# Patient Record
Sex: Female | Born: 1935 | Race: White | Hispanic: No | Smoking: Current every day smoker
Health system: Southern US, Community
[De-identification: ages and names within clinical notes are randomized; demographics above are authoritative.]

## PROBLEM LIST (undated history)

## (undated) DIAGNOSIS — I714 Abdominal aortic aneurysm, without rupture: Secondary | ICD-10-CM

## (undated) DIAGNOSIS — I872 Venous insufficiency (chronic) (peripheral): Secondary | ICD-10-CM

## (undated) DIAGNOSIS — Z9889 Other specified postprocedural states: Secondary | ICD-10-CM

## (undated) DIAGNOSIS — C9110 Chronic lymphocytic leukemia of B-cell type not having achieved remission: Secondary | ICD-10-CM

## (undated) DIAGNOSIS — I739 Peripheral vascular disease, unspecified: Secondary | ICD-10-CM

## (undated) DIAGNOSIS — I10 Essential (primary) hypertension: Secondary | ICD-10-CM

## (undated) DIAGNOSIS — D649 Anemia, unspecified: Secondary | ICD-10-CM

## (undated) DIAGNOSIS — C439 Malignant melanoma of skin, unspecified: Secondary | ICD-10-CM

## (undated) DIAGNOSIS — E785 Hyperlipidemia, unspecified: Secondary | ICD-10-CM

## (undated) DIAGNOSIS — I509 Heart failure, unspecified: Secondary | ICD-10-CM

## (undated) DIAGNOSIS — F419 Anxiety disorder, unspecified: Secondary | ICD-10-CM

## (undated) DIAGNOSIS — Z972 Presence of dental prosthetic device (complete) (partial): Secondary | ICD-10-CM

## (undated) DIAGNOSIS — G43909 Migraine, unspecified, not intractable, without status migrainosus: Secondary | ICD-10-CM

## (undated) HISTORY — DX: Anemia, unspecified: D64.9

## (undated) HISTORY — DX: Malignant melanoma of skin, unspecified: C43.9

## (undated) HISTORY — DX: Other specified postprocedural states: Z98.890

## (undated) HISTORY — DX: Venous insufficiency (chronic) (peripheral): I87.2

## (undated) HISTORY — DX: Heart failure, unspecified: I50.9

## (undated) HISTORY — DX: Anxiety disorder, unspecified: F41.9

## (undated) HISTORY — DX: Peripheral vascular disease, unspecified: I73.9

## (undated) HISTORY — DX: Essential (primary) hypertension: I10

## (undated) HISTORY — DX: Abdominal aortic aneurysm, without rupture: I71.4

## (undated) HISTORY — DX: Chronic lymphocytic leukemia of B-cell type not having achieved remission: C91.10

## (undated) HISTORY — DX: Hyperlipidemia, unspecified: E78.5

---

## 1958-05-17 HISTORY — PX: TONSILLECTOMY AND ADENOIDECTOMY: SUR1326

## 1996-05-17 HISTORY — PX: KNEE ARTHROSCOPY: SUR90

## 2000-05-17 LAB — HM COLONOSCOPY

## 2006-05-17 HISTORY — PX: CARDIOVASCULAR STRESS TEST: SHX262

## 2007-10-31 ENCOUNTER — Ambulatory Visit: Payer: Self-pay | Admitting: Family Medicine

## 2009-01-10 ENCOUNTER — Ambulatory Visit: Payer: Self-pay | Admitting: Family Medicine

## 2009-06-03 ENCOUNTER — Encounter: Payer: Self-pay | Admitting: Internal Medicine

## 2009-06-23 ENCOUNTER — Ambulatory Visit: Payer: Self-pay | Admitting: Internal Medicine

## 2009-06-23 DIAGNOSIS — R079 Chest pain, unspecified: Secondary | ICD-10-CM | POA: Insufficient documentation

## 2009-06-23 DIAGNOSIS — F39 Unspecified mood [affective] disorder: Secondary | ICD-10-CM | POA: Insufficient documentation

## 2009-06-23 DIAGNOSIS — F4321 Adjustment disorder with depressed mood: Secondary | ICD-10-CM

## 2009-06-23 DIAGNOSIS — I10 Essential (primary) hypertension: Secondary | ICD-10-CM | POA: Insufficient documentation

## 2009-06-23 DIAGNOSIS — E785 Hyperlipidemia, unspecified: Secondary | ICD-10-CM | POA: Insufficient documentation

## 2009-06-24 ENCOUNTER — Encounter: Payer: Self-pay | Admitting: Internal Medicine

## 2009-06-25 LAB — CONVERTED CEMR LAB
ALT: 19 units/L (ref 0–35)
Alkaline Phosphatase: 73 units/L (ref 39–117)
BUN: 16 mg/dL (ref 6–23)
Basophils Absolute: 0.5 10*3/uL — ABNORMAL HIGH (ref 0.0–0.1)
Bilirubin, Direct: 0.1 mg/dL (ref 0.0–0.3)
Cholesterol: 165 mg/dL (ref 0–200)
Creatinine, Ser: 1.1 mg/dL (ref 0.4–1.2)
Direct LDL: 95.4 mg/dL
Eosinophils Relative: 1.1 % (ref 0.0–5.0)
GFR calc non Af Amer: 51.7 mL/min (ref >60)
Glucose, Bld: 93 mg/dL (ref 70–99)
Lymphs Abs: 7.7 10*3/uL — ABNORMAL HIGH (ref 0.7–4.0)
Monocytes Absolute: 0.8 10*3/uL (ref 0.1–1.0)
Monocytes Relative: 5.9 % (ref 3.0–12.0)
Neutrophils Relative %: 31.4 % — ABNORMAL LOW (ref 43.0–77.0)
Platelets: 120 10*3/uL — ABNORMAL LOW (ref 150.0–400.0)
RDW: 13.3 % (ref 11.5–14.6)
Sodium: 139 meq/L (ref 135–145)
TSH: 2.37 microintl units/mL (ref 0.35–5.50)
Total Protein: 6.4 g/dL (ref 6.0–8.3)
VLDL: 48.6 mg/dL — ABNORMAL HIGH (ref 0.0–40.0)
WBC: 13.3 10*3/uL — ABNORMAL HIGH (ref 4.5–10.5)

## 2009-07-14 ENCOUNTER — Telehealth: Payer: Self-pay | Admitting: Internal Medicine

## 2009-07-25 ENCOUNTER — Ambulatory Visit: Payer: Self-pay | Admitting: Internal Medicine

## 2010-02-14 ENCOUNTER — Ambulatory Visit: Payer: Self-pay | Admitting: Internal Medicine

## 2010-02-27 ENCOUNTER — Encounter: Payer: Self-pay | Admitting: Internal Medicine

## 2010-02-27 ENCOUNTER — Inpatient Hospital Stay: Payer: Self-pay | Admitting: Internal Medicine

## 2010-02-27 ENCOUNTER — Ambulatory Visit: Payer: Self-pay | Admitting: Cardiovascular Disease

## 2010-03-02 ENCOUNTER — Ambulatory Visit: Payer: Self-pay | Admitting: Internal Medicine

## 2010-03-02 ENCOUNTER — Encounter: Payer: Self-pay | Admitting: Internal Medicine

## 2010-03-09 ENCOUNTER — Encounter (INDEPENDENT_AMBULATORY_CARE_PROVIDER_SITE_OTHER): Payer: Self-pay | Admitting: *Deleted

## 2010-03-17 HISTORY — PX: DOBUTAMINE STRESS ECHO: SHX5426

## 2010-03-25 ENCOUNTER — Telehealth (INDEPENDENT_AMBULATORY_CARE_PROVIDER_SITE_OTHER): Payer: Self-pay | Admitting: *Deleted

## 2010-03-26 ENCOUNTER — Encounter: Payer: Self-pay | Admitting: Internal Medicine

## 2010-03-26 ENCOUNTER — Ambulatory Visit: Payer: Self-pay

## 2010-03-26 ENCOUNTER — Ambulatory Visit (HOSPITAL_COMMUNITY)
Admission: RE | Admit: 2010-03-26 | Discharge: 2010-03-26 | Payer: Self-pay | Source: Home / Self Care | Admitting: Internal Medicine

## 2010-03-26 ENCOUNTER — Ambulatory Visit: Payer: Self-pay | Admitting: Cardiology

## 2010-03-27 ENCOUNTER — Telehealth: Payer: Self-pay | Admitting: Internal Medicine

## 2010-04-02 ENCOUNTER — Ambulatory Visit: Payer: Self-pay | Admitting: Internal Medicine

## 2010-04-02 DIAGNOSIS — I872 Venous insufficiency (chronic) (peripheral): Secondary | ICD-10-CM | POA: Insufficient documentation

## 2010-04-02 DIAGNOSIS — D649 Anemia, unspecified: Secondary | ICD-10-CM

## 2010-04-03 ENCOUNTER — Encounter: Payer: Self-pay | Admitting: Internal Medicine

## 2010-04-06 LAB — CONVERTED CEMR LAB
Albumin: 4.4 g/dL (ref 3.5–5.2)
BUN: 23 mg/dL (ref 6–23)
Chloride: 102 meq/L (ref 96–112)
Creatinine, Ser: 1.2 mg/dL (ref 0.4–1.2)
GFR calc non Af Amer: 48.53 mL/min (ref >60)
Glucose, Bld: 104 mg/dL — ABNORMAL HIGH (ref 70–99)
MCHC: 30.9 g/dL (ref 30.0–36.0)
Phosphorus: 3.7 mg/dL (ref 2.3–4.6)
RDW: 15.4 % (ref 11.5–15.5)

## 2010-05-06 ENCOUNTER — Encounter: Payer: Self-pay | Admitting: Internal Medicine

## 2010-05-06 ENCOUNTER — Ambulatory Visit: Payer: Self-pay | Admitting: Internal Medicine

## 2010-05-17 DIAGNOSIS — I714 Abdominal aortic aneurysm, without rupture, unspecified: Secondary | ICD-10-CM

## 2010-05-17 DIAGNOSIS — I739 Peripheral vascular disease, unspecified: Secondary | ICD-10-CM

## 2010-05-17 HISTORY — DX: Abdominal aortic aneurysm, without rupture, unspecified: I71.40

## 2010-05-17 HISTORY — DX: Abdominal aortic aneurysm, without rupture: I71.4

## 2010-05-17 HISTORY — DX: Peripheral vascular disease, unspecified: I73.9

## 2010-06-16 NOTE — Letter (Signed)
Summary: Appointment - [place], [place]  [address] [place]   [place], [place] [address]   Phone: [phone]  Fax: [phone]     March 09, 2010 MRN: [id number]   Tiffany Martinez [address] [place], [place]  [address]   Dear Ms. Joost,   Due to a change in our office schedule, your appointment on 03-18-10 at 11:30 must be changed to 03-26-10 at 2:00.  It is very important that we reach you to reschedule this appointment. We look forward to participating in your health care needs. Please contact us at the number listed above at your earliest convenience to reschedule this appointment.     Sincerely,  Designer, fashion/clothing

## 2010-06-16 NOTE — Miscellaneous (Signed)
Summary: Do Not Resuscitate Order  Do Not Resuscitate Order   Imported By: Virgia Land 06/24/2009 09:57:32  _____________________________________________________________________  External Attachment:    Type:   Image     Comment:   External Document

## 2010-06-16 NOTE — Progress Notes (Signed)
Summary: Stress Echo Pre-Procedure  Phone Note Outgoing Call Call back at [facility] Phone [phone]   Call placed by: Hubbard Robinson RN,  March 25, 2010 1:58 PM Call placed to: Patient Reason for Call: Confirm/change Appt Summary of Call: Stress Echo instructions given to patient.

## 2010-06-16 NOTE — Progress Notes (Signed)
Summary: requests alprazolam  Phone Note Call from Patient Call back at Home Phone [phone]   Caller: Patient Call For: Claris Gower MD Summary of Call: Pt states her husband passed away this weekend and she is asking if she can get a refill on alprazolam, she only wants 4 or 5.  [facility] [address]. Initial call taken by: Marty Heck CMA,  July 14, 2009 9:41 AM  Follow-up for Phone Call        okay to send Rx for #30 x 0 Follow-up by: Claris Gower MD,  July 14, 2009 10:33 AM  Additional Follow-up for Phone Call Additional follow up Details #1::        Rx Called In, Spoke with patient and advised results.  Additional Follow-up by: Edwin Dada CMA (AAMA),  July 14, 2009 10:45 AM    New/Updated Medications: ALPRAZOLAM 0.25 MG TABS (ALPRAZOLAM) 1 three times a day as needed for nerves Prescriptions: ALPRAZOLAM 0.25 MG TABS (ALPRAZOLAM) 1 three times a day as needed for nerves  #30 x 0   Entered by:   Edwin Dada CMA ([place])   Authorized by:   Claris Gower MD   Signed by:   Edwin Dada CMA ([place]) on 07/14/2009   Method used:   Telephoned to ...       [facility] [address] [phone]* (retail)       [address] [place], [place]  [id number]       Ph: [phone]       Fax: [phone]   [place]:   [phone]

## 2010-06-16 NOTE — Letter (Signed)
Summary: [facility] - Chest Pain  [facility] - Chest Pain   Imported By: Laural Benes 03/06/2010 15:27:38  _____________________________________________________________________  External Attachment:    Type:   Image     Comment:   External Document

## 2010-06-16 NOTE — Progress Notes (Signed)
Summary: Lab letter  Phone Note Call from Patient Call back at Home Phone [phone]   Caller: Patient Call For: Claris Gower MD Summary of Call: Pt called back to ask if Dr.Letvak could do another lab test that was done at [facility], per the pt she received a letter from [facility] that because of her abnormal labs they made her an appt with the cancer Dr.? pt was very upset because she never knew about this appt or abnormal labs, pt would like for Dr.Letvak to look over the labs from the hospital and advise if they need to be repeated? pt states her RBC & WBC were abnormal per letter from hospital. Pt has appt on 04/02/2010 with Dr.Letvak can those labs be repeated then? I also advised pt to bring letter. Initial call taken by: Edwin Dada CMA Deborra Medina),  March 27, 2010 9:49 AM  Follow-up for Phone Call        Please let her know there were mild abnormalities in the blood count We will repeat these and then decide if further evaluation is indicated Follow-up by: Claris Gower MD,  March 27, 2010 10:24 AM  Additional Follow-up for Phone Call Additional follow up Details #1::        left message on machine with results Additional Follow-up by: Edwin Dada CMA Deborra Medina),  March 27, 2010 12:36 PM

## 2010-06-16 NOTE — Assessment & Plan Note (Signed)
Summary: F/U/CLE   Vital Signs:  Patient profile:   77 year old female Weight:      161 pounds Temp:     98.0 degrees F oral Pulse rate:   64 / minute Pulse rhythm:   regular BP sitting:   130 / 60  (left arm) Cuff size:   regular  Vitals Entered By: Edwin Dada CMA Deborra Medina) (March 02, 2010 4:09 PM) CC: hospital follow-up   History of Present Illness: Seen in ER at [facility] last Tuesday Had chest pain and they wanted to admit him Dr Rockey Situ saw her there and let her go home  Only seems to be happening at night in bed occ in evening when relaxed in chair  Had gone to sleep and woke with worsening pressure in chest took a couple of aspirin and tried to relax Had some milder spells before this she was frightened so she drove herself to ER but the pain was gone by the time she got home Had CXR and labs WBC elevated, potassium low BP was very high also but then settled down  Just moved 3 weeks ago packed up house and moved to [facility] with lots of activity but didn't have pain then  Still grieving for husband hopes to have more socialization now   Allergies: No Known Drug Allergies  Past History:  Past medical, surgical, family and social histories (including risk factors) reviewed for relevance to current acute and chronic problems.  Past Medical History: Reviewed history from 06/23/2009 and no changes required. Anxiety Hyperlipidemia Hypertension  Past Surgical History: Reviewed history from 06/23/2009 and no changes required. Tonsillectomy/adenoids  1960 Right knee arthroscopy --1998 Stress test negative --2008  Family History: Reviewed history from 06/23/2009 and no changes required. Mom died of Altzheimers @96  Dad died @58  of pancreatic cancer 2 brothers with hypercholesterolemia No CAD, DM No breast cancer or colon cancer  Social History: Reviewed history from 07/25/2009 and no changes required. Widowed 2/11 -2nd for him , 1st for her. 4 step  sons. 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 Requests DNR--papers done Requests no feeding tube  Review of Systems       no heartburn No regurgitation does get heavy sweat with the spells  Physical Exam  General:  alert and normal appearance.   Neck:  supple, no masses, no thyromegaly, no carotid bruits, and no cervical lymphadenopathy.   Lungs:  normal respiratory effort, no intercostal retractions, no accessory muscle use, and normal breath sounds.   Heart:  normal rate, regular rhythm, no murmur, and no gallop.   Abdomen:  soft, non-tender, and no masses.   Pulses:  1+ in feet Extremities:  no edema Psych:  normally interactive, good eye contact, not anxious appearing, and not depressed appearing.     Impression & Recommendations:  Problem # 1:  CHEST PAIN (ICD-786.50) Assessment Comment Only  recurrent spells briefly admitted---records reveiwed atypical but worrisome with diaphoresis and left side pressure happening at rest doesn't seem to be reflux either  P: will check stress echo    no new meds     will try maalox and then NTG if recurs  Orders: EKG w/ Interpretation (93000) Echo Referral (Echo)  Problem # 2:  HYPERTENSION (ICD-401.9) Assessment: Unchanged potassium 3.2 in hospital  will add triamterene  The following medications were  removed from the medication list:    Hydrochlorothiazide 25 Mg Tabs (Hydrochlorothiazide) .Marland Kitchen... Take 1 by mouth once daily Her updated medication list for this problem includes:    Triamterene-hctz 37.5-25 Mg Tabs (Triamterene-hctz) .Marland Kitchen... 1 tab daily for high blood pressure  BP today: 130/60 Prior BP: 120/68 (07/25/2009)  Labs Reviewed: K+: 3.5 (06/23/2009) Creat: : 1.1 (06/23/2009)   Chol: 165 (06/23/2009)   HDL: 49.60 (06/23/2009)    TG: 243.0 (06/23/2009)  Complete Medication List: 1)  Triamterene-hctz 37.5-25 Mg Tabs (Triamterene-hctz) .Marland Kitchen.. 1 tab daily for high blood pressure 2)  Lipitor 20 Mg Tabs (Atorvastatin calcium) .... Take 1 by mouth once daily 3)  Aspirin 81 Mg Tabs (Aspirin) .... Take 1 by mouth once daily 4)  Alprazolam 0.25 Mg Tabs (Alprazolam) .Marland Kitchen.. 1 three times a day as needed for nerves 5)  Nitrostat 0.4 Mg Subl (Nitroglycerin) .... Use as directed  Patient Instructions: 1)  Please change the HCTZ to the combination med that has the triamterene in it 2)  Please set up stress echocardiogram 3)  Please schedule a follow-up appointment in 1 month.  Prescriptions: TRIAMTERENE-HCTZ 37.5-25 MG TABS (TRIAMTERENE-HCTZ) 1 tab daily for high blood pressure  #30 x 12   Entered and Authorized by:   Claris Gower MD   Signed by:   Claris Gower MD on 03/02/2010   Method used:   Electronically to        [facility]. [address] [phone]* (retail)       [address] [place], [place]  [id number]       Ph: [phone]       Fax: [phone]   RxID:   [id number]    Orders Added: 1)  Est. Patient Level IV [id number] 2)  EKG w/ Interpretation [93000] 3)  Echo Referral [Echo]    Current Allergies (reviewed today): No known allergies

## 2010-06-16 NOTE — Assessment & Plan Note (Signed)
Summary: 2:00 NEW PT TO EST/[place]/CLE   Vital Signs:  Patient profile:   77 year old female Height:      64.5 inches Weight:      163 pounds BMI:     27.65 Temp:     97.8 degrees F oral Pulse rate:   60 / minute Pulse rhythm:   regular BP sitting:   130 / 60  (left arm) Cuff size:   regular  Vitals Entered By: Edwin Dada CMA Deborra Medina) (June 23, 2009 2:02 PM) CC: new patient to establish care   History of Present Illness: Eatablishing here--tranferring since I go to [place] her recently when assuming her husband's care at [facility]  Has had stress with her care for him  Now in facility but still stressed due to his trouble acclimating She is having a hard time with this Has lost all her responsibilites Really appreciative of the good care of staff but finds herself crying a lot No history of depression in past Appetite is off has some degree of anhedonia Got some xanax from Dr Lovie Macadamia but not really helping Feels realistic about his decline having trouble taking care of taxes and other financial issues Plans to move to smaller, sunnier unit at [place] with step sons They don't really realize what is going on with him or what is required for his care  HTN diagnosed in past few years very labile with stress Has been getting tightening in chest --always while lying in bed. Right side Did stress test from Dr Lovie Macadamia about 2 yearss ago---negative  Hypercholesterolemia for some years on lipitor   Preventive Screening-Counseling & Management  Alcohol-Tobacco     Smoking Status: current  Allergies (verified): No Known Drug Allergies  Past History:  Past Medical History: Anxiety Hyperlipidemia Hypertension  Past Surgical History: Tonsillectomy/adenoids  1960 Right knee arthroscopy --August 31, 1996 Stress test negative --2006/09/01  Family History: Mom died of Altzheimers @96  Dad died @58  of pancreatic cancer 2 brothers with  hypercholesterolemia No CAD, DM No breast cancer or colon cancer  Social History: Married--2nd for him , 1st for her. 4 sons. 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 Requests DNR--papers done Requests no feeding tubeSmoking Status:  current  Review of Systems General:  generally sleeping okay wears seat belt appetite isn't great--weight seems to be stable or has lost a little. Eyes:  Denies double vision and vision loss-1 eye. ENT:  Denies decreased hearing and ringing in ears; full dentures. CV:  See HPI; Complains of chest pain or discomfort; denies fainting, lightheadness, palpitations, shortness of breath with exertion, and swelling of feet. Resp:  Denies cough and shortness of breath. GI:  Denies abdominal pain, bloody stools, change in bowel habits, dark tarry stools, indigestion, nausea, and vomiting. GU:  Denies dysuria and incontinence. MS:  Complains of joint pain; denies joint swelling; occ knee aching--no meds. Derm:  Complains of lesion(s); denies rash; has lump on back of neck does see dermatologists. Neuro:  Denies headaches, numbness, tingling, and weakness. Psych:  See HPI; Complains of anxiety and depression. Heme:  Denies abnormal bruising and enlarge lymph nodes.  Physical Exam  General:  alert and normal appearance.   Head:  normocephalic and atraumatic.   Eyes:  pupils equal, pupils round, and pupils reactive to  light.   Mouth:  no erythema and no exudates.   Full dentures in  Neck:  supple, no masses, no thyromegaly, no carotid bruits, and no cervical lymphadenopathy.   Lungs:  normal respiratory effort, no intercostal retractions, no accessory muscle use, and normal breath sounds.   Heart:  normal rate, regular rhythm, no murmur, and no gallop.   Abdomen:  soft,  non-tender, and no masses.   Msk:  no joint tenderness and no joint swelling.   Pulses:  2+ in feet Extremities:  no edema Neurologic:  alert & oriented X3, strength normal in all extremities, and gait normal.   Skin:  no rashes, no suspicious lesions, and no ulcerations.  Small freely moveable cyst over cervical spine Axillary Nodes:  No palpable lymphadenopathy Psych:  normally interactive, good eye contact, not anxious appearing, and dysphoric affect.     Impression & Recommendations:  Problem # 1:  ADJUSTMENT DISORDER WITH DEPRESSED MOOD (ICD-309.0) Assessment New clearly with some depression seems  to be improving to some extent---has started cooking and painting some---but still not socially engaged  P: will give lorazepam for occ use when very stressed    see back in 1 month--consider SSRI if not improving then (or buproprion if not able to stay away from cigarettes)  Problem # 2:  CHEST PAIN (ICD-786.50) Assessment: Comment Only  occ squeezing--but not exertional may be stress related no change since before her stress test which was reassuring  Orders: EKG w/ Interpretation (93000) TLB-Renal Function Panel (80069-RENAL) TLB-CBC Platelet - w/Differential (85025-CBCD) TLB-TSH (Thyroid Stimulating Hormone) (84443-TSH) Venipuncture [id number])  Problem # 3:  HYPERTENSION (ICD-401.9) Assessment: Unchanged will check labs no change needed  Her updated medication list for this problem includes:    Hydrochlorothiazide 25 Mg Tabs (Hydrochlorothiazide) .Marland Kitchen... Take 1 by mouth once daily  BP today: 130/60  Problem # 4:  HYPERLIPIDEMIA (ICD-272.4) Assessment: Unchanged  discussed the uncertainty about primary prevention will check levels and review this at subsequent visits  Her updated medication list for this problem includes:    Lipitor 20 Mg Tabs (Atorvastatin calcium) .Marland Kitchen... Take 1 by mouth once daily  Orders: TLB-Lipid Panel (80061-LIPID) TLB-Hepatic/Liver Function  Pnl (80076-HEPATIC)  Complete Medication List: 1)  Hydrochlorothiazide 25 Mg Tabs (Hydrochlorothiazide) .... Take 1 by mouth once daily 2)  Lipitor 20 Mg Tabs (Atorvastatin calcium) .... Take 1 by mouth once daily 3)  Aspirin 81 Mg Tabs (Aspirin) .... Take 1 by mouth once daily 4)  Alprazolam 0.25 Mg Tabs (Alprazolam) .... As needed  Patient Instructions: 1)  Please get last 3 years of records from [facility] 2)  Please schedule a follow-up appointment in 1 month.   Current Allergies (reviewed today): No known allergies    Tetanus/Td Immunization History:    Tetanus/Td # 1:  Historical (05/18/2003)  Other Immunization History:    Zostavax # 1:  Zostavax (05/17/2008)   Colonoscopy  Procedure date:  05/17/2000  Findings:      had some benign polyps may have been adenomatous  EKG  Procedure date:  06/23/2009  Findings:      sinus @61  normal variant

## 2010-06-16 NOTE — Letter (Signed)
Summary: Records Dated 06-28-06 thru 09-24-08/[facility]  Records Dated 06-28-06 thru 09-24-08/[facility]   Imported By: Edmonia James 07/22/2009 09:53:03  _____________________________________________________________________  External Attachment:    Type:   Image     Comment:   External Document

## 2010-06-16 NOTE — Assessment & Plan Note (Signed)
Summary: 1 MONTH FOLLOW UP/[facility]   Vital Signs:  Patient profile:   77 year old female Weight:      163 pounds Temp:     97.7 degrees F oral Pulse rate:   64 / minute Pulse rhythm:   regular BP sitting:   120 / 68  (left arm) Cuff size:   regular  Vitals Entered By: Edwin Dada CMA Deborra Medina) (July 25, 2009 10:11 AM) CC: 1 month follow-up   History of Present Illness: Husband died in past few weeks Funeral went okay did use the tranquilizer for this  BP has been okay Enjoying some quiet and peace now Working through finances, etc with youngest step son they are working through the process--have stopped with the recriminations  Had relatives affected by earthquake in [place] 2 nieces were killed she is working through this as well   Allergies: No Known Drug Allergies  Past History:  Past medical, surgical, family and social histories (including risk factors) reviewed for relevance to current acute and chronic problems.  Past Medical History: Reviewed history from 06/23/2009 and no changes required. Anxiety Hyperlipidemia Hypertension  Past Surgical History: Reviewed history from 06/23/2009 and no changes required. Tonsillectomy/adenoids  1960 Right knee arthroscopy --1998 Stress test negative --2008  Family History: Reviewed history from 06/23/2009 and no changes required. Mom died of Altzheimers @96  Dad died @58  of pancreatic cancer 2 brothers with hypercholesterolemia No CAD, DM No breast cancer or colon cancer  Social History: Widowed 2/11 -2nd for him , 1st for her. 4 step sons. 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 Requests DNR--papers done Requests no feeding tube  Review of Systems       still trouble initiating sleep uses hot bath and occ exedrin  PM weight stable   Impression & Recommendations:  Problem # 1:  ADJUSTMENT DISORDER WITH DEPRESSED MOOD (ICD-309.0) Assessment Improved has dealt with his death okay counselled for all of 30 minute visit  Problem # 2:  HYPERTENSION (ICD-401.9) Assessment: Unchanged good control no changes needed  Her updated medication list for this problem includes:    Hydrochlorothiazide 25 Mg Tabs (Hydrochlorothiazide) .Marland Kitchen... Take 1 by mouth once daily  BP today: 120/68 Prior BP: 130/60 (06/23/2009)  Labs Reviewed: K+: 3.5 (06/23/2009) Creat: : 1.1 (06/23/2009)   Chol: 165 (06/23/2009)   HDL: 49.60 (06/23/2009)   TG: 243.0 (06/23/2009)  Problem # 3:  HYPERLIPIDEMIA (ICD-272.4) Assessment: Unchanged good control no changes  Her updated medication list for this problem includes:    Lipitor 20 Mg Tabs (Atorvastatin calcium) .Marland Kitchen... Take 1 by mouth once daily  Labs Reviewed: SGOT: 23 (06/23/2009)   SGPT: 19 (06/23/2009)   HDL:49.60 (06/23/2009)  Chol:165 (06/23/2009)  Trig:243.0 (06/23/2009)  Complete Medication List: 1)  Hydrochlorothiazide 25 Mg Tabs (Hydrochlorothiazide) .... Take 1 by mouth once daily 2)  Lipitor 20 Mg Tabs (Atorvastatin calcium) .... Take 1 by mouth once daily 3)  Aspirin 81 Mg Tabs (Aspirin) .... Take 1 by mouth once daily 4)  Alprazolam 0.25 Mg Tabs (Alprazolam) .Marland Kitchen.. 1 three times a day as needed for nerves  Patient Instructions: 1)  Please schedule a follow-up appointment in 3 months .   Current Allergies (reviewed today): No known allergies    Immunization History:  Tetanus/Td Immunization History:    Tetanus/Td:  Historical (05/18/2003)  Pneumovax Immunization  History:    Pneumovax:  Historical (05/18/2003)  Zostavax History:    Zostavax # 1:  Zostavax (08/30/2008)

## 2010-06-16 NOTE — Miscellaneous (Signed)
Summary: Declaration of a Desire for a Natural Death  Declaration of a Desire for a Natural Death   Imported By: Virgia Land 06/24/2009 09:56:28  _____________________________________________________________________  External Attachment:    Type:   Image     Comment:   External Document

## 2010-06-16 NOTE — Miscellaneous (Signed)
Summary: Health Care Power of [place] of Attorney   Imported By: Virgia Land 06/24/2009 09:58:29  _____________________________________________________________________  External Attachment:    Type:   Image     Comment:   External Document

## 2010-06-16 NOTE — Assessment & Plan Note (Signed)
Summary: ONE MTH FOLLOW UP / LFW   Vital Signs:  Patient profile:   77 year old female Weight:      162 pounds BMI:     27.48 Temp:     97.6 degrees F oral BP sitting:   128 / 62  (left arm) Cuff size:   regular  Vitals Entered By: Edwin Dada CMA Deborra [place]) (April 02, 2010 2:38 PM) CC: 1 month follow-up   History of Present Illness: Did great on the stress echocardiogram Had good exercise tolerance and negative exam  Has not had any pain since ER visit No sig heartburn problems May have been a muscle spasm process  Adjusting well to new [place]--even throwing a party for some birthdays still "takes a long time getting used to being on your own"  Decided to defer hematology eval mildly low platelets and hgb has history of clustering if she has heparin in sample though Chronic anemia  Allergies: No Known Drug Allergies  Past History:  Past medical, surgical, family and social histories (including risk factors) reviewed for relevance to current acute and chronic problems.  Past Medical History: Anxiety Hyperlipidemia Hypertension Anemia-NOS Chronic venous insufficiency  Past Surgical History: Tonsillectomy/adenoids  1960 Right knee arthroscopy --1998 Stress test negative --2008 Stress echo negative 11/11  Family History: Reviewed history from 06/23/2009 and no changes required. Mom died of Altzheimers @96  Dad died @58  of pancreatic cancer 2 brothers with hypercholesterolemia No CAD, DM No breast cancer or colon cancer  Social History: Reviewed history from 07/25/2009 and no changes required. Widowed 2/11 -2nd for him , 1st for her. 4 step sons. 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 Requests DNR--papers  done Requests no feeding tube  Review of Systems       eating well weight is stable right calf still turns purplish by the end of the day. No sig pain  Physical Exam  General:  alert and normal appearance.   Neck:  supple, no masses, no thyromegaly, and no cervical lymphadenopathy.   Lungs:  normal respiratory effort, no intercostal retractions, no accessory muscle use, and normal breath sounds.   Heart:  normal rate, regular rhythm, no murmur, and no gallop.   Extremities:  trace edema right ankle mild venous  dilatation Psych:  normally interactive, good eye contact, not anxious appearing, and not depressed appearing.   teary when speaking about husband   Impression & Recommendations:  Problem # 1:  CHEST PAIN (ICD-786.50) Assessment Improved  resolved stress test reassuring no recurrent symptoms  Orders: Venipuncture [id number])  Problem # 2:  ANEMIA-NOS (R2654735.9) Assessment: Comment Only  chronic with mildly low platelets  P: will recheck    referral only if worsens  Orders: TLB-CBC Platelet - w/Differential (85025-CBCD) Venipuncture [id number])  Problem # 3:  HYPERTENSION (ICD-401.9) Assessment: Unchanged  good control  Her updated medication list for this problem includes:    Triamterene-hctz 37.5-25 Mg Tabs (Triamterene-hctz) .Marland Kitchen... 1 tab daily for high blood pressure  BP today: 128/62 Prior BP: 130/60 (03/02/2010)  Labs Reviewed: K+: 3.5 (06/23/2009) Creat: : 1.1 (06/23/2009)   Chol: 165 (06/23/2009)   HDL: 49.60 (06/23/2009)   TG: 243.0 (06/23/2009)  Orders: TLB-Renal Function Panel (80069-RENAL) Venipuncture [id number])  Complete Medication List: 1)  Triamterene-hctz 37.5-25 Mg Tabs (Triamterene-hctz) .Marland KitchenMarland KitchenMarland Kitchen 1  tab daily for high blood pressure 2)  Lipitor 20 Mg Tabs (Atorvastatin calcium) .... Take 1 by mouth once daily 3)  Aspirin 81 Mg Tabs (Aspirin) .... Take 1 by mouth once daily 4)  Alprazolam 0.25 Mg Tabs (Alprazolam) .Marland Kitchen.. 1 three times a day as  needed for nerves 5)  Nitrostat 0.4 Mg Subl (Nitroglycerin) .... Use as directed  Patient Instructions: 1)  Please schedule a follow-up appointment in 6 months .    Orders Added: 1)  TLB-CBC Platelet - w/Differential [85025-CBCD] 2)  TLB-Renal Function Panel [80069-RENAL] 3)  Venipuncture [id number]    Current Allergies (reviewed today): No known allergies   Appended Document: ONE MTH FOLLOW UP / LFW    Clinical Lists Changes  Orders: Added new Service order of Flu Vaccine 37yrs + MEDICARE PATIENTS [id number]) - Signed Added new Service order of Administration Flu vaccine - MCR [id number]) - Signed Observations: Added new observation of FLU VAX VIS: 12/09/2009 version (04/02/2010 15:34) Added new observation of FLU VAXLOT: [id number] (04/02/2010 15:34) Added new observation of FLU VAXMFR: Glaxosmithkline (04/02/2010 15:34) Added new observation of FLU VAX EXP: 11/14/2010 (04/02/2010 15:34) Added new observation of FLU VAX DSE: 0.80ml (04/02/2010 15:34) Added new observation of FLU VAX: Fluvax 3+ (04/02/2010 15:34) Flu Vaccine Consent Questions     Do you have a history of severe allergic reactions to this vaccine? no    Any prior history of allergic reactions to egg and/or gelatin? no    Do you have a sensitivity to the preservative Thimersol? no    Do you have a past history of Guillan-Barre Syndrome? no    Do you currently have an acute febrile illness? no    Have you ever had a severe reaction to latex? no    Vaccine information given and explained to patient? yes    Are you currently pregnant? no    Lot Number:[id number]   Exp Date:11/14/2010   Site Given  Right Deltoid IM   .[email or web address]

## 2010-06-16 NOTE — Letter (Signed)
Summary: [facility]   Imported By: Sallee Provencal 03/04/2010 17:11:00  _____________________________________________________________________  External Attachment:    Type:   Image     Comment:   External Document

## 2010-06-16 NOTE — Miscellaneous (Signed)
Summary: Appointment Canceled  Appointment status changed to canceled by [employer] on 03/09/2010 2:08 PM.  Cancellation Comments --------------------- ecs/cp/insur:medicare/tricare-mb  Appointment Information ----------------------- Appt Type:  CARDIOLOGY ANCILLARY VISIT      Date:  Wednesday, March 18, 2010      Time:  11:30 AM for 60 min   Urgency:  Routine   Made By:  Wynelle Cleveland  To Visit:  [id number]    Reason:  ecs/cp/insur:medicare/tricare-mb  Appt Comments ------------- -- 03/09/10 14:08: ([facility]) CANCELED -- ecs/cp/insur:medicare/tricare-mb -- 03/03/10 16:40: ([facility]) BOOKED -- Routine CARDIOLOGY ANCILLARY VISIT at 03/18/2010 11:30 AM for 60 min ecs/cp/insur:medicare/tricare-mb

## 2010-06-18 NOTE — Assessment & Plan Note (Signed)
Summary: CONGESTION/ lb   Vital Signs:  Patient profile:   77 year old female Weight:      164 pounds O2 Sat:      99 % on Room air Temp:     97.7 degrees F oral Pulse rate:   68 / minute Pulse rhythm:   regular Resp:     14 per minute BP sitting:   128 / 70  (left arm) Cuff size:   regular  Vitals Entered By: Edwin Dada CMA Deborra [place]) (May 06, 2010 12:25 PM)  O2 Flow:  Room air CC: congestion   History of Present Illness: Has been sick had to spend most of the day in bed yesterday Had mild cold starting about 3 weeks ago Felt she had gotten better but now comes back intermittently seems to be down in her chest She is fatiguing easier  Intermittent fever SOme hot sensation at night, then resolves  Lots of cough---clear sputum some nasal congestion and post nasal drip Some DOE easier--like on her daily walks  No ear pain some soreness in neck glands and posterior neck Slight sore throat  tried mucinex--may have helped some  Allergies: No Known Drug Allergies  Past History:  Past medical, surgical, family and social histories (including risk factors) reviewed for relevance to current acute and chronic problems.  Past Medical History: Reviewed history from 04/02/2010 and no changes required. Anxiety Hyperlipidemia Hypertension Anemia-NOS Chronic venous insufficiency  Past Surgical History: Reviewed history from 04/02/2010 and no changes required. Tonsillectomy/adenoids  1960 Right knee arthroscopy --1998 Stress test negative --2008 Stress echo negative 11/11  Family History: Reviewed history from 06/23/2009 and no changes required. Mom died of Altzheimers @96  Dad died @58  of pancreatic cancer 2 brothers with hypercholesterolemia No CAD, DM No breast cancer or colon cancer  Social History: Reviewed history from 07/25/2009 and no changes required. Widowed 2/11 -2nd for him , 1st for her. 4 step sons. 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 Requests DNR--papers done Requests no feeding tube  Review of Systems       No vomiting or diarrhea appetite is okay  Physical Exam  General:  alert.  NAD Head:  no sinus tenderness Ears:  R ear normal and L ear normal.   Nose:  mild congestion and inflammation Mouth:  slight pharyngeal injection without exudates Neck:  supple and no masses.  ?? slight non-tender ant cervical nodes Lungs:  normal respiratory effort, no intercostal retractions, no accessory muscle use, normal breath sounds, no crackles, and no wheezes.     Impression & Recommendations:  Problem # 1:  BRONCHITIS- ACUTE (ICD-466.0) Assessment New  variable sick feeling better today could be viral or atypical infection advised to use analgesics z-pak if worsens  Her updated medication list for this problem includes:    Azithromycin 250 Mg Tabs (Azithromycin) .Marland Kitchen... 2 tabs today, then 1 tab daily for the next 4 days for bronchitis  Complete Medication List: 1)  Triamterene-hctz 37.5-25 Mg Tabs (Triamterene-hctz) .Marland Kitchen.. 1 tab daily for high blood pressure 2)  Lipitor 20 Mg Tabs (Atorvastatin calcium) .... Take 1 by mouth once daily 3)  Aspirin 81 Mg Tabs (Aspirin) .... Take 1 by mouth once daily 4)  Alprazolam 0.25 Mg Tabs (Alprazolam) .Marland Kitchen.. 1 three times a day as needed for nerves 5)  Nitrostat 0.4  Mg Subl (Nitroglycerin) .... Use as directed 6)  Azithromycin 250 Mg Tabs (Azithromycin) .... 2 tabs today, then 1 tab daily for the next 4 days for bronchitis  Patient Instructions: 1)  Please start the azithromycin if you get worse 2)  Please keep your regular appt Prescriptions: AZITHROMYCIN 250 MG TABS (AZITHROMYCIN) 2 tabs today, then 1 tab daily for the next 4 days for bronchitis  #6 x 0   Entered and Authorized by:    Claris Gower MD   Signed by:   Claris Gower MD on 05/06/2010   Method used:   Print then Give to Patient   RxID:   [id number]    Orders Added: 1)  Est. Patient Level III [id number]    Current Allergies (reviewed today): No known allergies

## 2010-09-25 ENCOUNTER — Encounter: Payer: Self-pay | Admitting: Internal Medicine

## 2010-09-28 ENCOUNTER — Encounter: Payer: Self-pay | Admitting: Internal Medicine

## 2010-09-28 ENCOUNTER — Ambulatory Visit (INDEPENDENT_AMBULATORY_CARE_PROVIDER_SITE_OTHER): Payer: Medicare Other | Admitting: Internal Medicine

## 2010-09-28 VITALS — BP 138/80 | HR 61 | Temp 97.7°F | Ht 64.0 in | Wt 168.0 lb

## 2010-09-28 DIAGNOSIS — F4321 Adjustment disorder with depressed mood: Secondary | ICD-10-CM

## 2010-09-28 DIAGNOSIS — Z85828 Personal history of other malignant neoplasm of skin: Secondary | ICD-10-CM

## 2010-09-28 DIAGNOSIS — D649 Anemia, unspecified: Secondary | ICD-10-CM

## 2010-09-28 DIAGNOSIS — I10 Essential (primary) hypertension: Secondary | ICD-10-CM

## 2010-09-28 DIAGNOSIS — F411 Generalized anxiety disorder: Secondary | ICD-10-CM

## 2010-09-28 DIAGNOSIS — E785 Hyperlipidemia, unspecified: Secondary | ICD-10-CM

## 2010-09-28 NOTE — Progress Notes (Signed)
Subjective:    Patient ID: Tiffany Martinez, female    DOB: 08-Nov-1933, 77 y.o.   MRN: [id number]  HPI Doing fine Has adjusted to losing husband but "I have no desire to repeat the last year" Seems to be coming back to herself Really was affected by stepson's nasty comments when he was sick  Has trip to [place] in August Really looking forward to it  Really happy with her place at [facility] Enjoys flowers on the deck, and [place] PIcking and choosing her activities Will continue volunteering for [employer] on Whe[employer]  Has had slight recurrences of chest pressure No ill feelings, diaphoresis or arm pain Usually when lying down at night No heartburn Fades quickly with just breathing slowly  No change in exercise tolerance No sig edema but does have some varicose veins  Current outpatient prescriptions:ALPRAZolam (XANAX) 0.25 MG tablet, Take 0.25 mg by mouth 3 (three) times daily as needed.  , Disp: , Rfl: ;  aspirin 81 MG tablet, Take 81 mg by mouth daily.  , Disp: , Rfl: ;  atorvastatin (LIPITOR) 20 MG tablet, Take 20 mg by mouth daily.  , Disp: , Rfl: ;  nitroGLYCERIN (NITROSTAT) 0.4 MG SL tablet, Place 0.4 mg under the tongue every 5 (five) minutes as needed.  , Disp: , Rfl:  triamterene-hydrochlorothiazide (MAXZIDE-25) 37.5-25 MG per tablet, Take 1 tablet by mouth daily.  , Disp: , Rfl:   Past Medical History  Diagnosis Date  . Anxiety   . Hyperlipidemia   . Hypertension   . Anemia   . Chronic venous insufficiency     Past Surgical History  Procedure Date  . Tonsillectomy and adenoidectomy 1960  . Knee arthroscopy 1998    right  . Cardiovascular stress test 2008    negative  . Dobutamine stress echo 11/11    negative    Family History  Problem Relation Age of Onset  . Alzheimer's disease Mother   . Pancreatic cancer Father   . Coronary artery disease Neg Hx   . Diabetes Neg Hx   . Cancer Neg Hx     breast or colon    History   Social  History  . Marital Status: Widowed    Spouse Name: N/A    Number of Children: 4  . Years of Education: N/A   Occupational History  . retired- Therapist, sports,     Social History Main Topics  . Smoking status: Current Some Day Smoker  . Smokeless tobacco: Not on file  . Alcohol Use: Yes     regular wine with dinner  . Drug Use: Not on file  . Sexually Active: Not on file   Other Topics Concern  . Not on file   Social History Narrative   Widowed 2/11 -2nd for him , 1st for her. 4 step sons. 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 No distress.  Neck: Normal range of motion. Neck supple.  Cardiovascular: Normal rate, regular rhythm, normal heart sounds and intact distal pulses.  Exam reveals no gallop.   No murmur heard. Pulmonary/Chest: Effort normal and breath sounds normal. No respiratory distress. She has no wheezes. She  has no rales.  Musculoskeletal: Normal range of motion. She exhibits no edema and no tenderness.  Lymphadenopathy:    She has no cervical adenopathy.  Psychiatric: She has a normal mood and affect. Her behavior is normal. Judgment and thought content normal.          Assessment & Plan:

## 2010-09-28 NOTE — Patient Instructions (Signed)
Please set up appointment with dermatologist

## 2010-10-01 ENCOUNTER — Ambulatory Visit: Payer: TRICARE For Life (TFL) | Admitting: Internal Medicine

## 2010-10-01 LAB — HM MAMMOGRAPHY: HM Mammogram: NORMAL

## 2010-10-06 ENCOUNTER — Encounter: Payer: Self-pay | Admitting: *Deleted

## 2010-10-09 ENCOUNTER — Encounter: Payer: Self-pay | Admitting: Internal Medicine

## 2010-10-27 ENCOUNTER — Telehealth: Payer: Self-pay | Admitting: *Deleted

## 2010-10-27 NOTE — Telephone Encounter (Signed)
Patient is asking if you could call her. 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

## 2010-10-27 NOTE — Telephone Encounter (Signed)
Message left Will try again tomorrow

## 2010-10-28 NOTE — Telephone Encounter (Signed)
Discussed her concerns Dr Tyler Deis plans to do the wide excision herself----apparently it is only .17mm Getting it done next week

## 2010-11-09 HISTORY — PX: MELANOMA EXCISION: SHX5266

## 2010-12-10 ENCOUNTER — Ambulatory Visit (INDEPENDENT_AMBULATORY_CARE_PROVIDER_SITE_OTHER): Payer: Medicare Other | Admitting: Family Medicine

## 2010-12-10 ENCOUNTER — Encounter: Payer: Self-pay | Admitting: Family Medicine

## 2010-12-10 VITALS — BP 122/70 | HR 76 | Temp 98.1°F | Wt 165.2 lb

## 2010-12-10 DIAGNOSIS — M79604 Pain in right leg: Secondary | ICD-10-CM

## 2010-12-10 DIAGNOSIS — M79609 Pain in unspecified limb: Secondary | ICD-10-CM

## 2010-12-10 NOTE — Patient Instructions (Signed)
Blood work today to check on things. Pass by marion's office to schedule ABIs preferably at [facility] if able. We will check for arterial insufficiency. If not improving, we may obtain ultrasound, although not looking like this today. May try anti inflammatory as well for this over the counter.

## 2010-12-10 NOTE — Assessment & Plan Note (Addendum)
Actual leg "heaviness" not pain necessarily. Calves measuring similar bilaterally, does smoke but no other risk factors for DVT. Good pulses bilaterally however story of worse at certain distance concerning for claudication in this 77yo with h/o HTN, HLD so set up with ABIs. Check CK and K to eval am leg cramps. Treat with NSAADs prior to walking. Will update with results.

## 2010-12-10 NOTE — Progress Notes (Signed)
  Subjective:    Patient ID: Tiffany Martinez, female    DOB: [birth year], 77 y.o.   MRN: [id number]  HPI CC: R leg heaviness  Planning trip to [place] in 3 wks, recently noticing having trouble walking.  Able to walk for 300 yrds, then right leg feels "heavy".  Worse with walking uphill or up stairs.  No pain or swelling, just "heavy".  This has been going on for 3-4 wks now.  Decided to come in when went to mall this weekend and had trouble going from one store to next.  Also noticing cramping in am when awakens.  Heaviness anterior of leg from thigh to ankle.  No pain in calves.  No recent immobility, travel, hormonal medicines, no personal or family history of blood clots.  On lipitor 20mg , wonders if this is causing sxs.   Had bilateral leg veins removed 40 yrs ago. Had orthoscopic surgery on R meniscus years ago as well. + smoker. No h/o back problems.  H/o HLD, CVI  Review of Systems Per HPI    Objective:   Physical Exam  Nursing note and vitals reviewed. Constitutional: She appears well-developed and well-nourished. No distress.  HENT:  Head: Normocephalic and atraumatic.  Mouth/Throat: Oropharynx is clear and moist. No oropharyngeal exudate.  Eyes: Conjunctivae and EOM are normal. Pupils are equal, round, and reactive to light. No scleral icterus.  Neck: Normal range of motion. Neck supple.  Cardiovascular: Normal rate, regular rhythm, normal heart sounds and intact distal pulses.   No murmur heard. Pulses:      Dorsalis pedis pulses are 1+ on the right side, and 1+ on the left side.       Posterior tibial pulses are 1+ on the right side, and 1+ on the left side.  Pulmonary/Chest: Effort normal and breath sounds normal. No respiratory distress. She has no wheezes. She has no rales.  Musculoskeletal: Normal range of motion. She exhibits no edema and no tenderness.       Right hip: Normal.       Left hip: Normal.       Right knee: Normal.       Left knee: Normal.   No pain with int/ext rotation at hip. No calf pain or swelling appreciated. No GTB pain. L calf measures 39.5cm R calf measures 40cm  Skin: Skin is warm and dry. No rash noted.  Psychiatric: She has a normal mood and affect.          Assessment & Plan:

## 2010-12-11 LAB — BASIC METABOLIC PANEL
Chloride: 104 mEq/L (ref 96–112)
Creatinine, Ser: 0.9 mg/dL (ref 0.4–1.2)
Sodium: 140 mEq/L (ref 135–145)

## 2010-12-17 ENCOUNTER — Telehealth: Payer: Self-pay | Admitting: *Deleted

## 2010-12-17 NOTE — Telephone Encounter (Signed)
Pt is asking if you will agree to be her health care POA.  She needs to know so that she can update her will within the next 2 weeks, she is going out of the country in 2 weeks and needs to know before then.  Please advise.  She says she has no one else to ask in this country.

## 2010-12-18 ENCOUNTER — Encounter (INDEPENDENT_AMBULATORY_CARE_PROVIDER_SITE_OTHER): Payer: Medicare Other | Admitting: *Deleted

## 2010-12-18 DIAGNOSIS — M79604 Pain in right leg: Secondary | ICD-10-CM

## 2010-12-18 DIAGNOSIS — I70219 Atherosclerosis of native arteries of extremities with intermittent claudication, unspecified extremity: Secondary | ICD-10-CM

## 2010-12-18 DIAGNOSIS — I739 Peripheral vascular disease, unspecified: Secondary | ICD-10-CM

## 2010-12-19 NOTE — Telephone Encounter (Signed)
This is not usually done as it could pose a conflict of interest I could probably do this for her but would need to speak to her about her wishes, etc She should try to find someone else who could support her medical desires that is not her physician though

## 2010-12-21 ENCOUNTER — Encounter: Payer: Self-pay | Admitting: Family Medicine

## 2010-12-21 ENCOUNTER — Telehealth: Payer: Self-pay | Admitting: Family Medicine

## 2010-12-21 ENCOUNTER — Telehealth: Payer: Self-pay | Admitting: *Deleted

## 2010-12-21 DIAGNOSIS — I739 Peripheral vascular disease, unspecified: Secondary | ICD-10-CM

## 2010-12-21 DIAGNOSIS — F17200 Nicotine dependence, unspecified, uncomplicated: Secondary | ICD-10-CM

## 2010-12-21 NOTE — Telephone Encounter (Signed)
Pt is calling about her LEA/ABI results she had done on 12/18/10. Please advise.

## 2010-12-21 NOTE — Telephone Encounter (Signed)
Note continued from below.  Pt states she is facing aneurysm surgery- says she was dx'd with an aneurysm after scans on Friday.  She is waiting to hear back from Dr. Burt Knack, and will then call to let you know what is going on.

## 2010-12-21 NOTE — Telephone Encounter (Signed)
I saw that in her records We will discuss this at our next visit

## 2010-12-21 NOTE — Telephone Encounter (Signed)
Advised pt, she says her lawyer told her it would not be a conflict of interest for you to be her POA.  She says she would like to come in for a visit to discuss, but, right now she is facing surgery for

## 2010-12-21 NOTE — Telephone Encounter (Signed)
I had asked to set up referral for this patient to cards last week after prelim scan returned but don't see where visit is scheduled. Please set up referral to Dr. Burt Knack cards.  I placed urgent referral but not truly urgent, however pt would like to expedite.

## 2010-12-22 ENCOUNTER — Encounter: Payer: Medicare Other | Admitting: *Deleted

## 2010-12-22 ENCOUNTER — Encounter: Payer: Self-pay | Admitting: Cardiovascular Disease

## 2010-12-22 ENCOUNTER — Ambulatory Visit (INDEPENDENT_AMBULATORY_CARE_PROVIDER_SITE_OTHER): Payer: Medicare Other | Admitting: Cardiovascular Disease

## 2010-12-22 ENCOUNTER — Ambulatory Visit: Payer: Medicare Other | Admitting: Cardiovascular Disease

## 2010-12-22 VITALS — BP 120/70 | HR 62 | Resp 12 | Ht 65.0 in | Wt 166.0 lb

## 2010-12-22 DIAGNOSIS — I70219 Atherosclerosis of native arteries of extremities with intermittent claudication, unspecified extremity: Secondary | ICD-10-CM

## 2010-12-22 DIAGNOSIS — I739 Peripheral vascular disease, unspecified: Secondary | ICD-10-CM

## 2010-12-22 NOTE — Patient Instructions (Signed)
Your physician recommends that you schedule a follow-up appointment in: 2-4 weeks with Dr. Burt Knack  Non-Cardiac CT scanning, (CAT scanning), is a noninvasive, special x-ray that produces cross-sectional images of the body using x-rays and a computer. CT scans help physicians diagnose and treat medical conditions. For some CT exams, a contrast material is used to enhance visibility in the area of the body being studied. CT scans provide greater clarity and reveal more details than regular x-ray exams. Do not eat or drink for 4 hours prior to test

## 2010-12-22 NOTE — Telephone Encounter (Signed)
She sees Dr. Burt Knack today. Test ordered by [employer]

## 2010-12-23 LAB — BASIC METABOLIC PANEL
BUN: 16 mg/dL (ref 6–23)
CO2: 27 mEq/L (ref 19–32)
Calcium: 9.6 mg/dL (ref 8.4–10.5)
Chloride: 105 mEq/L (ref 96–112)
Creatinine, Ser: 1.1 mg/dL (ref 0.4–1.2)
Glucose, Bld: 97 mg/dL (ref 70–99)

## 2010-12-23 NOTE — Telephone Encounter (Signed)
Noted  

## 2010-12-28 ENCOUNTER — Encounter: Payer: Self-pay | Admitting: Cardiovascular Disease

## 2010-12-31 ENCOUNTER — Ambulatory Visit (INDEPENDENT_AMBULATORY_CARE_PROVIDER_SITE_OTHER)
Admission: RE | Admit: 2010-12-31 | Discharge: 2010-12-31 | Disposition: A | Discharge status: Home / Self Care | Payer: Medicare Other | Source: Ambulatory Visit | Attending: Cardiovascular Disease | Admitting: Cardiovascular Disease

## 2010-12-31 DIAGNOSIS — I70219 Atherosclerosis of native arteries of extremities with intermittent claudication, unspecified extremity: Secondary | ICD-10-CM

## 2010-12-31 MED ORDER — IOHEXOL 350 MG/ML SOLN
120.0000 mL | Freq: Once | INTRAVENOUS | Status: AC | PRN
  Administered 2010-12-31: 120 mL via INTRAVENOUS

## 2011-01-05 ENCOUNTER — Encounter: Payer: Self-pay | Admitting: Cardiovascular Disease

## 2011-01-05 NOTE — Assessment & Plan Note (Signed)
The patient has new onset, lifestyle limiting claudication. She has severe right iliac stenosis which anatomically correlates with her symptoms. This is complicated by the presence of a moderate AAA. At age 77, it is difficult to predict if her aneurysm will grow enough to require treatment in her lifetime. I think it is best to carefully define her anatomy with a CT angiogram of the aortoiliac region to determine if she is a candidate for PTA/stenting. We certainly would not want to limit her treatment options in the future to open aneurysm repair, but she still may be a candidate for iliac stenting if her iliac lesion does not involve the ostium of the iliac artery. All of this was discussed in detail with the patient and she understands. I'll see her back in follow-up after the CTA is completed. Depending on the chosen treatment plan, she will need imaging surveillance of her AAA.

## 2011-01-05 NOTE — Progress Notes (Signed)
HPI:  77 year-old woman presenting for initial evaluation of lower extremity PAD. The patient was in her normal state of health until about 8 weeks ago when she developed pain in the right leg with ambulation. She describes and 'ache' in the right leg, both upper and lower leg and particularly in the calf muscles with exertion. Symptoms have progressed and now occur with walking less than 2 city blocks. She previously has been an avid walker and has had no symptoms until recently. She denies rest pain or ulceration. No symptoms involving the left leg. She has rare chest pain with no change in the pattern (occcurs less than once every 2-3 months).   Abdominal aortic duplex and ABI's showed severe right common iliac stenosis and an ABI of 0.73 on the right with no significant disease on the left and a normal ABI on that side.  There was also notation of a 3.9 cm infrarenal AAA.  Outpatient Encounter Prescriptions as of 12/22/2010  Medication Sig Dispense Refill  . aspirin 81 MG tablet Take 81 mg by mouth daily.        Marland Kitchen atorvastatin (LIPITOR) 20 MG tablet Take 20 mg by mouth daily.        . nitroGLYCERIN (NITROSTAT) 0.4 MG SL tablet Place 0.4 mg under the tongue every 5 (five) minutes as needed.        . triamterene-hydrochlorothiazide (MAXZIDE-25) 37.5-25 MG per tablet Take 1 tablet by mouth daily.        Marland Kitchen DISCONTD: ALPRAZolam (XANAX) 0.25 MG tablet Take 0.25 mg by mouth 3 (three) times daily as needed.          Review of patient's allergies indicates no known allergies.  Past Medical History  Diagnosis Date  . Anxiety   . Hyperlipidemia   . Hypertension   . Anemia   . Chronic venous insufficiency   . PAD (peripheral artery disease) 2012    severe R common iliac stenosis, ABI 0.73, mid L common iliac stenosis ABI 1.1  . AAA (abdominal aortic aneurysm) 2012    3.9cm, rec rpt 1 yr  . Hx of colonoscopy     Past Surgical History  Procedure Date  . Tonsillectomy and adenoidectomy 1960  .  Knee arthroscopy 1998    right  . Cardiovascular stress test 2008    negative  . Dobutamine stress echo 11/11    negative    History   Social History  . Marital Status: Widowed    Spouse Name: N/A    Number of Children: 4  . Years of Education: N/A   Occupational History  . retired- Therapist, sports,     Social History Main Topics  . Smoking status: Current Some Day Smoker  . Smokeless tobacco: Not on file  . Alcohol Use: Yes     regular wine with dinner  . Drug Use: Not on file  . Sexually Active: Not on file   Other Topics Concern  . Not on file   Social History Narrative   Widowed 2/11 -2nd for him , 1st for her. 4 step sons. 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 Alzheimer's disease Mother   . Pancreatic cancer Father   . Coronary artery disease Neg Hx   . Diabetes Neg Hx   . Cancer Neg Hx  breast or colon    ROS: General: no fevers/chills/night sweats Eyes: no blurry vision, diplopia, or amaurosis ENT: no sore throat or hearing loss Resp: no cough, wheezing, or hemoptysis CV: no edema or palpitations GI: no abdominal pain, nausea, vomiting, diarrhea, or constipation GU: no dysuria, frequency, or hematuria Skin: no rash Neuro: no headache, numbness, tingling, or weakness of extremities Musculoskeletal: no joint pain or swelling Heme: no bleeding, DVT, or easy bruising Endo: no polydipsia or polyuria  BP 120/70  Pulse 62  Resp 12  Ht 5\' 5"  (1.651 m)  Wt 166 lb (75.297 kg)  BMI 27.62 kg/m2  PHYSICAL EXAM: Pt is alert and oriented, WD, WN, in no distress. HEENT: normal Neck: JVP normal. Carotid upstrokes normal without bruits. No thyromegaly. Lungs: equal expansion, clear bilaterally CV: Apex is discrete and nondisplaced, RRR without murmur or gallop Abd: soft,  NT, +BS, no bruit, no hepatosplenomegaly Back: no CVA tenderness Ext: no C/C/E        Femoral pulses 1+ on right, 2+ on left        DP/PT pulses 1+ on right, 2+ on left Skin: warm and dry without rash Neuro: CNII-XII intact             Strength intact = bilaterally  EKG:   NSR 62 bpm, within normal limits  ASSESSMENT AND PLAN:

## 2011-01-12 ENCOUNTER — Encounter: Payer: Self-pay | Admitting: Cardiovascular Disease

## 2011-01-12 ENCOUNTER — Ambulatory Visit (INDEPENDENT_AMBULATORY_CARE_PROVIDER_SITE_OTHER): Payer: Medicare Other | Admitting: Cardiovascular Disease

## 2011-01-12 DIAGNOSIS — I739 Peripheral vascular disease, unspecified: Secondary | ICD-10-CM

## 2011-01-12 DIAGNOSIS — I70219 Atherosclerosis of native arteries of extremities with intermittent claudication, unspecified extremity: Secondary | ICD-10-CM

## 2011-01-12 DIAGNOSIS — I714 Abdominal aortic aneurysm, without rupture, unspecified: Secondary | ICD-10-CM

## 2011-01-12 NOTE — Patient Instructions (Signed)
You have been referred to Dr. Trula Slade at [facility]. Their office will be in touch with you with appointment time.

## 2011-01-19 ENCOUNTER — Telehealth: Payer: Self-pay | Admitting: Cardiovascular Disease

## 2011-01-19 ENCOUNTER — Encounter: Payer: Self-pay | Admitting: Cardiovascular Disease

## 2011-01-19 NOTE — Progress Notes (Signed)
HPI:  77 year-old woman presenting for follow-up evaluation. She was recently diagnosed with lower extremity PAD and an incidental AAA. She complains of right leg pain with ambulation that hasn't changed since her evaluation here a few weeks ago. She denies rest pain or left leg pain. No chest pain, dyspnea, or other complaints.  ABI on the right is 0.73 and there was a severely elevated velocity (>500 cm/s) over the right common iliac artery on duplex scan. Aortoiliac CTA was performed and this showed an infrarenal AAA with maximum diameter 3.7 x 4.2 cm. There is greater than 75% ostial right common iliac stenosis and a right common iliac aneurysm of 2 cm diameter.   Outpatient Encounter Prescriptions as of 01/12/2011  Medication Sig Dispense Refill  . aspirin 81 MG tablet Take 81 mg by mouth daily.        Marland Kitchen atorvastatin (LIPITOR) 20 MG tablet Take 20 mg by mouth daily.        . nitroGLYCERIN (NITROSTAT) 0.4 MG SL tablet Place 0.4 mg under the tongue every 5 (five) minutes as needed.        . triamterene-hydrochlorothiazide (MAXZIDE-25) 37.5-25 MG per tablet Take 1 tablet by mouth daily.        Marland Kitchen DISCONTD: ALPRAZolam (XANAX) 0.25 MG tablet Take 0.25 mg by mouth 3 (three) times daily as needed.          No Known Allergies  Past Medical History  Diagnosis Date  . Anxiety   . Hyperlipidemia   . Hypertension   . Anemia   . Chronic venous insufficiency   . PAD (peripheral artery disease) 2012    severe R common iliac stenosis, ABI 0.73, mid L common iliac stenosis ABI 1.1  . AAA (abdominal aortic aneurysm) 2012    3.9cm, rec rpt 1 yr  . Hx of colonoscopy     ROS: Negative except as per HPI  BP 122/70  Pulse 69  Ht 5' 4.5" (1.638 m)  Wt 166 lb 6.4 oz (75.479 kg)  BMI 28.12 kg/m2  PHYSICAL EXAM: Pt is alert and oriented, NAD The remainder of the patient's physical exam was deferred as she just underwent recent exam in all of our time was spent in discussion today.  ASSESSMENT AND  PLAN:

## 2011-01-19 NOTE — Telephone Encounter (Signed)
Spoke with pt and let her know she has been referred to Dr. Trula Slade at [facility] and that paperwork will be faxed to his office in next day or two . [facility] will then be in touch with her with appointment time.

## 2011-01-19 NOTE — Telephone Encounter (Signed)
Pt calling stating that she has not heard from individual in [facility]. Pt wants someone to look in her last record and see who she was referred to. Pt said she has yet to be contacted from the office to whom she was referred to. Please call office and ask that they call pt to schedule appt.

## 2011-01-20 NOTE — Assessment & Plan Note (Signed)
Lengthy discussion today with the patient regarding her PAD. I explained to her that her symptoms are likely related to right common iliac stenosis. Treatment options are complicated by the presence of her AAA and iliac aneurysm. PTA and stenting of her iliac stenosis could potentially preclude future endovascular repair of her aneurysm. At the present time, neither her AAA or iliac aneurysm require repair based on size criteria. I offered her angiography but she prefers a second opinion/consultation by vascular surgery. I am going to refer her to Dr Trula Slade for further evaluation. SInce she has aneurysmal disease of the abdominal aorta and right iliac artery, EVAR/stent grafting may be a reasonable treatment option.

## 2011-01-21 ENCOUNTER — Ambulatory Visit: Payer: Medicare Other | Admitting: Cardiovascular Disease

## 2011-01-21 NOTE — Telephone Encounter (Signed)
Paperwork will be faxed today.

## 2011-02-10 ENCOUNTER — Telehealth: Payer: Self-pay | Admitting: Cardiovascular Disease

## 2011-02-10 NOTE — Telephone Encounter (Addendum)
Pt Asked for copied of LOV,CT...copied for her she will pick up Next week and sign ROI.. 02/10/11/km  Records pick up by Pt/ROI signed 02/17/11/km

## 2011-03-01 ENCOUNTER — Encounter: Payer: Self-pay | Admitting: Surgery

## 2011-03-08 ENCOUNTER — Encounter: Payer: Self-pay | Admitting: Surgery

## 2011-03-08 ENCOUNTER — Ambulatory Visit (INDEPENDENT_AMBULATORY_CARE_PROVIDER_SITE_OTHER): Payer: Medicare Other | Admitting: Surgery

## 2011-03-08 ENCOUNTER — Encounter: Payer: Medicare Other | Admitting: Surgery

## 2011-03-08 VITALS — BP 118/67 | HR 68 | Resp 18 | Ht 64.0 in | Wt 165.0 lb

## 2011-03-08 DIAGNOSIS — I714 Abdominal aortic aneurysm, without rupture: Secondary | ICD-10-CM

## 2011-03-08 DIAGNOSIS — I739 Peripheral vascular disease, unspecified: Secondary | ICD-10-CM

## 2011-03-08 MED ORDER — CILOSTAZOL 100 MG PO TABS: 100.0000 mg | ORAL_TABLET | Freq: Two times a day (BID) | ORAL | Status: DC

## 2011-03-08 NOTE — Progress Notes (Signed)
[facility]   Patient name: Tiffany Martinez MRN: [id number] DOB: 30-Jul-1933 Sex: female   Referred by: Dr. Burt Knack  Reason for referral:  Chief Complaint  Patient presents with  . AAA    Eval per Dr. Silvio Pate and Dr. Burt Knack.   CTA on 12-31-10.  Marland Kitchen PAD    Heaviness in her legs when walking    HISTORY OF PRESENT ILLNESS: This is a very pleasant female that I am seeing at the request of Dr. Burt Knack for both peripheral arterial disease as well as aneurysmal disease. The patient states that beginning several months ago she noticed heaviness in her right leg about the time she was to take a trip to [place]. This was brought about by activity. She is only able to ambulate approximately 100 yards before she has trouble. Resting makes her symptoms go away. She denies having rest pain or ulceration. Her left leg appears to be unaffected. She has been evaluated with ultrasound which shows an ankle-brachial index of 0.73 on the right and 1.1 on the left. This was followed up with a CT angiogram this shows aneurysmal changes to the abdominal aorta approximately 4 cm. She also has a right iliac aneurysm of 2 cm. There is a 75% stenosis within the right common iliac artery. She is sent to me to discuss her treatment options.  The patient does suffer from hypertension and is on medication for this. She does have some concern regarding some right-sided neck and shoulder pain as well as some what sounds like palpitations. She does not have a history of a heart attack. She suffers from hypertension and hyperlipidemia which are medically managed. She continues to smoke one pack of cigarettes per week  Past Medical History  Diagnosis Date  . Anxiety   . Hyperlipidemia   . Hypertension   . Anemia   . Chronic venous insufficiency   . PAD (peripheral artery disease) 2012    severe R common iliac stenosis, ABI 0.73, mid L common iliac stenosis ABI 1.1  . AAA (abdominal aortic aneurysm)  2012    3.9cm, rec rpt 1 yr  . Hx of colonoscopy   . PAD (peripheral artery disease)   . Melanoma     Right Arm    Past Surgical History  Procedure Date  . Tonsillectomy and adenoidectomy 1960  . Knee arthroscopy 1998    right  . Cardiovascular stress test 2008    negative  . Dobutamine stress echo 11/11    negative  . Melanoma excision 11/09/10    wide excision right upper arm    History   Social History  . Marital Status: Widowed    Spouse Name: N/A    Number of Children: 4  . Years of Education: N/A   Occupational History  . retired- Therapist, sports,     Social History Main Topics  . Smoking status: Current Some Day Smoker  . Smokeless tobacco: Not on file   Comment: 1 pack per week at the most  . Alcohol Use: Yes     regular wine with dinner  . Drug Use: No  . Sexually Active: Not on file   Other Topics Concern  . Not on file   Social History Narrative   Widowed 2/11 -2nd for him , 1st for her. 4 step sons. 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 Alzheimer's disease Mother   . Pancreatic cancer Father   . Coronary artery disease Neg Hx   . Diabetes Neg Hx   . Cancer Neg Hx     breast or colon    Allergies as of 03/08/2011  . (No Known Allergies)    Current Outpatient Prescriptions on File Prior to Visit  Medication Sig Dispense Refill  . aspirin 81 MG tablet Take 81 mg by mouth daily.        Marland Kitchen atorvastatin (LIPITOR) 20 MG tablet Take 20 mg by mouth daily.        . nitroGLYCERIN (NITROSTAT) 0.4 MG SL tablet Place 0.4 mg under the tongue every 5 (five) minutes as needed.        . triamterene-hydrochlorothiazide (MAXZIDE-25) 37.5-25 MG per tablet Take 1 tablet by mouth daily.           REVIEW OF SYSTEMS: General: Positive for weight gain Vascular: Positive for  pain in her legs with walking Cardiac: Positive for chest and shoulder pain as well as palpitations GI negative All other review of systems are negative as documented in the encounter form PHYSICAL EXAMINATION: General: The patient appears their stated age.  Vital signs are BP 118/67  Pulse 68  Resp 18  Ht 5\' 4"  (1.626 m)  Wt 165 lb (74.844 kg)  BMI 28.32 kg/m2  SpO2 98% Pulmonary: There is a good air exchange bilaterally without wheezing or rales. Abdomen: Soft and non-tender with normal pitch bowel sounds. Abdominal aorta is not palpable Musculoskeletal: There are no major deformities.   Neurologic: No focal weakness or paresthesias are detected, Skin: There are no ulcer or rashes noted. Psychiatric: The patient has normal affect. Cardiovascular: There is a regular rate and rhythm without significant murmur appreciated. No carotid bruits palpable left pedal pulse nonpalpable right  Diagnostic Studies: Please see CT findings and ultrasound findings in the history of present illness  Outside Studies/Documentation Historical records were reviewed.  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 If she proceed with intervention this would be an aortoiliac endovascular device she will need coil embolization of the right hypogastric artery in order to exclude her right common iliac aneurysm. In the setting of her aneurysm repair and make sure that we dilated her right common iliac stenosis thereby taking care of all her problems in one setting. We did discuss all the risk of endovascular aneurysm repair including the risk of death the risk of bleeding risk of GI complications  the risk of embolization the risk of bleeding the risk of stroke. After a well-informed conversation we also discussed the role of medical management in this condition. Since she does not meet criteria for aneurysm repair but does have significant iliac stenosis and symptomatic claudication I feel that it would be reasonable to give her a trial of cilostazol to see if this can keep her symptoms under control. In the meantime we will continue to surveillance her aneurysms. Have given her a prescription for cilostazol 100 mg to be taken twice daily she was warned of the side effects. She is to followup with me in 6 weeks to see how she  is done. In total I spent approximately 90 minutes reviewing the patient's records studying her imaging studies and discussing the plan with the patient     V. Leia Alf, M.D. [facility] [place] Office: [phone]

## 2011-03-10 ENCOUNTER — Other Ambulatory Visit: Payer: Self-pay | Admitting: Internal Medicine

## 2011-03-12 ENCOUNTER — Telehealth: Payer: Self-pay | Admitting: *Deleted

## 2011-03-12 NOTE — Telephone Encounter (Signed)
Advises pt, she is agreeable with these instructions.

## 2011-03-12 NOTE — Telephone Encounter (Signed)
Doesn't sound cardiac I wouldn't advise NTG for that---sounds more like a nerve thing We will discuss on Monday

## 2011-03-12 NOTE — Telephone Encounter (Signed)
Pt states that she has had tingling in her fingers on both hands and a pain in her neck for 2 weeks.  This comes and goes and is not getting worse.   It woke her up last night, but she hasnt felt it today.  She has appt to see you on Monday.  She is asking if you think she should take ntg to see if that helps.  Please advise on what she should do.

## 2011-03-15 ENCOUNTER — Ambulatory Visit (INDEPENDENT_AMBULATORY_CARE_PROVIDER_SITE_OTHER): Payer: Medicare Other | Admitting: Internal Medicine

## 2011-03-15 ENCOUNTER — Encounter: Payer: Self-pay | Admitting: Internal Medicine

## 2011-03-15 VITALS — BP 131/65 | HR 67 | Temp 98.3°F | Ht 64.0 in | Wt 167.0 lb

## 2011-03-15 DIAGNOSIS — F411 Generalized anxiety disorder: Secondary | ICD-10-CM

## 2011-03-15 DIAGNOSIS — R209 Unspecified disturbances of skin sensation: Secondary | ICD-10-CM

## 2011-03-15 DIAGNOSIS — R200 Anesthesia of skin: Secondary | ICD-10-CM

## 2011-03-15 DIAGNOSIS — I739 Peripheral vascular disease, unspecified: Secondary | ICD-10-CM

## 2011-03-15 DIAGNOSIS — I10 Essential (primary) hypertension: Secondary | ICD-10-CM

## 2011-03-15 DIAGNOSIS — R202 Paresthesia of skin: Secondary | ICD-10-CM | POA: Insufficient documentation

## 2011-03-15 NOTE — Assessment & Plan Note (Signed)
Doesn't appear to be a neuropathy May be from the bursitis in shoulder, ?brachial plexus involvement? Will try ice on shoulder

## 2011-03-15 NOTE — Progress Notes (Signed)
Subjective:    Patient ID: Tiffany Martinez, female    DOB: 1933-06-28, 77 y.o.   MRN: [id number]  HPI Had sudden onset of claudication Seen here and then Dr Burt Knack after abnormal ABI Then vascular eval Had to cancel trip to [place]  Did have appointment with Dr Maylene Roes aneurysm Decided on medical Rx--so started on pletal----she didn't start this though, concerned about side effects  Started with tingling in right fingers None in feet Associated with pain in right shoulder----points over SCM and clavicle No known injury---?couldn't remember if she carried something heavy  Anxiety acting up Had melanoma, then this vascular issues This has really affected her mood  Discussed her cigarettes Really must give them up for good  No chest pain No SOB  Current Outpatient Prescriptions on File Prior to Visit  Medication Sig Dispense Refill  . aspirin 81 MG tablet Take 81 mg by mouth daily.        Marland Kitchen atorvastatin (LIPITOR) 20 MG tablet Take 20 mg by mouth daily.        . cilostazol (PLETAL) 100 MG tablet Take 1 tablet (100 mg total) by mouth 2 (two) times daily before a meal.  60 tablet  5  . nitroGLYCERIN (NITROSTAT) 0.4 MG SL tablet Place 0.4 mg under the tongue every 5 (five) minutes as needed.        . triamterene-hydrochlorothiazide (MAXZIDE-25) 37.5-25 MG per tablet take 1 tablet by mouth once daily for high blood pressure  30 tablet  12    No Known Allergies  Past Medical History  Diagnosis Date  . Anxiety   . Hyperlipidemia   . Hypertension   . Anemia   . Chronic venous insufficiency   . PAD (peripheral artery disease) 2012    severe R common iliac stenosis, ABI 0.73, mid L common iliac stenosis ABI 1.1  . AAA (abdominal aortic aneurysm) 2012    3.9cm, rec rpt 1 yr  . Hx of colonoscopy   . PAD (peripheral artery disease)   . Melanoma     Right Arm    Past Surgical History  Procedure Date  . Tonsillectomy and adenoidectomy 1960  . Knee arthroscopy 1998      right  . Cardiovascular stress test 2008    negative  . Dobutamine stress echo 11/11    negative  . Melanoma excision 11/09/10    wide excision right upper arm    Family History  Problem Relation Age of Onset  . Alzheimer's disease Mother   . Pancreatic cancer Father   . Coronary artery disease Neg Hx   . Diabetes Neg Hx   . Cancer Neg Hx     breast or colon    History   Social History  . Marital Status: Widowed    Spouse Name: N/A    Number of Children: 4  . Years of Education: N/A   Occupational History  . retired- Therapist, sports,     Social History Main Topics  . Smoking status: Current Some Day Smoker  . Smokeless tobacco: Never Used   Comment: down to one a day or so  . Alcohol Use: Yes     regular wine with dinner  . Drug Use: No  . Sexually Active: Not on file   Other Topics Concern  . Not on file   Social History Narrative   Widowed 2/11 -2nd for him , 1st for her. 4 step sons. 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 No distress.  Neck: Normal range of motion. No thyromegaly present.       Slight tightness along right SCM muscle  Cardiovascular: Normal rate, regular rhythm and normal heart sounds.  Exam reveals no gallop.   No murmur heard. Pulmonary/Chest: Effort normal and breath sounds normal. No respiratory distress. She has no wheezes. She has no rales.  Musculoskeletal:       Normal ROM at right shoulder Mild bursa tenderness  Lymphadenopathy:    She has no cervical adenopathy.  Neurological:       Strength normal in arms  Psychiatric: Her behavior is normal. Judgment and thought content normal.       Slightly anxious          Assessment &  Plan:

## 2011-03-15 NOTE — Assessment & Plan Note (Signed)
Discussed cigarette cessation and starting the pletal

## 2011-03-15 NOTE — Assessment & Plan Note (Signed)
Worse with all the medical issues Discussed No meds

## 2011-03-15 NOTE — Assessment & Plan Note (Signed)
BP Readings from Last 3 Encounters:  03/15/11 131/65  03/08/11 118/67  01/12/11 122/70   Good control No changes needed

## 2011-04-01 ENCOUNTER — Ambulatory Visit: Payer: Medicare Other | Admitting: Internal Medicine

## 2011-04-19 ENCOUNTER — Ambulatory Visit: Payer: Medicare Other | Admitting: Surgery

## 2011-04-30 ENCOUNTER — Encounter: Payer: Self-pay | Admitting: Surgery

## 2011-05-03 ENCOUNTER — Ambulatory Visit (INDEPENDENT_AMBULATORY_CARE_PROVIDER_SITE_OTHER): Payer: Medicare Other | Admitting: Surgery

## 2011-05-03 ENCOUNTER — Encounter: Payer: Self-pay | Admitting: Surgery

## 2011-05-03 VITALS — BP 136/70 | HR 75 | Resp 16 | Ht 64.0 in | Wt 169.0 lb

## 2011-05-03 DIAGNOSIS — I739 Peripheral vascular disease, unspecified: Secondary | ICD-10-CM

## 2011-05-03 DIAGNOSIS — I70219 Atherosclerosis of native arteries of extremities with intermittent claudication, unspecified extremity: Secondary | ICD-10-CM

## 2011-05-03 NOTE — Progress Notes (Signed)
[facility]   Patient name: Tiffany Martinez MRN: [id number] DOB: [birth year] Sex: female     Chief Complaint  Patient presents with  . PVD    6 week AAA f/up,right shoulder pain comes/goes 2-3 weeks    HISTORY OF PRESENT ILLNESS: The patient is back today for followup of her right leg claudication and abdominal aneurysm. She was initially referred to me by Dr. Burt Knack. When I first saw her she was ambulating approximately 100 yards before she had to stop due to heaviness in her legs. We started her on cilostazol. She is still having problems with walking however these do not appear to be lifestyle limiting at this time. She has a known 4 cm abdominal aortic aneurysm and right iliac aneurysm measuring 2 cm. There is a 75% right common iliac stenosis. The patient does not have ulcers or rest pain. She denies abdominal pain or back pain.  Past Medical History  Diagnosis Date  . Anxiety   . Hyperlipidemia   . Hypertension   . Anemia   . Chronic venous insufficiency   . PAD (peripheral artery disease) 2012    severe R common iliac stenosis, ABI 0.73, mid L common iliac stenosis ABI 1.1  . AAA (abdominal aortic aneurysm) 2012    3.9cm, rec rpt 1 yr  . Hx of colonoscopy   . PAD (peripheral artery disease)   . Melanoma     Right Arm    Past Surgical History  Procedure Date  . Tonsillectomy and adenoidectomy 1960  . Knee arthroscopy 1998    right  . Cardiovascular stress test 2008    negative  . Dobutamine stress echo 11/11    negative  . Melanoma excision 11/09/10    wide excision right upper arm    History   Social History  . Marital Status: Widowed    Spouse Name: N/A    Number of Children: 4  . Years of Education: N/A   Occupational History  . retired- Therapist, sports,     Social History Main Topics  . Smoking status: Current Some Day Smoker  . Smokeless tobacco: Never Used   Comment: down to one a day or so  . Alcohol Use: Yes     regular wine  with dinner  . Drug Use: No  . Sexually Active: Not on file   Other Topics Concern  . Not on file   Social History Narrative   Widowed 2/11 -2nd for him , 1st for her. 4 step sons. 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 Alzheimer's disease Mother   . Pancreatic cancer Father   . Coronary artery disease Neg Hx   . Diabetes Neg Hx   . Cancer Neg Hx     breast or colon    Allergies as of 05/03/2011  . (No Known Allergies)    Current Outpatient Prescriptions on File Prior to Visit  Medication Sig Dispense Refill  . aspirin 81 MG tablet Take 81 mg by mouth daily.        Marland Kitchen atorvastatin (LIPITOR) 20 MG tablet Take 20 mg by mouth daily.        . nitroGLYCERIN (NITROSTAT) 0.4 MG SL tablet Place 0.4 mg under the tongue every 5 (five) minutes as needed.        Marland Kitchen  triamterene-hydrochlorothiazide (MAXZIDE-25) 37.5-25 MG per tablet take 1 tablet by mouth once daily for high blood pressure  30 tablet  12  . cilostazol (PLETAL) 100 MG tablet Take 1 tablet (100 mg total) by mouth 2 (two) times daily before a meal.  60 tablet  5     REVIEW OF SYSTEMS: No changes  PHYSICAL EXAMINATION:   Vital signs are BP 136/70  Pulse 75  Resp 16  Ht 5\' 4"  (1.626 m)  Wt 169 lb (76.658 kg)  BMI 29.01 kg/m2  SpO2 96% General: The patient appears their stated age. HEENT:  No gross abnormalities Pulmonary:  Non labored breathing Abdomen: Soft and non-tender Musculoskeletal: There are no major deformities. Neurologic: No focal weakness or paresthesias are detected, Skin: There are no ulcer or rashes noted. Psychiatric: The patient has normal affect. Cardiovascular: There is a regular rate and rhythm without significant murmur appreciated. No carotid bruits2   Diagnostic Studies None  today  Assessment: Abdominal aortic aneurysm and right iliac stenosis Plan: The patient does not have lifestyle limiting symptoms at this time. We had a lengthy discussion today regarding treatment options and have concluded that we will continue to monitor her symptoms and her aneurysm. I have scheduled her to come back to see me in 6 months with a repeat ultrasound  V. Leia Alf, M.D. [facility] [place] Office: [phone] Pager:  [phone]

## 2011-06-04 ENCOUNTER — Ambulatory Visit: Payer: Medicare Other | Admitting: Internal Medicine

## 2011-06-14 ENCOUNTER — Ambulatory Visit (INDEPENDENT_AMBULATORY_CARE_PROVIDER_SITE_OTHER): Payer: Medicare Other | Admitting: Internal Medicine

## 2011-06-14 ENCOUNTER — Encounter: Payer: Self-pay | Admitting: Internal Medicine

## 2011-06-14 VITALS — BP 140/70 | HR 64 | Temp 97.5°F | Ht 64.0 in | Wt 167.0 lb

## 2011-06-14 DIAGNOSIS — E785 Hyperlipidemia, unspecified: Secondary | ICD-10-CM | POA: Diagnosis not present

## 2011-06-14 DIAGNOSIS — F4321 Adjustment disorder with depressed mood: Secondary | ICD-10-CM

## 2011-06-14 DIAGNOSIS — I10 Essential (primary) hypertension: Secondary | ICD-10-CM | POA: Diagnosis not present

## 2011-06-14 DIAGNOSIS — F17200 Nicotine dependence, unspecified, uncomplicated: Secondary | ICD-10-CM

## 2011-06-14 DIAGNOSIS — I739 Peripheral vascular disease, unspecified: Secondary | ICD-10-CM

## 2011-06-14 LAB — LIPID PANEL
HDL: 45.6 mg/dL (ref >39.00)
Total CHOL/HDL Ratio: 4

## 2011-06-14 LAB — CBC WITH DIFFERENTIAL/PLATELET
Basophils Relative: 0.5 % (ref 0.0–3.0)
HCT: 32.7 % — ABNORMAL LOW (ref 36.0–46.0)
Hemoglobin: 11.2 g/dL — ABNORMAL LOW (ref 12.0–15.0)
Lymphocytes Relative: 67.3 % — ABNORMAL HIGH (ref 12.0–46.0)
MCHC: 34.1 g/dL (ref 30.0–36.0)
Monocytes Relative: 3.9 % (ref 3.0–12.0)
Neutro Abs: 4.4 10*3/uL (ref 1.4–7.7)
RBC: 3.2 Mil/uL — ABNORMAL LOW (ref 3.87–5.11)

## 2011-06-14 LAB — HEPATIC FUNCTION PANEL
ALT: 17 U/L (ref 0–35)
Alkaline Phosphatase: 80 U/L (ref 39–117)
Bilirubin, Direct: 0 mg/dL (ref 0.0–0.3)
Total Protein: 7.1 g/dL (ref 6.0–8.3)

## 2011-06-14 LAB — BASIC METABOLIC PANEL
CO2: 27 mEq/L (ref 19–32)
Calcium: 9.6 mg/dL (ref 8.4–10.5)
GFR: 48.37 mL/min — ABNORMAL LOW (ref >60.00)
Potassium: 3.8 mEq/L (ref 3.5–5.1)
Sodium: 141 mEq/L (ref 135–145)

## 2011-06-14 NOTE — Assessment & Plan Note (Signed)
Following with Dr Trula Slade Wants to avoid surgery Discussed stopping cigarettes entirely and increasing exercise effort

## 2011-06-14 NOTE — Assessment & Plan Note (Signed)
Gave info on 1-800 QUIT NOW counselled 3 minutes

## 2011-06-14 NOTE — Assessment & Plan Note (Signed)
BP Readings from Last 3 Encounters:  06/14/11 140/70  05/03/11 136/70  03/15/11 131/65   Good control Will check labs

## 2011-06-14 NOTE — Progress Notes (Signed)
Subjective:    Patient ID: Tiffany Martinez, female    DOB: 10-01-1933, 78 y.o.   MRN: [id number]  HPI Still very happy at [place] view Establishing social interactions Still grieving husband's death--but also being alone and not having him to take care of Has been sitting too much Not exercising like she should Off the pletal due to diarrhea--recurred after rechallenge  No increase in leg claudication Not really pressing the issue---notes the heaviness about the same Still smoking some Gave info on 1-800 QUIT  NOW  No chest pain No SOB No dizziness No ankle swelling  Still on the statin No apparent myalgia  Current Outpatient Prescriptions on File Prior to Visit  Medication Sig Dispense Refill  . aspirin 81 MG tablet Take 81 mg by mouth daily.        Marland Kitchen atorvastatin (LIPITOR) 20 MG tablet Take 20 mg by mouth daily.        . nitroGLYCERIN (NITROSTAT) 0.4 MG SL tablet Place 0.4 mg under the tongue every 5 (five) minutes as needed.        . triamterene-hydrochlorothiazide (MAXZIDE-25) 37.5-25 MG per tablet take 1 tablet by mouth once daily for high blood pressure  30 tablet  12    No Known Allergies  Past Medical History  Diagnosis Date  . Anxiety   . Hyperlipidemia   . Hypertension   . Anemia   . Chronic venous insufficiency   . PAD (peripheral artery disease) 2012    severe R common iliac stenosis, ABI 0.73, mid L common iliac stenosis ABI 1.1  . AAA (abdominal aortic aneurysm) 2012    3.9cm, rec rpt 1 yr  . Hx of colonoscopy   . PAD (peripheral artery disease)   . Melanoma     Right Arm    Past Surgical History  Procedure Date  . Tonsillectomy and adenoidectomy 1960  . Knee arthroscopy 1998    right  . Cardiovascular stress test 2008    negative  . Dobutamine stress echo 11/11    negative  . Melanoma excision 11/09/10    wide excision right upper arm    Family History  Problem Relation Age of Onset  . Alzheimer's disease Mother   .  Pancreatic cancer Father   . Coronary artery disease Neg Hx   . Diabetes Neg Hx   . Cancer Neg Hx     breast or colon    History   Social History  . Marital Status: Widowed    Spouse Name: N/A    Number of Children: 4  . Years of Education: N/A   Occupational History  . retired- Therapist, sports,     Social History Main Topics  . Smoking status: Current Some Day Smoker  . Smokeless tobacco: Never Used   Comment: down to one a day or so  . Alcohol Use: Yes     regular wine with dinner  . Drug Use: No  . Sexually Active: Not on file   Other Topics Concern  . Not on file   Social History Narrative   Widowed 2/11 -2nd for him , 1st for her. 4 step sons. 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 No distress.  Neck: Normal range of motion. Neck supple. No thyromegaly present.  Cardiovascular: Normal rate, regular rhythm and normal heart sounds.  Exam reveals no gallop.   No murmur heard. Pulmonary/Chest: Effort normal and breath sounds normal. No respiratory distress. She has no wheezes. She has no rales.  Musculoskeletal: She exhibits no edema and no tenderness.  Lymphadenopathy:    She has no cervical adenopathy.  Psychiatric: She has a normal mood and affect. Her behavior is normal. Judgment and thought content normal.          Assessment & Plan:

## 2011-06-14 NOTE — Assessment & Plan Note (Signed)
Ongoing mood issues but not MDD Urged her to stay active since this helps her mood

## 2011-06-24 ENCOUNTER — Telehealth: Payer: Self-pay | Admitting: Internal Medicine

## 2011-06-24 NOTE — Telephone Encounter (Signed)
Spoke with patient, she wanted to get labs done sooner just in case she had a terrible disease? I advised pt that if Dr.Letvak thought she had a terrible disease he would react sooner and that he has to be sure before he orders a lot of tests or medications. Pt stated she will wait.

## 2011-06-30 ENCOUNTER — Other Ambulatory Visit: Payer: Self-pay | Admitting: Internal Medicine

## 2011-06-30 DIAGNOSIS — R739 Hyperglycemia, unspecified: Secondary | ICD-10-CM

## 2011-06-30 DIAGNOSIS — D649 Anemia, unspecified: Secondary | ICD-10-CM

## 2011-06-30 DIAGNOSIS — E785 Hyperlipidemia, unspecified: Secondary | ICD-10-CM

## 2011-07-08 ENCOUNTER — Other Ambulatory Visit (INDEPENDENT_AMBULATORY_CARE_PROVIDER_SITE_OTHER): Payer: Medicare Other

## 2011-07-08 ENCOUNTER — Ambulatory Visit: Payer: Medicare Other

## 2011-07-08 DIAGNOSIS — R739 Hyperglycemia, unspecified: Secondary | ICD-10-CM

## 2011-07-08 DIAGNOSIS — D649 Anemia, unspecified: Secondary | ICD-10-CM

## 2011-07-08 DIAGNOSIS — R7309 Other abnormal glucose: Secondary | ICD-10-CM

## 2011-07-08 DIAGNOSIS — Z0000 Encounter for general adult medical examination without abnormal findings: Secondary | ICD-10-CM

## 2011-07-09 ENCOUNTER — Other Ambulatory Visit: Payer: Medicare Other

## 2011-07-09 LAB — CBC WITH DIFFERENTIAL/PLATELET
Basophils Absolute: 0 10*3/uL (ref 0.0–0.1)
Eosinophils Absolute: 0.2 10*3/uL (ref 0.0–0.7)
Eosinophils Relative: 1 % (ref 0–5)
Lymphs Abs: 8.7 10*3/uL — ABNORMAL HIGH (ref 0.7–4.0)
MCH: 34.7 pg — ABNORMAL HIGH (ref 26.0–34.0)
MCHC: 33.1 g/dL (ref 30.0–36.0)
MCV: 104.7 fL — ABNORMAL HIGH (ref 78.0–100.0)
Platelets: 114 10*3/uL — ABNORMAL LOW (ref 150–400)
RDW: 14.8 % (ref 11.5–15.5)

## 2011-07-15 ENCOUNTER — Ambulatory Visit: Payer: Medicare Other | Admitting: Internal Medicine

## 2011-07-16 DIAGNOSIS — L570 Actinic keratosis: Secondary | ICD-10-CM | POA: Diagnosis not present

## 2011-07-16 DIAGNOSIS — L820 Inflamed seborrheic keratosis: Secondary | ICD-10-CM | POA: Diagnosis not present

## 2011-07-16 DIAGNOSIS — L821 Other seborrheic keratosis: Secondary | ICD-10-CM | POA: Diagnosis not present

## 2011-07-16 DIAGNOSIS — Z85820 Personal history of malignant melanoma of skin: Secondary | ICD-10-CM | POA: Diagnosis not present

## 2011-09-14 ENCOUNTER — Telehealth: Payer: Self-pay | Admitting: Surgery

## 2011-09-14 NOTE — Telephone Encounter (Signed)
Tiffany Martinez DOB: 1934-01-25 wants to schedule an appointment. Please call her back.  Thanks [employer] patient, there was an appt schedule for 6/17, left message with appt details and told patient to call back to confirm.

## 2011-10-05 ENCOUNTER — Ambulatory Visit: Payer: Self-pay | Admitting: Internal Medicine

## 2011-10-05 DIAGNOSIS — Z1231 Encounter for screening mammogram for malignant neoplasm of breast: Secondary | ICD-10-CM | POA: Diagnosis not present

## 2011-10-05 DIAGNOSIS — N6489 Other specified disorders of breast: Secondary | ICD-10-CM | POA: Diagnosis not present

## 2011-10-06 ENCOUNTER — Encounter: Payer: Self-pay | Admitting: Internal Medicine

## 2011-10-07 ENCOUNTER — Encounter: Payer: Self-pay | Admitting: *Deleted

## 2011-11-01 ENCOUNTER — Ambulatory Visit: Payer: Medicare Other | Admitting: Surgery

## 2011-11-01 ENCOUNTER — Other Ambulatory Visit: Payer: Medicare Other

## 2011-11-05 ENCOUNTER — Encounter: Payer: Self-pay | Admitting: Surgery

## 2011-11-08 ENCOUNTER — Encounter: Payer: Self-pay | Admitting: Surgery

## 2011-11-08 ENCOUNTER — Ambulatory Visit (INDEPENDENT_AMBULATORY_CARE_PROVIDER_SITE_OTHER): Payer: Medicare Other | Admitting: Surgery

## 2011-11-08 ENCOUNTER — Encounter (INDEPENDENT_AMBULATORY_CARE_PROVIDER_SITE_OTHER): Payer: Medicare Other | Admitting: *Deleted

## 2011-11-08 VITALS — BP 131/57 | HR 65 | Temp 98.0°F | Ht 65.0 in | Wt 169.0 lb

## 2011-11-08 DIAGNOSIS — I714 Abdominal aortic aneurysm, without rupture: Secondary | ICD-10-CM | POA: Diagnosis not present

## 2011-11-08 DIAGNOSIS — Z01810 Encounter for preprocedural cardiovascular examination: Secondary | ICD-10-CM | POA: Diagnosis not present

## 2011-11-08 DIAGNOSIS — I70219 Atherosclerosis of native arteries of extremities with intermittent claudication, unspecified extremity: Secondary | ICD-10-CM

## 2011-11-08 DIAGNOSIS — I739 Peripheral vascular disease, unspecified: Secondary | ICD-10-CM | POA: Diagnosis not present

## 2011-11-08 DIAGNOSIS — I6529 Occlusion and stenosis of unspecified carotid artery: Secondary | ICD-10-CM

## 2011-11-08 NOTE — Progress Notes (Signed)
[facility]ascular and Vein Specialist of [facility]   Patient name: Tiffany Martinez MRN: [id number] DOB: 1933-12-22 Sex: female     Chief Complaint  Patient presents with  . PVD  . AAA    HISTORY OF PRESENT ILLNESS: The patient is back today for followup of her right leg claudication and aortic aneurysmal disease. She has a known 4 cm abdominal aortic aneurysm and right common iliac aneurysm. There is also a high-grade right common iliac stenosis. The patient's symptoms are relatively stable however they appear to be bothering her more now than they were last year. It is limiting her ability to travel and the active. She does not have rest pain or ulceration.  Past Medical History  Diagnosis Date  . Anxiety   . Hyperlipidemia   . Hypertension   . Anemia   . Chronic venous insufficiency   . PAD (peripheral artery disease) 2012    severe R common iliac stenosis, ABI 0.73, mid L common iliac stenosis ABI 1.1  . AAA (abdominal aortic aneurysm) 2012    3.9cm, rec rpt 1 yr  . Hx of colonoscopy   . PAD (peripheral artery disease)   . Melanoma     Right Arm    Past Surgical History  Procedure Date  . Tonsillectomy and adenoidectomy 1960  . Knee arthroscopy 1998    right  . Cardiovascular stress test 2008    negative  . Dobutamine stress echo 11/11    negative  . Melanoma excision 11/09/10    wide excision right upper arm    History   Social History  . Marital Status: Widowed    Spouse Name: N/A    Number of Children: 4  . Years of Education: N/A   Occupational History  . retired- Therapist, sports,     Social History Main Topics  . Smoking status: Current Some Day Smoker  . Smokeless tobacco: Never Used   Comment: down to one a day or so  . Alcohol Use: Yes     regular wine with dinner  . Drug Use: No  . Sexually Active: Not on file   Other Topics Concern  . Not on file   Social History Narrative   Widowed 2/11 -2nd for him , 1st for her. 4 step sons. 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 Alzheimer's disease Mother   . Pancreatic cancer Father   . Coronary artery disease Neg Hx   . Diabetes Neg Hx   . Cancer Neg Hx     breast or colon    Allergies as of 11/08/2011  . (No Known Allergies)    Current Outpatient Prescriptions on File Prior to Visit  Medication Sig Dispense Refill  . aspirin 81 MG tablet Take 81 mg by mouth daily.        Marland Kitchen atorvastatin (LIPITOR) 20 MG tablet Take 20 mg by mouth daily.        . nitroGLYCERIN (NITROSTAT) 0.4 MG SL tablet Place 0.4 mg under the tongue every 5 (five) minutes as needed.        . triamterene-hydrochlorothiazide (MAXZIDE-25) 37.5-25 MG per tablet take 1 tablet by mouth once daily for high blood pressure  30 tablet  12     REVIEW OF SYSTEMS: Positive for varicose veins and weakness in her legs. All other  review of systems are negative as documented by the patient and the encounter form.   PHYSICAL EXAMINATION:   Vital signs are BP 131/57  Pulse 65  Temp 98 F (36.7 C) (Oral)  Ht 5\' 5"  (1.651 m)  Wt 169 lb (76.658 kg)  BMI 28.12 kg/m2 General: The patient appears their stated age. HEENT:  No gross abnormalities Pulmonary:  Non labored breathing Abdomen: Soft and non-tender Musculoskeletal: There are no major deformities. Neurologic: No focal weakness or paresthesias are detected, Skin: There are no ulcer or rashes noted. Psychiatric: The patient has normal affect. Cardiovascular: There is a regular rate and rhythm without significant murmur appreciated. No carotid bruits. Pedal pulses are not palpable   Diagnostic Studies I have ordered and reviewed her Doppler studies today. The ABI is 0.91 on the right with biphasic waveforms and 1.0 on the left with triphasic waveforms. Ultrasound of the abdominal aorta  shows a maximum diameter of 3.8. This is slightly less than what was obtained 6 months ago (4.2 cm). Due to overlying bowel gas the iliac vessels could not be evaluated today.  Assessment: Abdominal aortic aneurysm and right leg claudication with known right common iliac stenosis. Plan: On repeat review of the patient's imaging studies I do not think that she is a candidate for percutaneous revascularization of her right leg given the location of the stenosis at the aortic bifurcation. I think the best way to correct her claudication problem is to simultaneously fix her aortic aneurysm. This also has potential issues because of the aneurysm within the distal right iliac artery. This could potentially require embolization of the right hypogastric artery in order to fully exclude her aneurysm. This is been 6 months and to have a Mr. I'm going to repeat her CT scan. She was seen back once this has been done.  Eldridge Abrahams, M.D. [facility] [place] Office: [phone] Pager:  [phone]

## 2011-11-22 NOTE — Procedures (Unsigned)
DUPLEX ULTRASOUND OF ABDOMINAL AORTA  INDICATION:  Abdominal aortic aneurysm  HISTORY: Diabetes:  No Cardiac:  No Hypertension:  Yes Smoking:  Yes Connective Tissue Disorder: Family History:  No Previous Surgery:  No  DUPLEX EXAM:         AP (cm)                   TRANSVERSE (cm) Proximal             2.6 cm                    2.4 cm Mid                  2.5 cm                    2.4 cm Distal               3.6 cm                    3.8 cm Right Iliac          Not visualized            Not visualized Left Iliac           Not visualized            Not visualized  PREVIOUS:  Date:  12/31/2010  (CT)  AP:  3.7  TRANSVERSE:  4.2  IMPRESSION: 1. Aneurysmal dilatation of the distal abdominal aorta with no     significant change when compared to the previous exam. 2. Unable to adequately visualize the bilateral common iliac arteries     due to overlying bowel gas patterns.  A 2.1 cm distal right common     iliac artery aneurysm was noted on the previous CT along with a 75%     diameter narrowing of the proximal right common iliac artery.  ___________________________________________ V. Leia Alf, MD  CH/MEDQ  D:  11/12/2011  T:  11/12/2011  Job:  [id number]

## 2011-11-26 ENCOUNTER — Encounter: Payer: Self-pay | Admitting: Surgery

## 2011-11-29 ENCOUNTER — Ambulatory Visit: Payer: Medicare Other | Admitting: Internal Medicine

## 2011-11-29 ENCOUNTER — Encounter: Payer: Self-pay | Admitting: Surgery

## 2011-11-29 ENCOUNTER — Ambulatory Visit (INDEPENDENT_AMBULATORY_CARE_PROVIDER_SITE_OTHER): Payer: Medicare Other | Admitting: Surgery

## 2011-11-29 ENCOUNTER — Ambulatory Visit (INDEPENDENT_AMBULATORY_CARE_PROVIDER_SITE_OTHER): Payer: Medicare Other | Admitting: Vascular Surgery

## 2011-11-29 VITALS — BP 142/62 | HR 65 | Temp 97.9°F | Ht 64.5 in | Wt 169.0 lb

## 2011-11-29 DIAGNOSIS — I714 Abdominal aortic aneurysm, without rupture: Secondary | ICD-10-CM

## 2011-11-29 DIAGNOSIS — Z01810 Encounter for preprocedural cardiovascular examination: Secondary | ICD-10-CM | POA: Diagnosis not present

## 2011-11-29 DIAGNOSIS — I70219 Atherosclerosis of native arteries of extremities with intermittent claudication, unspecified extremity: Secondary | ICD-10-CM | POA: Diagnosis not present

## 2011-11-29 DIAGNOSIS — I6529 Occlusion and stenosis of unspecified carotid artery: Secondary | ICD-10-CM

## 2011-11-29 DIAGNOSIS — I712 Thoracic aortic aneurysm, without rupture: Secondary | ICD-10-CM | POA: Diagnosis not present

## 2011-11-29 NOTE — Progress Notes (Signed)
Carotid duplex performed @ [facility] 11/29/2011

## 2011-11-29 NOTE — Progress Notes (Signed)
[facility]ascular and Vein Specialist of [facility]   Patient name: Tiffany Martinez MRN: [id number] DOB: 1934/03/19 Sex: female     Chief Complaint  Patient presents with  . PVD    2 week f/u     HISTORY OF PRESENT ILLNESS: The patient comes in today to discuss options for treating her right leg claudication. She has a stenosis at the origin of the right common iliac artery and aneurysmal changes to her abdominal aorta and distal right iliac artery. She is supposed to have a CT angiogram performed today however this did not happen. She continues to be with right leg claudication that is somewhat lifestyle limiting to her. She denies having abdominal pain or back pain.  Past Medical History  Diagnosis Date  . Anxiety   . Hyperlipidemia   . Hypertension   . Anemia   . Chronic venous insufficiency   . PAD (peripheral artery disease) 2012    severe R common iliac stenosis, ABI 0.73, mid L common iliac stenosis ABI 1.1  . AAA (abdominal aortic aneurysm) 2012    3.9cm, rec rpt 1 yr  . Hx of colonoscopy   . PAD (peripheral artery disease)   . Melanoma     Right Arm    Past Surgical History  Procedure Date  . Tonsillectomy and adenoidectomy 1960  . Knee arthroscopy 1998    right  . Cardiovascular stress test 2008    negative  . Dobutamine stress echo 11/11    negative  . Melanoma excision 11/09/10    wide excision right upper arm    History   Social History  . Marital Status: Widowed    Spouse Name: N/A    Number of Children: 4  . Years of Education: N/A   Occupational History  . retired- Therapist, sports,     Social History Main Topics  . Smoking status: Current Some Day Smoker    Types: Cigarettes  . Smokeless tobacco: Never Used   Comment: pt states that she has 2-3 cigarettes per day   . Alcohol Use: Yes     regular wine with dinner  . Drug Use: No  . Sexually Active: Not on file   Other Topics Concern  . Not on file   Social History Narrative   Widowed 2/11 -2nd for him ,  1st for her. 4 step sons. 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 Alzheimer's disease Mother   . Pancreatic cancer Father   . Coronary artery disease Neg Hx   . Diabetes Neg Hx   . Cancer Neg Hx     breast or colon    Allergies as of 11/29/2011  . (No Known Allergies)    Current Outpatient Prescriptions on File Prior to Visit  Medication Sig Dispense Refill  . aspirin 81 MG tablet Take 81 mg by mouth daily.        Marland Kitchen atorvastatin (LIPITOR) 20 MG tablet Take 20 mg by mouth daily.        . nitroGLYCERIN (NITROSTAT) 0.4 MG SL tablet Place 0.4 mg under the tongue every 5 (five) minutes as needed.        . triamterene-hydrochlorothiazide (MAXZIDE-25) 37.5-25 MG per tablet take 1 tablet by mouth once daily for high blood pressure  30 tablet  12     REVIEW OF  SYSTEMS: No change from prior visit  PHYSICAL EXAMINATION:   Vital signs are BP 142/62  Pulse 65  Temp 97.9 F (36.6 C) (Oral)  Ht 5' 4.5" (1.638 m)  Wt 169 lb (76.658 kg)  BMI 28.56 kg/m2  SpO2 98% General: The patient appears their stated age, no acute distress Physical examination not performed today    Diagnostic Studies Carotid ultrasound: Bilateral 1-39% carotid stenosis   Assessment: Right leg claudication and small aneurysm Plan: At this point I need a CT angiogram to evaluate our options. Originally, I had considered fixing her aneurysm simultaneously with a dressing her right leg claudication do to the location of her stenosis in the proximal right common iliac artery. She does have ectasia of the distal right common iliac artery which may necessitate embolization of the right hypogastric artery. This certainly makes her repair a lot more complicated. I am now considering percutaneous intervention on the iliac artery  to alleviate her symptoms. I I just do not want to take away endovascular options to fix her aneurysm. I told the patient will call her once we have her CT scan results.  Eldridge Abrahams, M.D. [facility] of [place] Office: [phone] Pager:  [phone]

## 2011-11-30 NOTE — Addendum Note (Signed)
Addended by: Mena Goes on: 11/30/2011 09:37 AM   Modules accepted: Orders

## 2011-12-01 ENCOUNTER — Other Ambulatory Visit: Payer: Self-pay | Admitting: Surgery

## 2011-12-01 DIAGNOSIS — I714 Abdominal aortic aneurysm, without rupture: Secondary | ICD-10-CM | POA: Diagnosis not present

## 2011-12-02 ENCOUNTER — Other Ambulatory Visit: Payer: TRICARE For Life (TFL)

## 2011-12-06 ENCOUNTER — Ambulatory Visit
Admission: RE | Admit: 2011-12-06 | Discharge: 2011-12-06 | Disposition: A | Discharge status: Home / Self Care | Payer: Medicare Other | Source: Ambulatory Visit | Attending: Surgery | Admitting: Surgery

## 2011-12-06 DIAGNOSIS — Z01810 Encounter for preprocedural cardiovascular examination: Secondary | ICD-10-CM

## 2011-12-06 DIAGNOSIS — I714 Abdominal aortic aneurysm, without rupture: Secondary | ICD-10-CM

## 2011-12-06 DIAGNOSIS — Z01818 Encounter for other preprocedural examination: Secondary | ICD-10-CM | POA: Diagnosis not present

## 2011-12-06 DIAGNOSIS — I712 Thoracic aortic aneurysm, without rupture: Secondary | ICD-10-CM

## 2011-12-06 DIAGNOSIS — I70209 Unspecified atherosclerosis of native arteries of extremities, unspecified extremity: Secondary | ICD-10-CM | POA: Diagnosis not present

## 2011-12-06 DIAGNOSIS — R911 Solitary pulmonary nodule: Secondary | ICD-10-CM | POA: Diagnosis not present

## 2011-12-06 DIAGNOSIS — I70219 Atherosclerosis of native arteries of extremities with intermittent claudication, unspecified extremity: Secondary | ICD-10-CM

## 2011-12-06 DIAGNOSIS — I745 Embolism and thrombosis of iliac artery: Secondary | ICD-10-CM | POA: Diagnosis not present

## 2011-12-06 MED ORDER — IOHEXOL 350 MG/ML SOLN
80.0000 mL | Freq: Once | INTRAVENOUS | Status: AC | PRN
  Administered 2011-12-06: 80 mL via INTRAVENOUS

## 2011-12-13 NOTE — Procedures (Unsigned)
CAROTID DUPLEX EXAM  INDICATION:  Carotid stenosis, preoperative cardiovascular exam  HISTORY: Diabetes:  No Cardiac:  No Hypertension:  Yes Smoking:  Currently Previous Surgery:  No carotid intervention CV History:  Currently asymptomatic Amaurosis Fugax No, Paresthesias No, Hemiparesis No                                      RIGHT             LEFT Brachial systolic pressure:         126               124 Brachial Doppler waveforms:         WNL               WNL Vertebral direction of flow:        Antegrade         Antegrade DUPLEX VELOCITIES (cm/sec) CCA peak systolic                   115               [id number] ECA peak systolic                   92                88 ICA peak systolic                   87                86 ICA end diastolic                   18                21 PLAQUE MORPHOLOGY:                  Heterogeneous     Heterogeneous PLAQUE AMOUNT:                      Mild              Mild PLAQUE LOCATION:                    CCA/ICA           CCA/ICA  IMPRESSION: 1. Bilateral internal carotid artery stenosis present in the 1%-39%     range. 2. Bilateral external carotid arteries appear patent. 3. Bilateral vertebral arteries are patent and antegrade.  ___________________________________________ V. Leia Alf, MD  SH/MEDQ  D:  11/29/2011  T:  11/29/2011  Job:  [id number]

## 2011-12-20 ENCOUNTER — Ambulatory Visit: Payer: Medicare Other | Admitting: Internal Medicine

## 2011-12-24 ENCOUNTER — Encounter: Payer: Self-pay | Admitting: Internal Medicine

## 2011-12-24 ENCOUNTER — Ambulatory Visit (INDEPENDENT_AMBULATORY_CARE_PROVIDER_SITE_OTHER): Payer: Medicare Other | Admitting: Internal Medicine

## 2011-12-24 VITALS — BP 118/62 | HR 95 | Temp 97.5°F | Wt 168.0 lb

## 2011-12-24 DIAGNOSIS — I70219 Atherosclerosis of native arteries of extremities with intermittent claudication, unspecified extremity: Secondary | ICD-10-CM | POA: Diagnosis not present

## 2011-12-24 DIAGNOSIS — I10 Essential (primary) hypertension: Secondary | ICD-10-CM

## 2011-12-24 DIAGNOSIS — F4321 Adjustment disorder with depressed mood: Secondary | ICD-10-CM

## 2011-12-24 DIAGNOSIS — Z85820 Personal history of malignant melanoma of skin: Secondary | ICD-10-CM | POA: Diagnosis not present

## 2011-12-24 NOTE — Progress Notes (Signed)
Subjective:    Patient ID: Tiffany Martinez, female    DOB: 1934-01-21, 78 y.o.   MRN: [id number]  HPI Has been seeing Dr Trula Slade Hasn't made a decision about repair of aneurysm (which is small) and address claudication Leg pain is not a big problem---still gets a heavy leg with walking only a short distance (especially if she pushes it) Thinking of getting a second opinion  Still smoking a little--but not every day Can't smoke on [place] Has new car---won't smoke in it Tried the [address] [place] and didn't like it  Still gets some depression from grieving This is when she usually grabs a cigarette Does feel better if she keeps busy Has joined some groups ---doing her art work again Would like to travel--but can't handle stairs  No chest pain No SOB No headaches  Current Outpatient Prescriptions on File Prior to Visit  Medication Sig Dispense Refill  . aspirin 81 MG tablet Take 81 mg by mouth daily.        Marland Kitchen atorvastatin (LIPITOR) 20 MG tablet Take 20 mg by mouth daily.        . nitroGLYCERIN (NITROSTAT) 0.4 MG SL tablet Place 0.4 mg under the tongue every 5 (five) minutes as needed.        . triamterene-hydrochlorothiazide (MAXZIDE-25) 37.5-25 MG per tablet take 1 tablet by mouth once daily for high blood pressure  30 tablet  12    No Known Allergies  Past Medical History  Diagnosis Date  . Anxiety   . Hyperlipidemia   . Hypertension   . Anemia   . Chronic venous insufficiency   . PAD (peripheral artery disease) 2012    severe R common iliac stenosis, ABI 0.73, mid L common iliac stenosis ABI 1.1  . AAA (abdominal aortic aneurysm) 2012    3.9cm, rec rpt 1 yr  . Hx of colonoscopy   . PAD (peripheral artery disease)   . Melanoma     Right Arm    Past Surgical History  Procedure Date  . Tonsillectomy and adenoidectomy 1960  . Knee arthroscopy 1998    right  . Cardiovascular stress test 2008    negative  . Dobutamine stress echo 11/11    negative  .  Melanoma excision 11/09/10    wide excision right upper arm    Family History  Problem Relation Age of Onset  . Alzheimer's disease Mother   . Pancreatic cancer Father   . Coronary artery disease Neg Hx   . Diabetes Neg Hx   . Cancer Neg Hx     breast or colon    History   Social History  . Marital Status: Widowed    Spouse Name: N/A    Number of Children: 4  . Years of Education: N/A   Occupational History  . retired- Therapist, sports,     Social History Main Topics  . Smoking status: Current Some Day Smoker    Types: Cigarettes  . Smokeless tobacco: Never Used   Comment: pt states that she has 2-3 cigarettes per day   . Alcohol Use: Yes     regular wine with dinner  . Drug Use: No  . Sexually Active: Not on file   Other Topics Concern  . Not on file   Social History Narrative   Widowed 2/11 -2nd for him , 1st for her. 4 step sons. 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 No distress.  Neck: Normal range of motion. Neck supple. No thyromegaly present.  Cardiovascular: Normal rate, regular rhythm and normal heart sounds.  Exam reveals no gallop.   No murmur heard.      Pulse absent in right foot 1+ on left  Pulmonary/Chest: Effort normal and breath sounds normal. No respiratory distress. She has no wheezes. She has no rales.  Musculoskeletal: She exhibits no edema and no tenderness.  Lymphadenopathy:    She has no cervical adenopathy.  Psychiatric: She has a normal mood and affect. Her behavior is normal. Thought content normal.          Assessment & Plan:

## 2011-12-24 NOTE — Assessment & Plan Note (Signed)
occ grieving but nothing serious No Rx needed

## 2011-12-24 NOTE — Assessment & Plan Note (Signed)
Fairly highly symptomatic and limited Needs to have procedure Discussed asking Dr Trula Slade for who to see for second opinion to be sure of the best approach

## 2011-12-24 NOTE — Assessment & Plan Note (Signed)
BP Readings from Last 3 Encounters:  12/24/11 118/62  11/29/11 142/62  11/08/11 131/57   Good control No change needed

## 2011-12-24 NOTE — Assessment & Plan Note (Signed)
Lung nodule on workup  Will need repeat in the coming months

## 2011-12-27 ENCOUNTER — Telehealth: Payer: Self-pay | Admitting: *Deleted

## 2011-12-27 NOTE — Telephone Encounter (Signed)
Patient called in to request a 2nd opinion for her purposed surgery. I discussed this with Dr. Trula Slade and he agreed that 2nd opinion was welcomed in this case. He wishes to refer her to Dr. Sammuel Hines at [facility] for 2nd surgical opinion.    Our office will make this appt.

## 2011-12-31 DIAGNOSIS — I7090 Unspecified atherosclerosis: Secondary | ICD-10-CM | POA: Diagnosis not present

## 2011-12-31 DIAGNOSIS — I714 Abdominal aortic aneurysm, without rupture: Secondary | ICD-10-CM | POA: Diagnosis not present

## 2011-12-31 DIAGNOSIS — I771 Stricture of artery: Secondary | ICD-10-CM | POA: Diagnosis not present

## 2012-01-13 DIAGNOSIS — C4360 Malignant melanoma of unspecified upper limb, including shoulder: Secondary | ICD-10-CM | POA: Diagnosis not present

## 2012-01-13 DIAGNOSIS — I872 Venous insufficiency (chronic) (peripheral): Secondary | ICD-10-CM | POA: Diagnosis not present

## 2012-01-13 DIAGNOSIS — Z79899 Other long term (current) drug therapy: Secondary | ICD-10-CM | POA: Diagnosis not present

## 2012-01-13 DIAGNOSIS — I10 Essential (primary) hypertension: Secondary | ICD-10-CM | POA: Diagnosis not present

## 2012-01-13 DIAGNOSIS — I714 Abdominal aortic aneurysm, without rupture: Secondary | ICD-10-CM | POA: Diagnosis not present

## 2012-01-13 DIAGNOSIS — F411 Generalized anxiety disorder: Secondary | ICD-10-CM | POA: Diagnosis not present

## 2012-01-13 DIAGNOSIS — D649 Anemia, unspecified: Secondary | ICD-10-CM | POA: Diagnosis not present

## 2012-01-13 DIAGNOSIS — I719 Aortic aneurysm of unspecified site, without rupture: Secondary | ICD-10-CM | POA: Diagnosis not present

## 2012-01-13 DIAGNOSIS — I723 Aneurysm of iliac artery: Secondary | ICD-10-CM | POA: Diagnosis not present

## 2012-01-13 DIAGNOSIS — E785 Hyperlipidemia, unspecified: Secondary | ICD-10-CM | POA: Diagnosis not present

## 2012-01-13 DIAGNOSIS — I70219 Atherosclerosis of native arteries of extremities with intermittent claudication, unspecified extremity: Secondary | ICD-10-CM | POA: Diagnosis not present

## 2012-01-13 DIAGNOSIS — I739 Peripheral vascular disease, unspecified: Secondary | ICD-10-CM | POA: Diagnosis not present

## 2012-01-14 ENCOUNTER — Encounter: Payer: Self-pay | Admitting: Internal Medicine

## 2012-01-14 ENCOUNTER — Ambulatory Visit (INDEPENDENT_AMBULATORY_CARE_PROVIDER_SITE_OTHER): Payer: Medicare Other | Admitting: Internal Medicine

## 2012-01-14 VITALS — BP 122/60 | HR 64 | Temp 98.5°F | Resp 14 | Wt 167.0 lb

## 2012-01-14 DIAGNOSIS — J209 Acute bronchitis, unspecified: Secondary | ICD-10-CM | POA: Diagnosis not present

## 2012-01-14 MED ORDER — AMOXICILLIN-POT CLAVULANATE 875-125 MG PO TABS: 1.0000 | ORAL_TABLET | Freq: Two times a day (BID) | ORAL | Status: AC

## 2012-01-14 NOTE — Progress Notes (Signed)
Subjective:    Patient ID: Tiffany Martinez, female    DOB: Oct 05, 1933, 78 y.o.   MRN: [id number]  HPI Has been sick for 10-14 days or so Started with cold and seemed to be better--then has recurred Felt bad and saw Licensed conveyancer at [facility] 2 days ago Productive cough now---clear with occ purulence Some low grade fever  No sore throat now Glands had been enlarged at first--but improved Used tylenol, mucinex with some success at first Some SOB Has frontal headache when bending over  Current Outpatient Prescriptions on File Prior to Visit  Medication Sig Dispense Refill  . aspirin 81 MG tablet Take 81 mg by mouth daily.        Marland Kitchen atorvastatin (LIPITOR) 20 MG tablet Take 20 mg by mouth daily.        . nitroGLYCERIN (NITROSTAT) 0.4 MG SL tablet Place 0.4 mg under the tongue every 5 (five) minutes as needed.        . triamterene-hydrochlorothiazide (MAXZIDE-25) 37.5-25 MG per tablet take 1 tablet by mouth once daily for high blood pressure  30 tablet  12    No Known Allergies  Past Medical History  Diagnosis Date  . Anxiety   . Hyperlipidemia   . Hypertension   . Anemia   . Chronic venous insufficiency   . PAD (peripheral artery disease) 2012    severe R common iliac stenosis, ABI 0.73, mid L common iliac stenosis ABI 1.1  . AAA (abdominal aortic aneurysm) 2012    3.9cm, rec rpt 1 yr  . Hx of colonoscopy   . PAD (peripheral artery disease)   . Melanoma     Right Arm    Past Surgical History  Procedure Date  . Tonsillectomy and adenoidectomy 1960  . Knee arthroscopy 1998    right  . Cardiovascular stress test 2008    negative  . Dobutamine stress echo 11/11    negative  . Melanoma excision 11/09/10    wide excision right upper arm    Family History  Problem Relation Age of Onset  . Alzheimer's disease Mother   . Pancreatic cancer Father   . Coronary artery disease Neg Hx   . Diabetes Neg Hx   . Cancer Neg Hx     breast or colon    History   Social History    . Marital Status: Widowed    Spouse Name: N/A    Number of Children: 4  . Years of Education: N/A   Occupational History  . retired- Therapist, sports,     Social History Main Topics  . Smoking status: Current Some Day Smoker    Types: Cigarettes  . Smokeless tobacco: Never Used   Comment: pt states that she has 2-3 cigarettes per day   . Alcohol Use: Yes     regular wine with dinner  . Drug Use: No  . Sexually Active: Not on file   Other Topics Concern  . Not on file   Social History Narrative   Widowed 2/11 -2nd for him , 1st for her. 4 step sons. 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 Now will be having surgery later next month Has had some PND Stopped smoking about 10 days ago--hopes to stay off    Objective:   Physical Exam  Constitutional:  She appears well-developed and well-nourished. No distress.  HENT:  Mouth/Throat: Oropharynx is clear and moist. No oropharyngeal exudate.       No sinus tenderness TMs normal Moderate congestion  Neck: Normal range of motion. Neck supple.  Pulmonary/Chest: Effort normal. No respiratory distress. She has no wheezes. She has no rales.       Slight basilar rhonchi  Lymphadenopathy:    She has no cervical adenopathy.          Assessment & Plan:

## 2012-01-14 NOTE — Assessment & Plan Note (Signed)
May have element of sinusitis Nothing to suggest pneumonia but will use augmentin instead of amoxil to be more aggressive given smoking status and upcoming vascular surgery  Would change to levofloxacin if not better by next week

## 2012-01-26 ENCOUNTER — Encounter: Payer: Self-pay | Admitting: Internal Medicine

## 2012-01-26 ENCOUNTER — Ambulatory Visit (INDEPENDENT_AMBULATORY_CARE_PROVIDER_SITE_OTHER): Payer: Medicare Other | Admitting: Internal Medicine

## 2012-01-26 ENCOUNTER — Telehealth: Payer: Self-pay

## 2012-01-26 VITALS — BP 118/70 | HR 77 | Temp 97.8°F | Wt 167.0 lb

## 2012-01-26 DIAGNOSIS — R300 Dysuria: Secondary | ICD-10-CM

## 2012-01-26 DIAGNOSIS — K130 Diseases of lips: Secondary | ICD-10-CM | POA: Diagnosis not present

## 2012-01-26 DIAGNOSIS — R05 Cough: Secondary | ICD-10-CM

## 2012-01-26 LAB — POCT URINALYSIS DIPSTICK
Ketones, UA: NEGATIVE
pH, UA: 6.5

## 2012-01-26 MED ORDER — KETOCONAZOLE 2 % EX CREA: TOPICAL_CREAM | Freq: Two times a day (BID) | CUTANEOUS | Status: DC

## 2012-01-26 NOTE — Assessment & Plan Note (Signed)
Seems to have yeast infection Will Rx ketoconazole If not better, would try fluconazole

## 2012-01-26 NOTE — Telephone Encounter (Signed)
Pt still has productive cough with clear phlegm; finished Augmentin. Pt  has pain upon urination with perineal irritation. Pt scheduled aneurysm and blocked artery surgery on 02/14/12. Pt scheduled to see Dr Silvio Pate today at 12:15 pm.

## 2012-01-26 NOTE — Assessment & Plan Note (Signed)
Ongoing cough but doesn't feel sick I suspect this is from stopping smoking Nothing to suggest ongoing infection now Will defer CXR as will be getting one preop next week Congratulations on stopping smoking Would consider antibiotic again if mucus gets purulent

## 2012-01-26 NOTE — Progress Notes (Signed)
Subjective:    Patient ID: Elodia Florence, female    DOB: Feb 18, 1934, 78 y.o.   MRN: [id number]  HPI Finished her antibiotic Never clearly got better Still lots of cough---with clear mucus No fever Some SOB---even just rushing taking a shower Now taking things very slowly Stopped smoking around 2 weeks ago Doesn't feel sick  Now with urinary symptoms No increased urinary frequency--already goes a lot Does have "hot, burning" vaginal sensation No discharge Feels that she has a rash Tried some neosporin without help Some itching  Current Outpatient Prescriptions on File Prior to Visit  Medication Sig Dispense Refill  . aspirin 81 MG tablet Take 81 mg by mouth daily.        Marland Kitchen atorvastatin (LIPITOR) 20 MG tablet Take 20 mg by mouth daily.        . nitroGLYCERIN (NITROSTAT) 0.4 MG SL tablet Place 0.4 mg under the tongue every 5 (five) minutes as needed.        . triamterene-hydrochlorothiazide (MAXZIDE-25) 37.5-25 MG per tablet take 1 tablet by mouth once daily for high blood pressure  30 tablet  12    No Known Allergies  Past Medical History  Diagnosis Date  . Anxiety   . Hyperlipidemia   . Hypertension   . Anemia   . Chronic venous insufficiency   . PAD (peripheral artery disease) 2012    severe R common iliac stenosis, ABI 0.73, mid L common iliac stenosis ABI 1.1  . AAA (abdominal aortic aneurysm) 2012    3.9cm, rec rpt 1 yr  . Hx of colonoscopy   . PAD (peripheral artery disease)   . Melanoma     Right Arm    Past Surgical History  Procedure Date  . Tonsillectomy and adenoidectomy 1960  . Knee arthroscopy 1998    right  . Cardiovascular stress test 2008    negative  . Dobutamine stress echo 11/11    negative  . Melanoma excision 11/09/10    wide excision right upper arm    Family History  Problem Relation Age of Onset  . Alzheimer's disease Mother   . Pancreatic cancer Father   . Coronary artery disease Neg Hx   . Diabetes Neg Hx   . Cancer Neg Hx      breast or colon    History   Social History  . Marital Status: Widowed    Spouse Name: N/A    Number of Children: 4  . Years of Education: N/A   Occupational History  . retired- Therapist, sports,     Social History Main Topics  . Smoking status: Former Smoker    Types: Cigarettes    Quit date: 01/12/2012  . Smokeless tobacco: Never Used   Comment: pt states that she has 2-3 cigarettes per day   . Alcohol Use: Yes     regular wine with dinner  . Drug Use: No  . Sexually Active: Not on file   Other Topics Concern  . Not on file   Social History Narrative   Widowed 2/11 -2nd for him , 1st for her. 4 step sons. 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 Hopes to have this under spinal Still with very limiting claudication Appetite is fine    Objective:   Physical Exam  Constitutional: She appears  well-developed and well-nourished. No distress.  Neck: Normal range of motion. Neck supple.  Cardiovascular: Normal rate, regular rhythm and normal heart sounds.  Exam reveals no gallop.   No murmur heard. Pulmonary/Chest: Effort normal and breath sounds normal. No respiratory distress. She has no wheezes. She has no rales.       Very slight basilar rhonchi on left--then cleared after cough  Abdominal: Soft. There is no tenderness.  Musculoskeletal: She exhibits no edema.  Lymphadenopathy:    She has no cervical adenopathy.  Skin:       Mild intertriginous rash in perineum          Assessment & Plan:

## 2012-01-31 DIAGNOSIS — I059 Rheumatic mitral valve disease, unspecified: Secondary | ICD-10-CM | POA: Diagnosis not present

## 2012-01-31 DIAGNOSIS — Z01810 Encounter for preprocedural cardiovascular examination: Secondary | ICD-10-CM | POA: Diagnosis not present

## 2012-01-31 DIAGNOSIS — I739 Peripheral vascular disease, unspecified: Secondary | ICD-10-CM | POA: Diagnosis not present

## 2012-01-31 DIAGNOSIS — I70219 Atherosclerosis of native arteries of extremities with intermittent claudication, unspecified extremity: Secondary | ICD-10-CM | POA: Diagnosis not present

## 2012-01-31 DIAGNOSIS — Z01818 Encounter for other preprocedural examination: Secondary | ICD-10-CM | POA: Diagnosis not present

## 2012-01-31 DIAGNOSIS — Z0183 Encounter for blood typing: Secondary | ICD-10-CM | POA: Diagnosis not present

## 2012-01-31 DIAGNOSIS — I714 Abdominal aortic aneurysm, without rupture: Secondary | ICD-10-CM | POA: Diagnosis not present

## 2012-01-31 DIAGNOSIS — F17200 Nicotine dependence, unspecified, uncomplicated: Secondary | ICD-10-CM | POA: Diagnosis not present

## 2012-01-31 DIAGNOSIS — I10 Essential (primary) hypertension: Secondary | ICD-10-CM | POA: Diagnosis not present

## 2012-01-31 DIAGNOSIS — R9431 Abnormal electrocardiogram [ECG] [EKG]: Secondary | ICD-10-CM | POA: Diagnosis not present

## 2012-01-31 DIAGNOSIS — Z79899 Other long term (current) drug therapy: Secondary | ICD-10-CM | POA: Diagnosis not present

## 2012-01-31 DIAGNOSIS — I359 Nonrheumatic aortic valve disorder, unspecified: Secondary | ICD-10-CM | POA: Diagnosis not present

## 2012-01-31 DIAGNOSIS — I2789 Other specified pulmonary heart diseases: Secondary | ICD-10-CM | POA: Diagnosis not present

## 2012-02-02 DIAGNOSIS — Z0183 Encounter for blood typing: Secondary | ICD-10-CM | POA: Diagnosis not present

## 2012-02-02 DIAGNOSIS — Z01818 Encounter for other preprocedural examination: Secondary | ICD-10-CM | POA: Diagnosis not present

## 2012-02-02 DIAGNOSIS — Z79899 Other long term (current) drug therapy: Secondary | ICD-10-CM | POA: Diagnosis not present

## 2012-02-02 DIAGNOSIS — I70219 Atherosclerosis of native arteries of extremities with intermittent claudication, unspecified extremity: Secondary | ICD-10-CM | POA: Diagnosis not present

## 2012-02-02 DIAGNOSIS — R9431 Abnormal electrocardiogram [ECG] [EKG]: Secondary | ICD-10-CM | POA: Diagnosis not present

## 2012-02-04 ENCOUNTER — Telehealth: Payer: Self-pay

## 2012-02-04 NOTE — Telephone Encounter (Signed)
Pt wanted to make sure atorvastatin was on pts med list; she found out today that pharmacy has be getting atorvastatin refilled from her previous dr. Delight Stare with pt taking atorvastatin 20 mg one daily. Pt confimred that as correct and pt will request pharmacy to contact Dr Silvio Pate the next refill date.

## 2012-02-11 ENCOUNTER — Other Ambulatory Visit: Payer: Self-pay | Admitting: Internal Medicine

## 2012-02-11 MED ORDER — TRIAMTERENE-HCTZ 37.5-25 MG PO TABS: 1.0000 | ORAL_TABLET | Freq: Every day | ORAL | Status: DC

## 2012-02-11 NOTE — Telephone Encounter (Signed)
The pt called the triage line and is hoping to get a refill of Triamterene-hydrocholorothiazide and atrovastatin before next week.  She states she is having surgery on Wednesday and going to a nursing facility after.  She is hoping to have a good amount of her prescriptions on hand during this time.    Call back is - [phone]  Thanks!

## 2012-02-11 NOTE — Telephone Encounter (Signed)
rx sent to pharmacy by e-script  

## 2012-02-16 DIAGNOSIS — I10 Essential (primary) hypertension: Secondary | ICD-10-CM | POA: Diagnosis present

## 2012-02-16 DIAGNOSIS — I714 Abdominal aortic aneurysm, without rupture, unspecified: Secondary | ICD-10-CM | POA: Diagnosis not present

## 2012-02-16 DIAGNOSIS — E785 Hyperlipidemia, unspecified: Secondary | ICD-10-CM | POA: Diagnosis present

## 2012-02-16 DIAGNOSIS — F411 Generalized anxiety disorder: Secondary | ICD-10-CM | POA: Diagnosis present

## 2012-02-16 DIAGNOSIS — I70219 Atherosclerosis of native arteries of extremities with intermittent claudication, unspecified extremity: Secondary | ICD-10-CM | POA: Diagnosis present

## 2012-02-16 DIAGNOSIS — I872 Venous insufficiency (chronic) (peripheral): Secondary | ICD-10-CM | POA: Diagnosis present

## 2012-02-16 DIAGNOSIS — I498 Other specified cardiac arrhythmias: Secondary | ICD-10-CM | POA: Diagnosis not present

## 2012-02-16 DIAGNOSIS — Z85820 Personal history of malignant melanoma of skin: Secondary | ICD-10-CM | POA: Diagnosis not present

## 2012-02-16 DIAGNOSIS — I723 Aneurysm of iliac artery: Secondary | ICD-10-CM | POA: Diagnosis present

## 2012-02-16 DIAGNOSIS — Z87891 Personal history of nicotine dependence: Secondary | ICD-10-CM | POA: Diagnosis not present

## 2012-02-16 DIAGNOSIS — D649 Anemia, unspecified: Secondary | ICD-10-CM | POA: Diagnosis present

## 2012-02-16 DIAGNOSIS — I743 Embolism and thrombosis of arteries of the lower extremities: Secondary | ICD-10-CM | POA: Diagnosis not present

## 2012-02-16 HISTORY — PX: ABDOMINAL AORTIC ANEURYSM REPAIR: SHX42

## 2012-02-25 ENCOUNTER — Encounter: Payer: Self-pay | Admitting: Internal Medicine

## 2012-02-28 ENCOUNTER — Telehealth: Payer: Self-pay

## 2012-02-28 NOTE — Telephone Encounter (Signed)
Pt left v/m requesting verification surgeon sent post op report from [facility].Please advise.

## 2012-02-28 NOTE — Telephone Encounter (Signed)
Spoke with patient and advised results, that we did receive the discharge summary

## 2012-03-02 DIAGNOSIS — Z9889 Other specified postprocedural states: Secondary | ICD-10-CM | POA: Diagnosis not present

## 2012-03-02 DIAGNOSIS — I719 Aortic aneurysm of unspecified site, without rupture: Secondary | ICD-10-CM | POA: Diagnosis not present

## 2012-03-16 DIAGNOSIS — I10 Essential (primary) hypertension: Secondary | ICD-10-CM | POA: Diagnosis not present

## 2012-03-16 DIAGNOSIS — I723 Aneurysm of iliac artery: Secondary | ICD-10-CM | POA: Diagnosis not present

## 2012-03-16 DIAGNOSIS — I714 Abdominal aortic aneurysm, without rupture: Secondary | ICD-10-CM | POA: Diagnosis not present

## 2012-03-16 DIAGNOSIS — R5381 Other malaise: Secondary | ICD-10-CM | POA: Diagnosis not present

## 2012-03-16 DIAGNOSIS — Z79899 Other long term (current) drug therapy: Secondary | ICD-10-CM | POA: Diagnosis not present

## 2012-03-17 ENCOUNTER — Telehealth: Payer: Self-pay

## 2012-03-17 NOTE — Telephone Encounter (Signed)
Had endovascular repair No reason for low hgb  Needs further eval She wants me to initiate  Told her to come in on Monday at [facility], Please add that appt to my schedule

## 2012-03-17 NOTE — Telephone Encounter (Signed)
Pt saw Dr Sammuel Hines at [facility] 03/16/12 was told hgb was approximately 7 and advised to have blood transfusion. Pt wants to know what Dr Silvio Pate thinks pt should do?

## 2012-03-20 ENCOUNTER — Other Ambulatory Visit: Payer: Medicare Other

## 2012-03-20 ENCOUNTER — Ambulatory Visit: Payer: Medicare Other

## 2012-03-20 ENCOUNTER — Ambulatory Visit (INDEPENDENT_AMBULATORY_CARE_PROVIDER_SITE_OTHER): Payer: Medicare Other | Admitting: Internal Medicine

## 2012-03-20 ENCOUNTER — Encounter: Payer: Self-pay | Admitting: Internal Medicine

## 2012-03-20 VITALS — BP 128/70 | HR 64 | Temp 98.4°F | Wt 164.0 lb

## 2012-03-20 DIAGNOSIS — D649 Anemia, unspecified: Secondary | ICD-10-CM

## 2012-03-20 DIAGNOSIS — D531 Other megaloblastic anemias, not elsewhere classified: Secondary | ICD-10-CM

## 2012-03-20 DIAGNOSIS — D539 Nutritional anemia, unspecified: Secondary | ICD-10-CM

## 2012-03-20 DIAGNOSIS — Z23 Encounter for immunization: Secondary | ICD-10-CM | POA: Diagnosis not present

## 2012-03-20 DIAGNOSIS — D638 Anemia in other chronic diseases classified elsewhere: Secondary | ICD-10-CM | POA: Diagnosis not present

## 2012-03-20 LAB — FERRITIN: Ferritin: 72.2 ng/mL (ref 10.0–291.0)

## 2012-03-20 LAB — IBC PANEL
Saturation Ratios: 19 % — ABNORMAL LOW (ref 20.0–50.0)
Transferrin: 263.7 mg/dL (ref 212.0–360.0)

## 2012-03-20 LAB — CBC WITH DIFFERENTIAL/PLATELET
Basophils Absolute: 0.1 10*3/uL (ref 0.0–0.1)
Eosinophils Relative: 1.3 % (ref 0.0–5.0)
HCT: 32.7 % — ABNORMAL LOW (ref 36.0–46.0)
Hemoglobin: 11 g/dL — ABNORMAL LOW (ref 12.0–15.0)
Lymphs Abs: 8.9 10*3/uL — ABNORMAL HIGH (ref 0.7–4.0)
Monocytes Relative: 3.1 % (ref 3.0–12.0)
Neutro Abs: 4 10*3/uL (ref 1.4–7.7)
RDW: 15.3 % — ABNORMAL HIGH (ref 11.5–14.6)

## 2012-03-20 NOTE — Addendum Note (Signed)
Addended by: Despina Hidden on: 03/20/2012 02:35 PM   Modules accepted: Orders

## 2012-03-20 NOTE — Progress Notes (Signed)
Subjective:    Patient ID: Tiffany Martinez, female    DOB: February 18, 1934, 78 y.o.   MRN: [id number]  HPI Had endovascular repair at [facility] about 4 weeks ago Did well ---no sig bleeding Got beta blocker but had bradycardic reaction to this  Has been feeling fatigued lately Follow up and hemoglobin was 7.4 or something Recommended transfusion We discussed this and she decided to come in here first Repeat CT scan showed no bleeding but aneurysm is still present  No other bleeding---mucosal or apparently in stool or urine  Took it easy this weekend Feels some better  No chest pain No problems with breathing No edema  Current Outpatient Prescriptions on File Prior to Visit  Medication Sig Dispense Refill  . aspirin 81 MG tablet Take 81 mg by mouth daily.        Marland Kitchen atorvastatin (LIPITOR) 20 MG tablet take 1 tablet by mouth once daily  90 tablet  3  . nitroGLYCERIN (NITROSTAT) 0.4 MG SL tablet Place 0.4 mg under the tongue every 5 (five) minutes as needed.        . triamterene-hydrochlorothiazide (MAXZIDE-25) 37.5-25 MG per tablet Take 1 each (1 tablet total) by mouth daily.  90 tablet  3    No Known Allergies  Past Medical History  Diagnosis Date  . Anxiety   . Hyperlipidemia   . Hypertension   . Anemia   . Chronic venous insufficiency   . PAD (peripheral artery disease) 2012    severe R common iliac stenosis, ABI 0.73, mid L common iliac stenosis ABI 1.1  . AAA (abdominal aortic aneurysm) 2012    3.9cm, rec rpt 1 yr  . Hx of colonoscopy   . PAD (peripheral artery disease)   . Melanoma     Right Arm    Past Surgical History  Procedure Date  . Tonsillectomy and adenoidectomy 1960  . Knee arthroscopy 1998    right  . Cardiovascular stress test 2008    negative  . Dobutamine stress echo 11/11    negative  . Melanoma excision 11/09/10    wide excision right upper arm  . Abdominal aortic aneurysm repair 02/16/12    [facility]--Dr Sammuel Hines    Family History  Problem Relation  Age of Onset  . Alzheimer's disease Mother   . Pancreatic cancer Father   . Coronary artery disease Neg Hx   . Diabetes Neg Hx   . Cancer Neg Hx     breast or colon    History   Social History  . Marital Status: Widowed    Spouse Name: N/A    Number of Children: 4  . Years of Education: N/A   Occupational History  . retired- Therapist, sports,     Social History Main Topics  . Smoking status: Former Smoker    Types: Cigarettes    Quit date: 01/12/2012  . Smokeless tobacco: Never Used     Comment: pt states that she has 2-3 cigarettes per day   . Alcohol Use: Yes     Comment: regular wine with dinner  . Drug Use: No  . Sexually Active: Not on file   Other Topics Concern  . Not on file   Social History Narrative   Widowed 2/11 -2nd for him , 1st for her. 4 step sons. 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 No distress.  Neck: Normal range of motion. Neck supple. No thyromegaly present.  Cardiovascular: Normal rate, regular rhythm and normal heart sounds.  Exam reveals no gallop.   No murmur heard. Pulmonary/Chest: Effort normal and breath sounds normal. No respiratory distress. She has no wheezes. She has no rales.  Musculoskeletal: She exhibits no edema and no tenderness.  Lymphadenopathy:    She has no cervical adenopathy.  Psychiatric: She has a normal mood and affect. Her behavior is normal.          Assessment & Plan:

## 2012-03-20 NOTE — Assessment & Plan Note (Signed)
By report, severe now in the 7 range She has heart rate of 60 and other than mild fatigue, no real symptoms Has had long standing mild anemia (hemoglobin 11 or so) for years, high white count and low platelets. No signs of blood loss Persistent aneurysm would not account for that much blood---recent CT showed no leak also  Will check labs again Will set up with hematology if confirm the severe anemia

## 2012-03-21 LAB — CBC WITH DIFFERENTIAL/PLATELET
Eosinophils Relative: 4 % (ref 0–5)
HCT: 28 % — ABNORMAL LOW (ref 36.0–46.0)
MCH: 32.9 pg (ref 26.0–34.0)
MCV: 102.9 fL — ABNORMAL HIGH (ref 78.0–100.0)
RDW: 15 % (ref 11.5–15.5)

## 2012-03-22 ENCOUNTER — Telehealth: Payer: Self-pay

## 2012-03-22 DIAGNOSIS — D649 Anemia, unspecified: Secondary | ICD-10-CM

## 2012-03-22 NOTE — Telephone Encounter (Signed)
Could not get into result notes; pt left v/m has had phone issues but should be able to receive call now.

## 2012-03-30 ENCOUNTER — Ambulatory Visit: Payer: Self-pay | Admitting: Hematology and Oncology

## 2012-03-30 DIAGNOSIS — I739 Peripheral vascular disease, unspecified: Secondary | ICD-10-CM | POA: Diagnosis not present

## 2012-03-30 DIAGNOSIS — R5383 Other fatigue: Secondary | ICD-10-CM | POA: Diagnosis not present

## 2012-03-30 DIAGNOSIS — Z87891 Personal history of nicotine dependence: Secondary | ICD-10-CM | POA: Diagnosis not present

## 2012-03-30 DIAGNOSIS — Z7982 Long term (current) use of aspirin: Secondary | ICD-10-CM | POA: Diagnosis not present

## 2012-03-30 DIAGNOSIS — Z79899 Other long term (current) drug therapy: Secondary | ICD-10-CM | POA: Diagnosis not present

## 2012-03-30 DIAGNOSIS — R5381 Other malaise: Secondary | ICD-10-CM | POA: Diagnosis not present

## 2012-03-30 DIAGNOSIS — I714 Abdominal aortic aneurysm, without rupture: Secondary | ICD-10-CM | POA: Diagnosis not present

## 2012-03-30 DIAGNOSIS — D539 Nutritional anemia, unspecified: Secondary | ICD-10-CM | POA: Diagnosis not present

## 2012-03-30 LAB — CBC CANCER CENTER
Basophil %: 0.5 %
Eosinophil #: 0.1 x10 3/mm (ref 0.0–0.7)
HCT: 31.7 % — ABNORMAL LOW (ref 35.0–47.0)
HGB: 11.1 g/dL — ABNORMAL LOW (ref 12.0–16.0)
MCH: 38.7 pg — ABNORMAL HIGH (ref 26.0–34.0)
MCHC: 35.1 g/dL (ref 32.0–36.0)
MCV: 110 fL — ABNORMAL HIGH (ref 80–100)
Monocyte #: 0.4 x10 3/mm (ref 0.2–0.9)
Neutrophil #: 3 x10 3/mm (ref 1.4–6.5)
Neutrophil %: 25.8 %
RBC: 2.88 10*6/uL — ABNORMAL LOW (ref 3.80–5.20)
RDW: 15.1 % — ABNORMAL HIGH (ref 11.5–14.5)

## 2012-03-30 LAB — COMPREHENSIVE METABOLIC PANEL
Alkaline Phosphatase: 99 U/L (ref 50–136)
Anion Gap: 12 (ref 7–16)
BUN: 21 mg/dL — ABNORMAL HIGH (ref 7–18)
Calcium, Total: 9.8 mg/dL (ref 8.5–10.1)
Co2: 25 mmol/L (ref 21–32)
EGFR (African American): 57 — ABNORMAL LOW
Osmolality: 282 (ref 275–301)
SGPT (ALT): 30 U/L (ref 12–78)

## 2012-03-30 LAB — GAMMA GT: GGT: 26 U/L (ref 5–85)

## 2012-04-04 DIAGNOSIS — Z79899 Other long term (current) drug therapy: Secondary | ICD-10-CM | POA: Diagnosis not present

## 2012-04-04 DIAGNOSIS — I714 Abdominal aortic aneurysm, without rupture: Secondary | ICD-10-CM | POA: Diagnosis not present

## 2012-04-04 DIAGNOSIS — I739 Peripheral vascular disease, unspecified: Secondary | ICD-10-CM | POA: Diagnosis not present

## 2012-04-04 DIAGNOSIS — D539 Nutritional anemia, unspecified: Secondary | ICD-10-CM | POA: Diagnosis not present

## 2012-04-04 DIAGNOSIS — R5383 Other fatigue: Secondary | ICD-10-CM | POA: Diagnosis not present

## 2012-04-04 DIAGNOSIS — Z87891 Personal history of nicotine dependence: Secondary | ICD-10-CM | POA: Diagnosis not present

## 2012-04-11 DIAGNOSIS — Z79899 Other long term (current) drug therapy: Secondary | ICD-10-CM | POA: Diagnosis not present

## 2012-04-11 DIAGNOSIS — D539 Nutritional anemia, unspecified: Secondary | ICD-10-CM | POA: Diagnosis not present

## 2012-04-11 DIAGNOSIS — R5381 Other malaise: Secondary | ICD-10-CM | POA: Diagnosis not present

## 2012-04-11 DIAGNOSIS — Z87891 Personal history of nicotine dependence: Secondary | ICD-10-CM | POA: Diagnosis not present

## 2012-04-11 DIAGNOSIS — I739 Peripheral vascular disease, unspecified: Secondary | ICD-10-CM | POA: Diagnosis not present

## 2012-04-11 DIAGNOSIS — I714 Abdominal aortic aneurysm, without rupture: Secondary | ICD-10-CM | POA: Diagnosis not present

## 2012-04-16 ENCOUNTER — Ambulatory Visit: Payer: Self-pay | Admitting: Hematology and Oncology

## 2012-04-16 DIAGNOSIS — Z79899 Other long term (current) drug therapy: Secondary | ICD-10-CM | POA: Diagnosis not present

## 2012-04-16 DIAGNOSIS — Z7982 Long term (current) use of aspirin: Secondary | ICD-10-CM | POA: Diagnosis not present

## 2012-04-16 DIAGNOSIS — Z87891 Personal history of nicotine dependence: Secondary | ICD-10-CM | POA: Diagnosis not present

## 2012-04-16 DIAGNOSIS — I739 Peripheral vascular disease, unspecified: Secondary | ICD-10-CM | POA: Diagnosis not present

## 2012-04-16 DIAGNOSIS — I714 Abdominal aortic aneurysm, without rupture: Secondary | ICD-10-CM | POA: Diagnosis not present

## 2012-04-16 DIAGNOSIS — D539 Nutritional anemia, unspecified: Secondary | ICD-10-CM | POA: Diagnosis not present

## 2012-04-16 DIAGNOSIS — E538 Deficiency of other specified B group vitamins: Secondary | ICD-10-CM | POA: Diagnosis not present

## 2012-04-25 DIAGNOSIS — E538 Deficiency of other specified B group vitamins: Secondary | ICD-10-CM | POA: Diagnosis not present

## 2012-04-25 DIAGNOSIS — D539 Nutritional anemia, unspecified: Secondary | ICD-10-CM | POA: Diagnosis not present

## 2012-04-25 LAB — CBC CANCER CENTER
Basophil #: 0.1 x10 3/mm (ref 0.0–0.1)
Eosinophil #: 0.2 x10 3/mm (ref 0.0–0.7)
Eosinophil %: 1.1 %
Lymphocyte %: 66.9 %
MCH: 37.4 pg — ABNORMAL HIGH (ref 26.0–34.0)
MCHC: 35.3 g/dL (ref 32.0–36.0)
MCV: 106 fL — ABNORMAL HIGH (ref 80–100)
Monocyte #: 0.5 x10 3/mm (ref 0.2–0.9)
Neutrophil %: 28 %
Platelet: 194 x10 3/mm (ref 150–440)
RBC: 3.07 10*6/uL — ABNORMAL LOW (ref 3.80–5.20)
RDW: 14.3 % (ref 11.5–14.5)

## 2012-05-17 ENCOUNTER — Ambulatory Visit: Payer: Self-pay | Admitting: Hematology and Oncology

## 2012-05-17 DIAGNOSIS — C9110 Chronic lymphocytic leukemia of B-cell type not having achieved remission: Secondary | ICD-10-CM

## 2012-05-17 DIAGNOSIS — D539 Nutritional anemia, unspecified: Secondary | ICD-10-CM | POA: Diagnosis not present

## 2012-05-17 HISTORY — DX: Chronic lymphocytic leukemia of B-cell type not having achieved remission: C91.10

## 2012-06-17 ENCOUNTER — Ambulatory Visit: Payer: Self-pay | Admitting: Hematology and Oncology

## 2012-06-17 DIAGNOSIS — D539 Nutritional anemia, unspecified: Secondary | ICD-10-CM | POA: Diagnosis not present

## 2012-06-17 DIAGNOSIS — Z95818 Presence of other cardiac implants and grafts: Secondary | ICD-10-CM | POA: Diagnosis not present

## 2012-06-17 DIAGNOSIS — D72829 Elevated white blood cell count, unspecified: Secondary | ICD-10-CM | POA: Diagnosis not present

## 2012-06-17 DIAGNOSIS — Z79899 Other long term (current) drug therapy: Secondary | ICD-10-CM | POA: Diagnosis not present

## 2012-06-17 DIAGNOSIS — N289 Disorder of kidney and ureter, unspecified: Secondary | ICD-10-CM | POA: Diagnosis not present

## 2012-06-17 DIAGNOSIS — Z7982 Long term (current) use of aspirin: Secondary | ICD-10-CM | POA: Diagnosis not present

## 2012-06-17 DIAGNOSIS — I739 Peripheral vascular disease, unspecified: Secondary | ICD-10-CM | POA: Diagnosis not present

## 2012-06-20 DIAGNOSIS — D72829 Elevated white blood cell count, unspecified: Secondary | ICD-10-CM | POA: Diagnosis not present

## 2012-06-20 DIAGNOSIS — D539 Nutritional anemia, unspecified: Secondary | ICD-10-CM | POA: Diagnosis not present

## 2012-06-20 LAB — CBC CANCER CENTER
Basophil #: 0.1 x10 3/mm (ref 0.0–0.1)
Basophil %: 0.8 %
Eosinophil #: 0.2 x10 3/mm (ref 0.0–0.7)
Eosinophil %: 1.2 %
HGB: 11.5 g/dL — ABNORMAL LOW (ref 12.0–16.0)
MCH: 37.4 pg — ABNORMAL HIGH (ref 26.0–34.0)
Monocyte #: 0.5 x10 3/mm (ref 0.2–0.9)
Monocyte %: 3.2 %
Neutrophil %: 24.2 %
Platelet: 172 x10 3/mm (ref 150–440)
RBC: 3.09 10*6/uL — ABNORMAL LOW (ref 3.80–5.20)
RDW: 15 % — ABNORMAL HIGH (ref 11.5–14.5)

## 2012-06-20 LAB — COMPREHENSIVE METABOLIC PANEL
Alkaline Phosphatase: 103 U/L (ref 50–136)
Anion Gap: 11 (ref 7–16)
BUN: 23 mg/dL — ABNORMAL HIGH (ref 7–18)
Calcium, Total: 8.6 mg/dL (ref 8.5–10.1)
Chloride: 103 mmol/L (ref 98–107)
EGFR (Non-African Amer.): 49 — ABNORMAL LOW
Osmolality: 288 (ref 275–301)
Potassium: 3.8 mmol/L (ref 3.5–5.1)
SGOT(AST): 24 U/L (ref 15–37)
Sodium: 142 mmol/L (ref 136–145)
Total Protein: 7.1 g/dL (ref 6.4–8.2)

## 2012-06-22 DIAGNOSIS — Z79899 Other long term (current) drug therapy: Secondary | ICD-10-CM | POA: Diagnosis not present

## 2012-06-22 DIAGNOSIS — Z09 Encounter for follow-up examination after completed treatment for conditions other than malignant neoplasm: Secondary | ICD-10-CM | POA: Diagnosis not present

## 2012-06-22 DIAGNOSIS — I723 Aneurysm of iliac artery: Secondary | ICD-10-CM | POA: Diagnosis not present

## 2012-06-22 DIAGNOSIS — I714 Abdominal aortic aneurysm, without rupture, unspecified: Secondary | ICD-10-CM | POA: Diagnosis not present

## 2012-07-07 ENCOUNTER — Ambulatory Visit: Payer: Medicare Other

## 2012-07-07 ENCOUNTER — Encounter: Payer: Self-pay | Admitting: Internal Medicine

## 2012-07-07 ENCOUNTER — Ambulatory Visit (INDEPENDENT_AMBULATORY_CARE_PROVIDER_SITE_OTHER): Payer: Medicare Other | Admitting: Internal Medicine

## 2012-07-07 VITALS — BP 138/62 | HR 87 | Temp 97.7°F | Ht 65.0 in | Wt 171.0 lb

## 2012-07-07 DIAGNOSIS — Z0000 Encounter for general adult medical examination without abnormal findings: Secondary | ICD-10-CM | POA: Diagnosis not present

## 2012-07-07 DIAGNOSIS — I10 Essential (primary) hypertension: Secondary | ICD-10-CM

## 2012-07-07 DIAGNOSIS — I739 Peripheral vascular disease, unspecified: Secondary | ICD-10-CM | POA: Diagnosis not present

## 2012-07-07 DIAGNOSIS — D649 Anemia, unspecified: Secondary | ICD-10-CM

## 2012-07-07 DIAGNOSIS — E785 Hyperlipidemia, unspecified: Secondary | ICD-10-CM

## 2012-07-07 LAB — LIPID PANEL
Cholesterol: 174 mg/dL (ref 0–200)
Total CHOL/HDL Ratio: 5
Triglycerides: 224 mg/dL — ABNORMAL HIGH (ref 0.0–149.0)
VLDL: 44.8 mg/dL — ABNORMAL HIGH (ref 0.0–40.0)

## 2012-07-07 LAB — BASIC METABOLIC PANEL
BUN: 22 mg/dL (ref 6–23)
CO2: 27 mEq/L (ref 19–32)
Calcium: 9.6 mg/dL (ref 8.4–10.5)
Chloride: 104 mEq/L (ref 96–112)
Creatinine, Ser: 1.1 mg/dL (ref 0.4–1.2)

## 2012-07-07 LAB — HEPATIC FUNCTION PANEL
ALT: 20 U/L (ref 0–35)
AST: 22 U/L (ref 0–37)
Albumin: 4.1 g/dL (ref 3.5–5.2)
Total Bilirubin: 0.4 mg/dL (ref 0.3–1.2)

## 2012-07-07 LAB — LDL CHOLESTEROL, DIRECT: Direct LDL: 87.1 mg/dL

## 2012-07-07 NOTE — Assessment & Plan Note (Signed)
BP Readings from Last 3 Encounters:  07/07/12 138/62  03/20/12 128/70  01/26/12 118/70   Good control No changes needed

## 2012-07-07 NOTE — Assessment & Plan Note (Signed)
Given vascular disease---will continue statin

## 2012-07-07 NOTE — Assessment & Plan Note (Signed)
Still with claudication but some better Needs to work on fitness program

## 2012-07-07 NOTE — Assessment & Plan Note (Signed)
I have personally reviewed the Medicare Annual Wellness questionnaire and have noted 1. The patient's medical and social history 2. Their use of alcohol, tobacco or illicit drugs 3. Their current medications and supplements 4. The patient's functional ability including ADL's, fall risks, home safety risks and hearing or visual             impairment. 5. Diet and physical activities 6. Evidence for depression or mood disorders  The patients weight, height, BMI and visual acuity have been recorded in the chart I have made referrals, counseling and provided education to the patient based review of the above and I have provided the pt with a written personalized care plan for preventive services.  I have provided you with a copy of your personalized plan for preventive services. Please take the time to review along with your updated medication list.  No cancer screening by her choice UTD with imms

## 2012-07-07 NOTE — Progress Notes (Signed)
Subjective:    Patient ID: Tiffany Martinez, female    DOB: August 21, 1933, 79 y.o.   MRN: [id number]  HPI Here for Wellness visit and follow up Did stop smoking Enjoys occasional alcohol Reviewed her other physicians Hearing is okay--did note left ear bleeding after anaesthesia for stent. Still some black wax discharge No sig vision problems No falls Mild mood issues--relates to being a widow Reviewed her advanced directives  Very frustrated about her aneurysm Had the stent is still not better Now can walk about 200 yards till she has to stop (which is better but still disappointing) Will need ongoing monitoring for endoleak Considering percutaneous cautery of vertebral artery that is causing the leak  Getting B12 shots from hematologist This doesn't appear to have changed her anemia  On BP med No headaches No chest pain No SOB  No problems with statin No myalgias or stomach trouble--though she is somewhat uncomfortable taking it  Current Outpatient Prescriptions on File Prior to Visit  Medication Sig Dispense Refill  . aspirin 81 MG tablet Take 81 mg by mouth daily.        Marland Kitchen atorvastatin (LIPITOR) 20 MG tablet take 1 tablet by mouth once daily  90 tablet  3  . nitroGLYCERIN (NITROSTAT) 0.4 MG SL tablet Place 0.4 mg under the tongue every 5 (five) minutes as needed.        . triamterene-hydrochlorothiazide (MAXZIDE-25) 37.5-25 MG per tablet Take 1 each (1 tablet total) by mouth daily.  90 tablet  3   No current facility-administered medications on file prior to visit.    No Known Allergies  Past Medical History  Diagnosis Date  . Anxiety   . Hyperlipidemia   . Hypertension   . Anemia   . Chronic venous insufficiency   . PAD (peripheral artery disease) 2012    severe R common iliac stenosis, ABI 0.73, mid L common iliac stenosis ABI 1.1  . AAA (abdominal aortic aneurysm) 2012    3.9cm, rec rpt 1 yr  . Hx of colonoscopy   . PAD (peripheral artery disease)   .  Melanoma     Right Arm    Past Surgical History  Procedure Laterality Date  . Tonsillectomy and adenoidectomy  1960  . Knee arthroscopy  1998    right  . Cardiovascular stress test  2008    negative  . Dobutamine stress echo  11/11    negative  . Melanoma excision  11/09/10    wide excision right upper arm  . Abdominal aortic aneurysm repair  02/16/12    [facility]--Dr Sammuel Hines    Family History  Problem Relation Age of Onset  . Alzheimer's disease Mother   . Pancreatic cancer Father   . Coronary artery disease Neg Hx   . Diabetes Neg Hx   . Cancer Neg Hx     breast or colon    History   Social History  . Marital Status: Widowed    Spouse Name: N/A    Number of Children: 4  . Years of Education: N/A   Occupational History  . retired- Therapist, sports,     Social History Main Topics  . Smoking status: Former Smoker    Types: Cigarettes    Quit date: 01/12/2012  . Smokeless tobacco: Never Used     Comment: pt states that she has 2-3 cigarettes per day   . Alcohol Use: Yes     Comment: regular wine with dinner  . Drug Use: No  . Sexually  Active: Not on file   Other Topics Concern  . Not on file   Social History Narrative   Widowed 2/11 -2nd for him , 1st for her. 4 step sons. 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   Requests DNR--papers done   Requests no feeding tube         Review of Systems Appetite is okay Weight is stable Sleeps well Bowels okay    Objective:   Physical Exam  Constitutional: She is oriented to person, place, and time. She appears well-developed and well-nourished. No distress.  HENT:  Tms fine Slight blackish material in left canal  Neck: Normal range of motion. Neck supple. No thyromegaly present.  Cardiovascular: Normal rate, regular rhythm and normal heart sounds.  Exam reveals no gallop.   No murmur heard. No pulse on left Normal  in right foot  Pulmonary/Chest: Effort normal and breath sounds normal. No respiratory distress. She has no wheezes.  Abdominal: Soft. There is no tenderness.  Musculoskeletal: She exhibits no edema and no tenderness.  Lymphadenopathy:    She has no cervical adenopathy.  Neurological: She is alert and oriented to person, place, and time.  President-- "Obama, Bush, ?" [phone] D-l-o-w Recall 3/3  Psychiatric: She has a normal mood and affect. Her behavior is normal.          Assessment & Plan:

## 2012-07-07 NOTE — Patient Instructions (Signed)
Please start an exercise program with Legrand Como

## 2012-07-07 NOTE — Assessment & Plan Note (Signed)
Usual hgb 11

## 2012-07-08 LAB — CBC WITH DIFFERENTIAL/PLATELET
Basophils Absolute: 0.1 10*3/uL (ref 0.0–0.1)
Basophils Relative: 0 % (ref 0–1)
HCT: 32.5 % — ABNORMAL LOW (ref 36.0–46.0)
Lymphocytes Relative: 70 % — ABNORMAL HIGH (ref 12–46)
MCHC: 33.9 g/dL (ref 30.0–36.0)
Neutro Abs: 4 10*3/uL (ref 1.7–7.7)
Neutrophils Relative %: 26 % — ABNORMAL LOW (ref 43–77)
Platelets: 114 10*3/uL — ABNORMAL LOW (ref 150–400)
RDW: 15.3 % (ref 11.5–15.5)
WBC: 15.7 10*3/uL — ABNORMAL HIGH (ref 4.0–10.5)

## 2012-07-10 ENCOUNTER — Encounter: Payer: Self-pay | Admitting: *Deleted

## 2012-07-15 ENCOUNTER — Ambulatory Visit: Payer: Self-pay | Admitting: Hematology and Oncology

## 2012-07-15 DIAGNOSIS — D7589 Other specified diseases of blood and blood-forming organs: Secondary | ICD-10-CM | POA: Diagnosis not present

## 2012-07-15 DIAGNOSIS — Z85820 Personal history of malignant melanoma of skin: Secondary | ICD-10-CM | POA: Diagnosis not present

## 2012-07-15 DIAGNOSIS — D539 Nutritional anemia, unspecified: Secondary | ICD-10-CM | POA: Diagnosis not present

## 2012-07-15 DIAGNOSIS — N289 Disorder of kidney and ureter, unspecified: Secondary | ICD-10-CM | POA: Diagnosis not present

## 2012-07-15 DIAGNOSIS — Z95818 Presence of other cardiac implants and grafts: Secondary | ICD-10-CM | POA: Diagnosis not present

## 2012-07-15 DIAGNOSIS — C9110 Chronic lymphocytic leukemia of B-cell type not having achieved remission: Secondary | ICD-10-CM | POA: Diagnosis not present

## 2012-07-15 DIAGNOSIS — Z7982 Long term (current) use of aspirin: Secondary | ICD-10-CM | POA: Diagnosis not present

## 2012-07-15 DIAGNOSIS — E538 Deficiency of other specified B group vitamins: Secondary | ICD-10-CM | POA: Diagnosis not present

## 2012-07-15 DIAGNOSIS — I739 Peripheral vascular disease, unspecified: Secondary | ICD-10-CM | POA: Diagnosis not present

## 2012-07-15 DIAGNOSIS — Z79899 Other long term (current) drug therapy: Secondary | ICD-10-CM | POA: Diagnosis not present

## 2012-07-27 DIAGNOSIS — E538 Deficiency of other specified B group vitamins: Secondary | ICD-10-CM | POA: Diagnosis not present

## 2012-07-27 DIAGNOSIS — C9110 Chronic lymphocytic leukemia of B-cell type not having achieved remission: Secondary | ICD-10-CM | POA: Diagnosis not present

## 2012-08-15 ENCOUNTER — Ambulatory Visit: Payer: Self-pay | Admitting: Hematology and Oncology

## 2012-08-17 DIAGNOSIS — I70219 Atherosclerosis of native arteries of extremities with intermittent claudication, unspecified extremity: Secondary | ICD-10-CM | POA: Diagnosis not present

## 2012-08-17 DIAGNOSIS — Z09 Encounter for follow-up examination after completed treatment for conditions other than malignant neoplasm: Secondary | ICD-10-CM | POA: Diagnosis not present

## 2012-08-17 DIAGNOSIS — Z8679 Personal history of other diseases of the circulatory system: Secondary | ICD-10-CM | POA: Diagnosis not present

## 2012-08-17 DIAGNOSIS — I714 Abdominal aortic aneurysm, without rupture: Secondary | ICD-10-CM | POA: Diagnosis not present

## 2012-09-18 ENCOUNTER — Ambulatory Visit: Payer: Self-pay | Admitting: Hematology and Oncology

## 2012-10-04 ENCOUNTER — Other Ambulatory Visit: Payer: Self-pay | Admitting: *Deleted

## 2012-10-04 MED ORDER — TRIAMTERENE-HCTZ 37.5-25 MG PO TABS: 1.0000 | ORAL_TABLET | Freq: Every day | ORAL | Status: DC

## 2012-10-04 MED ORDER — ATORVASTATIN CALCIUM 20 MG PO TABS: 20.0000 mg | ORAL_TABLET | Freq: Every day | ORAL | Status: DC

## 2012-11-30 DIAGNOSIS — W57XXXA Bitten or stung by nonvenomous insect and other nonvenomous arthropods, initial encounter: Secondary | ICD-10-CM | POA: Diagnosis not present

## 2012-11-30 DIAGNOSIS — L738 Other specified follicular disorders: Secondary | ICD-10-CM | POA: Diagnosis not present

## 2012-11-30 DIAGNOSIS — Z85828 Personal history of other malignant neoplasm of skin: Secondary | ICD-10-CM | POA: Diagnosis not present

## 2012-11-30 DIAGNOSIS — Z85820 Personal history of malignant melanoma of skin: Secondary | ICD-10-CM | POA: Diagnosis not present

## 2012-11-30 DIAGNOSIS — L570 Actinic keratosis: Secondary | ICD-10-CM | POA: Diagnosis not present

## 2012-11-30 DIAGNOSIS — T148 Other injury of unspecified body region: Secondary | ICD-10-CM | POA: Diagnosis not present

## 2013-01-18 DIAGNOSIS — B079 Viral wart, unspecified: Secondary | ICD-10-CM | POA: Diagnosis not present

## 2013-01-18 DIAGNOSIS — L570 Actinic keratosis: Secondary | ICD-10-CM | POA: Diagnosis not present

## 2013-01-18 DIAGNOSIS — L819 Disorder of pigmentation, unspecified: Secondary | ICD-10-CM | POA: Diagnosis not present

## 2013-01-25 DIAGNOSIS — J9819 Other pulmonary collapse: Secondary | ICD-10-CM | POA: Diagnosis not present

## 2013-01-25 DIAGNOSIS — I739 Peripheral vascular disease, unspecified: Secondary | ICD-10-CM | POA: Diagnosis not present

## 2013-01-25 DIAGNOSIS — I745 Embolism and thrombosis of iliac artery: Secondary | ICD-10-CM | POA: Diagnosis not present

## 2013-01-25 DIAGNOSIS — I2510 Atherosclerotic heart disease of native coronary artery without angina pectoris: Secondary | ICD-10-CM | POA: Diagnosis not present

## 2013-01-25 DIAGNOSIS — I714 Abdominal aortic aneurysm, without rupture: Secondary | ICD-10-CM | POA: Diagnosis not present

## 2013-01-25 DIAGNOSIS — D259 Leiomyoma of uterus, unspecified: Secondary | ICD-10-CM | POA: Diagnosis not present

## 2013-01-25 DIAGNOSIS — K5730 Diverticulosis of large intestine without perforation or abscess without bleeding: Secondary | ICD-10-CM | POA: Diagnosis not present

## 2013-01-25 DIAGNOSIS — I7090 Unspecified atherosclerosis: Secondary | ICD-10-CM | POA: Diagnosis not present

## 2013-01-25 DIAGNOSIS — Z79899 Other long term (current) drug therapy: Secondary | ICD-10-CM | POA: Diagnosis not present

## 2013-01-25 DIAGNOSIS — M479 Spondylosis, unspecified: Secondary | ICD-10-CM | POA: Diagnosis not present

## 2013-02-05 ENCOUNTER — Ambulatory Visit (INDEPENDENT_AMBULATORY_CARE_PROVIDER_SITE_OTHER): Payer: Medicare Other | Admitting: Family Medicine

## 2013-02-05 ENCOUNTER — Ambulatory Visit (INDEPENDENT_AMBULATORY_CARE_PROVIDER_SITE_OTHER)
Admission: RE | Admit: 2013-02-05 | Discharge: 2013-02-05 | Disposition: A | Discharge status: Home / Self Care | Payer: Medicare Other | Source: Ambulatory Visit | Attending: Family Medicine | Admitting: Family Medicine

## 2013-02-05 ENCOUNTER — Encounter: Payer: Self-pay | Admitting: Family Medicine

## 2013-02-05 VITALS — BP 120/54 | HR 74 | Temp 97.3°F | Ht 65.0 in | Wt 170.5 lb

## 2013-02-05 DIAGNOSIS — Z23 Encounter for immunization: Secondary | ICD-10-CM | POA: Diagnosis not present

## 2013-02-05 DIAGNOSIS — M25569 Pain in unspecified knee: Secondary | ICD-10-CM

## 2013-02-05 DIAGNOSIS — M25562 Pain in left knee: Secondary | ICD-10-CM

## 2013-02-05 NOTE — Progress Notes (Signed)
Therapist, music at [facility] [place] [address] Phone: [phone] Fax: [phone]  Date:  02/05/2013   Name:  Tenleigh Orquiz   DOB:  1933/10/30   MRN:  [id number] Gender: female Age: 79 y.o.  Primary Physician:  Viviana Simpler, MD  Evaluating MD: Owens Loffler, MD   Chief Complaint: Knee Pain   History of Present Illness:  Margaurite Stuller is a 79 y.o. pleasant patient who presents with the following:  Did not do anything, and now starting to have pain and swelling on the left knee. Does not ache when sleeping. Only with movement. Some swelling behind the knee. Ignored for about 3-4 weeks. Rotational movements hurt her L knee a lot. There is some swelling. No locking up or giving way. She does have some intermittent crepitus.   Patient Active Problem List   Diagnosis Date Noted  . Routine general medical examination at a health care facility 07/07/2012  . History of melanoma 12/24/2011  . Atherosclerosis of native arteries of the extremities with intermittent claudication 05/03/2011  . Peripheral arterial disease 12/21/2010  . History of squamous cell carcinoma of skin 09/28/2010  . Chronic anemia 04/02/2010  . VENOUS INSUFFICIENCY, CHRONIC 04/02/2010  . HYPERLIPIDEMIA 06/23/2009  . ANXIETY 06/23/2009  . HYPERTENSION 06/23/2009    Past Medical History  Diagnosis Date  . Anxiety   . Hyperlipidemia   . Hypertension   . Anemia   . Chronic venous insufficiency   . PAD (peripheral artery disease) 2012    severe R common iliac stenosis, ABI 0.73, mid L common iliac stenosis ABI 1.1  . AAA (abdominal aortic aneurysm) 2012    3.9cm, rec rpt 1 yr  . Hx of colonoscopy   . PAD (peripheral artery disease)   . Melanoma     Right Arm    Past Surgical History  Procedure Laterality Date  . Tonsillectomy and adenoidectomy  1960  . Knee arthroscopy  1998    right  . Cardiovascular stress test  2008    negative  . Dobutamine stress echo   11/11    negative  . Melanoma excision  11/09/10    wide excision right upper arm  . Abdominal aortic aneurysm repair  02/16/12    [facility]--Dr Sammuel Hines    History   Social History  . Marital Status: Widowed    Spouse Name: N/A    Number of Children: 4  . Years of Education: N/A   Occupational History  . retired- Therapist, sports,     Social History Main Topics  . Smoking status: Former Smoker    Types: Cigarettes    Quit date: 01/12/2012  . Smokeless tobacco: Never Used     Comment: pt states that she has 2-3 cigarettes per day   . Alcohol Use: Yes     Comment: regular wine with dinner  . Drug Use: No  . Sexual Activity: Not on file   Other Topics Concern  . Not on file   Social History Narrative   Widowed 2/11 -2nd for him , 1st for her. 4 step sons. 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   Requests DNR--papers done   Requests no feeding tube          Family History  Problem Relation Age of Onset  . Alzheimer's disease Mother   .  Pancreatic cancer Father   . Coronary artery disease Neg Hx   . Diabetes Neg Hx   . Cancer Neg Hx     breast or colon    No Known Allergies  Medication list has been reviewed and updated.  Outpatient Prescriptions Prior to Visit  Medication Sig Dispense Refill  . aspirin 81 MG tablet Take 81 mg by mouth daily.        Marland Kitchen atorvastatin (LIPITOR) 20 MG tablet Take 1 tablet (20 mg total) by mouth daily.  90 tablet  3  . nitroGLYCERIN (NITROSTAT) 0.4 MG SL tablet Place 0.4 mg under the tongue every 5 (five) minutes as needed.        . triamterene-hydrochlorothiazide (MAXZIDE-25) 37.5-25 MG per tablet Take 1 tablet by mouth daily.  90 tablet  3  . cyanocobalamin (,VITAMIN B-12,) 1000 MCG/ML injection Inject 1,000 mcg into the muscle every 30 (thirty) days.       No facility-administered medications prior to visit.    Review of  Systems:   GEN: No fevers, chills. Nontoxic. Primarily MSK c/o today. MSK: Detailed in the HPI GI: tolerating PO intake without difficulty Neuro: No numbness, parasthesias, or tingling associated. Otherwise the pertinent positives of the ROS are noted above.    Physical Examination: BP 120/54  Pulse 74  Temp(Src) 97.3 F (36.3 C) (Oral)  Ht 5\' 5"  (1.651 m)  Wt 170 lb 8 oz (77.338 kg)  BMI 28.37 kg/m2  Ideal Body Weight: Weight in (lb) to have BMI = 25: 149.9   GEN: WDWN, NAD, Non-toxic, Alert & Oriented x 3 HEENT: Atraumatic, Normocephalic.  Ears and Nose: No external deformity. EXTR: No clubbing/cyanosis/edema  PSYCH: Normally interactive. Conversant. Not depressed or anxious appearing.  Calm demeanor.   Knee:  L Gait: Normal heel toe pattern, antalgic ROM: 0-110 Effusion: mild Echymosis or edema: none Patellar tendon NT Painful PLICA: neg Patellar grind: negative Medial and lateral patellar facet loading: negative medial and lateral joint lines: medial > lateral pain Mcmurray's POS FOR PAIN AND MECHANICAL Flexion-pinch POS Varus and valgus stress: stable Lachman: neg Ant and Post drawer: neg Hip abduction, IR, ER: WNL Hip flexion str: 5/5 Hip abd: 5/5 Quad: 5/5 VMO atrophy: mild Hamstring concentric and eccentric: 5/5   Dg Knee Ap/lat W/sunrise Left  02/05/2013   CLINICAL DATA:  Left knee pain  EXAM: DG KNEE - 3 VIEWS  COMPARISON:  None.  FINDINGS: There is no evidence of fracture, dislocation, or joint effusion. Joint space is preserved. No radiopaque foreign body.  IMPRESSION: Negative.   Electronically Signed   By: Lahoma Crocker   On: 02/05/2013 13:18    Assessment and Plan:  Left knee pain - Plan: DG Knee AP/LAT W/Sunrise Left  Need for prophylactic vaccination and inoculation against influenza - Plan: Flu Vaccine QUAD 36+ mos PF IM (Fluarix)  Most c/w degenerative meniscal tear. We discussed various treatment options, for now she wants to be conservative.  We will start icing her knee 1-2 times and day and take 2-3 otc ibuprofen tid. She can f/u if still symptomatic in a month or so. Rec. She could also try a compression sleeve.   Orders Today:  Orders Placed This Encounter  Procedures  . DG Knee AP/LAT W/Sunrise Left    Standing Status: Future     Number of Occurrences: 1     Standing Expiration Date: 04/07/2014    Order Specific Question:  Reason for exam:    Answer:  knee pain  Order Specific Question:  Preferred imaging location?    Answer:  [facility]  . Flu Vaccine QUAD 36+ mos PF IM (Fluarix)    Updated Medication List: (Includes new medications, updates to list, dose adjustments) No orders of the defined types were placed in this encounter.    Medications Discontinued: There are no discontinued medications.    Signed, Maud Deed. Isaiyah Feldhaus, MD 02/05/2013 12:21 PM

## 2013-02-06 ENCOUNTER — Ambulatory Visit: Payer: Medicare Other | Admitting: Internal Medicine

## 2013-03-14 DIAGNOSIS — L570 Actinic keratosis: Secondary | ICD-10-CM | POA: Diagnosis not present

## 2013-03-14 DIAGNOSIS — L819 Disorder of pigmentation, unspecified: Secondary | ICD-10-CM | POA: Diagnosis not present

## 2013-03-14 DIAGNOSIS — B079 Viral wart, unspecified: Secondary | ICD-10-CM | POA: Diagnosis not present

## 2013-04-20 ENCOUNTER — Encounter: Payer: Self-pay | Admitting: Internal Medicine

## 2013-08-11 ENCOUNTER — Other Ambulatory Visit: Payer: Self-pay | Admitting: Internal Medicine

## 2013-09-05 ENCOUNTER — Other Ambulatory Visit: Payer: Self-pay | Admitting: Internal Medicine

## 2013-09-12 DIAGNOSIS — Z85820 Personal history of malignant melanoma of skin: Secondary | ICD-10-CM | POA: Diagnosis not present

## 2013-09-12 DIAGNOSIS — L570 Actinic keratosis: Secondary | ICD-10-CM | POA: Diagnosis not present

## 2013-09-12 DIAGNOSIS — L819 Disorder of pigmentation, unspecified: Secondary | ICD-10-CM | POA: Diagnosis not present

## 2013-09-12 DIAGNOSIS — Z85828 Personal history of other malignant neoplasm of skin: Secondary | ICD-10-CM | POA: Diagnosis not present

## 2013-10-24 ENCOUNTER — Other Ambulatory Visit: Payer: Self-pay | Admitting: Internal Medicine

## 2013-10-26 ENCOUNTER — Other Ambulatory Visit: Payer: Self-pay | Admitting: *Deleted

## 2013-10-26 MED ORDER — TRIAMTERENE-HCTZ 37.5-25 MG PO CAPS: 1.0000 | ORAL_CAPSULE | Freq: Every day | ORAL | Status: DC

## 2013-11-01 DIAGNOSIS — Z85820 Personal history of malignant melanoma of skin: Secondary | ICD-10-CM | POA: Diagnosis not present

## 2013-11-01 DIAGNOSIS — L723 Sebaceous cyst: Secondary | ICD-10-CM | POA: Diagnosis not present

## 2013-11-01 DIAGNOSIS — L738 Other specified follicular disorders: Secondary | ICD-10-CM | POA: Diagnosis not present

## 2013-11-01 DIAGNOSIS — I789 Disease of capillaries, unspecified: Secondary | ICD-10-CM | POA: Diagnosis not present

## 2013-11-01 DIAGNOSIS — L678 Other hair color and hair shaft abnormalities: Secondary | ICD-10-CM | POA: Diagnosis not present

## 2014-01-15 ENCOUNTER — Ambulatory Visit (INDEPENDENT_AMBULATORY_CARE_PROVIDER_SITE_OTHER): Payer: Medicare Other | Admitting: Internal Medicine

## 2014-01-15 ENCOUNTER — Encounter: Payer: Self-pay | Admitting: Internal Medicine

## 2014-01-15 ENCOUNTER — Ambulatory Visit: Payer: Medicare Other

## 2014-01-15 VITALS — BP 110/60 | HR 70 | Temp 98.5°F | Ht 65.0 in | Wt 171.0 lb

## 2014-01-15 DIAGNOSIS — Z7189 Other specified counseling: Secondary | ICD-10-CM

## 2014-01-15 DIAGNOSIS — Z23 Encounter for immunization: Secondary | ICD-10-CM

## 2014-01-15 DIAGNOSIS — I70219 Atherosclerosis of native arteries of extremities with intermittent claudication, unspecified extremity: Secondary | ICD-10-CM | POA: Diagnosis not present

## 2014-01-15 DIAGNOSIS — I714 Abdominal aortic aneurysm, without rupture, unspecified: Secondary | ICD-10-CM

## 2014-01-15 DIAGNOSIS — Z0000 Encounter for general adult medical examination without abnormal findings: Secondary | ICD-10-CM

## 2014-01-15 DIAGNOSIS — E785 Hyperlipidemia, unspecified: Secondary | ICD-10-CM

## 2014-01-15 DIAGNOSIS — D649 Anemia, unspecified: Secondary | ICD-10-CM

## 2014-01-15 DIAGNOSIS — I10 Essential (primary) hypertension: Secondary | ICD-10-CM

## 2014-01-15 DIAGNOSIS — F39 Unspecified mood [affective] disorder: Secondary | ICD-10-CM

## 2014-01-15 LAB — COMPREHENSIVE METABOLIC PANEL
ALK PHOS: 79 U/L (ref 39–117)
ALT: 22 U/L (ref 0–35)
AST: 26 U/L (ref 0–37)
Albumin: 3.9 g/dL (ref 3.5–5.2)
BILIRUBIN TOTAL: 0.6 mg/dL (ref 0.2–1.2)
BUN: 27 mg/dL — AB (ref 6–23)
CALCIUM: 9.4 mg/dL (ref 8.4–10.5)
CO2: 29 mEq/L (ref 19–32)
Chloride: 103 mEq/L (ref 96–112)
Creatinine, Ser: 1.3 mg/dL — ABNORMAL HIGH (ref 0.4–1.2)
GFR: 40.67 mL/min — ABNORMAL LOW (ref >60.00)
Glucose, Bld: 92 mg/dL (ref 70–99)
Potassium: 4 mEq/L (ref 3.5–5.1)
Sodium: 139 mEq/L (ref 135–145)
Total Protein: 6.8 g/dL (ref 6.0–8.3)

## 2014-01-15 LAB — LDL CHOLESTEROL, DIRECT: Direct LDL: 89.7 mg/dL

## 2014-01-15 LAB — LIPID PANEL
CHOLESTEROL: 151 mg/dL (ref 0–200)
HDL: 38.1 mg/dL — ABNORMAL LOW (ref >39.00)
NonHDL: 112.9
Total CHOL/HDL Ratio: 4
Triglycerides: 265 mg/dL — ABNORMAL HIGH (ref 0.0–149.0)
VLDL: 53 mg/dL — ABNORMAL HIGH (ref 0.0–40.0)

## 2014-01-15 LAB — T4, FREE: Free T4: 0.74 ng/dL (ref 0.60–1.60)

## 2014-01-15 NOTE — Assessment & Plan Note (Signed)
Disappointed with ongoing, though stable, claudication

## 2014-01-15 NOTE — Progress Notes (Signed)
Subjective:    Patient ID: Tiffany Martinez, female    DOB: 12/08/1933, 80 y.o.   MRN: [id number]  HPI Here for Medicare wellness and follow up Reviewed her form and advanced directives Reviewed her other physicians Moderate alcohol--1-2 glasses of wine daily 1 cigarette most days No falls No persistent depression or anhedonia Vision and hearing are okay No cognitive changes Independent with instrumental ADLs  Still has claudication Walking distance is still limited No happy with her surgeon Feels she has back aching due to this also Has 2 non treated aneurysms also that are being followed  No chest pain--or very rare No SOB No dizziness or syncope No edema  On statin No myalgias No GI problems  Has had some hives--hot, itchy and moving rash Wonders if the BP med is related---gets different manufacturers at times Uses ointment and that helps itching (actually ketoconazole)  Doesn't see hematologist Does have some afternoon fatigue--- will sit down and fall asleep Can nap for 30 minutes and feels better  Mood has been better Not anxious of late No depression  Current Outpatient Prescriptions on File Prior to Visit  Medication Sig Dispense Refill  . aspirin 81 MG tablet Take 81 mg by mouth daily.        Marland Kitchen atorvastatin (LIPITOR) 20 MG tablet TAKE 1 TABLET DAILY  90 tablet  2  . nitroGLYCERIN (NITROSTAT) 0.4 MG SL tablet Place 0.4 mg under the tongue every 5 (five) minutes as needed.        . triamterene-hydrochlorothiazide (DYAZIDE) 37.5-25 MG per capsule Take 1 each (1 capsule total) by mouth daily.  90 capsule  3   No current facility-administered medications on file prior to visit.    No Known Allergies  Past Medical History  Diagnosis Date  . Anxiety   . Hyperlipidemia   . Hypertension   . Anemia   . Chronic venous insufficiency   . PAD (peripheral artery disease) 2012    severe R common iliac stenosis, ABI 0.73, mid L common iliac stenosis ABI 1.1    . AAA (abdominal aortic aneurysm) 2012    3.9cm, rec rpt 1 yr  . Hx of colonoscopy   . PAD (peripheral artery disease)   . Melanoma     Right Arm  . CLL (chronic lymphocytic leukemia) 2014    Stage 0--by flow cytometry    Past Surgical History  Procedure Laterality Date  . Tonsillectomy and adenoidectomy  1960  . Knee arthroscopy  1998    right  . Cardiovascular stress test  2008    negative  . Dobutamine stress echo  11/11    negative  . Melanoma excision  11/09/10    wide excision right upper arm  . Abdominal aortic aneurysm repair  02/16/12    [facility]--Dr Sammuel Hines    Family History  Problem Relation Age of Onset  . Alzheimer's disease Mother   . Pancreatic cancer Father   . Coronary artery disease Neg Hx   . Diabetes Neg Hx   . Cancer Neg Hx     breast or colon    History   Social History  . Marital Status: Widowed    Spouse Name: N/A    Number of Children: 4  . Years of Education: N/A   Occupational History  . retired- Therapist, sports,     Social History Main Topics  . Smoking status: Former Smoker    Types: Cigarettes    Quit date: 01/12/2012  . Smokeless tobacco: Never  Used     Comment: pt states that she has 2-3 cigarettes per day   . Alcohol Use: Yes     Comment: regular wine with dinner  . Drug Use: No  . Sexual Activity: Not on file   Other Topics Concern  . Not on file   Social History Narrative   Widowed 2/11 -2nd for him , 1st for her. 4 step sons. 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   Requests DNR--papers done   Requests no feeding tube            Review of Systems Appetite is good--hard to cook for one person Weight is stable Sleeps okay No arthritis problems Voids well Constipated at times    Objective:   Physical Exam  Constitutional: She is oriented to person, place, and time. She appears well-developed and well-nourished. No  distress.  HENT:  Mouth/Throat: Oropharynx is clear and moist. No oropharyngeal exudate.  Neck: Normal range of motion. Neck supple. No thyromegaly present.  Cardiovascular: Normal rate, regular rhythm, normal heart sounds and intact distal pulses.  Exam reveals no gallop.   No murmur heard. Pulmonary/Chest: Effort normal and breath sounds normal. No respiratory distress. She has no wheezes. She has no rales.  Abdominal: Soft. There is no tenderness.  Musculoskeletal: She exhibits no edema and no tenderness.  Lymphadenopathy:    She has no cervical adenopathy.  Neurological: She is alert and oriented to person, place, and time.  President-- "Obama, Bush, Clinton" 100-93-8.Marland Kitchen... D-l-r-o-w Recall 2/3  Skin: No rash noted. No erythema.  Psychiatric: She has a normal mood and affect. Her behavior is normal.          Assessment & Plan:

## 2014-01-15 NOTE — Assessment & Plan Note (Signed)
BP Readings from Last 3 Encounters:  01/15/14 110/60  02/05/13 120/54  07/07/12 138/62   Good control No change needed

## 2014-01-15 NOTE — Progress Notes (Signed)
Pre visit review using our clinic review tool, if applicable. No additional management support is needed unless otherwise documented below in the visit note. 

## 2014-01-15 NOTE — Assessment & Plan Note (Signed)
Needs to continue secondary prevention

## 2014-01-15 NOTE — Assessment & Plan Note (Signed)
Had stent and gets regular follow up

## 2014-01-15 NOTE — Assessment & Plan Note (Signed)
Anxiety and adjustment issues in the past Doing well now

## 2014-01-15 NOTE — Addendum Note (Signed)
Addended by: Despina Hidden on: 01/15/2014 11:43 AM   Modules accepted: Orders

## 2014-01-15 NOTE — Assessment & Plan Note (Signed)
See social history 

## 2014-01-15 NOTE — Assessment & Plan Note (Signed)
I have personally reviewed the Medicare Annual Wellness questionnaire and have noted 1. The patient's medical and social history 2. Their use of alcohol, tobacco or illicit drugs 3. Their current medications and supplements 4. The patient's functional ability including ADL's, fall risks, home safety risks and hearing or visual             impairment. 5. Diet and physical activities 6. Evidence for depression or mood disorders  The patients weight, height, BMI and visual acuity have been recorded in the chart I have made referrals, counseling and provided education to the patient based review of the above and I have provided the pt with a written personalized care plan for preventive services.  I have provided you with a copy of your personalized plan for preventive services. Please take the time to review along with your updated medication list.  Td, flu, prevnar today Prefers no cancer screening anymore Discussed the cigarette daily--not willing to stop (and probably not a big deal)

## 2014-01-15 NOTE — Assessment & Plan Note (Signed)
Due for recheck

## 2014-01-16 ENCOUNTER — Encounter: Payer: Self-pay | Admitting: *Deleted

## 2014-01-16 DIAGNOSIS — Z85820 Personal history of malignant melanoma of skin: Secondary | ICD-10-CM | POA: Diagnosis not present

## 2014-01-16 DIAGNOSIS — D1801 Hemangioma of skin and subcutaneous tissue: Secondary | ICD-10-CM | POA: Diagnosis not present

## 2014-01-16 DIAGNOSIS — D485 Neoplasm of uncertain behavior of skin: Secondary | ICD-10-CM | POA: Diagnosis not present

## 2014-01-16 DIAGNOSIS — D0430 Carcinoma in situ of skin of unspecified part of face: Secondary | ICD-10-CM | POA: Diagnosis not present

## 2014-01-16 DIAGNOSIS — D0439 Carcinoma in situ of skin of other parts of face: Secondary | ICD-10-CM | POA: Diagnosis not present

## 2014-01-16 DIAGNOSIS — L723 Sebaceous cyst: Secondary | ICD-10-CM | POA: Diagnosis not present

## 2014-01-16 DIAGNOSIS — L821 Other seborrheic keratosis: Secondary | ICD-10-CM | POA: Diagnosis not present

## 2014-01-16 LAB — CBC WITH DIFFERENTIAL/PLATELET
BASOS ABS: 0 10*3/uL (ref 0.0–0.1)
BASOS PCT: 0 % (ref 0–1)
EOS ABS: 0.1 10*3/uL (ref 0.0–0.7)
Eosinophils Relative: 1 % (ref 0–5)
HCT: 33.2 % — ABNORMAL LOW (ref 36.0–46.0)
HEMOGLOBIN: 11.2 g/dL — AB (ref 12.0–15.0)
Lymphocytes Relative: 72 % — ABNORMAL HIGH (ref 12–46)
Lymphs Abs: 10 10*3/uL — ABNORMAL HIGH (ref 0.7–4.0)
MCH: 33.5 pg (ref 26.0–34.0)
MCHC: 33.7 g/dL (ref 30.0–36.0)
MCV: 99.4 fL (ref 78.0–100.0)
MONO ABS: 0.6 10*3/uL (ref 0.1–1.0)
MONOS PCT: 4 % (ref 3–12)
NEUTROS PCT: 23 % — AB (ref 43–77)
Neutro Abs: 3.2 10*3/uL (ref 1.7–7.7)
Platelets: 167 10*3/uL (ref 150–400)
RBC: 3.34 MIL/uL — ABNORMAL LOW (ref 3.87–5.11)
RDW: 15.8 % — ABNORMAL HIGH (ref 11.5–15.5)
WBC: 13.9 10*3/uL — ABNORMAL HIGH (ref 4.0–10.5)

## 2014-01-16 LAB — PATHOLOGIST SMEAR REVIEW

## 2014-02-14 DIAGNOSIS — Z7982 Long term (current) use of aspirin: Secondary | ICD-10-CM | POA: Diagnosis not present

## 2014-02-14 DIAGNOSIS — J9811 Atelectasis: Secondary | ICD-10-CM | POA: Diagnosis not present

## 2014-02-14 DIAGNOSIS — I723 Aneurysm of iliac artery: Secondary | ICD-10-CM | POA: Diagnosis not present

## 2014-02-14 DIAGNOSIS — I10 Essential (primary) hypertension: Secondary | ICD-10-CM | POA: Diagnosis not present

## 2014-02-14 DIAGNOSIS — I70213 Atherosclerosis of native arteries of extremities with intermittent claudication, bilateral legs: Secondary | ICD-10-CM | POA: Diagnosis not present

## 2014-02-14 DIAGNOSIS — Z87891 Personal history of nicotine dependence: Secondary | ICD-10-CM | POA: Diagnosis not present

## 2014-02-14 DIAGNOSIS — I701 Atherosclerosis of renal artery: Secondary | ICD-10-CM | POA: Diagnosis not present

## 2014-02-14 DIAGNOSIS — Z79899 Other long term (current) drug therapy: Secondary | ICD-10-CM | POA: Diagnosis not present

## 2014-02-14 DIAGNOSIS — I714 Abdominal aortic aneurysm, without rupture: Secondary | ICD-10-CM | POA: Diagnosis not present

## 2014-02-14 DIAGNOSIS — I716 Thoracoabdominal aortic aneurysm, without rupture: Secondary | ICD-10-CM | POA: Diagnosis not present

## 2014-02-14 DIAGNOSIS — D259 Leiomyoma of uterus, unspecified: Secondary | ICD-10-CM | POA: Diagnosis not present

## 2014-02-14 DIAGNOSIS — I739 Peripheral vascular disease, unspecified: Secondary | ICD-10-CM | POA: Diagnosis not present

## 2014-02-14 DIAGNOSIS — I998 Other disorder of circulatory system: Secondary | ICD-10-CM | POA: Diagnosis not present

## 2014-03-06 DIAGNOSIS — L509 Urticaria, unspecified: Secondary | ICD-10-CM | POA: Diagnosis not present

## 2014-03-06 DIAGNOSIS — D099 Carcinoma in situ, unspecified: Secondary | ICD-10-CM | POA: Diagnosis not present

## 2014-03-06 DIAGNOSIS — L578 Other skin changes due to chronic exposure to nonionizing radiation: Secondary | ICD-10-CM | POA: Diagnosis not present

## 2014-03-06 DIAGNOSIS — L908 Other atrophic disorders of skin: Secondary | ICD-10-CM | POA: Diagnosis not present

## 2014-03-18 ENCOUNTER — Telehealth: Payer: Self-pay

## 2014-03-18 NOTE — Telephone Encounter (Signed)
Pt left vm requesting cb about surgeon advising pt to take a blood thinner; surgeon put stent in for aneurysm and advised pt needs to take Cilostazol; pt read possible side effects and is concerned about starting this medication. Pt request advice from Dr Silvio Pate about taking this medication.

## 2014-03-18 NOTE — Telephone Encounter (Signed)
Let her know that I would recommend that she take it--it is usually well tolerated

## 2014-03-18 NOTE — Telephone Encounter (Signed)
Spoke with patient and advised results   

## 2014-07-11 DIAGNOSIS — D2262 Melanocytic nevi of left upper limb, including shoulder: Secondary | ICD-10-CM | POA: Diagnosis not present

## 2014-07-11 DIAGNOSIS — Z85820 Personal history of malignant melanoma of skin: Secondary | ICD-10-CM | POA: Diagnosis not present

## 2014-07-11 DIAGNOSIS — D225 Melanocytic nevi of trunk: Secondary | ICD-10-CM | POA: Diagnosis not present

## 2014-07-11 DIAGNOSIS — Z85828 Personal history of other malignant neoplasm of skin: Secondary | ICD-10-CM | POA: Diagnosis not present

## 2014-08-21 ENCOUNTER — Emergency Department
Admit: 2014-08-21 | Disposition: A | Discharge status: Home / Self Care | Payer: Self-pay | Admitting: Emergency Medicine

## 2014-08-21 ENCOUNTER — Telehealth: Payer: Self-pay | Admitting: Internal Medicine

## 2014-08-21 DIAGNOSIS — I10 Essential (primary) hypertension: Secondary | ICD-10-CM | POA: Diagnosis not present

## 2014-08-21 DIAGNOSIS — Z9889 Other specified postprocedural states: Secondary | ICD-10-CM | POA: Diagnosis not present

## 2014-08-21 DIAGNOSIS — R109 Unspecified abdominal pain: Secondary | ICD-10-CM | POA: Diagnosis not present

## 2014-08-21 DIAGNOSIS — Z720 Tobacco use: Secondary | ICD-10-CM | POA: Diagnosis not present

## 2014-08-21 DIAGNOSIS — D259 Leiomyoma of uterus, unspecified: Secondary | ICD-10-CM | POA: Diagnosis not present

## 2014-08-21 LAB — COMPREHENSIVE METABOLIC PANEL
ANION GAP: 5 — AB (ref 7–16)
Albumin: 4.1 g/dL
Alkaline Phosphatase: 82 U/L
BILIRUBIN TOTAL: 0.6 mg/dL
BUN: 23 mg/dL — ABNORMAL HIGH
CALCIUM: 9.5 mg/dL
CHLORIDE: 104 mmol/L
CO2: 26 mmol/L
Creatinine: 1.15 mg/dL — ABNORMAL HIGH
EGFR (African American): 53 — ABNORMAL LOW
EGFR (Non-African Amer.): 46 — ABNORMAL LOW
GLUCOSE: 121 mg/dL — AB
Potassium: 3.7 mmol/L
SGOT(AST): 23 U/L
SGPT (ALT): 16 U/L
Sodium: 135 mmol/L
TOTAL PROTEIN: 6.5 g/dL

## 2014-08-21 LAB — CBC WITH DIFFERENTIAL/PLATELET
Comment - H1-Com3: NORMAL
HCT: 34.1 % — AB (ref 35.0–47.0)
HGB: 11.6 g/dL — AB (ref 12.0–16.0)
Lymphocytes: 66 %
MCH: 36.5 pg — AB (ref 26.0–34.0)
MCHC: 34 g/dL (ref 32.0–36.0)
MCV: 107 fL — ABNORMAL HIGH (ref 80–100)
Monocytes: 4 %
Platelet: 183 10*3/uL (ref 150–440)
RBC: 3.17 10*6/uL — ABNORMAL LOW (ref 3.80–5.20)
RDW: 15.3 % — AB (ref 11.5–14.5)
Segmented Neutrophils: 23 %
Variant Lymphocyte - H1-Rlymph: 7 %
WBC: 16.9 10*3/uL — ABNORMAL HIGH (ref 3.6–11.0)

## 2014-08-21 LAB — URINALYSIS, COMPLETE
Bacteria: NONE SEEN
Bilirubin,UR: NEGATIVE
Blood: NEGATIVE
Glucose,UR: NEGATIVE mg/dL (ref 0–75)
Hyaline Cast: 2
KETONE: NEGATIVE
Leukocyte Esterase: NEGATIVE
Nitrite: NEGATIVE
PROTEIN: NEGATIVE
Ph: 6 (ref 4.5–8.0)
Specific Gravity: 1.017 (ref 1.003–1.030)
Squamous Epithelial: NONE SEEN

## 2014-08-21 NOTE — Telephone Encounter (Signed)
PLEASE NOTE: All timestamps contained within this report are represented as [place] Standard Time. CONFIDENTIALTY NOTICE: This fax transmission is intended only for the addressee. It contains information that is legally privileged, confidential or otherwise protected from use or disclosure. If you are not the intended recipient, you are strictly prohibited from reviewing, disclosing, copying using or disseminating any of this information or taking any action in reliance on or regarding this information. If you have received this fax in error, please notify us immediately by telephone so that we can arrange for its return to [place]. Phone: [phone], Toll-Free: [phone], Fax: [phone] Page: 1 of 2 Call Id: [id number] Taylor Patient Name: Tiffany Martinez Gender: Female DOB: Jun 14, 1933 Age: 81 Y 22 M 19 D Return Phone Number: [phone] (Primary) Address: City/State/Zip: [place] Client [facility] - Client Client Site [facility] - Day Physician Viviana Simpler Contact Type Call Call Type Triage / Clinical Relationship To Patient Self Appointment Disposition EMR Appointment Not Necessary Info pasted into Epic Yes Return Phone Number [phone] (Primary) Chief Complaint Abdominal Pain Initial Comment caller states she has abdominal pain PreDisposition Call Doctor Nurse Assessment Nurse: Donalynn Furlong, RN, Myna Hidalgo Date/Time Eilene Ghazi Time): 08/21/2014 9:42:59 AM Confirm and document reason for call. If symptomatic, describe symptoms. ---caller states she has abdominal pain, has been for the last 3 weeks. "feels like its in the lower colon"." Pain comes and goes, left to right". BM's are dark. Denies constipation. Pain is worse right after a "bowel movement". Denies vomiting, nausea. Afebrile. "Feels weak" Has the patient traveled out of the  country within the last 30 days? ---No Does the patient require triage? ---Yes Related visit to physician within the last 2 weeks? ---No Does the PT have any chronic conditions? (i.e. diabetes, asthma, etc.) ---Yes List chronic conditions. ---high blood pressure, high cholesterol Guidelines Guideline Title Affirmed Question Affirmed Notes Nurse Date/Time Eilene Ghazi Time) Abdominal Pain - Female Black or tarry bowel movements (Exception: chronic-unchanged blackgrey bowel movements AND is taking iron pills or Pepto-bismol) Donalynn Furlong, RN, Myna Hidalgo 08/21/2014 9:49:21 AM Disp. Time Eilene Ghazi Time) Disposition Final User 08/21/2014 9:40:01 AM Send To Clinical Follow Up Jeral Fruit 08/21/2014 9:55:39 AM Go to ED Now Yes Donalynn Furlong, RN, Myna Hidalgo PLEASE NOTE: All timestamps contained within this report are represented as [place] Standard Time. CONFIDENTIALTY NOTICE: This fax transmission is intended only for the addressee. It contains information that is legally privileged, confidential or otherwise protected from use or disclosure. If you are not the intended recipient, you are strictly prohibited from reviewing, disclosing, copying using or disseminating any of this information or taking any action in reliance on or regarding this information. If you have received this fax in error, please notify us immediately by telephone so that we can arrange for its return to [place]. Phone: [phone], Toll-Free: [phone], Fax: [phone] Page: 2 of 2 Call Id: [id number] Caller Understands: Yes Disagree/Comply: Comply Care Advice Given Per Guideline GO TO ED NOW: You need to be seen in the Emergency Department. Go to the ER at ___________ [place] now. Drive carefully. DRIVING: Another adult should drive. Do not delay going to the Emergency Department. If immediate transportation is not available via car or taxi, then the patient should be instructed to call [id number]. BRING MEDICINES: * Please  bring a list of your current medicines when you go to the Emergency Department (ER). * It  is also a good idea to bring the pill bottles too. This will help the doctor to make certain you are taking the right medicines and the right dose. CARE ADVICE given per Abdominal Pain, Female (Adult) guideline. After Care Instructions Given Call Event Type User Date / Time Description Referrals [facility] - ED

## 2014-08-21 NOTE — Telephone Encounter (Signed)
[facility] Patient Name: Tiffany Martinez DOB: 03-Aug-1933 Initial Comment caller states she has abdominal pain Nurse Assessment Nurse: Donalynn Furlong, RN, Myna Hidalgo Date/Time Eilene Ghazi Time): 08/21/2014 9:42:59 AM Confirm and document reason for call. If symptomatic, describe symptoms. ---caller states she has abdominal pain, has been for the last 3 weeks. "feels like its in the lower colon"." Pain comes and goes, left to right". BM's are dark. Denies constipation. Pain is worse right after a "bowel movement". Denies vomiting, nausea. Afebrile. "Feels weak" Has the patient traveled out of the country within the last 30 days? ---No Does the patient require triage? ---Yes Related visit to physician within the last 2 weeks? ---No Does the PT have any chronic conditions? (i.e. diabetes, asthma, etc.) ---Yes List chronic conditions. ---high blood pressure, high cholesterol Guidelines Guideline Title Affirmed Question Affirmed Notes Abdominal Pain - Female Black or tarry bowel movements (Exception: chronic-unchanged black-grey bowel movements AND is taking iron pills or Pepto-bismol) Final Disposition User Go to ED Now Donalynn Furlong, RN, Myna Hidalgo

## 2014-08-21 NOTE — Telephone Encounter (Signed)
Dee,  Please check on her tomorrow   Vincente Liberty, This is a good example of triage that may be overaggressive sending to ER during working hours

## 2014-08-22 NOTE — Telephone Encounter (Signed)
Have her set up an appt with me if she has any ongoing pain I am not sure about the colonoscopy at her age

## 2014-08-22 NOTE — Telephone Encounter (Signed)
Pt went to ED for abdominal pain and they didn't see anything with the CT scan. Pt states she's feeling much better. Pt states the ED suggested she have a colonoscopy. Please advise

## 2014-08-23 NOTE — Telephone Encounter (Signed)
Spoke with patient and advised results, pt appt made

## 2014-08-27 ENCOUNTER — Ambulatory Visit (INDEPENDENT_AMBULATORY_CARE_PROVIDER_SITE_OTHER): Payer: Medicare Other | Admitting: Internal Medicine

## 2014-08-27 ENCOUNTER — Encounter: Payer: Self-pay | Admitting: Internal Medicine

## 2014-08-27 VITALS — BP 118/60 | HR 71 | Temp 97.4°F | Wt 169.0 lb

## 2014-08-27 DIAGNOSIS — R1032 Left lower quadrant pain: Secondary | ICD-10-CM

## 2014-08-27 NOTE — Progress Notes (Signed)
Pre visit review using our clinic review tool, if applicable. No additional management support is needed unless otherwise documented below in the visit note. 

## 2014-08-27 NOTE — Progress Notes (Signed)
Subjective:    Patient ID: Tiffany Martinez, female    DOB: 1933-10-13, 81 y.o.   MRN: [id number]  HPI Here due to recurrent abdominal pain Across "the transverse colon" No constipation--but seems to occur after moving bowels Worsened last week and was persistent Had tender spot in LLQ then So bad she nearly passed out Some dark stools--not true melena though  Advised to go to ER Seen at [facility] 3 days ago CT of abdomen done-- advised this was normal Pain has resolved since then  Pain when it was there---was sharp Fairly steady pain after BM Usually would resolve on its own--went to ER due to its persistent  Current Outpatient Prescriptions on File Prior to Visit  Medication Sig Dispense Refill  . aspirin 81 MG tablet Take 81 mg by mouth daily.      Marland Kitchen atorvastatin (LIPITOR) 20 MG tablet TAKE 1 TABLET DAILY 90 tablet 2  . nitroGLYCERIN (NITROSTAT) 0.4 MG SL tablet Place 0.4 mg under the tongue every 5 (five) minutes as needed.      . triamterene-hydrochlorothiazide (DYAZIDE) 37.5-25 MG per capsule Take 1 each (1 capsule total) by mouth daily. 90 capsule 3   No current facility-administered medications on file prior to visit.    No Known Allergies  Past Medical History  Diagnosis Date  . Anxiety   . Hyperlipidemia   . Hypertension   . Anemia   . Chronic venous insufficiency   . PAD (peripheral artery disease) 2012    severe R common iliac stenosis, ABI 0.73, mid L common iliac stenosis ABI 1.1  . AAA (abdominal aortic aneurysm) 2012    3.9cm, rec rpt 1 yr  . Hx of colonoscopy   . PAD (peripheral artery disease)   . Melanoma     Right Arm  . CLL (chronic lymphocytic leukemia) 2014    Stage 0--by flow cytometry    Past Surgical History  Procedure Laterality Date  . Tonsillectomy and adenoidectomy  1960  . Knee arthroscopy  1998    right  . Cardiovascular stress test  2008    negative  . Dobutamine stress echo  11/11    negative  . Melanoma excision  11/09/10      wide excision right upper arm  . Abdominal aortic aneurysm repair  02/16/12    [facility]--Dr Sammuel Hines    Family History  Problem Relation Age of Onset  . Alzheimer's disease Mother   . Pancreatic cancer Father   . Coronary artery disease Neg Hx   . Diabetes Neg Hx   . Cancer Neg Hx     breast or colon    History   Social History  . Marital Status: Widowed    Spouse Name: N/A  . Number of Children: 4  . Years of Education: N/A   Occupational History  . retired- Therapist, sports,     Social History Main Topics  . Smoking status: Former Smoker    Types: Cigarettes    Quit date: 01/12/2012  . Smokeless tobacco: Never Used     Comment: pt states that she has 2-3 cigarettes per day   . Alcohol Use: Yes     Comment: regular wine with dinner  . Drug Use: No  . Sexual Activity: Not on file   Other Topics Concern  . Not on file   Social History Narrative   Widowed 2/11 -2nd for him , 1st for her. 4 step sons. 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   Requests DNR--papers done   Requests no feeding tube            Review of Systems No dysuria or hematuria No fever Appetite was off when pain was worse---trying to watch what she eats    Objective:   Physical Exam  Constitutional: She appears well-developed and well-nourished. No distress.  Pulmonary/Chest: Effort normal and breath sounds normal. No respiratory distress. She has no wheezes. She has no rales.  Abdominal: Soft. Bowel sounds are normal. She exhibits no distension and no mass. There is no tenderness. There is no rebound and no guarding.          Assessment & Plan:

## 2014-08-27 NOTE — Assessment & Plan Note (Signed)
Had transverse pain that localized to LLQ Seen in ER and CT "negative" Clinical picture could have been low grade diverticulitis but not sure if seen on CT Will review ER records Totally asymptomatic now--will hold off on any further testing

## 2014-09-11 ENCOUNTER — Encounter: Payer: Self-pay | Admitting: Internal Medicine

## 2014-10-16 ENCOUNTER — Other Ambulatory Visit: Payer: Self-pay | Admitting: Internal Medicine

## 2015-01-14 ENCOUNTER — Other Ambulatory Visit: Payer: Self-pay | Admitting: Internal Medicine

## 2015-01-31 DIAGNOSIS — Z08 Encounter for follow-up examination after completed treatment for malignant neoplasm: Secondary | ICD-10-CM | POA: Diagnosis not present

## 2015-01-31 DIAGNOSIS — D0439 Carcinoma in situ of skin of other parts of face: Secondary | ICD-10-CM | POA: Diagnosis not present

## 2015-01-31 DIAGNOSIS — L538 Other specified erythematous conditions: Secondary | ICD-10-CM | POA: Diagnosis not present

## 2015-01-31 DIAGNOSIS — Z85820 Personal history of malignant melanoma of skin: Secondary | ICD-10-CM | POA: Diagnosis not present

## 2015-01-31 DIAGNOSIS — L820 Inflamed seborrheic keratosis: Secondary | ICD-10-CM | POA: Diagnosis not present

## 2015-01-31 DIAGNOSIS — Z85828 Personal history of other malignant neoplasm of skin: Secondary | ICD-10-CM | POA: Diagnosis not present

## 2015-01-31 DIAGNOSIS — L298 Other pruritus: Secondary | ICD-10-CM | POA: Diagnosis not present

## 2015-02-20 DIAGNOSIS — I739 Peripheral vascular disease, unspecified: Secondary | ICD-10-CM | POA: Diagnosis not present

## 2015-02-20 DIAGNOSIS — E785 Hyperlipidemia, unspecified: Secondary | ICD-10-CM | POA: Diagnosis not present

## 2015-02-20 DIAGNOSIS — Z79899 Other long term (current) drug therapy: Secondary | ICD-10-CM | POA: Diagnosis not present

## 2015-02-20 DIAGNOSIS — Z87891 Personal history of nicotine dependence: Secondary | ICD-10-CM | POA: Diagnosis not present

## 2015-02-20 DIAGNOSIS — I714 Abdominal aortic aneurysm, without rupture: Secondary | ICD-10-CM | POA: Diagnosis not present

## 2015-02-20 DIAGNOSIS — Z48812 Encounter for surgical aftercare following surgery on the circulatory system: Secondary | ICD-10-CM | POA: Diagnosis not present

## 2015-02-20 DIAGNOSIS — I10 Essential (primary) hypertension: Secondary | ICD-10-CM | POA: Diagnosis not present

## 2015-03-06 DIAGNOSIS — Z9889 Other specified postprocedural states: Secondary | ICD-10-CM | POA: Diagnosis not present

## 2015-03-06 DIAGNOSIS — I774 Celiac artery compression syndrome: Secondary | ICD-10-CM | POA: Diagnosis not present

## 2015-03-06 DIAGNOSIS — I714 Abdominal aortic aneurysm, without rupture: Secondary | ICD-10-CM | POA: Diagnosis not present

## 2015-03-06 DIAGNOSIS — G129 Spinal muscular atrophy, unspecified: Secondary | ICD-10-CM | POA: Diagnosis not present

## 2015-05-15 ENCOUNTER — Encounter: Payer: Self-pay | Admitting: *Deleted

## 2015-06-17 DIAGNOSIS — H2513 Age-related nuclear cataract, bilateral: Secondary | ICD-10-CM | POA: Diagnosis not present

## 2015-07-30 ENCOUNTER — Telehealth: Payer: Self-pay | Admitting: Internal Medicine

## 2015-07-30 ENCOUNTER — Encounter: Payer: Self-pay | Admitting: Emergency Medicine

## 2015-07-30 ENCOUNTER — Emergency Department: Payer: Medicare Other

## 2015-07-30 ENCOUNTER — Emergency Department
Admission: EM | Admit: 2015-07-30 | Discharge: 2015-07-30 | Disposition: A | Discharge status: Home / Self Care | Payer: Medicare Other | Attending: Emergency Medicine | Admitting: Emergency Medicine

## 2015-07-30 DIAGNOSIS — I714 Abdominal aortic aneurysm, without rupture: Secondary | ICD-10-CM | POA: Diagnosis not present

## 2015-07-30 DIAGNOSIS — C4492 Squamous cell carcinoma of skin, unspecified: Secondary | ICD-10-CM | POA: Insufficient documentation

## 2015-07-30 DIAGNOSIS — Z7982 Long term (current) use of aspirin: Secondary | ICD-10-CM | POA: Diagnosis not present

## 2015-07-30 DIAGNOSIS — R1032 Left lower quadrant pain: Secondary | ICD-10-CM | POA: Insufficient documentation

## 2015-07-30 DIAGNOSIS — Z79899 Other long term (current) drug therapy: Secondary | ICD-10-CM | POA: Insufficient documentation

## 2015-07-30 DIAGNOSIS — Z87891 Personal history of nicotine dependence: Secondary | ICD-10-CM | POA: Diagnosis not present

## 2015-07-30 DIAGNOSIS — I10 Essential (primary) hypertension: Secondary | ICD-10-CM | POA: Insufficient documentation

## 2015-07-30 DIAGNOSIS — I739 Peripheral vascular disease, unspecified: Secondary | ICD-10-CM | POA: Diagnosis not present

## 2015-07-30 DIAGNOSIS — F419 Anxiety disorder, unspecified: Secondary | ICD-10-CM | POA: Insufficient documentation

## 2015-07-30 DIAGNOSIS — Z856 Personal history of leukemia: Secondary | ICD-10-CM | POA: Insufficient documentation

## 2015-07-30 DIAGNOSIS — E785 Hyperlipidemia, unspecified: Secondary | ICD-10-CM | POA: Diagnosis not present

## 2015-07-30 LAB — CBC WITH DIFFERENTIAL/PLATELET
BAND NEUTROPHILS: 2 %
Basophils Absolute: 0 10*3/uL (ref 0–0.1)
Basophils Relative: 0 %
Blasts: 0 %
EOS ABS: 0.1 10*3/uL (ref 0–0.7)
Eosinophils Relative: 1 %
HCT: 32.2 % — ABNORMAL LOW (ref 35.0–47.0)
HEMOGLOBIN: 11.4 g/dL — AB (ref 12.0–16.0)
Lymphocytes Relative: 61 %
Lymphs Abs: 9.1 10*3/uL — ABNORMAL HIGH (ref 1.0–3.6)
MCH: 38.3 pg — ABNORMAL HIGH (ref 26.0–34.0)
MCHC: 35.4 g/dL (ref 32.0–36.0)
MCV: 108.1 fL — ABNORMAL HIGH (ref 80.0–100.0)
MONO ABS: 0.4 10*3/uL (ref 0.2–0.9)
MYELOCYTES: 0 %
Metamyelocytes Relative: 0 %
Monocytes Relative: 3 %
Neutro Abs: 5.2 10*3/uL (ref 1.4–6.5)
Neutrophils Relative %: 33 %
Other: 0 %
PROMYELOCYTES ABS: 0 %
Platelets: 157 10*3/uL (ref 150–440)
RBC: 2.98 MIL/uL — ABNORMAL LOW (ref 3.80–5.20)
RDW: 15.9 % — ABNORMAL HIGH (ref 11.5–14.5)
WBC: 14.8 10*3/uL — ABNORMAL HIGH (ref 3.6–11.0)
nRBC: 0 /100 WBC

## 2015-07-30 LAB — URINALYSIS COMPLETE WITH MICROSCOPIC (ARMC ONLY)
Bacteria, UA: NONE SEEN
Bilirubin Urine: NEGATIVE
Glucose, UA: NEGATIVE mg/dL
HGB URINE DIPSTICK: NEGATIVE
Ketones, ur: NEGATIVE mg/dL
Leukocytes, UA: NEGATIVE
NITRITE: NEGATIVE
Protein, ur: NEGATIVE mg/dL
Specific Gravity, Urine: 1.039 — ABNORMAL HIGH (ref 1.005–1.030)
pH: 5 (ref 5.0–8.0)

## 2015-07-30 LAB — COMPREHENSIVE METABOLIC PANEL
ALBUMIN: 4.1 g/dL (ref 3.5–5.0)
ALK PHOS: 78 U/L (ref 38–126)
ALT: 17 U/L (ref 14–54)
ANION GAP: 6 (ref 5–15)
AST: 21 U/L (ref 15–41)
BUN: 23 mg/dL — ABNORMAL HIGH (ref 6–20)
CALCIUM: 9.4 mg/dL (ref 8.9–10.3)
CO2: 25 mmol/L (ref 22–32)
CREATININE: 1.14 mg/dL — AB (ref 0.44–1.00)
Chloride: 107 mmol/L (ref 101–111)
GFR calc Af Amer: 52 mL/min — ABNORMAL LOW (ref >60)
GFR calc non Af Amer: 45 mL/min — ABNORMAL LOW (ref >60)
GLUCOSE: 112 mg/dL — AB (ref 65–99)
Potassium: 4.1 mmol/L (ref 3.5–5.1)
SODIUM: 138 mmol/L (ref 135–145)
Total Bilirubin: 0.5 mg/dL (ref 0.3–1.2)
Total Protein: 6.9 g/dL (ref 6.5–8.1)

## 2015-07-30 LAB — LIPASE, BLOOD: Lipase: 25 U/L (ref 11–51)

## 2015-07-30 MED ORDER — IOHEXOL 300 MG/ML  SOLN
75.0000 mL | Freq: Once | INTRAMUSCULAR | Status: AC | PRN
  Administered 2015-07-30: 75 mL via INTRAVENOUS
  Filled 2015-07-30: qty 75

## 2015-07-30 MED ORDER — IOHEXOL 240 MG/ML SOLN
25.0000 mL | Freq: Once | INTRAMUSCULAR | Status: AC | PRN
  Administered 2015-07-30: 25 mL via ORAL
  Filled 2015-07-30: qty 25

## 2015-07-30 NOTE — Telephone Encounter (Signed)
[facility]  Patient Name: Tiffany Martinez  DOB: Sep 28, 1933    Initial Comment Caller states having severe lower abd pain; wants appt; does not want to go to ED;    Nurse Assessment  Nurse: Wayne Sever, RN, Tillie Rung Date/Time (Eastern Time): 07/30/2015 10:48:43 AM  Confirm and document reason for call. If symptomatic, describe symptoms. You must click the next button to save text entered. ---Caller states she is having lower abdominal pain. She states she had it 2 months ago and did not go to ER and then it went away. It has come back on and off since then. She states it will not go away, and has spent the last 2 days in bed. She has not taken temperature, but feels like she has one, she states it comes and goes.  Has the patient traveled out of the country within the last 30 days? ---No  Does the patient have any new or worsening symptoms? ---Yes  Will a triage be completed? ---Yes  Related visit to physician within the last 2 weeks? ---No  Does the PT have any chronic conditions? (i.e. diabetes, asthma, etc.) ---Yes  List chronic conditions. ---Aneurysm, Stent  Is this a behavioral health or substance abuse call? ---No     Guidelines    Guideline Title Affirmed Question Affirmed Notes  Abdominal Pain - Female [1] SEVERE pain (e.g., excruciating) AND [2] present > 1 hour    Final Disposition User   Go to ED Now Wayne Sever, RN, [facility] - ED   Disagree/Comply: Comply

## 2015-07-30 NOTE — ED Notes (Signed)
Ac per pt preference

## 2015-07-30 NOTE — ED Notes (Signed)
C/o lower abd pain off and on for several months, this episode started yesterday. Denies n/v/d.

## 2015-07-30 NOTE — ED Notes (Addendum)
Pt reports that she has abdominal pain in her "descending colon" x several days. She states that it went away but has returned. She states that she called her pcp and was told to come here for tests.

## 2015-07-30 NOTE — Discharge Instructions (Signed)
As we discussed your workup today has show normal results including a CT scan. Your blood work did show "smudge cells" in a sample has been sent to pathology for further review. It disappears abnormal you should receive a phone call, however please have your primary care doctor follow-up on this result in the next 2-3 days. Please return to the emergency department for any increased pain, fever, vomiting, bloody or black stool. Please take Tylenol every 6 hours as written on the box for discomfort.   Abdominal Pain, Adult Many things can cause belly (abdominal) pain. Most times, the belly pain is not dangerous. Many cases of belly pain can be watched and treated at home. HOME CARE   Do not take medicines that help you go poop (laxatives) unless told to by your doctor.  Only take medicine as told by your doctor.  Eat or drink as told by your doctor. Your doctor will tell you if you should be on a special diet. GET HELP IF:  You do not know what is causing your belly pain.  You have belly pain while you are sick to your stomach (nauseous) or have runny poop (diarrhea).  You have pain while you pee or poop.  Your belly pain wakes you up at night.  You have belly pain that gets worse or better when you eat.  You have belly pain that gets worse when you eat fatty foods.  You have a fever. GET HELP RIGHT AWAY IF:   The pain does not go away within 2 hours.  You keep throwing up (vomiting).  The pain changes and is only in the right or left part of the belly.  You have bloody or tarry looking poop. MAKE SURE YOU:   Understand these instructions.  Will watch your condition.  Will get help right away if you are not doing well or get worse.   This information is not intended to replace advice given to you by your health care provider. Make sure you discuss any questions you have with your health care provider.   Document Released: 10/20/2007 Document Revised: 05/24/2014 Document  Reviewed: 01/10/2013 Elsevier Interactive Patient Education [employer].

## 2015-07-30 NOTE — Telephone Encounter (Signed)
Unable to reach pt at only contact #; spoke with [facility] ED registration and no pt there by that name.

## 2015-07-30 NOTE — ED Notes (Signed)
Pt discharged home after verbalizing understanding of discharge instructions; nad noted. 

## 2015-07-30 NOTE — ED Provider Notes (Signed)
[facility] Emergency Department Provider Note  Time seen: 2:30 PM  I have reviewed the triage vital signs and the nursing notes.   HISTORY  Chief Complaint Abdominal Pain    HPI Tiffany Martinez is a 82 y.o. female with a past medical history of anxiety, hyperlipidemia, hypertension, AAA presents the emergency department left lower quadrant abdominal pain. According to the patient several months ago she developed left lower quadrant abdominal pain which lasted several days and then resolved on its own. She has been pain-free since then until approximately 5 days ago and once again she developed left lower quadrant pain however this time it was constant and appeared to be progressively worsening surgical her doctor who referred her to the emergency department for evaluation. Patient does note loose stools, denies any black or bloody stools. Denies any nausea or vomiting. Denies any dysuria or hematuria. Denies a history of kidney stones. The patient does have a history of a AAA which she states is being monitored and is currently 3.6 cm. Describes her abdominal pain as moderate, dull aching in the left lower quadrant, 5/10. Patient took the Tylenol this morning which helped with her pain, she does not wish for anything else for discomfort.     Past Medical History  Diagnosis Date  . Anxiety   . Hyperlipidemia   . Hypertension   . Anemia   . Chronic venous insufficiency   . PAD (peripheral artery disease) ([place]) 2012    severe R common iliac stenosis, ABI 0.73, mid L common iliac stenosis ABI 1.1  . AAA (abdominal aortic aneurysm) ([place]) 2012    3.9cm, rec rpt 1 yr  . Hx of colonoscopy   . PAD (peripheral artery disease) ([place])   . Melanoma ([place])     Right Arm  . CLL (chronic lymphocytic leukemia) ([place]) 2014    Stage 0--by flow cytometry    Patient Active Problem List   Diagnosis Date Noted  . LLQ abdominal pain 08/27/2014  . Advanced directives,  counseling/discussion 01/15/2014  . AAA (abdominal aortic aneurysm) ([place])   . Routine general medical examination at a health care facility 07/07/2012  . History of melanoma 12/24/2011  . Atherosclerosis of native arteries of the extremities with intermittent claudication 05/03/2011  . Peripheral arterial disease ([place]) 12/21/2010  . History of squamous cell carcinoma of skin 09/28/2010  . Chronic anemia 04/02/2010  . VENOUS INSUFFICIENCY, CHRONIC 04/02/2010  . HYPERLIPIDEMIA 06/23/2009  . Episodic mood disorder ([place]) 06/23/2009  . HYPERTENSION 06/23/2009    Past Surgical History  Procedure Laterality Date  . Tonsillectomy and adenoidectomy  1960  . Knee arthroscopy  1998    right  . Cardiovascular stress test  2008    negative  . Dobutamine stress echo  11/11    negative  . Melanoma excision  11/09/10    wide excision right upper arm  . Abdominal aortic aneurysm repair  02/16/12    [facility]--Dr Sammuel Hines    Current Outpatient Rx  Name  Route  Sig  Dispense  Refill  . aspirin 81 MG tablet   Oral   Take 81 mg by mouth daily.           Marland Kitchen atorvastatin (LIPITOR) 20 MG tablet      TAKE 1 TABLET DAILY   90 tablet   3   . nitroGLYCERIN (NITROSTAT) 0.4 MG SL tablet   Sublingual   Place 0.4 mg under the tongue every 5 (five) minutes as needed.           Marland Kitchen  triamterene-hydrochlorothiazide (DYAZIDE) 37.5-25 MG per capsule      TAKE 1 CAPSULE DAILY   90 capsule   3     Allergies Review of patient's allergies indicates no known allergies.  Family History  Problem Relation Age of Onset  . Alzheimer's disease Mother   . Pancreatic cancer Father   . Coronary artery disease Neg Hx   . Diabetes Neg Hx   . Cancer Neg Hx     breast or colon    Social History Social History  Substance Use Topics  . Smoking status: Former Smoker    Types: Cigarettes    Quit date: 01/12/2012  . Smokeless tobacco: Never Used     Comment: pt states that she has 2-3 cigarettes per day   .  Alcohol Use: Yes     Comment: regular wine with dinner    Review of Systems Constitutional: Negative for fever. Cardiovascular: Negative for chest pain. Respiratory: Negative for shortness of breath. Gastrointestinal: Positive left lower quadrant pain. Negative for nausea or vomiting. Positive for loose stool. Negative for black or bloody stool. Genitourinary: Negative for dysuria. Musculoskeletal: Negative for back pain. Neurological: Negative for headache 10-point ROS otherwise negative.  ____________________________________________   PHYSICAL EXAM:  VITAL SIGNS: ED Triage Vitals  Enc Vitals Group     BP 07/30/15 1329 116/63 mmHg     Pulse Rate 07/30/15 1329 70     Resp 07/30/15 1329 18     Temp 07/30/15 1329 97.5 F (36.4 C)     Temp Source 07/30/15 1329 Oral     SpO2 07/30/15 1329 98 %     Weight 07/30/15 1330 166 lb (75.297 kg)     Height 07/30/15 1329 5\' 4"  (1.626 m)     Head Cir --      Peak Flow --      Pain Score 07/30/15 1330 4     Pain Loc --      Pain Edu? --      Excl. in [place]? --     Constitutional: Alert and oriented. Well appearing and in no distress. Eyes: Normal exam ENT   Head: Normocephalic and atraumatic.   Mouth/Throat: Mucous membranes are moist. Cardiovascular: Normal rate, regular rhythm. No murmur Respiratory: Normal respiratory effort without tachypnea nor retractions. Breath sounds are clear  Gastrointestinal: Soft and nontender. No distention. Musculoskeletal: Nontender with normal range of motion in all extremities.  Neurologic:  Normal speech and language. No gross focal neurologic deficits  Skin:  Skin is warm, dry and intact.  Psychiatric: Mood and affect are normal. Speech and behavior are normal.  ____________________________________________    EKG  EKG reviewed and interpreted by myself shows normal sinus rhythm at 64 bpm, narrow QRS, normal axis, normal intervals, no ST changes. Normal  EKG.  ____________________________________________    RADIOLOGY  CT shows no acute abnormality  ____________________________________________    INITIAL IMPRESSION / ASSESSMENT AND PLAN / ED COURSE  Pertinent labs & imaging results that were available during my care of the patient were reviewed by me and considered in my medical decision making (see chart for details).  Patient presents with 5 days of left lower quadrant pain, loose stool. Patient has a nontender abdomen. No left lower quadrant tenderness to palpation but states a 5/10 dull aching discomfort in that area. We will check labs, likely proceed with a CT abdomen/pelvis to further evaluate. Suspect likely colitis first diverticulitis.  CT is negative. Labs are largely at the patient's baseline. They  did cease much cells on the patient's CBC, and a sample has been sent to pathology for a smear. I made the patient aware of this and her primary care doctors to follow-up. At this time I do not have a clear cause for the patient's abdominal pain, she states it is very mild at this time, she believes he can be adequately controlled with Tylenol. I discussed with the patient primary care follow-up in 2-3 days, continue Tylenol use every 6 hours for discomfort, and I discussed my normal abdominal pain return precautions if the pain were to worsen, she develops fever, bloody/black stool, or vomiting. Patient is agreeable to this plan.  ____________________________________________   FINAL CLINICAL IMPRESSION(S) / ED DIAGNOSES  Left lower quadrant abdominal pain   Harvest Dark, MD 07/30/15 [phone]

## 2015-07-31 ENCOUNTER — Telehealth: Payer: Self-pay

## 2015-07-31 LAB — PATHOLOGIST SMEAR REVIEW

## 2015-07-31 NOTE — Telephone Encounter (Signed)
It looks like the pathology smear results have been finalized. Could you look at the results of the 07/30/15 pathology blood smear and let me know what I should tell the patient. Thanks. SHe is aware it may be next week.

## 2015-07-31 NOTE — Telephone Encounter (Signed)
Spoke to the patient to see how she was doing after her ER Visit.

## 2015-07-31 NOTE — Telephone Encounter (Signed)
I will keep an eye out for her pathology results she had done. It says they are still in process.

## 2015-08-02 NOTE — Telephone Encounter (Signed)
Please let her know that there were some abnormal cells on the smear--but nothing that looked striking or that requires immediate attention. She needs an appt to discuss all this--especially since I haven't seen her in a while

## 2015-08-04 NOTE — Telephone Encounter (Signed)
Spoke to patient. She said she has an appointment August 20, 2015.

## 2015-08-12 ENCOUNTER — Telehealth: Payer: Self-pay | Admitting: Internal Medicine

## 2015-08-12 NOTE — Telephone Encounter (Signed)
LM for pt to sch AWV on 4/5 at 11:15 with Lesia, mn

## 2015-08-20 ENCOUNTER — Ambulatory Visit (INDEPENDENT_AMBULATORY_CARE_PROVIDER_SITE_OTHER): Payer: Medicare Other | Admitting: Internal Medicine

## 2015-08-20 ENCOUNTER — Ambulatory Visit (INDEPENDENT_AMBULATORY_CARE_PROVIDER_SITE_OTHER): Payer: Medicare Other

## 2015-08-20 ENCOUNTER — Encounter: Payer: Self-pay | Admitting: Internal Medicine

## 2015-08-20 VITALS — BP 120/70 | HR 67 | Temp 98.1°F | Ht 64.0 in | Wt 171.2 lb

## 2015-08-20 DIAGNOSIS — I714 Abdominal aortic aneurysm, without rupture, unspecified: Secondary | ICD-10-CM

## 2015-08-20 DIAGNOSIS — F39 Unspecified mood [affective] disorder: Secondary | ICD-10-CM

## 2015-08-20 DIAGNOSIS — R1032 Left lower quadrant pain: Secondary | ICD-10-CM

## 2015-08-20 DIAGNOSIS — Z0000 Encounter for general adult medical examination without abnormal findings: Secondary | ICD-10-CM | POA: Diagnosis not present

## 2015-08-20 DIAGNOSIS — I10 Essential (primary) hypertension: Secondary | ICD-10-CM | POA: Diagnosis not present

## 2015-08-20 DIAGNOSIS — E2839 Other primary ovarian failure: Secondary | ICD-10-CM | POA: Diagnosis not present

## 2015-08-20 DIAGNOSIS — I739 Peripheral vascular disease, unspecified: Secondary | ICD-10-CM

## 2015-08-20 NOTE — Progress Notes (Signed)
Subjective:    Patient ID: Tiffany Martinez, female    DOB: Dec 13, 1933, 82 y.o.   MRN: [id number]  HPI Here for follow up of multiple medical issues  Reviewed ER records from 2+ weeks ago Will get episodic RLQ type pain---didn't clear in a few days and then got worse Exam benign and CT okay Bowels are regular--has cereal for fiber No blood in stools  Hives persisted and got persistently worse Tried going to every other day with statin and BP med Hives went away  Not excited about the statin Known PAD Recent imaging of AAA on CT scan--- stent still in place  Recent CBC in ER Nothing striking Smear review consistent with her CLL No fever No easy bruising or bleeding  Mood is better Place with [place]--likes watching swans and other wildlife Will still get spells of depressed mood--less than a day Not anhedonic Stress with brother--paralyzed from severe Parkinson's  Current Outpatient Prescriptions on File Prior to Visit  Medication Sig Dispense Refill  . aspirin 81 MG tablet Take 81 mg by mouth daily.      Marland Kitchen atorvastatin (LIPITOR) 20 MG tablet TAKE 1 TABLET DAILY (Patient taking differently: TAKE 1 TABLET every other day) 90 tablet 3  . nitroGLYCERIN (NITROSTAT) 0.4 MG SL tablet Place 0.4 mg under the tongue every 5 (five) minutes as needed.      . triamterene-hydrochlorothiazide (DYAZIDE) 37.5-25 MG per capsule TAKE 1 CAPSULE DAILY (Patient taking differently: TAKE 1 CAPSULE every other day) 90 capsule 3   No current facility-administered medications on file prior to visit.    No Known Allergies  Past Medical History  Diagnosis Date  . Anxiety   . Hyperlipidemia   . Hypertension   . Anemia   . Chronic venous insufficiency   . PAD (peripheral artery disease) ([place]) 2012    severe R common iliac stenosis, ABI 0.73, mid L common iliac stenosis ABI 1.1  . AAA (abdominal aortic aneurysm) ([place]) 2012    3.9cm, rec rpt 1 yr  . Hx of colonoscopy   . PAD (peripheral  artery disease) ([place])   . Melanoma ([place])     Right Arm  . CLL (chronic lymphocytic leukemia) ([place]) 2014    Stage 0--by flow cytometry    Past Surgical History  Procedure Laterality Date  . Tonsillectomy and adenoidectomy  1960  . Knee arthroscopy  1998    right  . Cardiovascular stress test  2008    negative  . Dobutamine stress echo  11/11    negative  . Melanoma excision  11/09/10    wide excision right upper arm  . Abdominal aortic aneurysm repair  02/16/12    [facility]--Dr Sammuel Hines    Family History  Problem Relation Age of Onset  . Alzheimer's disease Mother   . Pancreatic cancer Father   . Coronary artery disease Neg Hx   . Diabetes Neg Hx   . Cancer Neg Hx     breast or colon    Social History   Social History  . Marital Status: Widowed    Spouse Name: N/A  . Number of Children: 4  . Years of Education: N/A   Occupational History  . retired- Therapist, sports,     Social History Main Topics  . Smoking status: Current Every Day Smoker    Types: Cigarettes  . Smokeless tobacco: Never Used     Comment: pt states that she has 1-2 cigarettes per day with dinner  . Alcohol Use:  0.0 oz/week    0 Standard drinks or equivalent per week     Comment: regular wine with dinner  . Drug Use: No  . Sexual Activity: No   Other Topics Concern  . Not on file   Social History Narrative   Widowed 2/11 -2nd for him , 1st for her. 4 step sons. 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   Requests DNR--papers done   Requests no feeding tube            Review of Systems Appetite is good Weight is fairly stable--or up a few pounds Sleeps well--but naps easy in the afternoon (if she just sits)--but then feels refreshed     Objective:   Physical Exam  Constitutional: She appears well-developed and well-nourished. No distress.  HENT:  Mouth/Throat: Oropharynx is clear and moist.  No oropharyngeal exudate.  dentures  Neck: Normal range of motion. Neck supple. No thyromegaly present.  Cardiovascular: Normal rate, regular rhythm and normal heart sounds.  Exam reveals no gallop.   No murmur heard. Very faint pulses in feet  Pulmonary/Chest: Effort normal and breath sounds normal. No respiratory distress. She has no wheezes. She has no rales.  Abdominal: Soft. She exhibits no mass. There is no tenderness.  Musculoskeletal: She exhibits no tenderness.  Trace non pitting edema  Lymphadenopathy:    She has no cervical adenopathy.  Skin: No rash noted.  Psychiatric: She has a normal mood and affect. Her behavior is normal.          Assessment & Plan:

## 2015-08-20 NOTE — Assessment & Plan Note (Signed)
Intermittent Probably related to diverticulosis Discussed fiber, etc

## 2015-08-20 NOTE — Assessment & Plan Note (Signed)
BP Readings from Last 3 Encounters:  08/20/15 120/70  08/20/15 120/70  07/30/15 162/51   Doing well even with every other day Rx

## 2015-08-20 NOTE — Progress Notes (Signed)
Pre visit review using our clinic review tool, if applicable. No additional management support is needed unless otherwise documented below in the visit note. 

## 2015-08-20 NOTE — Assessment & Plan Note (Signed)
No claudication recently BP is fine No statin every other day

## 2015-08-20 NOTE — Assessment & Plan Note (Signed)
CT shows stent looks fine No action needed

## 2015-08-20 NOTE — Progress Notes (Signed)
   Subjective:    Patient ID: Tiffany Martinez, female    DOB: 07-29-1933, 82 y.o.   MRN: [id number]  HPI I reviewed health advisor's note, was available for consultation, and agree with documentation and plan.    Review of Systems     Objective:   Physical Exam        Assessment & Plan:

## 2015-08-20 NOTE — Patient Instructions (Signed)
Ms. Delatour , Thank you for taking time to come for your Medicare Wellness Visit. I appreciate your ongoing commitment to your health goals. Please review the following plan we discussed and let me know if I can assist you in the future.   These are the goals we discussed: Goals    . Increase physical activity     Starting 08/20/2015, I will continue to garden and walk for 60 min 3-4 days per week as weather permits.        This is a list of the screening recommended for you and due dates:  Health Maintenance  Topic Date Due  . DEXA scan (bone density measurement)  02/15/2016*  . Mammogram  05/17/2048*  . Flu Shot  12/16/2015  . Tetanus Vaccine  01/16/2024  . Shingles Vaccine  Completed  . Pneumonia vaccines  Completed  *Topic was postponed. The date shown is not the original due date.   Preventive Care for Adults  A healthy lifestyle and preventive care can promote health and wellness. Preventive health guidelines for adults include the following key practices.  . A routine yearly physical is a good way to check with your health care provider about your health and preventive screening. It is a chance to share any concerns and updates on your health and to receive a thorough exam.  . Visit your dentist for a routine exam and preventive care every 6 months. Brush your teeth twice a day and floss once a day. Good oral hygiene prevents tooth decay and gum disease.  . The frequency of eye exams is based on your age, health, family medical history, use  of contact lenses, and other factors. Follow your health care provider's ecommendations for frequency of eye exams.  . Eat a healthy diet. Foods like vegetables, fruits, whole grains, low-fat dairy products, and lean protein foods contain the nutrients you need without too many calories. Decrease your intake of foods high in solid fats, added sugars, and salt. Eat the right amount of calories for you. Get information about a proper diet  from your health care provider, if necessary.  . Regular physical exercise is one of the most important things you can do for your health. Most adults should get at least 150 minutes of moderate-intensity exercise (any activity that increases your heart rate and causes you to sweat) each week. In addition, most adults need muscle-strengthening exercises on 2 or more days a week.  Silver Sneakers may be a benefit available to you. To determine eligibility, you may visit the website: www.silversneakers.com or contact program at 670-248-8799 Mon-Fri between 8AM-8PM.   . Maintain a healthy weight. The body mass index (BMI) is a screening tool to identify possible weight problems. It provides an estimate of body fat based on height and weight. Your health care provider can find your BMI and can help you achieve or maintain a healthy weight.   For adults 20 years and older: ? A BMI below 18.5 is considered underweight. ? A BMI of 18.5 to 24.9 is normal. ? A BMI of 25 to 29.9 is considered overweight. ? A BMI of 30 and above is considered obese.   . Maintain normal blood lipids and cholesterol levels by exercising and minimizing your intake of saturated fat. Eat a balanced diet with plenty of fruit and vegetables. Blood tests for lipids and cholesterol should begin at age 50 and be repeated every 5 years. If your lipid or cholesterol levels are high, you are over  50, or you are at high risk for heart disease, you may need your cholesterol levels checked more frequently. Ongoing high lipid and cholesterol levels should be treated with medicines if diet and exercise are not working.  . If you smoke, find out from your health care provider how to quit. If you do not use tobacco, please do not start.  . If you choose to drink alcohol, please do not consume more than 2 drinks per day. One drink is considered to be 12 ounces (355 mL) of beer, 5 ounces (148 mL) of wine, or 1.5 ounces (44 mL) of liquor.  .  If you are 49-53 years old, ask your health care provider if you should take aspirin to prevent strokes.  . Use sunscreen. Apply sunscreen liberally and repeatedly throughout the day. You should seek shade when your shadow is shorter than you. Protect yourself by wearing long sleeves, pants, a wide-brimmed hat, and sunglasses year round, whenever you are outdoors.  . Once a month, do a whole body skin exam, using a mirror to look at the skin on your back. Tell your health care provider of new moles, moles that have irregular borders, moles that are larger than a pencil eraser, or moles that have changed in shape or color.

## 2015-08-20 NOTE — Progress Notes (Signed)
Subjective:   Tiffany Martinez is a 82 y.o. female who presents for Medicare Annual (Subsequent) preventive examination.  Cardiac Risk Factors include: advanced age (>54men, >25 women);dyslipidemia;hypertension     Objective:     Vitals: BP 120/70 mmHg  Pulse 67  Temp(Src) 98.1 F (36.7 C) (Oral)  Ht 5\' 4"  (1.626 m)  Wt 171 lb 4 oz (77.678 kg)  BMI 29.38 kg/m2  SpO2 96%  Body mass index is 29.38 kg/(m^2).   Tobacco History  Smoking status  . Current Every Day Smoker  . Types: Cigarettes  Smokeless tobacco  . Never Used    Comment: pt states that she has 1-2 cigarettes per day with dinner     Ready to quit: Not Answered Counseling given: No   Past Medical History  Diagnosis Date  . Anxiety   . Hyperlipidemia   . Hypertension   . Anemia   . Chronic venous insufficiency   . PAD (peripheral artery disease) ([place]) 2012    severe R common iliac stenosis, ABI 0.73, mid L common iliac stenosis ABI 1.1  . AAA (abdominal aortic aneurysm) ([place]) 2012    3.9cm, rec rpt 1 yr  . Hx of colonoscopy   . PAD (peripheral artery disease) ([place])   . Melanoma ([place])     Right Arm  . CLL (chronic lymphocytic leukemia) ([place]) 2014    Stage 0--by flow cytometry   Past Surgical History  Procedure Laterality Date  . Tonsillectomy and adenoidectomy  1960  . Knee arthroscopy  1998    right  . Cardiovascular stress test  2008    negative  . Dobutamine stress echo  11/11    negative  . Melanoma excision  11/09/10    wide excision right upper arm  . Abdominal aortic aneurysm repair  02/16/12    [facility]--Dr Sammuel Hines   Family History  Problem Relation Age of Onset  . Alzheimer's disease Mother   . Pancreatic cancer Father   . Coronary artery disease Neg Hx   . Diabetes Neg Hx   . Cancer Neg Hx     breast or colon   History  Sexual Activity  . Sexual Activity: No    Outpatient Encounter Prescriptions as of 08/20/2015  Medication Sig  . aspirin 81 MG tablet Take 81 mg by mouth daily.     Marland Kitchen atorvastatin (LIPITOR) 20 MG tablet TAKE 1 TABLET DAILY (Patient taking differently: TAKE 1 TABLET every other day)  . nitroGLYCERIN (NITROSTAT) 0.4 MG SL tablet Place 0.4 mg under the tongue every 5 (five) minutes as needed.    . triamterene-hydrochlorothiazide (DYAZIDE) 37.5-25 MG per capsule TAKE 1 CAPSULE DAILY (Patient taking differently: TAKE 1 CAPSULE every other day)   No facility-administered encounter medications on file as of 08/20/2015.    Activities of Daily Living In your present state of health, do you have any difficulty performing the following activities: 08/20/2015  Hearing? N  Vision? N  Difficulty concentrating or making decisions? Y  Walking or climbing stairs? N  Dressing or bathing? N  Preparing Food and eating ? N  Using the Toilet? N  In the past six months, have you accidently leaked urine? N  Do you have problems with loss of bowel control? N  Managing your Medications? N  Managing your Finances? N  Housekeeping or managing your Housekeeping? N    Patient Care Team: Venia Carbon, MD as PCP - General Sherren Mocha, MD (Cardiology)    Assessment:  Vision Screening Comments: Last eye exam at [facility] in Feb 2017  Exercise Activities and Dietary recommendations Current Exercise Habits: Home exercise routine, Type of exercise: walking (gardening), Time (Minutes): 60, Frequency (Times/Week): 4, Weekly Exercise (Minutes/Week): 240, Intensity: Mild, Exercise limited by: None identified  Goals    . Increase physical activity     Starting 08/20/2015, I will continue to garden and walk for 60 min 3-4 days per week as weather permits.       Fall Risk Fall Risk  08/20/2015 01/15/2014 07/07/2012  Falls in the past year? No No No   Depression Screen PHQ 2/9 Scores 08/20/2015 01/15/2014 07/07/2012 06/14/2011  PHQ - 2 Score 0 1 2 2   PHQ- 9 Score - - 2 -     Cognitive Testing MMSE - Mini Mental State Exam 08/20/2015  Orientation to time 5    Orientation to Place 5  Registration 3  Attention/ Calculation 0  Recall 3  Language- name 2 objects 0  Language- repeat 1  Language- follow 3 step command 3  Language- read & follow direction 0  Write a sentence 0  Copy design 0  Total score 20   PLEASE NOTE: A Mini-Cog screen was completed. Maximum score is 20. A value of 0 denotes this part of [place] MMSE was not completed.  Orientation to Time - Max 5 Orientation to Place - Max 5 Registration - Max 3 Recall - Max 3 Language Repeat - Max 1 Language Follow 3 Step Command - Max 3  Immunization History  Administered Date(s) Administered  . Influenza Split 04/01/2011, 03/20/2012  . Influenza Whole 04/02/2010  . Influenza,inj,Quad PF,36+ Mos 02/05/2013, 01/15/2014  . Pneumococcal Conjugate-13 01/15/2014  . Pneumococcal Polysaccharide-23 05/18/2003  . Td 05/18/2003, 01/15/2014  . Zoster 05/17/2008, 08/30/2008   Screening Tests Health Maintenance  Topic Date Due  . DEXA SCAN - order generated; office will schedule 02/15/2016 (Originally 03/03/2001)  . MAMMOGRAM  05/17/2048 (Originally 10/01/2011)  . INFLUENZA VACCINE  12/16/2015  . TETANUS/TDAP  01/16/2024  . ZOSTAVAX  Completed  . PNA vac Low Risk Adult  Completed      Plan:     I have personally reviewed and addressed the Medicare Annual Wellness questionnaire and have noted the following in the patient's chart:  A. Medical and social history B. Use of alcohol, tobacco or illicit drugs  C. Current medications and supplements D. Functional ability and status E.  Nutritional status F.  Physical activity G. Advance directives H. List of other physicians I.  Hospitalizations, surgeries, and ER visits in previous 12 months J.  [place] to include hearing, vision, cognitive, depression L. Referrals and appointments - bone density  In addition, I have reviewed and discussed with patient certain preventive protocols, quality metrics, and best practice  recommendations. A written personalized care plan for preventive services as well as general preventive health recommendations were provided to patient.  See attached scanned questionnaire for additional information.   Signed,   Lindell Noe, MHA, BS, LPN Health Advisor [id number]

## 2015-08-20 NOTE — Assessment & Plan Note (Signed)
Episodic depressed mood No MDD Treatment not needed Not particularly social--but does enjoy [place]

## 2015-09-05 DIAGNOSIS — Z08 Encounter for follow-up examination after completed treatment for malignant neoplasm: Secondary | ICD-10-CM | POA: Diagnosis not present

## 2015-09-05 DIAGNOSIS — X32XXXA Exposure to sunlight, initial encounter: Secondary | ICD-10-CM | POA: Diagnosis not present

## 2015-09-05 DIAGNOSIS — L858 Other specified epidermal thickening: Secondary | ICD-10-CM | POA: Diagnosis not present

## 2015-09-05 DIAGNOSIS — Z85820 Personal history of malignant melanoma of skin: Secondary | ICD-10-CM | POA: Diagnosis not present

## 2015-09-05 DIAGNOSIS — L570 Actinic keratosis: Secondary | ICD-10-CM | POA: Diagnosis not present

## 2015-09-05 DIAGNOSIS — D0439 Carcinoma in situ of skin of other parts of face: Secondary | ICD-10-CM | POA: Diagnosis not present

## 2015-09-05 DIAGNOSIS — D485 Neoplasm of uncertain behavior of skin: Secondary | ICD-10-CM | POA: Diagnosis not present

## 2015-09-08 ENCOUNTER — Ambulatory Visit
Admission: RE | Admit: 2015-09-08 | Discharge: 2015-09-08 | Disposition: A | Discharge status: Home / Self Care | Payer: Medicare Other | Source: Ambulatory Visit | Attending: Internal Medicine | Admitting: Internal Medicine

## 2015-09-08 DIAGNOSIS — E2839 Other primary ovarian failure: Secondary | ICD-10-CM | POA: Insufficient documentation

## 2015-09-08 DIAGNOSIS — Z780 Asymptomatic menopausal state: Secondary | ICD-10-CM | POA: Diagnosis not present

## 2015-09-08 DIAGNOSIS — M8580 Other specified disorders of bone density and structure, unspecified site: Secondary | ICD-10-CM | POA: Insufficient documentation

## 2015-09-08 DIAGNOSIS — Z13820 Encounter for screening for osteoporosis: Secondary | ICD-10-CM | POA: Insufficient documentation

## 2015-09-08 DIAGNOSIS — M85852 Other specified disorders of bone density and structure, left thigh: Secondary | ICD-10-CM | POA: Diagnosis not present

## 2015-09-26 DIAGNOSIS — C44722 Squamous cell carcinoma of skin of right lower limb, including hip: Secondary | ICD-10-CM | POA: Diagnosis not present

## 2015-12-30 ENCOUNTER — Emergency Department: Payer: Medicare Other

## 2015-12-30 ENCOUNTER — Observation Stay
Admission: EM | Admit: 2015-12-30 | Discharge: 2015-12-31 | Disposition: A | Discharge status: Home / Self Care | Payer: Medicare Other | Attending: Specialist | Admitting: Specialist

## 2015-12-30 DIAGNOSIS — Z7982 Long term (current) use of aspirin: Secondary | ICD-10-CM | POA: Diagnosis not present

## 2015-12-30 DIAGNOSIS — F17210 Nicotine dependence, cigarettes, uncomplicated: Secondary | ICD-10-CM | POA: Diagnosis not present

## 2015-12-30 DIAGNOSIS — F39 Unspecified mood [affective] disorder: Secondary | ICD-10-CM | POA: Diagnosis not present

## 2015-12-30 DIAGNOSIS — D649 Anemia, unspecified: Secondary | ICD-10-CM | POA: Diagnosis not present

## 2015-12-30 DIAGNOSIS — D259 Leiomyoma of uterus, unspecified: Principal | ICD-10-CM | POA: Insufficient documentation

## 2015-12-30 DIAGNOSIS — Z79899 Other long term (current) drug therapy: Secondary | ICD-10-CM | POA: Insufficient documentation

## 2015-12-30 DIAGNOSIS — I700 Atherosclerosis of aorta: Secondary | ICD-10-CM | POA: Insufficient documentation

## 2015-12-30 DIAGNOSIS — Z820 Family history of epilepsy and other diseases of the nervous system: Secondary | ICD-10-CM | POA: Insufficient documentation

## 2015-12-30 DIAGNOSIS — Z85820 Personal history of malignant melanoma of skin: Secondary | ICD-10-CM | POA: Insufficient documentation

## 2015-12-30 DIAGNOSIS — C9110 Chronic lymphocytic leukemia of B-cell type not having achieved remission: Secondary | ICD-10-CM | POA: Diagnosis not present

## 2015-12-30 DIAGNOSIS — I739 Peripheral vascular disease, unspecified: Secondary | ICD-10-CM | POA: Insufficient documentation

## 2015-12-30 DIAGNOSIS — N83202 Unspecified ovarian cyst, left side: Secondary | ICD-10-CM | POA: Insufficient documentation

## 2015-12-30 DIAGNOSIS — E785 Hyperlipidemia, unspecified: Secondary | ICD-10-CM | POA: Insufficient documentation

## 2015-12-30 DIAGNOSIS — N83201 Unspecified ovarian cyst, right side: Secondary | ICD-10-CM | POA: Insufficient documentation

## 2015-12-30 DIAGNOSIS — R109 Unspecified abdominal pain: Secondary | ICD-10-CM | POA: Diagnosis present

## 2015-12-30 DIAGNOSIS — Z8679 Personal history of other diseases of the circulatory system: Secondary | ICD-10-CM | POA: Diagnosis not present

## 2015-12-30 DIAGNOSIS — R1032 Left lower quadrant pain: Secondary | ICD-10-CM | POA: Diagnosis not present

## 2015-12-30 DIAGNOSIS — R1030 Lower abdominal pain, unspecified: Secondary | ICD-10-CM

## 2015-12-30 DIAGNOSIS — I70219 Atherosclerosis of native arteries of extremities with intermittent claudication, unspecified extremity: Secondary | ICD-10-CM | POA: Insufficient documentation

## 2015-12-30 DIAGNOSIS — K5730 Diverticulosis of large intestine without perforation or abscess without bleeding: Secondary | ICD-10-CM | POA: Diagnosis not present

## 2015-12-30 DIAGNOSIS — F419 Anxiety disorder, unspecified: Secondary | ICD-10-CM | POA: Insufficient documentation

## 2015-12-30 DIAGNOSIS — K551 Chronic vascular disorders of intestine: Secondary | ICD-10-CM | POA: Insufficient documentation

## 2015-12-30 DIAGNOSIS — I10 Essential (primary) hypertension: Secondary | ICD-10-CM | POA: Diagnosis not present

## 2015-12-30 DIAGNOSIS — Z800 Family history of malignant neoplasm of digestive organs: Secondary | ICD-10-CM | POA: Diagnosis not present

## 2015-12-30 DIAGNOSIS — I872 Venous insufficiency (chronic) (peripheral): Secondary | ICD-10-CM | POA: Diagnosis not present

## 2015-12-30 DIAGNOSIS — D72829 Elevated white blood cell count, unspecified: Secondary | ICD-10-CM | POA: Insufficient documentation

## 2015-12-30 DIAGNOSIS — K449 Diaphragmatic hernia without obstruction or gangrene: Secondary | ICD-10-CM | POA: Diagnosis not present

## 2015-12-30 LAB — CBC WITH DIFFERENTIAL/PLATELET
BASOS PCT: 0 %
Basophils Absolute: 0 10*3/uL (ref 0–0.1)
EOS PCT: 0 %
Eosinophils Absolute: 0 10*3/uL (ref 0–0.7)
HCT: 32.3 % — ABNORMAL LOW (ref 35.0–47.0)
Hemoglobin: 11.2 g/dL — ABNORMAL LOW (ref 12.0–16.0)
LYMPHS ABS: 13.7 10*3/uL — AB (ref 1.0–3.6)
Lymphocytes Relative: 66 %
MCH: 35.9 pg — AB (ref 26.0–34.0)
MCHC: 34.8 g/dL (ref 32.0–36.0)
MCV: 103 fL — AB (ref 80.0–100.0)
MONO ABS: 0.4 10*3/uL (ref 0.2–0.9)
Monocytes Relative: 2 %
NEUTROS PCT: 32 %
Neutro Abs: 6.6 10*3/uL — ABNORMAL HIGH (ref 1.4–6.5)
PLATELETS: 114 10*3/uL — AB (ref 150–440)
RBC: 3.13 MIL/uL — ABNORMAL LOW (ref 3.80–5.20)
RDW: 15.9 % — ABNORMAL HIGH (ref 11.5–14.5)
WBC: 20.7 10*3/uL — ABNORMAL HIGH (ref 3.6–11.0)

## 2015-12-30 LAB — COMPREHENSIVE METABOLIC PANEL
ALK PHOS: 70 U/L (ref 38–126)
ALT: 18 U/L (ref 14–54)
AST: 21 U/L (ref 15–41)
Albumin: 4 g/dL (ref 3.5–5.0)
Anion gap: 6 (ref 5–15)
BILIRUBIN TOTAL: 0.1 mg/dL — AB (ref 0.3–1.2)
BUN: 23 mg/dL — ABNORMAL HIGH (ref 6–20)
CALCIUM: 9.3 mg/dL (ref 8.9–10.3)
CO2: 24 mmol/L (ref 22–32)
CREATININE: 1.04 mg/dL — AB (ref 0.44–1.00)
Chloride: 108 mmol/L (ref 101–111)
GFR, EST AFRICAN AMERICAN: 58 mL/min — AB (ref >60)
GFR, EST NON AFRICAN AMERICAN: 50 mL/min — AB (ref >60)
Glucose, Bld: 142 mg/dL — ABNORMAL HIGH (ref 65–99)
Potassium: 3.6 mmol/L (ref 3.5–5.1)
Sodium: 138 mmol/L (ref 135–145)
Total Protein: 6.8 g/dL (ref 6.5–8.1)

## 2015-12-30 LAB — URINALYSIS COMPLETE WITH MICROSCOPIC (ARMC ONLY)
Bacteria, UA: NONE SEEN
Bilirubin Urine: NEGATIVE
Glucose, UA: NEGATIVE mg/dL
Hgb urine dipstick: NEGATIVE
KETONES UR: NEGATIVE mg/dL
Leukocytes, UA: NEGATIVE
Nitrite: NEGATIVE
PROTEIN: NEGATIVE mg/dL
Specific Gravity, Urine: 1.032 — ABNORMAL HIGH (ref 1.005–1.030)
pH: 5 (ref 5.0–8.0)

## 2015-12-30 LAB — LIPASE, BLOOD: LIPASE: 28 U/L (ref 11–51)

## 2015-12-30 LAB — LACTIC ACID, PLASMA
Lactic Acid, Venous: 0.9 mmol/L (ref 0.5–1.9)
Lactic Acid, Venous: 0.9 mmol/L (ref 0.5–1.9)

## 2015-12-30 LAB — TROPONIN I

## 2015-12-30 MED ORDER — ACETAMINOPHEN 325 MG PO TABS: 650.0000 mg | ORAL_TABLET | Freq: Four times a day (QID) | ORAL | Status: DC | PRN

## 2015-12-30 MED ORDER — ONDANSETRON HCL 4 MG PO TABS: 4.0000 mg | ORAL_TABLET | Freq: Four times a day (QID) | ORAL | Status: DC | PRN

## 2015-12-30 MED ORDER — TRAZODONE HCL 50 MG PO TABS: 25.0000 mg | ORAL_TABLET | Freq: Every evening | ORAL | Status: DC | PRN

## 2015-12-30 MED ORDER — ACETAMINOPHEN 650 MG RE SUPP: 650.0000 mg | Freq: Four times a day (QID) | RECTAL | Status: DC | PRN

## 2015-12-30 MED ORDER — ONDANSETRON HCL 4 MG/2ML IJ SOLN: 4.0000 mg | Freq: Four times a day (QID) | INTRAMUSCULAR | Status: DC | PRN

## 2015-12-30 MED ORDER — FAMOTIDINE IN NACL 20-0.9 MG/50ML-% IV SOLN
20.0000 mg | INTRAVENOUS | Status: DC
  Administered 2015-12-30: 20 mg via INTRAVENOUS
  Filled 2015-12-30 (×2): qty 50

## 2015-12-30 MED ORDER — HYDROMORPHONE HCL 1 MG/ML IJ SOLN
0.5000 mg | Freq: Once | INTRAMUSCULAR | Status: AC
Start: 2015-12-30 — End: 2015-12-30
  Administered 2015-12-30: 0.5 mg via INTRAVENOUS
  Filled 2015-12-30: qty 1

## 2015-12-30 MED ORDER — HYDROMORPHONE HCL 1 MG/ML PO LIQD: 1.0000 mg | Freq: Once | ORAL | Status: DC

## 2015-12-30 MED ORDER — DOCUSATE SODIUM 100 MG PO CAPS
100.0000 mg | ORAL_CAPSULE | Freq: Two times a day (BID) | ORAL | Status: DC
  Administered 2015-12-30 – 2015-12-31 (×2): 100 mg via ORAL
  Filled 2015-12-30 (×2): qty 1

## 2015-12-30 MED ORDER — BISACODYL 5 MG PO TBEC: 5.0000 mg | DELAYED_RELEASE_TABLET | Freq: Every day | ORAL | Status: DC | PRN

## 2015-12-30 MED ORDER — ONDANSETRON HCL 4 MG/2ML IJ SOLN
4.0000 mg | Freq: Once | INTRAMUSCULAR | Status: AC
  Administered 2015-12-30: 4 mg via INTRAVENOUS
  Filled 2015-12-30: qty 2

## 2015-12-30 MED ORDER — TRIAMTERENE-HCTZ 37.5-25 MG PO TABS
1.0000 | ORAL_TABLET | Freq: Every day | ORAL | Status: DC
  Administered 2015-12-30: 19:00:00 1 via ORAL
  Filled 2015-12-30 (×2): qty 1

## 2015-12-30 MED ORDER — SODIUM CHLORIDE 0.9 % IV BOLUS (SEPSIS)
1000.0000 mL | Freq: Once | INTRAVENOUS | Status: AC
  Administered 2015-12-30: 1000 mL via INTRAVENOUS

## 2015-12-30 MED ORDER — HYDROMORPHONE HCL 1 MG/ML IJ SOLN
0.5000 mg | Freq: Once | INTRAMUSCULAR | Status: AC
  Administered 2015-12-30: 0.5 mg via INTRAVENOUS

## 2015-12-30 MED ORDER — HEPARIN SODIUM (PORCINE) 5000 UNIT/ML IJ SOLN
5000.0000 [IU] | Freq: Three times a day (TID) | INTRAMUSCULAR | Status: DC
  Administered 2015-12-30 – 2015-12-31 (×3): 5000 [IU] via SUBCUTANEOUS
  Filled 2015-12-30 (×3): qty 1

## 2015-12-30 MED ORDER — ASPIRIN EC 81 MG PO TBEC
81.0000 mg | DELAYED_RELEASE_TABLET | Freq: Every day | ORAL | Status: DC
  Administered 2015-12-30: 81 mg via ORAL
  Filled 2015-12-30: qty 1

## 2015-12-30 MED ORDER — IOPAMIDOL (ISOVUE-370) INJECTION 76%
100.0000 mL | Freq: Once | INTRAVENOUS | Status: AC | PRN
  Administered 2015-12-30: 100 mL via INTRAVENOUS

## 2015-12-30 MED ORDER — ATORVASTATIN CALCIUM 20 MG PO TABS
20.0000 mg | ORAL_TABLET | Freq: Every day | ORAL | Status: DC
  Administered 2015-12-30: 20:00:00 20 mg via ORAL
  Filled 2015-12-30: qty 1

## 2015-12-30 MED ORDER — HYDROCODONE-ACETAMINOPHEN 5-325 MG PO TABS
1.0000 | ORAL_TABLET | ORAL | Status: DC | PRN
  Administered 2015-12-30 – 2015-12-31 (×2): 1 via ORAL
  Filled 2015-12-30 (×2): qty 1

## 2015-12-30 MED ORDER — SODIUM CHLORIDE 0.9 % IV SOLN
INTRAVENOUS | Status: DC
  Administered 2015-12-30: 19:00:00 via INTRAVENOUS

## 2015-12-30 NOTE — Progress Notes (Signed)
Dr. Vianne Bulls notified patient has yellow DNR form from [facility] and patient verified this. MD to correct order.

## 2015-12-30 NOTE — ED Triage Notes (Addendum)
BIB EMS from [facility] independent living c/o LLQ that started last night about 6pm. Pt reports nausea, vomiting today and diarrhea last night. Given Zofran 4mg  by EMS en route. Hx of Abd Aneurysm 2012 with sent placement

## 2015-12-30 NOTE — H&P (Signed)
[facility] NAME: Tiffany Martinez    MR#:  [id number]  DATE OF BIRTH:  12-07-1933  DATE OF ADMISSION:  12/30/2015  PRIMARY CARE PHYSICIAN: Viviana Simpler, MD   REQUESTING/REFERRING PHYSICIAN: Dr. Lisa Roca  CHIEF COMPLAINT: Abdominal pain    Chief Complaint  Patient presents with  . Abdominal Pain    HISTORY OF PRESENT ILLNESS:  Tiffany Martinez  is a 82 y.o. female with a known history of aaarepair, hypertension, hyperlipidemia, comes in because of left lower quadrant abdominal pain of sudden onset started last night. When she came she was in severe pain 10 out of 10 severity. Pain now comes and goes and kind of stabbing pain. No aggravating factors or relieving factors. Patient has no diarrhea . Felt nauseous this morning. No burning of stomach. Patient has a left lower quadrant pain now radiating down to the right side. No fever. No constipation. Abdominal CAT scan showed chronic mesenteric ischemia, ultrasound of abdomen showed left ovarian cyst.  Has elevated wbc upto 20. PAST MEDICAL HISTORY:   Past Medical History:  Diagnosis Date  . AAA (abdominal aortic aneurysm) ([place]) 2012   3.9cm, rec rpt 1 yr  . Anemia   . Anxiety   . Chronic venous insufficiency   . CLL (chronic lymphocytic leukemia) ([place]) 2014   Stage 0--by flow cytometry  . Hx of colonoscopy   . Hyperlipidemia   . Hypertension   . Melanoma ([place])    Right Arm  . PAD (peripheral artery disease) ([place]) 2012   severe R common iliac stenosis, ABI 0.73, mid L common iliac stenosis ABI 1.1  . PAD (peripheral artery disease) ([place])     PAST SURGICAL HISTOIRY:   Past Surgical History:  Procedure Laterality Date  . ABDOMINAL AORTIC ANEURYSM REPAIR  02/16/12   [facility]--Dr Sammuel Hines  . CARDIOVASCULAR STRESS TEST  2008   negative  . DOBUTAMINE STRESS ECHO  11/11   negative  . KNEE ARTHROSCOPY  1998   right  . MELANOMA EXCISION  11/09/10   wide excision right  upper arm  . TONSILLECTOMY AND ADENOIDECTOMY  1960    SOCIAL HISTORY:   Social History  Substance Use Topics  . Smoking status: Current Every Day Smoker    Types: Cigarettes  . Smokeless tobacco: Never Used     Comment: pt states that she has 1-2 cigarettes per day with dinner  . Alcohol use 0.0 oz/week     Comment: regular wine with dinner    FAMILY HISTORY:   Family History  Problem Relation Age of Onset  . Alzheimer's disease Mother   . Pancreatic cancer Father   . Coronary artery disease Neg Hx   . Diabetes Neg Hx   . Cancer Neg Hx     breast or colon    DRUG ALLERGIES:  No Known Allergies  REVIEW OF SYSTEMS:  CONSTITUTIONAL: No fever, fatigue or weakness.  EYES: No blurred or double vision.  EARS, NOSE, AND THROAT: No tinnitus or ear pain.  RESPIRATORY: No cough, shortness of breath, wheezing or hemoptysis.  CARDIOVASCULAR: No chest pain, orthopnea, edema.  GASTROINTESTINAL: No nausea, vomiting, diarrhea or abdominal pain.  GENITOURINARY: No dysuria, hematuria.  ENDOCRINE: No polyuria, nocturia,  HEMATOLOGY: No anemia, easy bruising or bleeding SKIN: No rash or lesion. MUSCULOSKELETAL: No joint pain or arthritis.   NEUROLOGIC: No tingling, numbness, weakness.  PSYCHIATRY: No anxiety or depression.   MEDICATIONS AT HOME:   Prior to  Admission medications   Medication Sig Start Date End Date Taking? Authorizing Provider  aspirin 81 MG tablet Take 81 mg by mouth daily.     Yes Historical Provider, MD  atorvastatin (LIPITOR) 20 MG tablet Take 20 mg by mouth daily.   Yes Historical Provider, MD  nitroGLYCERIN (NITROSTAT) 0.4 MG SL tablet Place 0.4 mg under the tongue every 5 (five) minutes as needed.     Yes Historical Provider, MD  triamterene-hydrochlorothiazide (MAXZIDE-25) 37.5-25 MG tablet Take 1 tablet by mouth daily.   Yes Historical Provider, MD      VITAL SIGNS:  Blood pressure (!) 150/68, pulse (!) 49, temperature 97.2 F (36.2 C), temperature  source Oral, resp. rate (!) 9, height 5\' 4"  (1.626 m), weight 72.6 kg (160 lb), SpO2 99 %.  PHYSICAL EXAMINATION:  GENERAL:  82 y.o.-year-old patient lying in the bed with no acute distress.  EYES: Pupils equal, round, reactive to light and accommodation. No scleral icterus. Extraocular muscles intact.  HEENT: Head atraumatic, normocephalic. Oropharynx and nasopharynx clear.  NECK:  Supple, no jugular venous distention. No thyroid enlargement, no tenderness.  LUNGS: Normal breath sounds bilaterally, no wheezing, rales,rhonchi or crepitation. No use of accessory muscles of respiration.  CARDIOVASCULAR: S1, S2 normal. No murmurs, rubs, or gallops.  ABDOMEN: Soft, nontender, nondistended. Bowel sounds present. No organomegaly or mass.  EXTREMITIES: No pedal edema, cyanosis, or clubbing.  NEUROLOGIC: Cranial nerves II through XII are intact. Muscle strength 5/5 in all extremities. Sensation intact. Gait not checked.  PSYCHIATRIC: The patient is alert and oriented x 3.  SKIN: No obvious rash, lesion, or ulcer.   LABORATORY PANEL:   CBC  Recent Labs Lab 12/30/15 0840  WBC 20.7*  HGB 11.2*  HCT 32.3*  PLT 114*   ------------------------------------------------------------------------------------------------------------------  Chemistries   Recent Labs Lab 12/30/15 0840  NA 138  K 3.6  CL 108  CO2 24  GLUCOSE 142*  BUN 23*  CREATININE 1.04*  CALCIUM 9.3  AST 21  ALT 18  ALKPHOS 70  BILITOT 0.1*   ------------------------------------------------------------------------------------------------------------------  Cardiac Enzymes  Recent Labs Lab 12/30/15 0840  TROPONINI <0.03   ------------------------------------------------------------------------------------------------------------------  RADIOLOGY:  US Transvaginal Non-ob  Result Date: 12/30/2015 CLINICAL DATA:  Lower abdominal pain for 2 days, left worse than right. Intermittent pain since 06/2015. EXAM:  TRANSABDOMINAL AND TRANSVAGINAL ULTRASOUND OF PELVIS DOPPLER ULTRASOUND OF OVARIES TECHNIQUE: Both transabdominal and transvaginal ultrasound examinations of the pelvis were performed. Transabdominal technique was performed for global imaging of the pelvis including uterus, ovaries, adnexal regions, and pelvic cul-de-sac. It was necessary to proceed with endovaginal exam following the transabdominal exam to visualize the uterus, endometrium, and ovaries. Color and duplex Doppler ultrasound was utilized to evaluate blood flow to the ovaries. COMPARISON:  CTA abdomen and pelvis earlier today FINDINGS: Uterus Measurements: 6.3 x 2.2 x 3.6 cm. Hypoechoic lesion with internal blood flow in the fundal region on the left measuring 2.3 x 1.6 x 2.5 cm. Endometrium Poorly visualized. Heterogeneous appearance at the level of the cervix which may reflect nabothian cysts. Right ovary Not visualized due to bowel gas. Left ovary Not clearly identified separate from the below mass. There is a 6.0 x 3.4 x 6.8 cm complex solid and cystic mass with echogenic foci/calcification and internal arterial and venous blood flow in the left adnexa/cul-de-sac. Other findings Small amount of free fluid in the adnexa bilaterally. IMPRESSION: 1. 6.8 cm complex left adnexal mass. This may reflect a pedunculated subserosal fibroid given its appearance on multiple  prior CT examinations, however an ovarian neoplasm is not excluded. Further evaluation with nonurgent pelvic MRI is recommended. 2. Possible 2.5 cm fibroid in the uterine fundus. 3. Small volume pelvic free fluid. Electronically Signed   By: Logan Bores M.D.   On: 12/30/2015 16:26   US Pelvis Complete  Result Date: 12/30/2015 CLINICAL DATA:  Lower abdominal pain for 2 days, left worse than right. Intermittent pain since 06/2015. EXAM: TRANSABDOMINAL AND TRANSVAGINAL ULTRASOUND OF PELVIS DOPPLER ULTRASOUND OF OVARIES TECHNIQUE: Both transabdominal and transvaginal ultrasound examinations  of the pelvis were performed. Transabdominal technique was performed for global imaging of the pelvis including uterus, ovaries, adnexal regions, and pelvic cul-de-sac. It was necessary to proceed with endovaginal exam following the transabdominal exam to visualize the uterus, endometrium, and ovaries. Color and duplex Doppler ultrasound was utilized to evaluate blood flow to the ovaries. COMPARISON:  CTA abdomen and pelvis earlier today FINDINGS: Uterus Measurements: 6.3 x 2.2 x 3.6 cm. Hypoechoic lesion with internal blood flow in the fundal region on the left measuring 2.3 x 1.6 x 2.5 cm. Endometrium Poorly visualized. Heterogeneous appearance at the level of the cervix which may reflect nabothian cysts. Right ovary Not visualized due to bowel gas. Left ovary Not clearly identified separate from the below mass. There is a 6.0 x 3.4 x 6.8 cm complex solid and cystic mass with echogenic foci/calcification and internal arterial and venous blood flow in the left adnexa/cul-de-sac. Other findings Small amount of free fluid in the adnexa bilaterally. IMPRESSION: 1. 6.8 cm complex left adnexal mass. This may reflect a pedunculated subserosal fibroid given its appearance on multiple prior CT examinations, however an ovarian neoplasm is not excluded. Further evaluation with nonurgent pelvic MRI is recommended. 2. Possible 2.5 cm fibroid in the uterine fundus. 3. Small volume pelvic free fluid. Electronically Signed   By: Logan Bores M.D.   On: 12/30/2015 16:26   [place] Art/ven Flow Abd Pelv Doppler  Result Date: 12/30/2015 CLINICAL DATA:  Lower abdominal pain for 2 days, left worse than right. Intermittent pain since 06/2015. EXAM: TRANSABDOMINAL AND TRANSVAGINAL ULTRASOUND OF PELVIS DOPPLER ULTRASOUND OF OVARIES TECHNIQUE: Both transabdominal and transvaginal ultrasound examinations of the pelvis were performed. Transabdominal technique was performed for global imaging of the pelvis including uterus, ovaries, adnexal  regions, and pelvic cul-de-sac. It was necessary to proceed with endovaginal exam following the transabdominal exam to visualize the uterus, endometrium, and ovaries. Color and duplex Doppler ultrasound was utilized to evaluate blood flow to the ovaries. COMPARISON:  CTA abdomen and pelvis earlier today FINDINGS: Uterus Measurements: 6.3 x 2.2 x 3.6 cm. Hypoechoic lesion with internal blood flow in the fundal region on the left measuring 2.3 x 1.6 x 2.5 cm. Endometrium Poorly visualized. Heterogeneous appearance at the level of the cervix which may reflect nabothian cysts. Right ovary Not visualized due to bowel gas. Left ovary Not clearly identified separate from the below mass. There is a 6.0 x 3.4 x 6.8 cm complex solid and cystic mass with echogenic foci/calcification and internal arterial and venous blood flow in the left adnexa/cul-de-sac. Other findings Small amount of free fluid in the adnexa bilaterally. IMPRESSION: 1. 6.8 cm complex left adnexal mass. This may reflect a pedunculated subserosal fibroid given its appearance on multiple prior CT examinations, however an ovarian neoplasm is not excluded. Further evaluation with nonurgent pelvic MRI is recommended. 2. Possible 2.5 cm fibroid in the uterine fundus. 3. Small volume pelvic free fluid. Electronically Signed   By: Logan Bores  M.D.   On: 12/30/2015 16:26   Dg Abd Acute W/chest  Result Date: 12/30/2015 CLINICAL DATA:  Abdominal pain for 1 day EXAM: DG ABDOMEN ACUTE W/ 1V CHEST COMPARISON:  None. FINDINGS: Cardiac shadow is within normal limits. Mild aortic calcifications are seen. The lungs are clear bilaterally. Scattered large and small bowel gas is noted. An aortic stent graft is seen in satisfactory position. No free air is noted. No abnormal mass or abnormal calcifications are noted. Calcified uterine fibroids are seen. IMPRESSION: Chronic changes without acute abnormality. Electronically Signed   By: Inez Catalina M.D.   On: 12/30/2015 09:24    Ct Angio Abd/pel W And/or Wo Contrast  Result Date: 12/30/2015 CLINICAL DATA:  82 year old female with a history of left lower quadrant pain. History of abdominal aneurysm with prior repair. EXAM: CTA ABDOMEN AND PELVIS WITH CONTRAST TECHNIQUE: Multidetector CT imaging of the abdomen and pelvis was performed using the standard protocol during bolus administration of intravenous contrast. Multiplanar reconstructed images and MIPs were obtained and reviewed to evaluate the vascular anatomy. CONTRAST:  100 cc Isovue 370 COMPARISON:  07/30/2015 FINDINGS: Lower chest: Unremarkable appearance of the soft tissues of the chest wall. Heart size within normal limits.  No pericardial fluid/thickening. Dense calcifications in the distribution of the right coronary artery. No lower mediastinal adenopathy. Unremarkable appearance of the distal esophagus. No hiatal hernia. No confluent airspace disease, pleural fluid, or pneumothorax within visualized lung. Vascular: Mixed calcified and soft plaque of the distal thoracic aorta. No periaortic fluid. Abdomen/pelvis: VASCULAR Aorta: Atherosclerotic calcifications of the lower thoracic and the abdominal aorta. Patient is status post endovascular repair of abdominal aortic aneurysm. The proximal margin of the graft terminates just below the renal arteries, without evidence of migration. No evidence of type 1 a endoleak. The left and the right iliac limb are patent. The right limb terminates in the right external iliac artery, with occlusion of the hypogastric artery origin. The left limb terminates within the distal common iliac artery, with patency of the left hypogastric artery maintained. Greatest diameter of the excluded aneurysm sac approximately 3.1 cm, which is unchanged from the comparison CT 07/30/2015. No evidence of interval growth. No periaortic fluid. No evidence of type 2 endoleak. Celiac: Calcifications present at the origin of the celiac artery. Angulated origin,  with at least 50% stenosis at the celiac artery origin. Associated post stenotic dilation. SMA: Mixed calcified plaque and soft plaque at the origin of the superior mesenteric artery, with perhaps 50% stenosis at the origin. Distal vessels are patent. Right renal: Calcified plaque at the origin of the right renal artery, which opacifies with in normal limits. Left renal: Calcified plaque at the origin of the left renal artery. Left renal artery opacifies symmetric to the right. IMA: Inferior mesenteric artery occluded at the origin. The left colic artery and the superior hemorrhoidal artery are patent via collateral flow. Right: Right external iliac artery demonstrates atherosclerotic changes though is patent after the right iliac limb terminates. Surgical changes in the right common femoral artery region, likely from prior cut down. Plaque at the common femoral artery without occlusion or significant stenosis. Proximal right SFA and profunda femoris remain patent. Left: Left external iliac artery patent beyond the limb with calcified and soft plaque. No significant stenosis. Left common femoral artery with mixed atherosclerotic plaque without occlusion. Proximal left SFA and profunda femoris patent. Surgical changes of prior left common femoral artery cutdown. Review of the MIP images confirms the above findings. NON-VASCULAR Hepatobiliary:  Unremarkable appearance of liver. No intrahepatic biliary ductal dilatation. Unremarkable gallbladder. Pancreas: Unremarkable appearance of the pancreas. Spleen: Unremarkable appearance of the spleen Adrenals/Urinary Tract: Unremarkable appearance of the bilateral adrenal glands. No hydronephrosis or nephrolithiasis. Bilateral hypodense lesions are too small to characterize. Unremarkable course of the bilateral ureters. Unremarkable appearance of the urinary bladder, partially distended. Stomach/Bowel: Hiatal hernia. No abnormal distention of small bowel or colon. Normal  appendix. Colonic diverticula. No associated inflammatory changes. Lymphatic: No lymphadenopathy. Reproductive: Irregular contour of the posterior uterus, similar to prior and compatible with fibroid changes. Questionable cystic changes of the bilateral at adnexa. On the right, cyst measures 2.8 cm. Musculoskeletal: No displaced fracture. Degenerative changes of the spine. No significant bony canal narrowing. Degenerative changes the bilateral hips. IMPRESSION: Vascular No acute vascular abnormality. Patient is status post endovascular repair of abdominal aortic aneurysm, without evidence of endoleak or stent migration. Aortic atherosclerosis, with atherosclerotic changes at the origin of the mesenteric vessels. There is at least 50% stenosis at the origin of the celiac artery and superior mesenteric artery. Given that the inferior mesenteric artery has been excluded by the aneurysm repair, chronic mesenteric ischemia is a possibility as an etiology of the patient's gastrointestinal complaints. Correlation with patient presentation and history as well as mesenteric duplex may be useful. Atherosclerotic changes at the origin the bilateral renal arteries, which remain patent. Non Vascular Hiatal hernia. Diverticular disease without evidence of acute diverticulitis. Fibroid uterus. Right adnexal/ovarian cyst. Recommend referral for Ob GYN evaluation and correlation with pelvic ultrasound, potentially to initiate surveillance given the patient's age. Signed, Dulcy Fanny. Earleen Newport, DO [facility] Electronically Signed   By: Corrie Mckusick D.O.   On: 12/30/2015 11:38    EKG:   Orders placed or performed during the hospital encounter of 12/30/15  . ED EKG  . ED EKG   Sinus rhythm 61 bpm no ST-T changes.  IMPRESSION AND PLAN:  #1 intractable abdominal pain and left lower quadrant. Negative CT studies likely secondary to chronic mesenteric ischemia. Keep  in  observation status, IV fluids, IV pain medication, bowel rest and see how she does. #2 left adnexal mass, likely due to pedunculated  fibroid but unable to exclude ovarian neoplasm. Will consult OB/GYN. #3 leukocytosis likely due to chronic mesenteric ischemia and stress-induced leukocytosis. Hold off on antibiotics at this time. #4GI and DVT prophylaxis   All the records are reviewed and case discussed with ED provider. Management plans discussed with the patient, family and they are in agreement.  CODE STATUS: full code  TOTAL TIME TAKING CARE OF THIS PATIENT: 55 minutes.    Epifanio Lesches M.D on 12/30/2015 at 4:53 PM  Between 7am to 6pm - Pager - [phone]  After 6pm go to [email or web address] - password [email or web address] [facility]  Office  [phone]  CC: Primary care physician; Viviana Simpler, MD  Note: This dictation was prepared with Dragon dictation along with smaller phrase technology. Any transcriptional errors that result from this process are unintentional.

## 2015-12-30 NOTE — ED Provider Notes (Addendum)
[facility] Emergency Department Provider Note ____________________________________________   I have reviewed the triage vital signs and the triage nursing note.  HISTORY  Chief Complaint Abdominal Pain   Historian Patient  HPI Tiffany Martinez is a 82 y.o. female here with severe lower and left lower abdominal pain which started gradually but has become worse and worse since last evening around 5 or 6 PM. She states that she has had nausea without vomiting. No diarrhea. No black or bloody stools. She states that she's had an episode like this in the past when she was evaluated and "nothing was found. "She states this time is more severe. She has waves of sharp stabbing pain which are worse than the underlying moderate to severe pains at baseline over the past several hours.  She has a history of AAA with stent,hypertension, CLL, melanoma, and squamous cell skin cancer.  She denies fevers or chills. She denies dysuria or hematuria or flank pain.  Nothing seems to make this pain worse or better.    Past Medical History:  Diagnosis Date  . AAA (abdominal aortic aneurysm) ([place]) 2012   3.9cm, rec rpt 1 yr  . Anemia   . Anxiety   . Chronic venous insufficiency   . CLL (chronic lymphocytic leukemia) ([place]) 2014   Stage 0--by flow cytometry  . Hx of colonoscopy   . Hyperlipidemia   . Hypertension   . Melanoma ([place])    Right Arm  . PAD (peripheral artery disease) ([place]) 2012   severe R common iliac stenosis, ABI 0.73, mid L common iliac stenosis ABI 1.1  . PAD (peripheral artery disease) [facility])     Patient Active Problem List   Diagnosis Date Noted  . LLQ abdominal pain 08/27/2014  . Advanced directives, counseling/discussion 01/15/2014  . AAA (abdominal aortic aneurysm) ([place])   . Routine general medical examination at a health care facility 07/07/2012  . History of melanoma 12/24/2011  . Atherosclerosis of native arteries of the extremities with  intermittent claudication 05/03/2011  . Peripheral arterial disease ([place]) 12/21/2010  . History of squamous cell carcinoma of skin 09/28/2010  . Chronic anemia 04/02/2010  . VENOUS INSUFFICIENCY, CHRONIC 04/02/2010  . HYPERLIPIDEMIA 06/23/2009  . Episodic mood disorder ([place]) 06/23/2009  . Essential hypertension, benign 06/23/2009    Past Surgical History:  Procedure Laterality Date  . ABDOMINAL AORTIC ANEURYSM REPAIR  02/16/12   [facility]--Dr Sammuel Hines  . CARDIOVASCULAR STRESS TEST  2008   negative  . DOBUTAMINE STRESS ECHO  11/11   negative  . KNEE ARTHROSCOPY  1998   right  . MELANOMA EXCISION  11/09/10   wide excision right upper arm  . TONSILLECTOMY AND ADENOIDECTOMY  1960    Prior to Admission medications   Medication Sig Start Date End Date Taking? Authorizing Provider  aspirin 81 MG tablet Take 81 mg by mouth daily.      Historical Provider, MD  atorvastatin (LIPITOR) 20 MG tablet TAKE 1 TABLET DAILY Patient taking differently: TAKE 1 TABLET every other day 10/16/14   Venia Carbon, MD  nitroGLYCERIN (NITROSTAT) 0.4 MG SL tablet Place 0.4 mg under the tongue every 5 (five) minutes as needed.      Historical Provider, MD  triamterene-hydrochlorothiazide (DYAZIDE) 37.5-25 MG per capsule TAKE 1 CAPSULE DAILY Patient taking differently: TAKE 1 CAPSULE every other day 01/14/15   Venia Carbon, MD    No Known Allergies  Family History  Problem Relation Age of Onset  . Alzheimer's disease Mother   .  Pancreatic cancer Father   . Coronary artery disease Neg Hx   . Diabetes Neg Hx   . Cancer Neg Hx     breast or colon    Social History Social History  Substance Use Topics  . Smoking status: Current Every Day Smoker    Types: Cigarettes  . Smokeless tobacco: Never Used     Comment: pt states that she has 1-2 cigarettes per day with dinner  . Alcohol use 0.0 oz/week     Comment: regular wine with dinner    Review of Systems  Constitutional: Negative for fever. Eyes:  Negative for visual changes. ENT: Negative for sore throat. Cardiovascular: Negative for chest pain. Respiratory: Negative for shortness of breath. Gastrointestinal: Positive for left lower quadrant abdominal pain as per history of present illness. Genitourinary: Negative for dysuria. Musculoskeletal: Negative for back pain. Skin: Negative for rash. Neurological: Negative for headache. 10 point Review of Systems otherwise negative ____________________________________________   PHYSICAL EXAM:  VITAL SIGNS: ED Triage Vitals [12/30/15 0839]  Enc Vitals Group     BP (!) 153/50     Pulse Rate (!) 56     Resp 18     Temp 97.2 F (36.2 C)     Temp Source Oral     SpO2 96 %     Weight 160 lb (72.6 kg)     Height 5\' 4"  (1.626 m)     Head Circumference      Peak Flow      Pain Score 10     Pain Loc      Pain Edu?      Excl. in [place]?      Constitutional: Alert and oriented. Called up in the stretcher, closing her eyes due to severe pain in her abdomen. HEENT   Head: Normocephalic and atraumatic.      Eyes: Conjunctivae are normal. PERRL. Normal extraocular movements.      Ears:         Nose: No congestion/rhinnorhea.   Mouth/Throat: Mucous membranes are moist.   Neck: No stridor. Cardiovascular/Chest: Normal rate, regular rhythm.  No murmurs, rubs, or gallops. Respiratory: Normal respiratory effort without tachypnea nor retractions. Breath sounds are clear and equal bilaterally. No wheezes/rales/rhonchi. Gastrointestinal: Soft. But severe tenderness lower greater than upper with some rebound tenderness in the lower abdomen.  Genitourinary/rectal:Deferred Musculoskeletal: Nontender with normal range of motion in all extremities. No joint effusions.  No lower extremity tenderness.  No edema. Neurologic:  Normal speech and language. No gross or focal neurologic deficits are appreciated. Skin:  Skin is warm, dry and intact. No rash noted. Psychiatric: Mood and affect are  normal. Speech and behavior are normal. Patient exhibits appropriate insight and judgment.  ____________________________________________   EKG I, Lisa Roca, MD, the attending physician have personally viewed and interpreted all ECGs.  61 bpm. Narrow QRS. Normal axis. Normal sinus rhythm. Nonspecific ST and T-wave ____________________________________________  LABS (pertinent positives/negatives)  Labs Reviewed  COMPREHENSIVE METABOLIC PANEL - Abnormal; Notable for the following:       Result Value   Glucose, Bld 142 (*)    BUN 23 (*)    Creatinine, Ser 1.04 (*)    Total Bilirubin 0.1 (*)    GFR calc non Af Amer 50 (*)    GFR calc Af Amer 58 (*)    All other components within normal limits  CBC WITH DIFFERENTIAL/PLATELET - Abnormal; Notable for the following:    WBC 20.7 (*)    RBC  3.13 (*)    Hemoglobin 11.2 (*)    HCT 32.3 (*)    MCV 103.0 (*)    MCH 35.9 (*)    RDW 15.9 (*)    Platelets 114 (*)    Neutro Abs 6.6 (*)    Lymphs Abs 13.7 (*)    All other components within normal limits  URINALYSIS COMPLETEWITH MICROSCOPIC ([facility] ONLY) - Abnormal; Notable for the following:    Color, Urine YELLOW (*)    APPearance CLEAR (*)    Specific Gravity, Urine 1.032 (*)    Squamous Epithelial / LPF 0-5 (*)    All other components within normal limits  LIPASE, BLOOD  TROPONIN I  LACTIC ACID, PLASMA  LACTIC ACID, PLASMA    ____________________________________________  RADIOLOGY All Xrays were viewed by me. Imaging interpreted by Radiologist.   Abdomen and cxr:    IMPRESSION: Chronic changes without acute abnormality.   CT abd and pelvis angio:  IMPRESSION: Vascular  No acute vascular abnormality.  Patient is status post endovascular repair of abdominal aortic aneurysm, without evidence of endoleak or stent migration.  Aortic atherosclerosis, with atherosclerotic changes at the origin of the mesenteric vessels. There is at least 50% stenosis at the origin of  the celiac artery and superior mesenteric artery. Given that the inferior mesenteric artery has been excluded by the aneurysm repair, chronic mesenteric ischemia is a possibility as an etiology of the patient's gastrointestinal complaints. Correlation with patient presentation and history as well as mesenteric duplex may be useful.  Atherosclerotic changes at the origin the bilateral renal arteries, which remain patent.  Non Vascular  Hiatal hernia.  Diverticular disease without evidence of acute diverticulitis.  Fibroid uterus.  Right adnexal/ovarian cyst. Recommend referral for Ob GYN evaluation and correlation with pelvic ultrasound, potentially to initiate surveillance given the patient's age.    __________________________________________  PROCEDURES  Procedure(s) performed: None  Critical Care performed: CRITICAL CARE Performed by: Lisa Roca   Total critical care time: 30 minutes  Critical care time was exclusive of separately billable procedures and treating other patients.  Critical care was necessary to treat or prevent imminent or life-threatening deterioration.  Critical care was time spent personally by me on the following activities: development of treatment plan with patient and/or surrogate as well as nursing, discussions with consultants, evaluation of patient's response to treatment, examination of patient, obtaining history from patient or surrogate, ordering and performing treatments and interventions, ordering and review of laboratory studies, ordering and review of radiographic studies, pulse oximetry and re-evaluation of patient's condition.   ____________________________________________   ED COURSE / ASSESSMENT AND PLAN  Pertinent labs & imaging results that were available during my care of the patient were reviewed by me and considered in my medical decision making (see chart for details).  This patient arrived in the significant  abdominal pain, including some rebound.  No reported vomiting or diarrhea. Of note, she has had episode like this before, with no certain cause identified, although this episode does seem to be more severe.  Given the fact that she reported an episode like this before, I chose to treat patient symptomatically, and workup the patient, did not feel like she needed emergency surgical consultation before workup.  After multiple doses of IV Dilaudid, patient had a significant improvement in pain although not completely resolved.  White blood count significantly elevated at 20,000. She has had elevated white blood count past, but this is higher.  No evidence of UTI.  CT scan showed  no vascular emergency, no evidence of colitis, diverticulosis, or ischemic colitis, or other emergency cause to explain her severe abdominal pain.  Recommended nonemergency ultrasound to evaluate right ovarian cyst.  On reexamination of the patient around 1 PM, she still having some intermittent sharp stabbing pains in her lower abdomen. I'm somewhat suspicious that her symptoms may be due to something intestinal including Crohn's disease or ulcerative colitis, and that she may actually at some point in the future need GI evaluation and colonoscopy.  Her CT scan did show multiple intestinal vessels with 50% atherosclerosis, and physical exam could indicate pain out of proportion to exam, however lactate is normal, and CT result was normal.  I left a message with nurse for Dr. Franchot Gallo (Vascular) to evaluate patient - may be in hospital.  I spoke with Dr. Vianne Bulls for admission.  CONSULTATIONS:   Dr. Vianne Bulls, hospitalist for admission.  Dr. Ronalee Belts, vascular surgery, left message with nurse for consult.   Patient / Family / Caregiver informed of clinical course, medical decision-making process, and agree with plan.     ___________________________________________   FINAL CLINICAL IMPRESSION(S) / ED  DIAGNOSES   Final diagnoses:  Lower abdominal pain              Note: This dictation was prepared with Dragon dictation. Any transcriptional errors that result from this process are unintentional    Lisa Roca, MD 12/30/15 [place], MD 12/30/15 [phone]

## 2015-12-30 NOTE — ED Notes (Signed)
Report given to Amy, RN.

## 2015-12-30 NOTE — ED Notes (Signed)
Pt informed that

## 2015-12-30 NOTE — ED Notes (Addendum)
Pt updated on results. Stats she does not have to urinate at this time

## 2015-12-31 DIAGNOSIS — N949 Unspecified condition associated with female genital organs and menstrual cycle: Secondary | ICD-10-CM | POA: Diagnosis not present

## 2015-12-31 DIAGNOSIS — I10 Essential (primary) hypertension: Secondary | ICD-10-CM | POA: Diagnosis not present

## 2015-12-31 DIAGNOSIS — R109 Unspecified abdominal pain: Secondary | ICD-10-CM | POA: Diagnosis not present

## 2015-12-31 DIAGNOSIS — D259 Leiomyoma of uterus, unspecified: Secondary | ICD-10-CM | POA: Diagnosis not present

## 2015-12-31 DIAGNOSIS — K551 Chronic vascular disorders of intestine: Secondary | ICD-10-CM | POA: Diagnosis not present

## 2015-12-31 LAB — CBC
HEMATOCRIT: 29.8 % — AB (ref 35.0–47.0)
HEMOGLOBIN: 10 g/dL — AB (ref 12.0–16.0)
MCH: 34.5 pg — ABNORMAL HIGH (ref 26.0–34.0)
MCHC: 33.7 g/dL (ref 32.0–36.0)
MCV: 102.4 fL — ABNORMAL HIGH (ref 80.0–100.0)
Platelets: 109 10*3/uL — ABNORMAL LOW (ref 150–440)
RBC: 2.91 MIL/uL — ABNORMAL LOW (ref 3.80–5.20)
RDW: 16.1 % — AB (ref 11.5–14.5)
WBC: 19.9 10*3/uL — AB (ref 3.6–11.0)

## 2015-12-31 LAB — BASIC METABOLIC PANEL
ANION GAP: 9 (ref 5–15)
BUN: 16 mg/dL (ref 6–20)
CALCIUM: 9.2 mg/dL (ref 8.9–10.3)
CO2: 22 mmol/L (ref 22–32)
Chloride: 107 mmol/L (ref 101–111)
Creatinine, Ser: 1.1 mg/dL — ABNORMAL HIGH (ref 0.44–1.00)
GFR calc Af Amer: 54 mL/min — ABNORMAL LOW (ref >60)
GFR, EST NON AFRICAN AMERICAN: 46 mL/min — AB (ref >60)
GLUCOSE: 105 mg/dL — AB (ref 65–99)
POTASSIUM: 3.5 mmol/L (ref 3.5–5.1)
SODIUM: 138 mmol/L (ref 135–145)

## 2015-12-31 LAB — GLUCOSE, CAPILLARY: GLUCOSE-CAPILLARY: 124 mg/dL — AB (ref 65–99)

## 2015-12-31 MED ORDER — HYDROCODONE-ACETAMINOPHEN 5-325 MG PO TABS: 1.0000 | ORAL_TABLET | ORAL | 0 refills | Status: DC | PRN

## 2015-12-31 MED ORDER — ONDANSETRON HCL 4 MG PO TABS: 4.0000 mg | ORAL_TABLET | Freq: Four times a day (QID) | ORAL | 0 refills | Status: DC | PRN

## 2015-12-31 NOTE — Discharge Summary (Signed)
[facility] NAME: Tiffany Martinez    MR#:  [id number]  DATE OF BIRTH:  19-Nov-1933  DATE OF ADMISSION:  12/30/2015 ADMITTING PHYSICIAN: Epifanio Lesches, MD  DATE OF DISCHARGE: ,12/31/2015  PRIMARY CARE PHYSICIAN: Viviana Simpler, MD    ADMISSION DIAGNOSIS:  Lower abdominal pain [R10.30]  DISCHARGE DIAGNOSIS:  Active Problems:   Abdominal pain   SECONDARY DIAGNOSIS:   Past Medical History:  Diagnosis Date  . AAA (abdominal aortic aneurysm) ([place]) 2012   3.9cm, rec rpt 1 yr  . Anemia   . Anxiety   . Chronic venous insufficiency   . CLL (chronic lymphocytic leukemia) ([place]) 2014   Stage 0--by flow cytometry  . Hx of colonoscopy   . Hyperlipidemia   . Hypertension   . Melanoma ([place])    Right Arm  . PAD (peripheral artery disease) ([place]) 2012   severe R common iliac stenosis, ABI 0.73, mid L common iliac stenosis ABI 1.1  . PAD (peripheral artery disease) [facility])     HOSPITAL COURSE:   82 year old female with past medical history of hypertension, hyperlipidemia, history of abdominal aortic aneurysm, anxiety, peripheral vascular disease who presented to the hospital due to abdominal pain, nausea and vomiting.  1. Abdominal pain-patient presented with left lower quadrant abdominal pain which was sharp in nature. She underwent a CT scan of the abdomen pelvis and also pelvic ultrasound which showed a left adnexal complex mass. -A GYN consult was obtained and patient was seen by Dr. Kenton Kingfisher. He recommended checking a CA-125 which has been done but the results are pending. -This could either be a pedunculated fibroid or possible ovarian malignancy but this can be worked up as an outpatient. -Patient was discharged on some oral pain meds and follow-up with GYN as an outpatient.  2. Essential hypertension-patient will resume her triamterene HCTZ.  3. Hyperlipidemia-patient resume her atorvastatin.  4. Leukocytosis - etiology  unclear. ?? Stress mediated.  No acute infectious source noted. Afebrile.  Can be further followed as outpatient.   DISCHARGE CONDITIONS:   Stable  CONSULTS OBTAINED:  Treatment Team:  Cammie Sickle, MD Gae Dry, MD  DRUG ALLERGIES:  No Known Allergies  DISCHARGE MEDICATIONS:     Medication List    TAKE these medications   aspirin 81 MG tablet Take 81 mg by mouth daily.   atorvastatin 20 MG tablet Commonly known as:  LIPITOR Take 20 mg by mouth daily.   HYDROcodone-acetaminophen 5-325 MG tablet Commonly known as:  NORCO/VICODIN Take 1-2 tablets by mouth every 4 (four) hours as needed for moderate pain.   nitroGLYCERIN 0.4 MG SL tablet Commonly known as:  NITROSTAT Place 0.4 mg under the tongue every 5 (five) minutes as needed.   ondansetron 4 MG tablet Commonly known as:  ZOFRAN Take 1 tablet (4 mg total) by mouth every 6 (six) hours as needed for nausea.   triamterene-hydrochlorothiazide 37.5-25 MG tablet Commonly known as:  MAXZIDE-25 Take 1 tablet by mouth daily.         DISCHARGE INSTRUCTIONS:   DIET:  Cardiac diet  DISCHARGE CONDITION:  Stable  ACTIVITY:  Activity as tolerated  OXYGEN:  Home Oxygen: No.   Oxygen Delivery: room air  DISCHARGE LOCATION:  home   If you experience worsening of your admission symptoms, develop shortness of breath, life threatening emergency, suicidal or homicidal thoughts you must seek medical attention immediately by calling 911 or calling your MD immediately  if symptoms  less severe.  You Must read complete instructions/literature along with all the possible adverse reactions/side effects for all the Medicines you take and that have been prescribed to you. Take any new Medicines after you have completely understood and accpet all the possible adverse reactions/side effects.   Please note  You were cared for by a hospitalist during your hospital stay. If you have any questions about your  discharge medications or the care you received while you were in the hospital after you are discharged, you can call the unit and asked to speak with the hospitalist on call if the hospitalist that took care of you is not available. Once you are discharged, your primary care physician will handle any further medical issues. Please note that NO REFILLS for any discharge medications will be authorized once you are discharged, as it is imperative that you return to your primary care physician (or establish a relationship with a primary care physician if you do not have one) for your aftercare needs so that they can reassess your need for medications and monitor your lab values.     Today   Mild left lower quadrant abdominal pain but no nausea or vomiting. Afebrile. No other complaints presently.  VITAL SIGNS:  Blood pressure (!) 157/49, pulse (!) 58, temperature 98.1 F (36.7 C), temperature source Oral, resp. rate 20, height 5\' 4"  (1.626 m), weight 76.2 kg (168 lb), SpO2 97 %.  I/O:   Intake/Output Summary (Last 24 hours) at 12/31/15 1500 Last data filed at 12/31/15 1300  Gross per 24 hour  Intake              240 ml  Output                0 ml  Net              240 ml    PHYSICAL EXAMINATION:  GENERAL:  82 y.o.-year-old patient lying in the bed with no acute distress.  EYES: Pupils equal, round, reactive to light and accommodation. No scleral icterus. Extraocular muscles intact.  HEENT: Head atraumatic, normocephalic. Oropharynx and nasopharynx clear.  NECK:  Supple, no jugular venous distention. No thyroid enlargement, no tenderness.  LUNGS: Normal breath sounds bilaterally, no wheezing, rales,rhonchi. No use of accessory muscles of respiration.  CARDIOVASCULAR: S1, S2 normal. No murmurs, rubs, or gallops.  ABDOMEN: Soft, Tender in LLQ, but no rebound, rigidity, non-distended. Bowel sounds present. No organomegaly or mass.  EXTREMITIES: No pedal edema, cyanosis, or clubbing.   NEUROLOGIC: Cranial nerves II through XII are intact. No focal motor or sensory defecits b/l.  PSYCHIATRIC: The patient is alert and oriented x 3. Good affect.  SKIN: No obvious rash, lesion, or ulcer.   DATA REVIEW:   CBC  Recent Labs Lab 12/31/15 0357  WBC 19.9*  HGB 10.0*  HCT 29.8*  PLT 109*    Chemistries   Recent Labs Lab 12/30/15 0840 12/31/15 0357  NA 138 138  K 3.6 3.5  CL 108 107  CO2 24 22  GLUCOSE 142* 105*  BUN 23* 16  CREATININE 1.04* 1.10*  CALCIUM 9.3 9.2  AST 21  --   ALT 18  --   ALKPHOS 70  --   BILITOT 0.1*  --     Cardiac Enzymes  Recent Labs Lab 12/30/15 0840  TROPONINI <0.03    Microbiology Results  No results found for this or any previous visit.  RADIOLOGY:  US Transvaginal Non-ob  Result Date: 12/30/2015  CLINICAL DATA:  Lower abdominal pain for 2 days, left worse than right. Intermittent pain since 06/2015. EXAM: TRANSABDOMINAL AND TRANSVAGINAL ULTRASOUND OF PELVIS DOPPLER ULTRASOUND OF OVARIES TECHNIQUE: Both transabdominal and transvaginal ultrasound examinations of the pelvis were performed. Transabdominal technique was performed for global imaging of the pelvis including uterus, ovaries, adnexal regions, and pelvic cul-de-sac. It was necessary to proceed with endovaginal exam following the transabdominal exam to visualize the uterus, endometrium, and ovaries. Color and duplex Doppler ultrasound was utilized to evaluate blood flow to the ovaries. COMPARISON:  CTA abdomen and pelvis earlier today FINDINGS: Uterus Measurements: 6.3 x 2.2 x 3.6 cm. Hypoechoic lesion with internal blood flow in the fundal region on the left measuring 2.3 x 1.6 x 2.5 cm. Endometrium Poorly visualized. Heterogeneous appearance at the level of the cervix which may reflect nabothian cysts. Right ovary Not visualized due to bowel gas. Left ovary Not clearly identified separate from the below mass. There is a 6.0 x 3.4 x 6.8 cm complex solid and cystic mass with  echogenic foci/calcification and internal arterial and venous blood flow in the left adnexa/cul-de-sac. Other findings Small amount of free fluid in the adnexa bilaterally. IMPRESSION: 1. 6.8 cm complex left adnexal mass. This may reflect a pedunculated subserosal fibroid given its appearance on multiple prior CT examinations, however an ovarian neoplasm is not excluded. Further evaluation with nonurgent pelvic MRI is recommended. 2. Possible 2.5 cm fibroid in the uterine fundus. 3. Small volume pelvic free fluid. Electronically Signed   By: Logan Bores M.D.   On: 12/30/2015 16:26   US Pelvis Complete  Result Date: 12/30/2015 CLINICAL DATA:  Lower abdominal pain for 2 days, left worse than right. Intermittent pain since 06/2015. EXAM: TRANSABDOMINAL AND TRANSVAGINAL ULTRASOUND OF PELVIS DOPPLER ULTRASOUND OF OVARIES TECHNIQUE: Both transabdominal and transvaginal ultrasound examinations of the pelvis were performed. Transabdominal technique was performed for global imaging of the pelvis including uterus, ovaries, adnexal regions, and pelvic cul-de-sac. It was necessary to proceed with endovaginal exam following the transabdominal exam to visualize the uterus, endometrium, and ovaries. Color and duplex Doppler ultrasound was utilized to evaluate blood flow to the ovaries. COMPARISON:  CTA abdomen and pelvis earlier today FINDINGS: Uterus Measurements: 6.3 x 2.2 x 3.6 cm. Hypoechoic lesion with internal blood flow in the fundal region on the left measuring 2.3 x 1.6 x 2.5 cm. Endometrium Poorly visualized. Heterogeneous appearance at the level of the cervix which may reflect nabothian cysts. Right ovary Not visualized due to bowel gas. Left ovary Not clearly identified separate from the below mass. There is a 6.0 x 3.4 x 6.8 cm complex solid and cystic mass with echogenic foci/calcification and internal arterial and venous blood flow in the left adnexa/cul-de-sac. Other findings Small amount of free fluid in the  adnexa bilaterally. IMPRESSION: 1. 6.8 cm complex left adnexal mass. This may reflect a pedunculated subserosal fibroid given its appearance on multiple prior CT examinations, however an ovarian neoplasm is not excluded. Further evaluation with nonurgent pelvic MRI is recommended. 2. Possible 2.5 cm fibroid in the uterine fundus. 3. Small volume pelvic free fluid. Electronically Signed   By: Logan Bores M.D.   On: 12/30/2015 16:26   [place] Art/ven Flow Abd Pelv Doppler  Result Date: 12/30/2015 CLINICAL DATA:  Lower abdominal pain for 2 days, left worse than right. Intermittent pain since 06/2015. EXAM: TRANSABDOMINAL AND TRANSVAGINAL ULTRASOUND OF PELVIS DOPPLER ULTRASOUND OF OVARIES TECHNIQUE: Both transabdominal and transvaginal ultrasound examinations of the pelvis were performed. Transabdominal  technique was performed for global imaging of the pelvis including uterus, ovaries, adnexal regions, and pelvic cul-de-sac. It was necessary to proceed with endovaginal exam following the transabdominal exam to visualize the uterus, endometrium, and ovaries. Color and duplex Doppler ultrasound was utilized to evaluate blood flow to the ovaries. COMPARISON:  CTA abdomen and pelvis earlier today FINDINGS: Uterus Measurements: 6.3 x 2.2 x 3.6 cm. Hypoechoic lesion with internal blood flow in the fundal region on the left measuring 2.3 x 1.6 x 2.5 cm. Endometrium Poorly visualized. Heterogeneous appearance at the level of the cervix which may reflect nabothian cysts. Right ovary Not visualized due to bowel gas. Left ovary Not clearly identified separate from the below mass. There is a 6.0 x 3.4 x 6.8 cm complex solid and cystic mass with echogenic foci/calcification and internal arterial and venous blood flow in the left adnexa/cul-de-sac. Other findings Small amount of free fluid in the adnexa bilaterally. IMPRESSION: 1. 6.8 cm complex left adnexal mass. This may reflect a pedunculated subserosal fibroid given its appearance  on multiple prior CT examinations, however an ovarian neoplasm is not excluded. Further evaluation with nonurgent pelvic MRI is recommended. 2. Possible 2.5 cm fibroid in the uterine fundus. 3. Small volume pelvic free fluid. Electronically Signed   By: Logan Bores M.D.   On: 12/30/2015 16:26   Dg Abd Acute W/chest  Result Date: 12/30/2015 CLINICAL DATA:  Abdominal pain for 1 day EXAM: DG ABDOMEN ACUTE W/ 1V CHEST COMPARISON:  None. FINDINGS: Cardiac shadow is within normal limits. Mild aortic calcifications are seen. The lungs are clear bilaterally. Scattered large and small bowel gas is noted. An aortic stent graft is seen in satisfactory position. No free air is noted. No abnormal mass or abnormal calcifications are noted. Calcified uterine fibroids are seen. IMPRESSION: Chronic changes without acute abnormality. Electronically Signed   By: Inez Catalina M.D.   On: 12/30/2015 09:24   Ct Angio Abd/pel W And/or Wo Contrast  Result Date: 12/30/2015 CLINICAL DATA:  82 year old female with a history of left lower quadrant pain. History of abdominal aneurysm with prior repair. EXAM: CTA ABDOMEN AND PELVIS WITH CONTRAST TECHNIQUE: Multidetector CT imaging of the abdomen and pelvis was performed using the standard protocol during bolus administration of intravenous contrast. Multiplanar reconstructed images and MIPs were obtained and reviewed to evaluate the vascular anatomy. CONTRAST:  100 cc Isovue 370 COMPARISON:  07/30/2015 FINDINGS: Lower chest: Unremarkable appearance of the soft tissues of the chest wall. Heart size within normal limits.  No pericardial fluid/thickening. Dense calcifications in the distribution of the right coronary artery. No lower mediastinal adenopathy. Unremarkable appearance of the distal esophagus. No hiatal hernia. No confluent airspace disease, pleural fluid, or pneumothorax within visualized lung. Vascular: Mixed calcified and soft plaque of the distal thoracic aorta. No  periaortic fluid. Abdomen/pelvis: VASCULAR Aorta: Atherosclerotic calcifications of the lower thoracic and the abdominal aorta. Patient is status post endovascular repair of abdominal aortic aneurysm. The proximal margin of the graft terminates just below the renal arteries, without evidence of migration. No evidence of type 1 a endoleak. The left and the right iliac limb are patent. The right limb terminates in the right external iliac artery, with occlusion of the hypogastric artery origin. The left limb terminates within the distal common iliac artery, with patency of the left hypogastric artery maintained. Greatest diameter of the excluded aneurysm sac approximately 3.1 cm, which is unchanged from the comparison CT 07/30/2015. No evidence of interval growth. No periaortic fluid. No  evidence of type 2 endoleak. Celiac: Calcifications present at the origin of the celiac artery. Angulated origin, with at least 50% stenosis at the celiac artery origin. Associated post stenotic dilation. SMA: Mixed calcified plaque and soft plaque at the origin of the superior mesenteric artery, with perhaps 50% stenosis at the origin. Distal vessels are patent. Right renal: Calcified plaque at the origin of the right renal artery, which opacifies with in normal limits. Left renal: Calcified plaque at the origin of the left renal artery. Left renal artery opacifies symmetric to the right. IMA: Inferior mesenteric artery occluded at the origin. The left colic artery and the superior hemorrhoidal artery are patent via collateral flow. Right: Right external iliac artery demonstrates atherosclerotic changes though is patent after the right iliac limb terminates. Surgical changes in the right common femoral artery region, likely from prior cut down. Plaque at the common femoral artery without occlusion or significant stenosis. Proximal right SFA and profunda femoris remain patent. Left: Left external iliac artery patent beyond the limb  with calcified and soft plaque. No significant stenosis. Left common femoral artery with mixed atherosclerotic plaque without occlusion. Proximal left SFA and profunda femoris patent. Surgical changes of prior left common femoral artery cutdown. Review of the MIP images confirms the above findings. NON-VASCULAR Hepatobiliary: Unremarkable appearance of liver. No intrahepatic biliary ductal dilatation. Unremarkable gallbladder. Pancreas: Unremarkable appearance of the pancreas. Spleen: Unremarkable appearance of the spleen Adrenals/Urinary Tract: Unremarkable appearance of the bilateral adrenal glands. No hydronephrosis or nephrolithiasis. Bilateral hypodense lesions are too small to characterize. Unremarkable course of the bilateral ureters. Unremarkable appearance of the urinary bladder, partially distended. Stomach/Bowel: Hiatal hernia. No abnormal distention of small bowel or colon. Normal appendix. Colonic diverticula. No associated inflammatory changes. Lymphatic: No lymphadenopathy. Reproductive: Irregular contour of the posterior uterus, similar to prior and compatible with fibroid changes. Questionable cystic changes of the bilateral at adnexa. On the right, cyst measures 2.8 cm. Musculoskeletal: No displaced fracture. Degenerative changes of the spine. No significant bony canal narrowing. Degenerative changes the bilateral hips. IMPRESSION: Vascular No acute vascular abnormality. Patient is status post endovascular repair of abdominal aortic aneurysm, without evidence of endoleak or stent migration. Aortic atherosclerosis, with atherosclerotic changes at the origin of the mesenteric vessels. There is at least 50% stenosis at the origin of the celiac artery and superior mesenteric artery. Given that the inferior mesenteric artery has been excluded by the aneurysm repair, chronic mesenteric ischemia is a possibility as an etiology of the patient's gastrointestinal complaints. Correlation with patient  presentation and history as well as mesenteric duplex may be useful. Atherosclerotic changes at the origin the bilateral renal arteries, which remain patent. Non Vascular Hiatal hernia. Diverticular disease without evidence of acute diverticulitis. Fibroid uterus. Right adnexal/ovarian cyst. Recommend referral for Ob GYN evaluation and correlation with pelvic ultrasound, potentially to initiate surveillance given the patient's age. Signed, Dulcy Fanny. Earleen Newport, DO [facility] Electronically Signed   By: Corrie Mckusick D.O.   On: 12/30/2015 11:38      Management plans discussed with the patient, family and they are in agreement.  CODE STATUS:     Code Status Orders        Start     Ordered   12/30/15 1810  Do not attempt resuscitation (DNR)  Continuous    Question Answer Comment  In the event of cardiac or respiratory ARREST Do not call a "code blue"   In the event of cardiac or respiratory  ARREST Do not perform Intubation, CPR, defibrillation or ACLS   In the event of cardiac or respiratory ARREST Use medication by any route, position, wound care, and other measures to relive pain and suffering. May use oxygen, suction and manual treatment of airway obstruction as needed for comfort.      12/30/15 1810    Code Status History    Date Active Date Inactive Code Status Order ID Comments User Context   12/30/2015  4:08 PM 12/30/2015  6:10 PM Full Code [id number]  Epifanio Lesches, MD ED    Advance Directive Documentation   Flowsheet Row Most Recent Value  Type of Advance Directive  Healthcare Power of Attorney, Living will, Out of facility DNR (pink MOST or yellow form)  Pre-existing out of facility DNR order (yellow form or pink MOST form)  Yellow form placed in chart (order not valid for inpatient use)  "MOST" Form in Place?  No data      TOTAL TIME TAKING CARE OF THIS PATIENT: 40 minutes.    Henreitta Leber M.D on 12/31/2015 at  3:00 PM  Between 7am to 6pm - Pager - [phone]  After 6pm go to [email or web address] - password [email or web address] [facility]  Office  [phone]  CC: Primary care physician; Viviana Simpler, MD

## 2015-12-31 NOTE — Care Management (Signed)
Admitted to [facility] with the diagnosis of abdominal pain under observation status. Lives alone at [place] x 8-9 years. Contact person is Docia Furl [phone] or [phone]). Last seen Dr. Silvio Pate in May-June. No home health. No skilled facility. No home oxygen. Uses no aids for ambulation. Takes care of all basic and instrumental activities of daily living herself, drives. No falls. Good appetite. Neighbor will transport.  Shelbie Ammons RN MSN CCM Care Management [phone]

## 2015-12-31 NOTE — Consult Note (Signed)
Obstetrics & Gynecology Consultation Note  Date of Consultation: 12/31/2015   Requesting Provider:  Dr Verdell Carmine Primary OBGYN: None Primary Care Provider: Viviana Simpler  Reason for Consultation: Pelvic Mass  History of Present Illness: Tiffany Martinez is a 82 y.o. Go with the above CC. Pt has h/o sharp shooting LLQ pain one day ago; prior episode months ago was mild, related to GI cause.  Yesterday though the pain worsened to severe and she came to ER.  No n/v/f/c/diarrhea/VB.  No prior GYN surgeries.  No known fibroids.  ROS: A 12-point review of systems was performed and negative, except as stated in the above HPI.  OBGYN History: As per HPI. OB History    None     Menarche age 11 no history of abnormal pap smears (last pap smear: years ago, which was normal.) no history of STIs.   She is currently using nothing for contraception.  no HRT use.    Past Medical History: Past Medical History:  Diagnosis Date  . AAA (abdominal aortic aneurysm) ([place]) 2012   3.9cm, rec rpt 1 yr  . Anemia   . Anxiety   . Chronic venous insufficiency   . CLL (chronic lymphocytic leukemia) ([place]) 2014   Stage 0--by flow cytometry  . Hx of colonoscopy   . Hyperlipidemia   . Hypertension   . Melanoma ([place])    Right Arm  . PAD (peripheral artery disease) ([place]) 2012   severe R common iliac stenosis, ABI 0.73, mid L common iliac stenosis ABI 1.1  . PAD (peripheral artery disease) ([place])     Past Surgical History: Past Surgical History:  Procedure Laterality Date  . ABDOMINAL AORTIC ANEURYSM REPAIR  02/16/12   [facility]--Dr Sammuel Hines  . CARDIOVASCULAR STRESS TEST  2008   negative  . DOBUTAMINE STRESS ECHO  11/11   negative  . KNEE ARTHROSCOPY  1998   right  . MELANOMA EXCISION  11/09/10   wide excision right upper arm  . TONSILLECTOMY AND ADENOIDECTOMY  1960    Family History:  Family History  Problem Relation Age of Onset  . Alzheimer's disease Mother   . Pancreatic cancer Father   . Coronary  artery disease Neg Hx   . Diabetes Neg Hx   . Cancer Neg Hx     breast or colon   She denies any female cancers, bleeding or blood clotting disorders.   Social History:  Social History   Social History  . Marital status: Widowed    Spouse name: N/A  . Number of children: 4  . Years of education: N/A   Occupational History  . retired- Therapist, sports,     Social History Main Topics  . Smoking status: Current Every Day Smoker    Types: Cigarettes  . Smokeless tobacco: Never Used     Comment: pt states that she has 1-2 cigarettes per day with dinner  . Alcohol use 0.0 oz/week     Comment: regular wine with dinner  . Drug use: No  . Sexual activity: No   Other Topics Concern  . Not on file   Social History Narrative   Widowed 2/11 -2nd for him , 1st for her. 4 step sons. 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   Requests DNR--papers done   Requests no feeding tube  Health Maintenance:  Allergy: No Known Allergies  Current Outpatient Medications: Prescriptions Prior to Admission  Medication Sig Dispense Refill Last Dose  . aspirin 81 MG tablet Take 81 mg by mouth daily.     unknown at unknown  . atorvastatin (LIPITOR) 20 MG tablet Take 20 mg by mouth daily.   unknown at unknown  . nitroGLYCERIN (NITROSTAT) 0.4 MG SL tablet Place 0.4 mg under the tongue every 5 (five) minutes as needed.     prn at prn  . triamterene-hydrochlorothiazide (MAXZIDE-25) 37.5-25 MG tablet Take 1 tablet by mouth daily.   unknown at unknown     Hospital Medications: Current Facility-Administered Medications  Medication Dose Route Frequency Provider Last Rate Last Dose  . 0.9 %  sodium chloride infusion   Intravenous Continuous Epifanio Lesches, MD 50 mL/hr at 12/30/15 1843    . acetaminophen (TYLENOL) tablet 650 mg  650 mg Oral Q6H PRN Epifanio Lesches, MD       Or  .  acetaminophen (TYLENOL) suppository 650 mg  650 mg Rectal Q6H PRN Epifanio Lesches, MD      . aspirin EC tablet 81 mg  81 mg Oral Daily Epifanio Lesches, MD   81 mg at 12/30/15 1921  . atorvastatin (LIPITOR) tablet 20 mg  20 mg Oral Daily Epifanio Lesches, MD   20 mg at 12/30/15 1937  . bisacodyl (DULCOLAX) EC tablet 5 mg  5 mg Oral Daily PRN Epifanio Lesches, MD      . docusate sodium (COLACE) capsule 100 mg  100 mg Oral BID Epifanio Lesches, MD   100 mg at 12/31/15 0925  . famotidine (PEPCID) IVPB 20 mg premix  20 mg Intravenous Q24H Epifanio Lesches, MD   20 mg at 12/30/15 1843  . heparin injection 5,000 Units  5,000 Units Subcutaneous Q8H Epifanio Lesches, MD   5,000 Units at 12/31/15 0459  . HYDROcodone-acetaminophen (NORCO/VICODIN) 5-325 MG per tablet 1-2 tablet  1-2 tablet Oral Q4H PRN Epifanio Lesches, MD   1 tablet at 12/31/15 0458  . ondansetron (ZOFRAN) tablet 4 mg  4 mg Oral Q6H PRN Epifanio Lesches, MD       Or  . ondansetron (ZOFRAN) injection 4 mg  4 mg Intravenous Q6H PRN Epifanio Lesches, MD      . traZODone (DESYREL) tablet 25 mg  25 mg Oral QHS PRN Epifanio Lesches, MD      . triamterene-hydrochlorothiazide (MAXZIDE-25) 37.5-25 MG per tablet 1 tablet  1 tablet Oral Daily Epifanio Lesches, MD   1 tablet at 12/30/15 1921     Physical Exam: Vitals:   12/30/15 1727 12/30/15 2103 12/31/15 0416 12/31/15 0500  BP: (!) 153/47 (!) 169/48 (!) 157/49   Pulse: (!) 58 (!) 56 (!) 58   Resp: 16 20    Temp: 97.6 F (36.4 C) 98 F (36.7 C) 98.1 F (36.7 C)   TempSrc: Oral Oral Oral   SpO2: 100% 95% 97%   Weight: 169 lb 3.2 oz (76.7 kg)   168 lb (76.2 kg)  Height: 5\' 4"  (1.626 m)       Temp:  [97.6 F (36.4 C)-98.1 F (36.7 C)] 98.1 F (36.7 C) (08/16 0416) Pulse Rate:  [40-58] 58 (08/16 0416) Resp:  [9-20] 20 (08/15 2103) BP: (136-169)/(47-72) 157/49 (08/16 0416) SpO2:  [89 %-100 %] 97 % (08/16 0416) Weight:  [168 lb (76.2 kg)-169 lb 3.2  oz (76.7 kg)] 168 lb (76.2 kg) (08/16 0500) No intake/output data recorded. No intake/output data recorded. No intake or  output data in the 24 hours ending 12/31/15 1203   Current Vital Signs 24h Vital Sign Ranges  T 98.1 F (36.7 C) Temp  Avg: 97.9 F (36.6 C)  Min: 97.6 F (36.4 C)  Max: 98.1 F (36.7 C)  BP (!) 157/49 BP  Min: 136/72  Max: 169/48  HR (!) 58 Pulse  Avg: 52.3  Min: 40  Max: 58  RR 20 Resp  Avg: 15.7  Min: 9  Max: 20  SaO2 97 % Not Delivered SpO2  Avg: 96.4 %  Min: 89 %  Max: 100 %       24 Hour I/O Current Shift I/O  Time Ins Outs No intake/output data recorded. No intake/output data recorded.   Patient Vitals for the past 8 hrs:  BP Temp Temp src Pulse SpO2 Weight  12/31/15 0500 - - - - - 168 lb (76.2 kg)  12/31/15 0416 (!) 157/49 98.1 F (36.7 C) Oral (!) 58 97 % -    Body mass index is 28.84 kg/m. General appearance: Well nourished, well developed female in no acute distress.  Neck:  Supple, normal appearance, and no thyromegaly  Cardiovascular:Regular rate and rhythm.  No murmurs, rubs or gallops. Respiratory:  Clear to auscultation bilateral. Normal respiratory effort Abdomen: positive bowel sounds and no masses, hernias; diffusely non tender to palpation, non distended Neuro/Psych:  Normal mood and affect.  Skin:  Warm and dry.  Lymphatic:  No inguinal lymphadenopathy.   Pelvic exam: deferred  Recent Labs Lab 12/30/15 0840 12/31/15 0357  WBC 20.7* 19.9*  HGB 11.2* 10.0*  HCT 32.3* 29.8*  PLT 114* 109*    Recent Labs Lab 12/30/15 0840 12/31/15 0357  NA 138 138  K 3.6 3.5  CL 108 107  CO2 24 22  BUN 23* 16  CREATININE 1.04* 1.10*  CALCIUM 9.3 9.2  PROT 6.8  --   BILITOT 0.1*  --   ALKPHOS 70  --   ALT 18  --   AST 21  --   GLUCOSE 142* 105*   No results for input(s): APTT, INR, PTT in the last 168 hours.  Invalid input(s): DRHAPTT No results for input(s): ABORH in the last 168 hours.  Imaging:  CLINICAL DATA:  Lower  abdominal pain for 2 days, left worse than right. Intermittent pain since 06/2015.  EXAM: TRANSABDOMINAL AND TRANSVAGINAL ULTRASOUND OF PELVIS  DOPPLER ULTRASOUND OF OVARIES  TECHNIQUE: Both transabdominal and transvaginal ultrasound examinations of the pelvis were performed. Transabdominal technique was performed for global imaging of the pelvis including uterus, ovaries, adnexal regions, and pelvic cul-de-sac.  It was necessary to proceed with endovaginal exam following the transabdominal exam to visualize the uterus, endometrium, and ovaries. Color and duplex Doppler ultrasound was utilized to evaluate blood flow to the ovaries.  COMPARISON:  CTA abdomen and pelvis earlier today  FINDINGS: Uterus  Measurements: 6.3 x 2.2 x 3.6 cm. Hypoechoic lesion with internal blood flow in the fundal region on the left measuring 2.3 x 1.6 x 2.5 cm.  Endometrium  Poorly visualized. Heterogeneous appearance at the level of the cervix which may reflect nabothian cysts.  Right ovary  Not visualized due to bowel gas.  Left ovary  Not clearly identified separate from the below mass.  There is a 6.0 x 3.4 x 6.8 cm complex solid and cystic mass with echogenic foci/calcification and internal arterial and venous blood flow in the left adnexa/cul-de-sac.  Other findings  Small amount of free fluid in the adnexa bilaterally.  IMPRESSION: 1. 6.8 cm complex left adnexal mass. This may reflect a pedunculated subserosal fibroid given its appearance on multiple prior CT examinations, however an ovarian neoplasm is not excluded. Further evaluation with nonurgent pelvic MRI is recommended. 2. Possible 2.5 cm fibroid in the uterine fundus. 3. Small volume pelvic free fluid.   Electronically Signed   By: Logan Bores M.D.   On: 12/30/2015 16:26  Assessment: Tiffany Martinez is a 82 y.o. Go who presented to the ED with complaints of LLQ Pain; findings are consistent with  Left adnexal or fibroid mass.  Torsion a possibility, although pain is minimal and resolved now.  Plan: CA125 to aid in suspicion for ovarian cancer. Treat based on pain or if CA125 is elevated. Plan to see pt in 2 days for follow up. Norco or IBF for pain. Surgery options and risks discussed.  Barnett Applebaum, MD [facility] OBGYN Pager [phone]

## 2015-12-31 NOTE — Progress Notes (Signed)
Pt d/c'ed home to [facility] independent living.  Picked up by [facility] transportation.  Pt has prescriptions for new meds, aware to bring to pharmacy.  Pt aware of f/u appt w/ Dr. Kenton Kingfisher Friday AM and how to reschedule if needed.  Pt's questions answered, no issues upon d/c.

## 2015-12-31 NOTE — Care Management Obs Status (Signed)
[place] NOTIFICATION   Patient Details  Name: Tiffany Martinez MRN: [id number] Date of Birth: May 04, 1934   Medicare Observation Status Notification Given:  Yes    Shelbie Ammons, RN 12/31/2015, 9:44 AM

## 2016-01-01 ENCOUNTER — Telehealth: Payer: Self-pay | Admitting: Internal Medicine

## 2016-01-01 LAB — CA 125: CA 125: 13.4 U/mL (ref 0.0–38.1)

## 2016-01-01 NOTE — Telephone Encounter (Signed)
Pt returned your call.  

## 2016-01-14 DIAGNOSIS — D259 Leiomyoma of uterus, unspecified: Secondary | ICD-10-CM | POA: Diagnosis not present

## 2016-01-15 ENCOUNTER — Ambulatory Visit (INDEPENDENT_AMBULATORY_CARE_PROVIDER_SITE_OTHER): Payer: Medicare Other | Admitting: Internal Medicine

## 2016-01-15 ENCOUNTER — Encounter: Payer: Self-pay | Admitting: Internal Medicine

## 2016-01-15 DIAGNOSIS — R1032 Left lower quadrant pain: Secondary | ICD-10-CM

## 2016-01-15 NOTE — Patient Instructions (Signed)
Please call Dr Kenton Kingfisher and get the referral to see the specialist gynecologist at [facility].

## 2016-01-15 NOTE — Assessment & Plan Note (Signed)
Unclear diagnosis Not sure why she had sudden throbbing pain --and was doubled up and vomiting She had fibroids seen in March--mass was bigger (?hemorrhage into the fibroid) Doesn't explain the pain in March--when it was much smaller  Could be vascular insufficiency (ischemic colitis)--though not classic for that Did discuss the advantage of stopping all cigarettes--just in case  It may be worth seeing gyn in [facility] to discuss alternatives including the surgery ??get MRI.  I would defer any imaging decisions to specialist

## 2016-01-15 NOTE — Progress Notes (Signed)
Subjective:    Patient ID: Tiffany Martinez, female    DOB: [birth year], 82 y.o.   MRN: [id number]  HPI Here for hospital follow up Did have previous spell of LLQ abdominal pain--same spot This was severe and "stabbing" Reviewed the hospital records and radiology imaging CA-125 was normal  Saw gyn Dr Kenton Kingfisher yesterday He thinks the pelvic mass is the cause of pain Not excited about doing anything about it though  No blood in stools No pain when moving bowels Takes raisin bran regularly  Will smoke a cigarette once a week or so Doesn't think it was related to the pain  Current Outpatient Prescriptions on File Prior to Visit  Medication Sig Dispense Refill  . aspirin 81 MG tablet Take 81 mg by mouth daily.      Marland Kitchen atorvastatin (LIPITOR) 20 MG tablet Take 20 mg by mouth daily.    . nitroGLYCERIN (NITROSTAT) 0.4 MG SL tablet Place 0.4 mg under the tongue every 5 (five) minutes as needed.      . triamterene-hydrochlorothiazide (MAXZIDE-25) 37.5-25 MG tablet Take 1 tablet by mouth daily.    Marland Kitchen HYDROcodone-acetaminophen (NORCO/VICODIN) 5-325 MG tablet Take 1-2 tablets by mouth every 4 (four) hours as needed for moderate pain. (Patient not taking: Reported on 01/15/2016) 30 tablet 0  . ondansetron (ZOFRAN) 4 MG tablet Take 1 tablet (4 mg total) by mouth every 6 (six) hours as needed for nausea. (Patient not taking: Reported on 01/15/2016) 20 tablet 0   No current facility-administered medications on file prior to visit.     No Known Allergies  Past Medical History:  Diagnosis Date  . AAA (abdominal aortic aneurysm) ([place]) 2012   3.9cm, rec rpt 1 yr  . Anemia   . Anxiety   . Chronic venous insufficiency   . CLL (chronic lymphocytic leukemia) ([place]) 2014   Stage 0--by flow cytometry  . Hx of colonoscopy   . Hyperlipidemia   . Hypertension   . Melanoma ([place])    Right Arm  . PAD (peripheral artery disease) ([place]) 2012   severe R common iliac stenosis, ABI 0.73, mid L common iliac  stenosis ABI 1.1  . PAD (peripheral artery disease) ([place])     Past Surgical History:  Procedure Laterality Date  . ABDOMINAL AORTIC ANEURYSM REPAIR  02/16/12   [facility]--Dr Sammuel Hines  . CARDIOVASCULAR STRESS TEST  2008   negative  . DOBUTAMINE STRESS ECHO  11/11   negative  . KNEE ARTHROSCOPY  1998   right  . MELANOMA EXCISION  11/09/10   wide excision right upper arm  . TONSILLECTOMY AND ADENOIDECTOMY  1960    Family History  Problem Relation Age of Onset  . Alzheimer's disease Mother   . Pancreatic cancer Father   . Coronary artery disease Neg Hx   . Diabetes Neg Hx   . Cancer Neg Hx     breast or colon    Social History   Social History  . Marital status: Widowed    Spouse name: N/A  . Number of children: 4  . Years of education: N/A   Occupational History  . retired- Therapist, sports,     Social History Main Topics  . Smoking status: Current Some Day Smoker    Types: Cigarettes  . Smokeless tobacco: Never Used     Comment: pt states that she has 1-2 cigarettes per day with dinner  . Alcohol use 0.0 oz/week     Comment: regular wine with dinner  . Drug use:  No  . Sexual activity: No   Other Topics Concern  . Not on file   Social History Narrative   Widowed 2/11 -2nd for him , 1st for her. 4 step sons. 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   Requests DNR--papers done   Requests no feeding tube            Review of Systems Had dysuria and pelvic soreness for about a week after the vaginal ultrasound Feels nauseated over the past few weeks--now appetite is off Weight down 5#     Objective:   Physical Exam  Constitutional: She appears well-developed and well-nourished. No distress.  Neck: Normal range of motion. Neck supple. No thyromegaly present.  Cardiovascular: Normal rate, regular rhythm and normal heart sounds.  Exam reveals no gallop.   No murmur  heard. Pulmonary/Chest: Effort normal and breath sounds normal. No respiratory distress. She has no wheezes. She has no rales.  Abdominal: Soft. Bowel sounds are normal. She exhibits no distension. There is no tenderness. There is no rebound and no guarding.  Musculoskeletal: She exhibits no edema.  Lymphadenopathy:    She has no cervical adenopathy.          Assessment & Plan:

## 2016-01-15 NOTE — Progress Notes (Signed)
Pre visit review using our clinic review tool, if applicable. No additional management support is needed unless otherwise documented below in the visit note. 

## 2016-02-05 DIAGNOSIS — D252 Subserosal leiomyoma of uterus: Secondary | ICD-10-CM | POA: Diagnosis not present

## 2016-02-15 HISTORY — PX: SQUAMOUS CELL CARCINOMA EXCISION: SHX2433

## 2016-02-24 ENCOUNTER — Other Ambulatory Visit: Payer: Self-pay

## 2016-02-24 MED ORDER — ATORVASTATIN CALCIUM 20 MG PO TABS: 20.0000 mg | ORAL_TABLET | Freq: Every day | ORAL | 2 refills | Status: DC

## 2016-02-24 MED ORDER — TRIAMTERENE-HCTZ 37.5-25 MG PO TABS: 1.0000 | ORAL_TABLET | Freq: Every day | ORAL | 2 refills | Status: DC

## 2016-02-24 NOTE — Telephone Encounter (Signed)
Rx sent electronically.  

## 2016-02-25 DIAGNOSIS — D0439 Carcinoma in situ of skin of other parts of face: Secondary | ICD-10-CM | POA: Diagnosis not present

## 2016-03-05 ENCOUNTER — Encounter: Payer: Self-pay | Admitting: Internal Medicine

## 2016-04-15 DIAGNOSIS — Z7902 Long term (current) use of antithrombotics/antiplatelets: Secondary | ICD-10-CM | POA: Diagnosis not present

## 2016-04-15 DIAGNOSIS — I739 Peripheral vascular disease, unspecified: Secondary | ICD-10-CM | POA: Diagnosis not present

## 2016-04-15 DIAGNOSIS — I701 Atherosclerosis of renal artery: Secondary | ICD-10-CM | POA: Diagnosis not present

## 2016-04-15 DIAGNOSIS — Z87891 Personal history of nicotine dependence: Secondary | ICD-10-CM | POA: Diagnosis not present

## 2016-04-15 DIAGNOSIS — I714 Abdominal aortic aneurysm, without rupture: Secondary | ICD-10-CM | POA: Diagnosis not present

## 2016-04-15 DIAGNOSIS — Z7982 Long term (current) use of aspirin: Secondary | ICD-10-CM | POA: Diagnosis not present

## 2016-04-15 DIAGNOSIS — R9341 Abnormal radiologic findings on diagnostic imaging of renal pelvis, ureter, or bladder: Secondary | ICD-10-CM | POA: Diagnosis not present

## 2016-04-15 DIAGNOSIS — I10 Essential (primary) hypertension: Secondary | ICD-10-CM | POA: Diagnosis not present

## 2016-04-15 DIAGNOSIS — E785 Hyperlipidemia, unspecified: Secondary | ICD-10-CM | POA: Diagnosis not present

## 2016-04-15 DIAGNOSIS — I70213 Atherosclerosis of native arteries of extremities with intermittent claudication, bilateral legs: Secondary | ICD-10-CM | POA: Diagnosis not present

## 2016-04-29 DIAGNOSIS — Z87891 Personal history of nicotine dependence: Secondary | ICD-10-CM | POA: Diagnosis not present

## 2016-04-29 DIAGNOSIS — I10 Essential (primary) hypertension: Secondary | ICD-10-CM | POA: Diagnosis not present

## 2016-04-29 DIAGNOSIS — D259 Leiomyoma of uterus, unspecified: Secondary | ICD-10-CM | POA: Diagnosis not present

## 2016-04-29 DIAGNOSIS — I739 Peripheral vascular disease, unspecified: Secondary | ICD-10-CM | POA: Diagnosis not present

## 2016-04-29 DIAGNOSIS — R1900 Intra-abdominal and pelvic swelling, mass and lump, unspecified site: Secondary | ICD-10-CM | POA: Diagnosis not present

## 2016-05-14 DIAGNOSIS — R208 Other disturbances of skin sensation: Secondary | ICD-10-CM | POA: Diagnosis not present

## 2016-05-14 DIAGNOSIS — Z85820 Personal history of malignant melanoma of skin: Secondary | ICD-10-CM | POA: Diagnosis not present

## 2016-05-14 DIAGNOSIS — Z08 Encounter for follow-up examination after completed treatment for malignant neoplasm: Secondary | ICD-10-CM | POA: Diagnosis not present

## 2016-05-14 DIAGNOSIS — D0461 Carcinoma in situ of skin of right upper limb, including shoulder: Secondary | ICD-10-CM | POA: Diagnosis not present

## 2016-05-14 DIAGNOSIS — D485 Neoplasm of uncertain behavior of skin: Secondary | ICD-10-CM | POA: Diagnosis not present

## 2016-05-14 DIAGNOSIS — Z85828 Personal history of other malignant neoplasm of skin: Secondary | ICD-10-CM | POA: Diagnosis not present

## 2016-06-11 DIAGNOSIS — D0461 Carcinoma in situ of skin of right upper limb, including shoulder: Secondary | ICD-10-CM | POA: Diagnosis not present

## 2016-07-05 DIAGNOSIS — D0439 Carcinoma in situ of skin of other parts of face: Secondary | ICD-10-CM | POA: Diagnosis not present

## 2016-08-23 ENCOUNTER — Ambulatory Visit: Payer: Medicare Other

## 2016-08-23 ENCOUNTER — Encounter: Payer: Medicare Other | Admitting: Internal Medicine

## 2016-09-16 ENCOUNTER — Encounter: Payer: Self-pay | Admitting: Internal Medicine

## 2016-09-16 ENCOUNTER — Ambulatory Visit (INDEPENDENT_AMBULATORY_CARE_PROVIDER_SITE_OTHER): Payer: Medicare Other | Admitting: Internal Medicine

## 2016-09-16 VITALS — BP 108/60 | HR 73 | Temp 98.0°F | Ht 65.0 in | Wt 166.0 lb

## 2016-09-16 DIAGNOSIS — Z7189 Other specified counseling: Secondary | ICD-10-CM

## 2016-09-16 DIAGNOSIS — I714 Abdominal aortic aneurysm, without rupture, unspecified: Secondary | ICD-10-CM

## 2016-09-16 DIAGNOSIS — I739 Peripheral vascular disease, unspecified: Secondary | ICD-10-CM

## 2016-09-16 DIAGNOSIS — I10 Essential (primary) hypertension: Secondary | ICD-10-CM

## 2016-09-16 DIAGNOSIS — K559 Vascular disorder of intestine, unspecified: Secondary | ICD-10-CM | POA: Insufficient documentation

## 2016-09-16 DIAGNOSIS — F39 Unspecified mood [affective] disorder: Secondary | ICD-10-CM | POA: Diagnosis not present

## 2016-09-16 DIAGNOSIS — Z23 Encounter for immunization: Secondary | ICD-10-CM

## 2016-09-16 DIAGNOSIS — Z0000 Encounter for general adult medical examination without abnormal findings: Secondary | ICD-10-CM

## 2016-09-16 MED ORDER — TRIAMTERENE-HCTZ 37.5-25 MG PO TABS: 0.5000 | ORAL_TABLET | Freq: Every day | ORAL | 0 refills | Status: DC

## 2016-09-16 NOTE — Progress Notes (Signed)
Subjective:    Patient ID: Tiffany Martinez, female    DOB: 04-22-1934, 83 y.o.   MRN: [id number]  HPI Here for Medicare wellness visit and follow up of chronic health conditions Reviewed form and advanced directives Reviewed other doctors and notes in EPIC Did have Moh's surgery for [facility] on nose in past year Does enjoy 1-2 glasses of wine with dinner Still smokes a couple of cigarettes---doesn't want to stop Not much exercise Vision is fine Doesn't note any trouble with hearing [place] last week in house--- bruised a rib on left. Improving now Independent with instrumental ADLs No major memory issues  Has had follow up for the fibroid at [facility] She agreed that this was not the cause of her severe abdominal pain No recurrent spells Has the hydrocodone just in case Discussed the smoking again  Has kept up with vascular follow up with surgeon AAA still okay Has ongoing claudication though-- if she walks too fast. Better with rest She "is running out steam"---but admits she is not doing working out  Going through tough time Brother in [employer] progressive Parkinson's ( or sounds like MSA) Just had to euthanize her 56 year old cat Has been having depression about all of this She doesn't want any medications  Has checked her blood pressure at times Has come down at home Occasional chest pain---feels like it is indigestion (usually at rest in evening) Never exertional No SOB  Current Outpatient Prescriptions on File Prior to Visit  Medication Sig Dispense Refill  . aspirin 81 MG tablet Take 81 mg by mouth daily.      Marland Kitchen atorvastatin (LIPITOR) 20 MG tablet Take 1 tablet (20 mg total) by mouth daily. 90 tablet 2  . HYDROcodone-acetaminophen (NORCO/VICODIN) 5-325 MG tablet Take 1-2 tablets by mouth every 4 (four) hours as needed for moderate pain. 30 tablet 0  . triamterene-hydrochlorothiazide (MAXZIDE-25) 37.5-25 MG tablet Take 1 tablet by mouth daily. 90 tablet 2   No  current facility-administered medications on file prior to visit.     No Known Allergies  Past Medical History:  Diagnosis Date  . AAA (abdominal aortic aneurysm) ([place]) 2012   3.9cm, rec rpt 1 yr  . Anemia   . Anxiety   . Chronic venous insufficiency   . CLL (chronic lymphocytic leukemia) ([place]) 2014   Stage 0--by flow cytometry  . Hx of colonoscopy   . Hyperlipidemia   . Hypertension   . Melanoma ([place])    Right Arm  . PAD (peripheral artery disease) ([place]) 2012   severe R common iliac stenosis, ABI 0.73, mid L common iliac stenosis ABI 1.1  . PAD (peripheral artery disease) ([place])     Past Surgical History:  Procedure Laterality Date  . ABDOMINAL AORTIC ANEURYSM REPAIR  02/16/12   [facility]--Dr Sammuel Hines  . CARDIOVASCULAR STRESS TEST  2008   negative  . DOBUTAMINE STRESS ECHO  11/11   negative  . KNEE ARTHROSCOPY  1998   right  . MELANOMA EXCISION  11/09/10   wide excision right upper arm  . SQUAMOUS CELL CARCINOMA EXCISION  02/2016  . TONSILLECTOMY AND ADENOIDECTOMY  1960    Family History  Problem Relation Age of Onset  . Alzheimer's disease Mother   . Pancreatic cancer Father   . Parkinson's disease Brother   . Coronary artery disease Neg Hx   . Diabetes Neg Hx   . Cancer Neg Hx     breast or colon    Social History  Social History  . Marital status: Widowed    Spouse name: N/A  . Number of children: 4  . Years of education: N/A   Occupational History  . retired- Therapist, sports,     Social History Main Topics  . Smoking status: Current Some Day Smoker    Types: Cigarettes  . Smokeless tobacco: Never Used     Comment: pt states that she has 1-2 cigarettes per day with dinner  . Alcohol use 0.0 oz/week     Comment: regular wine with dinner  . Drug use: No  . Sexual activity: No   Other Topics Concern  . Not on file   Social History Narrative   Widowed 2/11 -2nd for him , 1st for her. 4 step sons. 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   Requests DNR   Discussed MOST form---but would probably accept IV fluids/antibiotics, etc   Requests no feeding tube            Review of Systems Some pollen symptoms now Appetite is fine Weight is stable Sleeps well Teeth are okay---needs new dentures Wears seat belt Bowels are okay--blueberries and raisins seem to help. No blood Some mild leakage of urine--not bad (doesn't need protection) No joint pains but has some right thumb pain. Cyst on right wrist. Has tightening of her right hand tendons (and ?some cysts along the tendons). No functional loss or pain     Objective:   Physical Exam  Constitutional: She is oriented to person, place, and time. She appears well-nourished. No distress.  HENT:  Mouth/Throat: Oropharynx is clear and moist. No oropharyngeal exudate.  Neck: No thyromegaly present.  Cardiovascular: Normal rate, regular rhythm and normal heart sounds.  Exam reveals no gallop.   No murmur heard. Faint pedal pulses  Pulmonary/Chest: Effort normal and breath sounds normal. No respiratory distress. She has no wheezes. She has no rales.  Abdominal: Soft. There is no tenderness.  Musculoskeletal: She exhibits no edema or tenderness.  Lymphadenopathy:    She has no cervical adenopathy.  Neurological: She is alert and oriented to person, place, and time.  President--- "Dwaine Deter, Bush" [phone] [id number] Recall 2/3  Skin: No rash noted. No erythema.  Psychiatric: She has a normal mood and affect. Her behavior is normal.          Assessment & Plan:

## 2016-09-16 NOTE — Progress Notes (Signed)
Pre visit review using our clinic review tool, if applicable. No additional management support is needed unless otherwise documented below in the visit note. 

## 2016-09-16 NOTE — Assessment & Plan Note (Signed)
Has DNR 

## 2016-09-16 NOTE — Assessment & Plan Note (Signed)
Some grieving and stress with ill brother meds not indicated

## 2016-09-16 NOTE — Assessment & Plan Note (Signed)
Stable claudication Discussed more frequent exercise On statin, asa

## 2016-09-16 NOTE — Assessment & Plan Note (Signed)
Recent recheck Graft is okay Will probably not go back for follow up

## 2016-09-16 NOTE — Assessment & Plan Note (Signed)
BP Readings from Last 3 Encounters:  09/16/16 108/60  01/15/16 124/62  12/31/15 (!) 157/49   BP running low Will cut the diuretic in half

## 2016-09-16 NOTE — Assessment & Plan Note (Signed)
Likely the cause of her severe abdominal pain Not recently Discussed the cigarettes Cut back BP meds to maintain perfusion

## 2016-09-16 NOTE — Addendum Note (Signed)
Addended by: Pilar Grammes on: 09/16/2016 04:59 PM   Modules accepted: Orders

## 2016-09-16 NOTE — Assessment & Plan Note (Signed)
I have personally reviewed the Medicare Annual Wellness questionnaire and have noted 1. The patient's medical and social history 2. Their use of alcohol, tobacco or illicit drugs 3. Their current medications and supplements 4. The patient's functional ability including ADL's, fall risks, home safety risks and hearing or visual             impairment. 5. Diet and physical activities 6. Evidence for depression or mood disorders  The patients weight, height, BMI and visual acuity have been recorded in the chart I have made referrals, counseling and provided education to the patient based review of the above and I have provided the pt with a written personalized care plan for preventive services.  I have provided you with a copy of your personalized plan for preventive services. Please take the time to review along with your updated medication list.  Will give prevnar No cancer screening due to age Discussed stopping the few cigarettes Needs to start exercise program---weight training/balance

## 2016-09-17 ENCOUNTER — Telehealth: Payer: Self-pay | Admitting: Radiology

## 2016-09-17 LAB — CBC WITH DIFFERENTIAL/PLATELET
Basophils Relative: 0.9 % (ref 0.0–3.0)
Eosinophils Relative: 0.6 % (ref 0.0–5.0)
HEMATOCRIT: 32.3 % — AB (ref 36.0–46.0)
HEMOGLOBIN: 11 g/dL — AB (ref 12.0–15.0)
LYMPHS PCT: 75.5 % — AB (ref 12.0–46.0)
MCHC: 34 g/dL (ref 30.0–36.0)
MCV: 106.8 fl — AB (ref 78.0–100.0)
Monocytes Relative: 0.8 % — ABNORMAL LOW (ref 3.0–12.0)
Neutrophils Relative %: 22.2 % — ABNORMAL LOW (ref 43.0–77.0)
Platelets: 183 10*3/uL (ref 150.0–400.0)
RBC: 3.03 Mil/uL — AB (ref 3.87–5.11)
RDW: 16.6 % — ABNORMAL HIGH (ref 11.5–15.5)
WBC: 22.6 10*3/uL (ref 4.0–10.5)

## 2016-09-17 LAB — COMPREHENSIVE METABOLIC PANEL
ALT: 16 U/L (ref 0–35)
AST: 19 U/L (ref 0–37)
Albumin: 4.4 g/dL (ref 3.5–5.2)
Alkaline Phosphatase: 72 U/L (ref 39–117)
BILIRUBIN TOTAL: 0.4 mg/dL (ref 0.2–1.2)
BUN: 33 mg/dL — ABNORMAL HIGH (ref 6–23)
CALCIUM: 10.1 mg/dL (ref 8.4–10.5)
CO2: 27 meq/L (ref 19–32)
CREATININE: 1.51 mg/dL — AB (ref 0.40–1.20)
Chloride: 106 mEq/L (ref 96–112)
GFR: 35.19 mL/min — ABNORMAL LOW (ref >60.00)
GLUCOSE: 99 mg/dL (ref 70–99)
Potassium: 4 mEq/L (ref 3.5–5.1)
Sodium: 139 mEq/L (ref 135–145)
Total Protein: 6.9 g/dL (ref 6.0–8.3)

## 2016-09-17 LAB — LIPID PANEL
CHOL/HDL RATIO: 3
Cholesterol: 136 mg/dL (ref 0–200)
HDL: 43.1 mg/dL (ref >39.00)
LDL CALC: 54 mg/dL (ref 0–99)
NONHDL: 92.87
Triglycerides: 196 mg/dL — ABNORMAL HIGH (ref 0.0–149.0)
VLDL: 39.2 mg/dL (ref 0.0–40.0)

## 2016-09-17 LAB — T4, FREE: Free T4: 0.66 ng/dL (ref 0.60–1.60)

## 2016-09-17 NOTE — Telephone Encounter (Signed)
This is about the same over quite a few years Will await the full report

## 2016-09-17 NOTE — Telephone Encounter (Signed)
[name] called a critical WBC 22.6, results given to Dr Silvio Pate

## 2016-09-20 ENCOUNTER — Encounter: Payer: Self-pay | Admitting: *Deleted

## 2016-11-11 DIAGNOSIS — R208 Other disturbances of skin sensation: Secondary | ICD-10-CM | POA: Diagnosis not present

## 2016-11-11 DIAGNOSIS — D485 Neoplasm of uncertain behavior of skin: Secondary | ICD-10-CM | POA: Diagnosis not present

## 2016-11-11 DIAGNOSIS — Z85820 Personal history of malignant melanoma of skin: Secondary | ICD-10-CM | POA: Diagnosis not present

## 2016-11-11 DIAGNOSIS — L821 Other seborrheic keratosis: Secondary | ICD-10-CM | POA: Diagnosis not present

## 2016-11-11 DIAGNOSIS — Z85828 Personal history of other malignant neoplasm of skin: Secondary | ICD-10-CM | POA: Diagnosis not present

## 2016-11-11 DIAGNOSIS — Z08 Encounter for follow-up examination after completed treatment for malignant neoplasm: Secondary | ICD-10-CM | POA: Diagnosis not present

## 2016-11-11 DIAGNOSIS — D2372 Other benign neoplasm of skin of left lower limb, including hip: Secondary | ICD-10-CM | POA: Diagnosis not present

## 2016-12-08 DIAGNOSIS — C44729 Squamous cell carcinoma of skin of left lower limb, including hip: Secondary | ICD-10-CM | POA: Diagnosis not present

## 2016-12-08 DIAGNOSIS — L905 Scar conditions and fibrosis of skin: Secondary | ICD-10-CM | POA: Diagnosis not present

## 2017-02-07 ENCOUNTER — Other Ambulatory Visit: Payer: Self-pay | Admitting: Internal Medicine

## 2017-03-11 DIAGNOSIS — Z23 Encounter for immunization: Secondary | ICD-10-CM | POA: Diagnosis not present

## 2017-04-27 ENCOUNTER — Telehealth: Payer: Self-pay | Admitting: Internal Medicine

## 2017-04-27 MED ORDER — SULFAMETHOXAZOLE-TRIMETHOPRIM 800-160 MG PO TABS: 1.0000 | ORAL_TABLET | Freq: Two times a day (BID) | ORAL | 0 refills | Status: DC

## 2017-04-27 NOTE — Telephone Encounter (Signed)
Spoke to pt. She thinks she has a UTI. Cannot get out of house due to snow. She said she is having dysuria, urinary frequency with scant urine. Has been going on almost a week. Drinking lots of fluid to flush bladder. She said someone at [facility] will go get her meds if we call something in.

## 2017-04-27 NOTE — Telephone Encounter (Signed)
Please let her know I sent the prescription in for her

## 2017-04-27 NOTE — Telephone Encounter (Signed)
Copied from [place] [id number]. Topic: Quick Communication - See Telephone Encounter >> Apr 27, 2017 10:00 AM Cleaster Corin, NT wrote: CRM for notification. See Telephone encounter for:   04/27/17. Pt. Would like to speak with Dr. Silvio Pate or nurse concerning her health. Please call pt. With questions. Pt. Would like a call back today 04-27-17. Pt. States she has been calling but I made pt. Aware office was closed t=due to weather. Pt can be reached at [phone]

## 2017-04-27 NOTE — Telephone Encounter (Signed)
Spoke to pt. She was very appreciative 

## 2017-04-28 DIAGNOSIS — D2271 Melanocytic nevi of right lower limb, including hip: Secondary | ICD-10-CM | POA: Diagnosis not present

## 2017-04-28 DIAGNOSIS — Z85820 Personal history of malignant melanoma of skin: Secondary | ICD-10-CM | POA: Diagnosis not present

## 2017-04-28 DIAGNOSIS — Z85828 Personal history of other malignant neoplasm of skin: Secondary | ICD-10-CM | POA: Diagnosis not present

## 2017-04-28 DIAGNOSIS — D225 Melanocytic nevi of trunk: Secondary | ICD-10-CM | POA: Diagnosis not present

## 2017-05-31 ENCOUNTER — Telehealth: Payer: Self-pay

## 2017-05-31 DIAGNOSIS — M6740 Ganglion, unspecified site: Secondary | ICD-10-CM

## 2017-05-31 NOTE — Telephone Encounter (Signed)
Copied from [place] [phone]. Topic: Referral - Request >> May 31, 2017  9:34 AM Pricilla Handler wrote: Reason for CRM: Patient called requesting a referral from Dr. Silvio Pate for a hand doctor. Patient has a Ganglion Cyst on her right hand that is causing her pain and trouble. Patient stated that during her last visit, Dr. Silvio Pate stated that he would give her a referral if needed in the future. Patient would like a referral in the [facility]. Please contact patient once you have a chance.        Thank You!!!

## 2017-05-31 NOTE — Telephone Encounter (Signed)
Referral placed.

## 2017-06-01 ENCOUNTER — Other Ambulatory Visit: Payer: Self-pay | Admitting: Internal Medicine

## 2017-06-03 DIAGNOSIS — M67431 Ganglion, right wrist: Secondary | ICD-10-CM | POA: Diagnosis not present

## 2017-06-03 DIAGNOSIS — M720 Palmar fascial fibromatosis [Dupuytren]: Secondary | ICD-10-CM | POA: Diagnosis not present

## 2017-06-03 DIAGNOSIS — M19031 Primary osteoarthritis, right wrist: Secondary | ICD-10-CM | POA: Diagnosis not present

## 2017-07-18 IMAGING — CT CT CTA ABD/PEL W/CM AND/OR W/O CM
2 of 9 series · 9 of 46 positions shown, 14 images · IV contrast (isovue)
Comparison: 07/30/2015

CLINICAL DATA: 81-year-old female with a history of left lower
quadrant pain. History of abdominal aneurysm with prior repair.

EXAM:
CTA ABDOMEN AND PELVIS WITH CONTRAST
TECHNIQUE: Multidetector CT imaging of the abdomen and pelvis was performed
using the standard protocol during bolus administration of
intravenous contrast. Multiplanar reconstructed images and MIPs were
obtained and reviewed to evaluate the vascular anatomy.
CONTRAST:  100 cc Isovue 370

[Series 5: coronals · coronal · 0.61mm/px · 2 of 82 slices shown]
[im 28/82  soft-tissue]
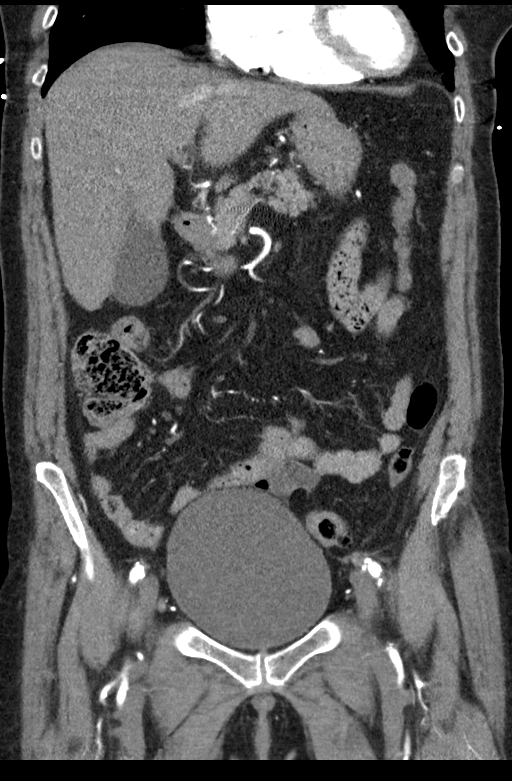
[im 55/82  soft-tissue]
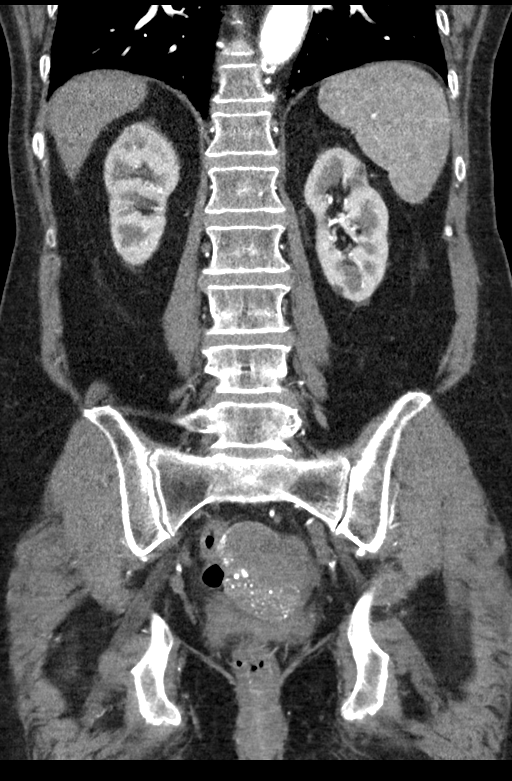

[Series 11: axial venous · axial · portal-venous · 0.67mm/px · z∈[-1178,-838]mm · 7 of 92 slices shown, 12 images]
[im 12/92  soft-tissue]
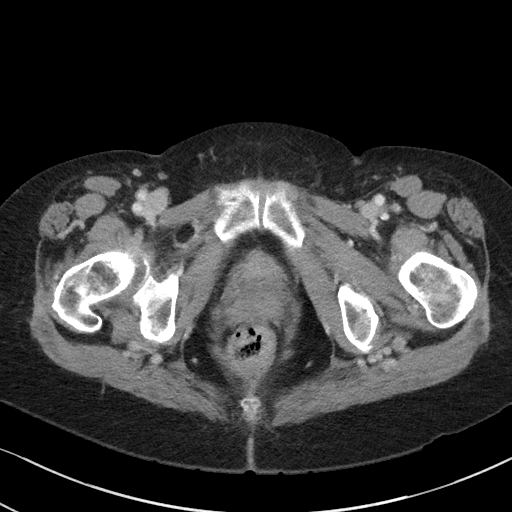
[im 12/92  bone]
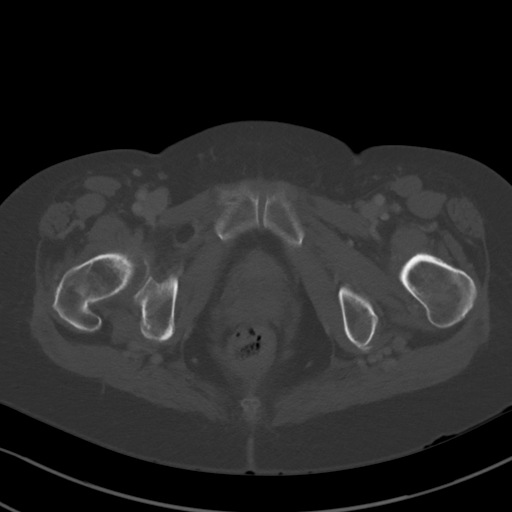
[im 23/92  soft-tissue]
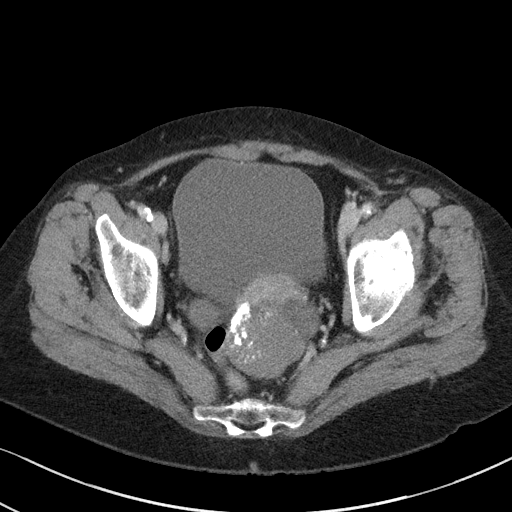
[im 35/92  soft-tissue]
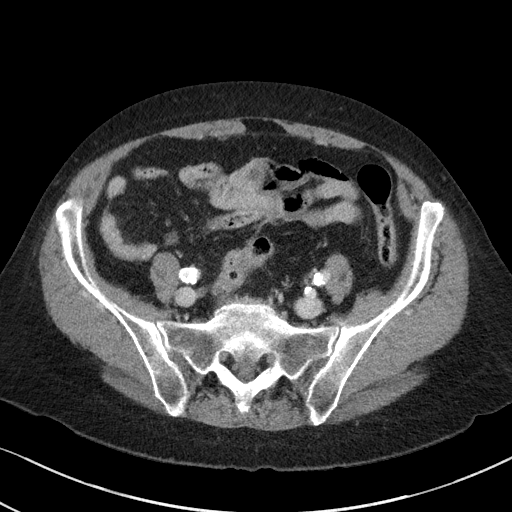
[im 46/92  soft-tissue]
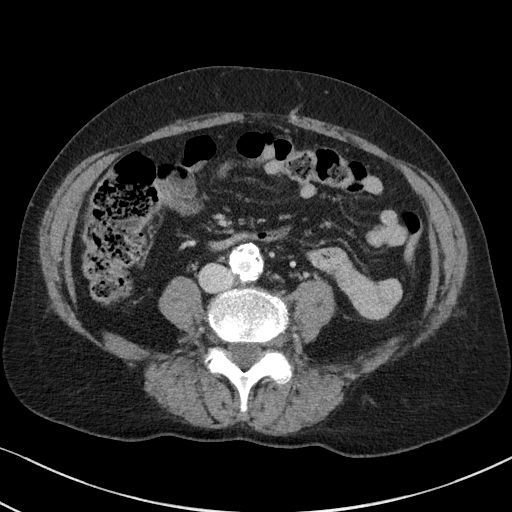
[im 46/92  lung]
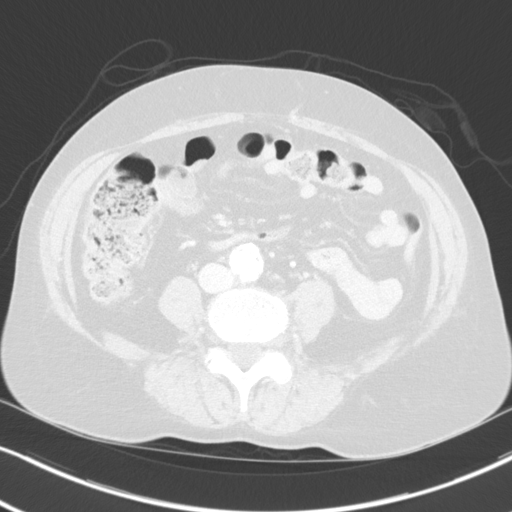
[im 57/92  soft-tissue]
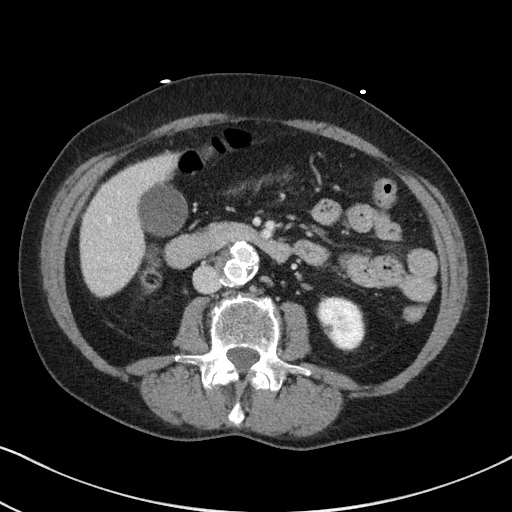
[im 57/92  lung]
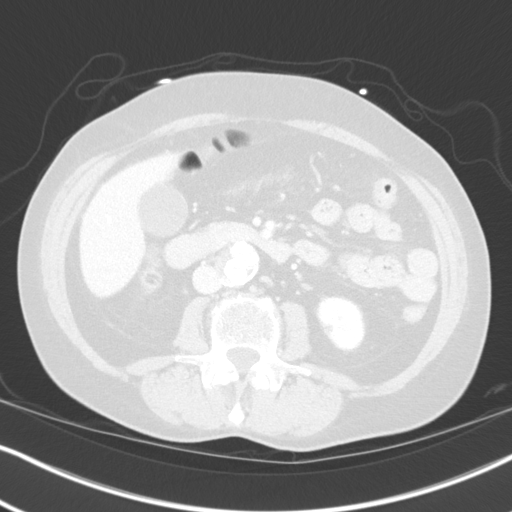
[im 69/92  soft-tissue]
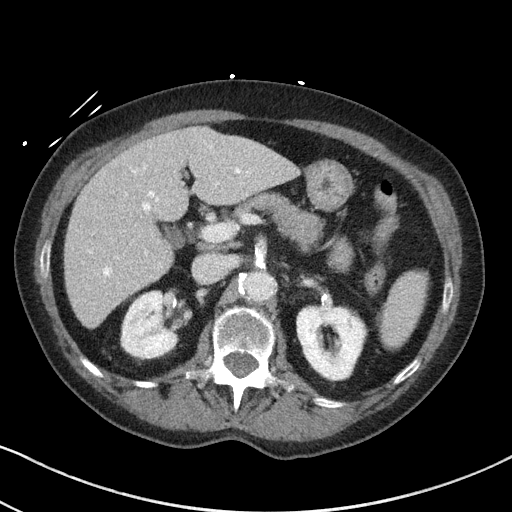
[im 69/92  lung]
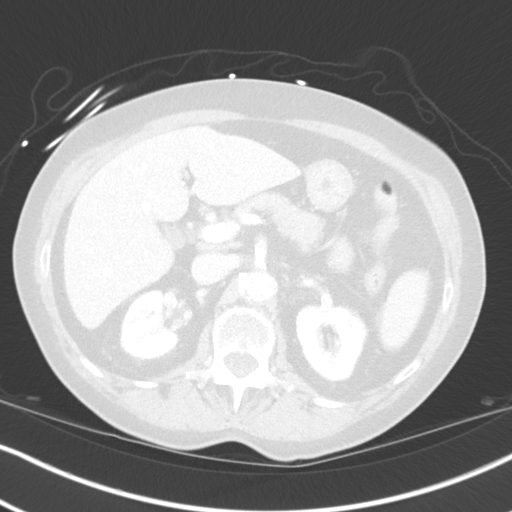
[im 80/92  soft-tissue]
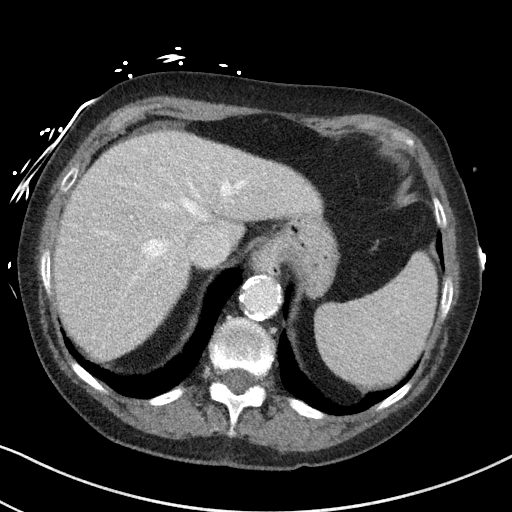
[im 80/92  lung]
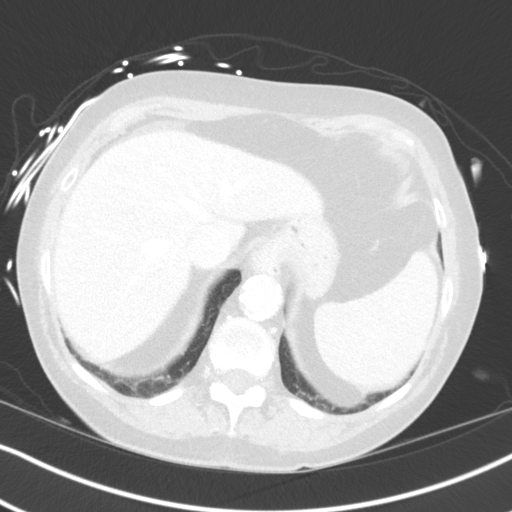

[9 of 46 positions shown; findings below may reference images not displayed]

FINDINGS: Lower chest:

Unremarkable appearance of the soft tissues of the chest wall.

Heart size within normal limits.  No pericardial fluid/thickening.

Dense calcifications in the distribution of the right coronary
artery.

No lower mediastinal adenopathy.

Unremarkable appearance of the distal esophagus.

No hiatal hernia.

No confluent airspace disease, pleural fluid, or pneumothorax within
visualized lung.

Vascular:

Mixed calcified and soft plaque of the distal thoracic aorta. No
periaortic fluid.

Abdomen/pelvis:

VASCULAR

Aorta: Atherosclerotic calcifications of the lower thoracic and the
abdominal aorta.

Patient is status post endovascular repair of abdominal aortic
aneurysm. The proximal margin of the graft terminates just below the
renal arteries, without evidence of migration. No evidence of type 1
a endoleak. The left and the right iliac limb are patent. The right
limb terminates in the right external iliac artery, with occlusion
of the hypogastric artery origin.

The left limb terminates within the distal common iliac artery, with
patency of the left hypogastric artery maintained.

Greatest diameter of the excluded aneurysm sac approximately 3.1 cm,
which is unchanged from the comparison CT 07/30/2015. No evidence of
interval growth. No periaortic fluid.

No evidence of type 2 endoleak.

Celiac: Calcifications present at the origin of the celiac artery.
Angulated origin, with at least 50% stenosis at the celiac artery
origin. Associated post stenotic dilation.

SMA: Mixed calcified plaque and soft plaque at the origin of the
superior mesenteric artery, with perhaps 50% stenosis at the origin.
Distal vessels are patent.

Right renal: Calcified plaque at the origin of the right renal
artery, which opacifies with in normal limits.

Left renal: Calcified plaque at the origin of the left renal artery.
Left renal artery opacifies symmetric to the right.

IMA: Inferior mesenteric artery occluded at the origin. The left
colic artery and the superior hemorrhoidal artery are patent via
collateral flow.

Right:

Right external iliac artery demonstrates atherosclerotic changes
though is patent after the right iliac limb terminates. Surgical
changes in the right common femoral artery region, likely from prior
cut down.

Plaque at the common femoral artery without occlusion or significant
stenosis. Proximal right SFA and profunda femoris remain patent.

Left:

Left external iliac artery patent beyond the limb with calcified and
soft plaque. No significant stenosis. Left common femoral artery
with mixed atherosclerotic plaque without occlusion. Proximal left
SFA and profunda femoris patent. Surgical changes of prior left
common femoral artery cutdown.

Review of the MIP images confirms the above findings.

NON-VASCULAR

Hepatobiliary: Unremarkable appearance of liver. No intrahepatic
biliary ductal dilatation. Unremarkable gallbladder.

Pancreas: Unremarkable appearance of the pancreas.

Spleen: Unremarkable appearance of the spleen

Adrenals/Urinary Tract: Unremarkable appearance of the bilateral
adrenal glands.

No hydronephrosis or nephrolithiasis. Bilateral hypodense lesions
are too small to characterize. Unremarkable course of the bilateral
ureters.

Unremarkable appearance of the urinary bladder, partially distended.

Stomach/Bowel: Hiatal hernia. No abnormal distention of small bowel
or colon. Normal appendix. Colonic diverticula. No associated
inflammatory changes.

Lymphatic: No lymphadenopathy.

Reproductive: Irregular contour of the posterior uterus, similar to
prior and compatible with fibroid changes. Questionable cystic
changes of the bilateral at adnexa. On the right, cyst measures
cm.

Musculoskeletal: No displaced fracture. Degenerative changes of the
spine. No significant bony canal narrowing. Degenerative changes the
bilateral hips.
IMPRESSION: Vascular

No acute vascular abnormality.

Patient is status post endovascular repair of abdominal aortic
aneurysm, without evidence of endoleak or stent migration.

Aortic atherosclerosis, with atherosclerotic changes at the origin
of the mesenteric vessels. There is at least 50% stenosis at the
origin of the celiac artery and superior mesenteric artery. Given
that the inferior mesenteric artery has been excluded by the
aneurysm repair, chronic mesenteric ischemia is a possibility as an
etiology of the patient's gastrointestinal complaints. Correlation
with patient presentation and history as well as mesenteric duplex
may be useful.

Atherosclerotic changes at the origin the bilateral renal arteries,
which remain patent.

Non Vascular

Hiatal hernia.

Diverticular disease without evidence of acute diverticulitis.

Fibroid uterus.

Right adnexal/ovarian cyst. Recommend referral for Ob GYN evaluation
and correlation with pelvic ultrasound, potentially to initiate
surveillance given the patient's age.

## 2017-08-24 ENCOUNTER — Other Ambulatory Visit: Payer: Self-pay

## 2017-08-24 ENCOUNTER — Encounter: Payer: Self-pay | Admitting: Emergency Medicine

## 2017-08-24 ENCOUNTER — Emergency Department
Admission: EM | Admit: 2017-08-24 | Discharge: 2017-08-24 | Disposition: A | Discharge status: Home / Self Care | Payer: Medicare Other | Attending: Student in an Organized Health Care Education/Training Program | Admitting: Student in an Organized Health Care Education/Training Program

## 2017-08-24 ENCOUNTER — Emergency Department: Payer: Medicare Other

## 2017-08-24 DIAGNOSIS — Z7982 Long term (current) use of aspirin: Secondary | ICD-10-CM | POA: Insufficient documentation

## 2017-08-24 DIAGNOSIS — I10 Essential (primary) hypertension: Secondary | ICD-10-CM | POA: Insufficient documentation

## 2017-08-24 DIAGNOSIS — R0602 Shortness of breath: Secondary | ICD-10-CM | POA: Insufficient documentation

## 2017-08-24 DIAGNOSIS — Z85828 Personal history of other malignant neoplasm of skin: Secondary | ICD-10-CM | POA: Diagnosis not present

## 2017-08-24 DIAGNOSIS — F17210 Nicotine dependence, cigarettes, uncomplicated: Secondary | ICD-10-CM | POA: Insufficient documentation

## 2017-08-24 DIAGNOSIS — Z856 Personal history of leukemia: Secondary | ICD-10-CM | POA: Diagnosis not present

## 2017-08-24 LAB — CBC
HEMATOCRIT: 29.8 % — AB (ref 35.0–47.0)
Hemoglobin: 10.4 g/dL — ABNORMAL LOW (ref 12.0–16.0)
MCH: 37.4 pg — ABNORMAL HIGH (ref 26.0–34.0)
MCHC: 34.8 g/dL (ref 32.0–36.0)
MCV: 107.6 fL — AB (ref 80.0–100.0)
PLATELETS: 146 10*3/uL — AB (ref 150–440)
RBC: 2.77 MIL/uL — ABNORMAL LOW (ref 3.80–5.20)
RDW: 15.5 % — ABNORMAL HIGH (ref 11.5–14.5)
WBC: 18.9 10*3/uL — ABNORMAL HIGH (ref 3.6–11.0)

## 2017-08-24 LAB — BASIC METABOLIC PANEL
Anion gap: 7 (ref 5–15)
BUN: 18 mg/dL (ref 6–20)
CHLORIDE: 109 mmol/L (ref 101–111)
CO2: 22 mmol/L (ref 22–32)
CREATININE: 1.06 mg/dL — AB (ref 0.44–1.00)
Calcium: 9.3 mg/dL (ref 8.9–10.3)
GFR calc Af Amer: 56 mL/min — ABNORMAL LOW (ref >60)
GFR calc non Af Amer: 48 mL/min — ABNORMAL LOW (ref >60)
GLUCOSE: 98 mg/dL (ref 65–99)
POTASSIUM: 3.9 mmol/L (ref 3.5–5.1)
Sodium: 138 mmol/L (ref 135–145)

## 2017-08-24 LAB — BRAIN NATRIURETIC PEPTIDE: B Natriuretic Peptide: 200 pg/mL — ABNORMAL HIGH (ref 0.0–100.0)

## 2017-08-24 LAB — TROPONIN I: Troponin I: <0.03 ng/mL (ref <0.03)

## 2017-08-24 MED ORDER — AMLODIPINE BESYLATE 5 MG PO TABS: 5.0000 mg | ORAL_TABLET | Freq: Every day | ORAL | 0 refills | Status: DC

## 2017-08-24 MED ORDER — ALBUTEROL SULFATE (2.5 MG/3ML) 0.083% IN NEBU
5.0000 mg | INHALATION_SOLUTION | Freq: Once | RESPIRATORY_TRACT | Status: AC
  Administered 2017-08-24: 5 mg via RESPIRATORY_TRACT
  Filled 2017-08-24: qty 6

## 2017-08-24 MED ORDER — PREDNISONE 20 MG PO TABS
60.0000 mg | ORAL_TABLET | Freq: Once | ORAL | Status: DC
  Filled 2017-08-24: qty 3

## 2017-08-24 MED ORDER — IPRATROPIUM-ALBUTEROL 0.5-2.5 (3) MG/3ML IN SOLN
3.0000 mL | Freq: Once | RESPIRATORY_TRACT | Status: DC
  Filled 2017-08-24: qty 3

## 2017-08-24 MED ORDER — FUROSEMIDE 40 MG PO TABS: 40.0000 mg | ORAL_TABLET | Freq: Every day | ORAL | 0 refills | Status: DC

## 2017-08-24 MED ORDER — AMLODIPINE BESYLATE 5 MG PO TABS
10.0000 mg | ORAL_TABLET | Freq: Once | ORAL | Status: AC
  Administered 2017-08-24: 10 mg via ORAL
  Filled 2017-08-24: qty 2

## 2017-08-24 NOTE — ED Notes (Signed)
Patient states she is not short of breath and does not need any more medication and that she was sent over her from the urgent care for her blood pressure spiking and when it does she becomes short of breath.

## 2017-08-24 NOTE — ED Triage Notes (Signed)
Sent from [place] urgent care , with increased shortness of breath with activity.

## 2017-08-24 NOTE — ED Triage Notes (Signed)
Patient c/o SOB with exertion X 2-3 weeks. Patient reports she can hardly walk a few steps without becoming extremely winded. Patient denies hx of the same. Patient reports a productive cough with thick clear sputum for the same time period. Patient c/o bilateral lower extremity swelling for the same time period.

## 2017-08-24 NOTE — ED Notes (Signed)
Pt states she feels shakey and trembly and felt this way sitting in the waiting room. Pt walked into room with steady gait. Rn notified

## 2017-08-24 NOTE — ED Provider Notes (Signed)
[facility] Emergency Department Provider Note    None    (approximate)  I have reviewed the triage vital signs and the nursing notes.   HISTORY  Chief Complaint High blood pressure   HPI Tiffany Martinez is a 84 y.o. female with a history of CLL as well as hypertension and a known aneurysm presents to the ER for several weeks of persistently worsening exertional dyspnea as well as lower extremity swelling.  She checked her blood pressure this morning and was elevated to 834 systolic.  She then went to the pharmacy to recheck it to see if the machine was wrong but it was consistent so she was sent to the urgent care clinic who directed her to the ER.  She denies any chest pain.  No shortness of breath at rest.  Patient is able to ambulate up and about the entire ER without any significant dyspnea or shortness of breath.  States that she has not been taking her antihypertensive medication for the past several months because she has found out that she has an allergic reaction to it.   Past Medical History:  Diagnosis Date  . AAA (abdominal aortic aneurysm) ([place]) 2012   3.9cm, rec rpt 1 yr  . Anemia   . Anxiety   . Chronic venous insufficiency   . CLL (chronic lymphocytic leukemia) ([place]) 2014   Stage 0--by flow cytometry  . Hx of colonoscopy   . Hyperlipidemia   . Hypertension   . Melanoma ([place])    Right Arm  . PAD (peripheral artery disease) ([place]) 2012   severe R common iliac stenosis, ABI 0.73, mid L common iliac stenosis ABI 1.1  . PAD (peripheral artery disease) ([facility])    Family History  Problem Relation Age of Onset  . Alzheimer's disease Mother   . Pancreatic cancer Father   . Parkinson's disease Brother   . Coronary artery disease Neg Hx   . Diabetes Neg Hx   . Cancer Neg Hx        breast or colon   Past Surgical History:  Procedure Laterality Date  . ABDOMINAL AORTIC ANEURYSM REPAIR  02/16/12   [facility]--Dr Sammuel Hines  . CARDIOVASCULAR STRESS TEST   2008   negative  . DOBUTAMINE STRESS ECHO  11/11   negative  . KNEE ARTHROSCOPY  1998   right  . MELANOMA EXCISION  11/09/10   wide excision right upper arm  . SQUAMOUS CELL CARCINOMA EXCISION  02/2016  . TONSILLECTOMY AND ADENOIDECTOMY  1960   Patient Active Problem List   Diagnosis Date Noted  . Mesenteric ischemia ([place]) 09/16/2016  . Advanced directives, counseling/discussion 01/15/2014  . AAA (abdominal aortic aneurysm) ([place])   . Routine general medical examination at a health care facility 07/07/2012  . History of melanoma 12/24/2011  . Peripheral arterial disease ([place]) 12/21/2010  . History of squamous cell carcinoma of skin 09/28/2010  . Chronic anemia 04/02/2010  . VENOUS INSUFFICIENCY, CHRONIC 04/02/2010  . HYPERLIPIDEMIA 06/23/2009  . Episodic mood disorder ([place]) 06/23/2009  . Essential hypertension, benign 06/23/2009      Prior to Admission medications   Medication Sig Start Date End Date Taking? Authorizing Provider  amLODipine (NORVASC) 5 MG tablet Take 1 tablet (5 mg total) by mouth daily. 08/24/17 08/24/18  Merlyn Lot, MD  aspirin 81 MG tablet Take 81 mg by mouth daily.      [provider]  atorvastatin (LIPITOR) 20 MG tablet TAKE 1 TABLET DAILY 02/07/17  Venia Carbon, MD  furosemide (LASIX) 40 MG tablet Take 1 tablet (40 mg total) by mouth daily. 08/24/17 08/24/18  Merlyn Lot, MD  HYDROcodone-acetaminophen (NORCO/VICODIN) 5-325 MG tablet Take 1-2 tablets by mouth every 4 (four) hours as needed for moderate pain. 12/31/15   Henreitta Leber, MD  sulfamethoxazole-trimethoprim (BACTRIM DS,SEPTRA DS) 800-160 MG tablet Take 1 tablet by mouth 2 (two) times daily. 04/27/17   Venia Carbon, MD  triamterene-hydrochlorothiazide (MAXZIDE-25) 37.5-25 MG tablet Take 0.5 tablets by mouth daily. 09/16/16   Venia Carbon, MD  triamterene-hydrochlorothiazide (MAXZIDE-25) 37.5-25 MG tablet TAKE 1 TABLET DAILY 06/01/17   Venia Carbon, MD     Allergies Patient has no known allergies.    Social History Social History   Tobacco Use  . Smoking status: Current Some Day Smoker    Types: Cigarettes  . Smokeless tobacco: Never Used  . Tobacco comment: pt states that she has 1-2 cigarettes per day with dinner  Substance Use Topics  . Alcohol use: Yes    Alcohol/week: 0.0 oz    Comment: regular wine with dinner  . Drug use: No    Review of Systems Patient denies headaches, rhinorrhea, blurry vision, numbness, shortness of breath, chest pain, edema, cough, abdominal pain, nausea, vomiting, diarrhea, dysuria, fevers, rashes or hallucinations unless otherwise stated above in HPI. ____________________________________________   PHYSICAL EXAM:  VITAL SIGNS: Vitals:   08/24/17 2132 08/24/17 2133  BP:  (!) 166/66  Pulse: 64 64  Resp:  18  Temp:    SpO2:  99%   Constitutional: Alert and oriented. Well appearing and in no acute distress. Eyes: Conjunctivae are normal.  Head: Atraumatic. Nose: No congestion/rhinnorhea. Mouth/Throat: Mucous membranes are moist.   Neck: No stridor. Painless ROM.  Cardiovascular: Normal rate, regular rhythm. Grossly normal heart sounds.  Good peripheral circulation. Respiratory: Normal respiratory effort.  No retractions. Lungs CTAB. Gastrointestinal: Soft and nontender. No distention. No abdominal bruits. No CVA tenderness. Genitourinary:  Musculoskeletal: No lower extremity tenderness nor edema.  No joint effusions. Neurologic:  Normal speech and language. No gross focal neurologic deficits are appreciated. No facial droop Skin:  Skin is warm, dry and intact. No rash noted. Psychiatric: Mood and affect are normal. Speech and behavior are normal.  ____________________________________________   LABS (all labs ordered are listed, but only abnormal results are displayed)  Results for orders placed or performed during the hospital encounter of 08/24/17 (from the past 24 hour(s))   Basic metabolic panel     Status: Abnormal   Collection Time: 08/24/17  7:23 PM  Result Value Ref Range   Sodium 138 135 - 145 mmol/L   Potassium 3.9 3.5 - 5.1 mmol/L   Chloride 109 101 - 111 mmol/L   CO2 22 22 - 32 mmol/L   Glucose, Bld 98 65 - 99 mg/dL   BUN 18 6 - 20 mg/dL   Creatinine, Ser 1.06 (H) 0.44 - 1.00 mg/dL   Calcium 9.3 8.9 - 10.3 mg/dL   GFR calc non Af Amer 48 (L) >60 mL/min   GFR calc Af Amer 56 (L) >60 mL/min   Anion gap 7 5 - 15  CBC     Status: Abnormal   Collection Time: 08/24/17  7:23 PM  Result Value Ref Range   WBC 18.9 (H) 3.6 - 11.0 K/uL   RBC 2.77 (L) 3.80 - 5.20 MIL/uL   Hemoglobin 10.4 (L) 12.0 - 16.0 g/dL   HCT 29.8 (L) 35.0 - 47.0 %  MCV 107.6 (H) 80.0 - 100.0 fL   MCH 37.4 (H) 26.0 - 34.0 pg   MCHC 34.8 32.0 - 36.0 g/dL   RDW 15.5 (H) 11.5 - 14.5 %   Platelets 146 (L) 150 - 440 K/uL  Troponin I     Status: None   Collection Time: 08/24/17  7:23 PM  Result Value Ref Range   Troponin I <0.03 <0.03 ng/mL  Brain natriuretic peptide     Status: Abnormal   Collection Time: 08/24/17  7:23 PM  Result Value Ref Range   B Natriuretic Peptide 200.0 (H) 0.0 - 100.0 pg/mL   ____________________________________________  EKG My review and personal interpretation at Time: 19:07   Indication: htn  Rate: 70  Rhythm: sinus Axis: normal Other: non specific st abn, no stemi ____________________________________________  RADIOLOGY  I personally reviewed all radiographic images ordered to evaluate for the above acute complaints and reviewed radiology reports and findings.  These findings were personally discussed with the patient.  Please see medical record for radiology report.  ____________________________________________   PROCEDURES  Procedure(s) performed:  Procedures    Critical Care performed: no ____________________________________________   INITIAL IMPRESSION / ASSESSMENT AND PLAN / ED COURSE  Pertinent labs & imaging results that  were available during my care of the patient were reviewed by me and considered in my medical decision making (see chart for details).  DDX: htn, chf, copd, med noncompliance  Ardys Hataway is a 84 y.o. who presents to the ED with symptoms as described above.  Patient is asymptomatic with the exception of worsening exertional dyspnea.  May have some component of volume overload and underlying congestive heart failure but certainly no edema or hypoxia.  Blood work is otherwise reassuring with mildly elevated BNP.  I do believe the majority of her symptoms are related to medication noncompliance though appropriately so given her hives and allergic reaction to the medication.  We will give her a prescription for short course of Lasix as well as Norvasc.  Will give her referral to cardiology.  Discussed signs and symptoms for which she should return to the ER.      As part of my medical decision making, I reviewed the following data within the [place] notes reviewed and incorporated, Labs reviewed, notes from prior ED visits and [place] Controlled Substance Database   ____________________________________________   FINAL CLINICAL IMPRESSION(S) / ED DIAGNOSES  Final diagnoses:  Hypertension, unspecified type      NEW MEDICATIONS STARTED DURING THIS VISIT:  New Prescriptions   AMLODIPINE (NORVASC) 5 MG TABLET    Take 1 tablet (5 mg total) by mouth daily.   FUROSEMIDE (LASIX) 40 MG TABLET    Take 1 tablet (40 mg total) by mouth daily.     Note:  This document was prepared using Dragon voice recognition software and may include unintentional dictation errors.    Merlyn Lot, MD 08/24/17 2214

## 2017-08-24 NOTE — ED Notes (Signed)
Patient reports increased comfort of breathing post albuterol treatment

## 2017-08-25 ENCOUNTER — Ambulatory Visit: Payer: Medicare Other | Attending: Family | Admitting: Family

## 2017-08-25 ENCOUNTER — Encounter: Payer: Self-pay | Admitting: Family

## 2017-08-25 ENCOUNTER — Telehealth: Payer: Self-pay

## 2017-08-25 VITALS — BP 127/48 | HR 66 | Resp 18 | Ht 64.0 in | Wt 171.2 lb

## 2017-08-25 DIAGNOSIS — Z9889 Other specified postprocedural states: Secondary | ICD-10-CM | POA: Diagnosis not present

## 2017-08-25 DIAGNOSIS — E785 Hyperlipidemia, unspecified: Secondary | ICD-10-CM | POA: Insufficient documentation

## 2017-08-25 DIAGNOSIS — F17210 Nicotine dependence, cigarettes, uncomplicated: Secondary | ICD-10-CM | POA: Insufficient documentation

## 2017-08-25 DIAGNOSIS — Z85820 Personal history of malignant melanoma of skin: Secondary | ICD-10-CM | POA: Insufficient documentation

## 2017-08-25 DIAGNOSIS — I10 Essential (primary) hypertension: Secondary | ICD-10-CM

## 2017-08-25 DIAGNOSIS — Z79899 Other long term (current) drug therapy: Secondary | ICD-10-CM | POA: Diagnosis not present

## 2017-08-25 DIAGNOSIS — Z888 Allergy status to other drugs, medicaments and biological substances status: Secondary | ICD-10-CM | POA: Insufficient documentation

## 2017-08-25 DIAGNOSIS — Z820 Family history of epilepsy and other diseases of the nervous system: Secondary | ICD-10-CM | POA: Diagnosis not present

## 2017-08-25 DIAGNOSIS — Z720 Tobacco use: Secondary | ICD-10-CM | POA: Insufficient documentation

## 2017-08-25 DIAGNOSIS — I872 Venous insufficiency (chronic) (peripheral): Secondary | ICD-10-CM | POA: Insufficient documentation

## 2017-08-25 DIAGNOSIS — I110 Hypertensive heart disease with heart failure: Secondary | ICD-10-CM | POA: Diagnosis not present

## 2017-08-25 DIAGNOSIS — Z8679 Personal history of other diseases of the circulatory system: Secondary | ICD-10-CM | POA: Diagnosis not present

## 2017-08-25 DIAGNOSIS — I5032 Chronic diastolic (congestive) heart failure: Secondary | ICD-10-CM | POA: Diagnosis not present

## 2017-08-25 MED ORDER — FUROSEMIDE 40 MG PO TABS: 40.0000 mg | ORAL_TABLET | Freq: Every day | ORAL | 5 refills | Status: DC

## 2017-08-25 MED ORDER — AMLODIPINE BESYLATE 5 MG PO TABS: 5.0000 mg | ORAL_TABLET | Freq: Every day | ORAL | 5 refills | Status: DC

## 2017-08-25 NOTE — Telephone Encounter (Signed)
Copied from [place] [phone]. Topic: Quick Communication - Office Called Patient >> Aug 25, 2017  1:25 PM Pilar Grammes, CMA wrote: Reason for CRM: Tried to call pt to follow-up on recent ER visit. Per Dr Silvio Pate: Going today to CHF clinic but will need follow up with me--probably next week   PEC, if pt calls back, please schedule her a 15 minute appt with Dr Silvio Pate next week. Thanks

## 2017-08-25 NOTE — Patient Instructions (Signed)
Continue weighing daily and call for an overnight weight gain of > 2 pounds or a weekly weight gain of >5 pounds. 

## 2017-08-25 NOTE — Progress Notes (Signed)
Patient ID: Tiffany Martinez, female    DOB: 1933-09-27, 84 y.o.   MRN: [id number]  HPI  [name] is a 84 y/o female with a history of AAA, CLL, hyperlipidemia, HTN, current tobacco use and chronic heart failure.   Stress echo report from 03/26/10 reviewed and showed an EF of 60%.  Was in the ED 08/24/17 due to HTN along with edema. Medications adjusted and she was released.  She presents today for her initial visit with a chief complaint of moderate shortness of breath upon minimal exertion. She says that this has been present for several years but seems to be worsening. She has associated fatigue, light-headedness and edema along with this. She denies any difficulty sleeping, abdominal distention, palpitations, chest pain or weight gain. Picked up the amlodipine and furosemide today but hasn't started them yet.   Past Medical History:  Diagnosis Date  . AAA (abdominal aortic aneurysm) ([place]) 2012   3.9cm, rec rpt 1 yr  . Anemia   . Anxiety   . CHF (congestive heart failure) ([place])   . Chronic venous insufficiency   . CLL (chronic lymphocytic leukemia) ([place]) 2014   Stage 0--by flow cytometry  . Hx of colonoscopy   . Hyperlipidemia   . Hypertension   . Melanoma ([place])    Right Arm  . PAD (peripheral artery disease) ([place]) 2012   severe R common iliac stenosis, ABI 0.73, mid L common iliac stenosis ABI 1.1  . PAD (peripheral artery disease) ([place])    Past Surgical History:  Procedure Laterality Date  . ABDOMINAL AORTIC ANEURYSM REPAIR  02/16/12   [facility]--Dr Sammuel Hines  . CARDIOVASCULAR STRESS TEST  2008   negative  . DOBUTAMINE STRESS ECHO  11/11   negative  . KNEE ARTHROSCOPY  1998   right  . MELANOMA EXCISION  11/09/10   wide excision right upper arm  . SQUAMOUS CELL CARCINOMA EXCISION  02/2016  . TONSILLECTOMY AND ADENOIDECTOMY  1960   Family History  Problem Relation Age of Onset  . Alzheimer's disease Mother   . Pancreatic cancer Father   . Parkinson's disease Brother   .  Coronary artery disease Neg Hx   . Diabetes Neg Hx   . Cancer Neg Hx        breast or colon   Social History   Tobacco Use  . Smoking status: Current Some Day Smoker    Types: Cigarettes  . Smokeless tobacco: Never Used  . Tobacco comment: pt states that she has 1-2 cigarettes per day with dinner  Substance Use Topics  . Alcohol use: Yes    Alcohol/week: 0.0 oz    Comment: regular wine with dinner   Allergies  Allergen Reactions  . Triamterene Hives   Prior to Admission medications   Medication Sig Start Date End Date Taking? Authorizing Provider  atorvastatin (LIPITOR) 20 MG tablet TAKE 1 TABLET DAILY 02/07/17  Yes Viviana Simpler I, MD  amLODipine (NORVASC) 5 MG tablet Take 1 tablet (5 mg total) by mouth daily. 08/25/17 08/25/18  Alisa Graff, FNP  furosemide (LASIX) 40 MG tablet Take 1 tablet (40 mg total) by mouth daily. 08/25/17 08/25/18  Alisa Graff, FNP   Review of Systems  Constitutional: Positive for fatigue. Negative for appetite change.  Eyes: Negative.   Respiratory: Positive for shortness of breath. Negative for chest tightness.   Cardiovascular: Positive for leg swelling. Negative for chest pain and palpitations.  Gastrointestinal: Negative for abdominal distention and abdominal pain.  Endocrine:  Negative.   Genitourinary: Negative.   Musculoskeletal: Negative for back pain and neck pain.  Skin: Negative.   Allergic/Immunologic: Negative.   Neurological: Positive for light-headedness (slightly). Negative for dizziness.  Hematological: Negative for adenopathy. Does not bruise/bleed easily.  Psychiatric/Behavioral: Negative for dysphoric mood and sleep disturbance (sleeping on 2 pillows). The patient is not nervous/anxious.     Vitals:   08/25/17 1308  BP: (!) 127/48  Pulse: 66  Resp: 18  SpO2: 99%  Weight: 171 lb 4 oz (77.7 kg)  Height: 5\' 4"  (1.626 m)   Wt Readings from Last 3 Encounters:  08/25/17 171 lb 4 oz (77.7 kg)  08/24/17 162 lb (73.5 kg)   09/16/16 166 lb (75.3 kg)   Lab Results  Component Value Date   CREATININE 1.06 (H) 08/24/2017   CREATININE 1.51 (H) 09/16/2016   CREATININE 1.10 (H) 12/31/2015   Physical Exam  Constitutional: She is oriented to person, place, and time. She appears well-developed and well-nourished.  HENT:  Head: Normocephalic and atraumatic.  Neck: Normal range of motion. Neck supple. No JVD present.  Cardiovascular: Normal rate and regular rhythm.  Pulmonary/Chest: Effort normal. She has no wheezes. She has no rales.  Abdominal: Soft. She exhibits no distension. There is no tenderness.  Musculoskeletal: She exhibits edema (trace pitting edema in bilateral lower legs). She exhibits no tenderness.  Neurological: She is alert and oriented to person, place, and time.  Skin: Skin is warm and dry.  Psychiatric: She has a normal mood and affect. Her behavior is normal. Thought content normal.  Nursing note and vitals reviewed.  Assessment & Plan:  1: Chronic heart failure with preserved ejection fraction- - NYHA class III - minimally fluid overloaded today - has scales but has been weighing intermittently. Instructed to begin weighing daily and call for an overnight weight gain of >2 pounds or a weekly weight gain of >5 pounds - not adding salt and has been reading food labels. Written dietary information was given to her about this - encouraged her to elevate her legs when sitting for long periods of time as well as wear compression socks daily - BNP 08/24/17 was 200.0 - last echo was done in 2011; discussed ordering an updated echo at her next visit  2: HTN- - BP looks good today - starting amlodipine/furosemide tomorrow - saw PCP Silvio Pate) 09/16/16 - BMP on 08/24/17 reviewed and showed sodium 138, potassium 3.9 and GFR 48  3: Tobacco use- - smokes 8-9 cigarettes daily - complete cessation discussed for 3 minutes with her - says that she has a goal of decreasing 1 cigarette every few  days  Medication list was reviewed.  Return in 1 month or sooner for any questions/problems before then.

## 2017-08-26 ENCOUNTER — Telehealth: Payer: Self-pay | Admitting: Internal Medicine

## 2017-08-26 NOTE — Telephone Encounter (Signed)
Copied from [place] [id number]. Topic: General - Other >> Aug 26, 2017  9:39 AM Darl Householder, RMA wrote: Reason for CRM: patient is requesting a call back concerning Dr. Silvio Pate visiting her at [facility], please return pt call

## 2017-08-26 NOTE — Telephone Encounter (Signed)
See new telephone encounter tha was created by [facility] today.

## 2017-08-26 NOTE — Telephone Encounter (Signed)
I spoke to patient and she scheduled appointment on 09/14/17.

## 2017-08-26 NOTE — Telephone Encounter (Signed)
I spoke with pt; pt was seen in ED and pt had a heart problem with high blood pressure. Pt was seen by CHF clinic on 08/25/17 and was advised to take med that she is concerned about. Pt has appt with Dr Silvio Pate on 09/06/17. Pt was advised not to drive. Pt lives at [facility] in independent living and wants to know if Dr Silvio Pate could see her at [facility]. Pt request cb.

## 2017-08-26 NOTE — Telephone Encounter (Signed)
Discussed that it is probably okay for her to drive short distances. She was seen by CHF clinic and seems stable Please just call her to set up appt in about 2 weeks

## 2017-09-06 ENCOUNTER — Ambulatory Visit: Payer: Medicare Other | Admitting: Internal Medicine

## 2017-09-06 DIAGNOSIS — Z2089 Contact with and (suspected) exposure to other communicable diseases: Secondary | ICD-10-CM

## 2017-09-07 ENCOUNTER — Other Ambulatory Visit: Payer: Self-pay | Admitting: Family

## 2017-09-07 MED ORDER — FUROSEMIDE 40 MG PO TABS: 40.0000 mg | ORAL_TABLET | Freq: Every day | ORAL | 5 refills | Status: DC

## 2017-09-07 MED ORDER — ATORVASTATIN CALCIUM 20 MG PO TABS: 20.0000 mg | ORAL_TABLET | Freq: Every day | ORAL | 5 refills | Status: DC

## 2017-09-07 MED ORDER — AMLODIPINE BESYLATE 5 MG PO TABS: 5.0000 mg | ORAL_TABLET | Freq: Every day | ORAL | 5 refills | Status: DC

## 2017-09-14 ENCOUNTER — Ambulatory Visit (INDEPENDENT_AMBULATORY_CARE_PROVIDER_SITE_OTHER): Payer: Medicare Other | Admitting: Internal Medicine

## 2017-09-14 ENCOUNTER — Encounter: Payer: Self-pay | Admitting: Internal Medicine

## 2017-09-14 VITALS — BP 118/60 | HR 64 | Temp 98.0°F | Ht 64.0 in | Wt 167.0 lb

## 2017-09-14 DIAGNOSIS — I700 Atherosclerosis of aorta: Secondary | ICD-10-CM | POA: Diagnosis not present

## 2017-09-14 DIAGNOSIS — I5032 Chronic diastolic (congestive) heart failure: Secondary | ICD-10-CM | POA: Diagnosis not present

## 2017-09-14 DIAGNOSIS — C9190 Lymphoid leukemia, unspecified not having achieved remission: Secondary | ICD-10-CM | POA: Diagnosis not present

## 2017-09-14 DIAGNOSIS — I10 Essential (primary) hypertension: Secondary | ICD-10-CM

## 2017-09-14 DIAGNOSIS — I714 Abdominal aortic aneurysm, without rupture, unspecified: Secondary | ICD-10-CM

## 2017-09-14 DIAGNOSIS — F39 Unspecified mood [affective] disorder: Secondary | ICD-10-CM

## 2017-09-14 DIAGNOSIS — C9110 Chronic lymphocytic leukemia of B-cell type not having achieved remission: Secondary | ICD-10-CM

## 2017-09-14 DIAGNOSIS — I739 Peripheral vascular disease, unspecified: Secondary | ICD-10-CM

## 2017-09-14 NOTE — Assessment & Plan Note (Signed)
No apparent problems with endovascular exam

## 2017-09-14 NOTE — Assessment & Plan Note (Signed)
Chronic right leg claudication (heaviness) No change Must stop the cigarettes

## 2017-09-14 NOTE — Progress Notes (Signed)
Subjective:    Patient ID: Tiffany Martinez, female    DOB: 1933-10-01, 84 y.o.   MRN: [id number]  HPI Here for ER follow up BP was elevated --wasn't taking meds.  Diagnosed with CHF---diastolic (though no echo) Did go to CHF clinic Careful about salt Edema is resolved  Still having trouble breathing--- like coming back up hill after feeding swans at [facility] No chest pain Some dry cough--clear sputum Is currently weaning off cigarettes--down to 2 per day  Monitoring BP regularly 120/53-147/48 No palpitations No dizziness or syncope  Persistent right leg heaviness with exertion Resolves quickly No apparent problems with the aneursym repair Stopped the regular checks on the endovascular repair Aortic atherosclerosis noted on multiple imaging exams  Stopped ASA--- had biopsy with dermatologist and it wouldn't stop bleeding   Has had chronic anemia--even when young WBC elevated and had bone marrow at [facility]--years ago Some question of CLL  Still has stress with husband's family Put assets in trust Mood is better now---not as anxious  Some fatigue now  Current Outpatient Medications on File Prior to Visit  Medication Sig Dispense Refill  . amLODipine (NORVASC) 5 MG tablet Take 1 tablet (5 mg total) by mouth daily. 30 tablet 5  . atorvastatin (LIPITOR) 20 MG tablet Take 1 tablet (20 mg total) by mouth daily. 30 tablet 5  . furosemide (LASIX) 40 MG tablet Take 1 tablet (40 mg total) by mouth daily. 30 tablet 5   No current facility-administered medications on file prior to visit.     Allergies  Allergen Reactions  . Triamterene Hives    Past Medical History:  Diagnosis Date  . AAA (abdominal aortic aneurysm) ([place]) 2012   3.9cm, rec rpt 1 yr  . Anemia   . Anxiety   . CHF (congestive heart failure) ([place])   . Chronic venous insufficiency   . CLL (chronic lymphocytic leukemia) ([place]) 2014   Stage 0--by flow cytometry  . Hx of colonoscopy   . Hyperlipidemia   .  Hypertension   . Melanoma ([place])    Right Arm  . PAD (peripheral artery disease) ([place]) 2012   severe R common iliac stenosis, ABI 0.73, mid L common iliac stenosis ABI 1.1  . PAD (peripheral artery disease) ([place])     Past Surgical History:  Procedure Laterality Date  . ABDOMINAL AORTIC ANEURYSM REPAIR  02/16/12   [facility]--Dr Sammuel Hines  . CARDIOVASCULAR STRESS TEST  2008   negative  . DOBUTAMINE STRESS ECHO  11/11   negative  . KNEE ARTHROSCOPY  1998   right  . MELANOMA EXCISION  11/09/10   wide excision right upper arm  . SQUAMOUS CELL CARCINOMA EXCISION  02/2016  . TONSILLECTOMY AND ADENOIDECTOMY  1960    Family History  Problem Relation Age of Onset  . Alzheimer's disease Mother   . Pancreatic cancer Father   . Parkinson's disease Brother   . Coronary artery disease Neg Hx   . Diabetes Neg Hx   . Cancer Neg Hx        breast or colon    Social History   Socioeconomic History  . Marital status: Widowed    Spouse name: Not on file  . Number of children: 4  . Years of education: Not on file  . Highest education level: Not on file  Occupational History  . Occupation: retired- Therapist, sports,   Social Needs  . Financial resource strain: Not on file  . Food insecurity:    Worry: Not  on file    Inability: Not on file  . Transportation needs:    Medical: Not on file    Non-medical: Not on file  Tobacco Use  . Smoking status: Current Some Day Smoker    Types: Cigarettes  . Smokeless tobacco: Never Used  . Tobacco comment: pt states that she has 1-2 cigarettes per day with dinner  Substance and Sexual Activity  . Alcohol use: Yes    Alcohol/week: 0.0 oz    Comment: regular wine with dinner  . Drug use: No  . Sexual activity: Never  Lifestyle  . Physical activity:    Days per week: Not on file    Minutes per session: Not on file  . Stress: Not on file  Relationships  . Social connections:    Talks on phone: Not on file    Gets together: Not on file    Attends religious  service: Not on file    Active member of club or organization: Not on file    Attends meetings of clubs or organizations: Not on file    Relationship status: Not on file  . Intimate partner violence:    Fear of current or ex partner: Not on file    Emotionally abused: Not on file    Physically abused: Not on file    Forced sexual activity: Not on file  Other Topics Concern  . Not on file  Social History Narrative   Widowed 2/11 -2nd for him , 1st for her. 4 step sons. 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   Requests DNR   Discussed MOST form---but would probably accept IV fluids/antibiotics, etc   Requests no feeding tube         Review of Systems  Not sleeping well---trouble initiating (not till 2AM) since the ER visit. No nocturia 2 pillows --no PND Appetite is good--eating healthy Weighing daily---down 13# from recent maximum (161# today) Feet stay cold    Objective:   Physical Exam  Constitutional: She appears well-developed. No distress.  Neck: No thyromegaly present.  Cardiovascular: Normal rate, regular rhythm and normal heart sounds. Exam reveals no friction rub.  No murmur heard. Faint pedal pulses  Pulmonary/Chest: Effort normal and breath sounds normal. No stridor. No respiratory distress. She has no wheezes.  Abdominal: Soft. She exhibits no distension and no mass. There is no tenderness. There is no guarding.  Musculoskeletal: She exhibits no edema or tenderness.  Lymphadenopathy:    She has no cervical adenopathy.  Psychiatric: She has a normal mood and affect. Her behavior is normal.          Assessment & Plan:

## 2017-09-14 NOTE — Assessment & Plan Note (Signed)
Seems to be compensated now Only mild when in ER Will check echo May be overdiuresed---will put weight limit on furosemide

## 2017-09-14 NOTE — Assessment & Plan Note (Signed)
Long standing finding On statin Off ASA now due to easy bleeding

## 2017-09-14 NOTE — Patient Instructions (Addendum)
Please stop the cigarettes. Use a nicotine lozenge if you get a craving ---no more cigarettes. Only take the furosemide if your morning weight is 165# or above.

## 2017-09-14 NOTE — Assessment & Plan Note (Signed)
BP Readings from Last 3 Encounters:  09/14/17 118/60  08/25/17 (!) 127/48  08/24/17 (!) 166/66   Fine now

## 2017-09-14 NOTE — Assessment & Plan Note (Signed)
Anxiety is better No meds

## 2017-09-14 NOTE — Assessment & Plan Note (Signed)
Presumptive diagnosis with chronic anemia and elevated WBC May need to go back to [facility] to review status (but not much higher than years ago)

## 2017-09-22 NOTE — Progress Notes (Signed)
Patient ID: Tiffany Martinez, female    DOB: Sep 24, 1933, 84 y.o.   MRN: [id number]  HPI  Tiffany Martinez is a 84 y/o female with a history of AAA, CLL, hyperlipidemia, HTN, current tobacco use and chronic heart failure.   Stress echo report from 03/26/10 reviewed and showed an EF of 60%.  Was in the ED 08/24/17 due to HTN along with edema. Medications adjusted and she was released.  She presents today for a follow-up visit with a chief complaint of minimal shortness of breath upon moderate exertion. She says that her breathing has improved quite a bit since she was last here. She has associated fatigue and chronic difficulty sleeping along with this. She denies any abdominal distention, palpitations, edema, chest pain, dizziness or weight gain.   Past Medical History:  Diagnosis Date  . AAA (abdominal aortic aneurysm) ([place]) 2012   3.9cm, rec rpt 1 yr  . Anemia   . Anxiety   . CHF (congestive heart failure) ([place])   . Chronic venous insufficiency   . CLL (chronic lymphocytic leukemia) ([place]) 2014   Stage 0--by flow cytometry  . Hx of colonoscopy   . Hyperlipidemia   . Hypertension   . Melanoma ([place])    Right Arm  . PAD (peripheral artery disease) ([place]) 2012   severe R common iliac stenosis, ABI 0.73, mid L common iliac stenosis ABI 1.1  . PAD (peripheral artery disease) ([place])    Past Surgical History:  Procedure Laterality Date  . ABDOMINAL AORTIC ANEURYSM REPAIR  02/16/12   [facility]--Dr Sammuel Hines  . CARDIOVASCULAR STRESS TEST  2008   negative  . DOBUTAMINE STRESS ECHO  11/11   negative  . KNEE ARTHROSCOPY  1998   right  . MELANOMA EXCISION  11/09/10   wide excision right upper arm  . SQUAMOUS CELL CARCINOMA EXCISION  02/2016  . TONSILLECTOMY AND ADENOIDECTOMY  1960   Family History  Problem Relation Age of Onset  . Alzheimer's disease Mother   . Pancreatic cancer Father   . Parkinson's disease Brother   . Coronary artery disease Neg Hx   . Diabetes Neg Hx   . Cancer Neg Hx     breast or colon   Social History   Tobacco Use  . Smoking status: Current Some Day Smoker    Types: Cigarettes  . Smokeless tobacco: Never Used  . Tobacco comment: pt states that she has 1-2 cigarettes per day with dinner  Substance Use Topics  . Alcohol use: Yes    Alcohol/week: 0.0 oz    Comment: regular wine with dinner   Allergies  Allergen Reactions  . Triamterene Hives   Prior to Admission medications   Medication Sig Start Date End Date Taking? Authorizing Provider  amLODipine (NORVASC) 5 MG tablet Take 1 tablet (5 mg total) by mouth daily. 09/07/17 09/07/18 Yes Teresa Lemmerman, Otila Kluver A, FNP  atorvastatin (LIPITOR) 20 MG tablet Take 1 tablet (20 mg total) by mouth daily. 09/07/17  Yes Alisa Graff, FNP    Review of Systems  Constitutional: Positive for fatigue. Negative for appetite change.  Eyes: Negative.   Respiratory: Positive for shortness of breath (much better). Negative for chest tightness.   Cardiovascular: Negative for chest pain, palpitations and leg swelling.  Gastrointestinal: Negative for abdominal distention and abdominal pain.  Endocrine: Negative.   Genitourinary: Negative.   Musculoskeletal: Negative for back pain and neck pain.  Skin: Negative.   Allergic/Immunologic: Negative.   Neurological: Negative for dizziness and light-headedness.  Hematological: Negative for adenopathy. Does not bruise/bleed easily.  Psychiatric/Behavioral: Positive for sleep disturbance (not sleeping well;sleeping on 2 pillows). Negative for dysphoric mood. The patient is not nervous/anxious.    Vitals:   09/26/17 1140  BP: (!) 143/51  Pulse: 65  Resp: 18  SpO2: 99%  Weight: 168 lb 4 oz (76.3 kg)  Height: 5\' 4"  (1.626 m)   Wt Readings from Last 3 Encounters:  09/26/17 168 lb 4 oz (76.3 kg)  09/14/17 167 lb (75.8 kg)  08/25/17 171 lb 4 oz (77.7 kg)   Lab Results  Component Value Date   CREATININE 1.06 (H) 08/24/2017   CREATININE 1.51 (H) 09/16/2016   CREATININE 1.10  (H) 12/31/2015    Physical Exam  Constitutional: She is oriented to person, place, and time. She appears well-developed and well-nourished.  HENT:  Head: Normocephalic and atraumatic.  Neck: Normal range of motion. Neck supple. No JVD present.  Cardiovascular: Normal rate and regular rhythm.  Pulmonary/Chest: Effort normal. She has no wheezes. She has no rales.  Abdominal: Soft. She exhibits no distension. There is no tenderness.  Musculoskeletal: She exhibits no edema or tenderness.  Neurological: She is alert and oriented to person, place, and time.  Skin: Skin is warm and dry.  Psychiatric: She has a normal mood and affect. Her behavior is normal. Thought content normal.  Nursing note and vitals reviewed.  Assessment & Plan:  1: Chronic heart failure with preserved ejection fraction- - NYHA class II - euvolemic today - weighing daily. Reminde to call for an overnight weight gain of >2 pounds or a weekly weight gain of >5 pounds - not adding salt and has been reading food labels. Reminded to closely follow a 2000mg  sodium diet - PCP has stopped furosemide at this time - feeding swans daily and is able to do it without stopping - BNP 08/24/17 was 200.0 - echo currently scheduled for 09/30/17  2: HTN- - BP looks good today - saw PCP Silvio Pate) 09/14/17 - BMP on 08/24/17 reviewed and showed sodium 138, potassium 3.9 and GFR 48  3: Tobacco use- - smokes 3 cigarettes daily - complete cessation discussed for 3 minutes with her  Medication bottles were reviewed  Return in 6 months or sooner for any questions/problems before then.

## 2017-09-26 ENCOUNTER — Encounter: Payer: Self-pay | Admitting: Family

## 2017-09-26 ENCOUNTER — Ambulatory Visit: Payer: Medicare Other | Attending: Family | Admitting: Family

## 2017-09-26 VITALS — BP 143/51 | HR 65 | Resp 18 | Ht 64.0 in | Wt 168.2 lb

## 2017-09-26 DIAGNOSIS — F419 Anxiety disorder, unspecified: Secondary | ICD-10-CM | POA: Diagnosis not present

## 2017-09-26 DIAGNOSIS — I10 Essential (primary) hypertension: Secondary | ICD-10-CM

## 2017-09-26 DIAGNOSIS — I872 Venous insufficiency (chronic) (peripheral): Secondary | ICD-10-CM | POA: Diagnosis not present

## 2017-09-26 DIAGNOSIS — F17210 Nicotine dependence, cigarettes, uncomplicated: Secondary | ICD-10-CM | POA: Insufficient documentation

## 2017-09-26 DIAGNOSIS — Z85820 Personal history of malignant melanoma of skin: Secondary | ICD-10-CM | POA: Diagnosis not present

## 2017-09-26 DIAGNOSIS — R0602 Shortness of breath: Secondary | ICD-10-CM | POA: Diagnosis not present

## 2017-09-26 DIAGNOSIS — E785 Hyperlipidemia, unspecified: Secondary | ICD-10-CM | POA: Insufficient documentation

## 2017-09-26 DIAGNOSIS — Z720 Tobacco use: Secondary | ICD-10-CM

## 2017-09-26 DIAGNOSIS — I5032 Chronic diastolic (congestive) heart failure: Secondary | ICD-10-CM | POA: Diagnosis not present

## 2017-09-26 DIAGNOSIS — Z79899 Other long term (current) drug therapy: Secondary | ICD-10-CM | POA: Diagnosis not present

## 2017-09-26 DIAGNOSIS — I110 Hypertensive heart disease with heart failure: Secondary | ICD-10-CM | POA: Insufficient documentation

## 2017-09-26 DIAGNOSIS — Z8679 Personal history of other diseases of the circulatory system: Secondary | ICD-10-CM | POA: Insufficient documentation

## 2017-09-26 NOTE — Patient Instructions (Signed)
Continue weighing daily and call for an overnight weight gain of > 2 pounds or a weekly weight gain of >5 pounds. 

## 2017-09-30 ENCOUNTER — Ambulatory Visit (INDEPENDENT_AMBULATORY_CARE_PROVIDER_SITE_OTHER): Payer: Medicare Other

## 2017-09-30 ENCOUNTER — Other Ambulatory Visit: Payer: Self-pay

## 2017-09-30 DIAGNOSIS — I5032 Chronic diastolic (congestive) heart failure: Secondary | ICD-10-CM

## 2017-12-01 ENCOUNTER — Telehealth: Payer: Self-pay | Admitting: Family

## 2017-12-01 NOTE — Telephone Encounter (Signed)
Patient called to say that her BP and weight have been fluctuating some. She said the top number of her BP has been anywhere from upper 140's to now mid 160's. She says that her weight has ranged from 163 up to 167. She says that her ankles don't look swollen but that her pants are fitting tighter than they used to.   Patient has furosemide that she can take PRN but she hasn't taken any of that as she thought her atorvastatin had a diuretic in with it. Explained that atorvastatin was for her cholesterol only.   Advised her to take her 40mg  furosemide QOD for this next week and to keep up with her weight and BP and how her pants fit. She may need to take it a few times a week to keep her BP lower and her fluid off.   Patient verbalized understanding of instructions.

## 2017-12-26 ENCOUNTER — Encounter: Payer: Self-pay | Admitting: Internal Medicine

## 2017-12-26 ENCOUNTER — Ambulatory Visit (INDEPENDENT_AMBULATORY_CARE_PROVIDER_SITE_OTHER): Payer: Medicare Other | Admitting: Internal Medicine

## 2017-12-26 VITALS — BP 110/60 | HR 64 | Temp 98.0°F | Ht 64.75 in | Wt 166.0 lb

## 2017-12-26 DIAGNOSIS — N183 Chronic kidney disease, stage 3 unspecified: Secondary | ICD-10-CM

## 2017-12-26 DIAGNOSIS — I5032 Chronic diastolic (congestive) heart failure: Secondary | ICD-10-CM

## 2017-12-26 DIAGNOSIS — C9190 Lymphoid leukemia, unspecified not having achieved remission: Secondary | ICD-10-CM

## 2017-12-26 DIAGNOSIS — N184 Chronic kidney disease, stage 4 (severe): Secondary | ICD-10-CM | POA: Insufficient documentation

## 2017-12-26 DIAGNOSIS — C9110 Chronic lymphocytic leukemia of B-cell type not having achieved remission: Secondary | ICD-10-CM

## 2017-12-26 DIAGNOSIS — Z7189 Other specified counseling: Secondary | ICD-10-CM

## 2017-12-26 DIAGNOSIS — I714 Abdominal aortic aneurysm, without rupture, unspecified: Secondary | ICD-10-CM

## 2017-12-26 DIAGNOSIS — Z0000 Encounter for general adult medical examination without abnormal findings: Secondary | ICD-10-CM

## 2017-12-26 DIAGNOSIS — I700 Atherosclerosis of aorta: Secondary | ICD-10-CM

## 2017-12-26 DIAGNOSIS — I739 Peripheral vascular disease, unspecified: Secondary | ICD-10-CM | POA: Diagnosis not present

## 2017-12-26 DIAGNOSIS — F39 Unspecified mood [affective] disorder: Secondary | ICD-10-CM

## 2017-12-26 DIAGNOSIS — J449 Chronic obstructive pulmonary disease, unspecified: Secondary | ICD-10-CM | POA: Diagnosis not present

## 2017-12-26 NOTE — Assessment & Plan Note (Signed)
Will recheck If her home BP stays up, would start low dose ACEI or ARB

## 2017-12-26 NOTE — Assessment & Plan Note (Signed)
Mild CXR findings Chronic DOE likely more from this than the CHF

## 2017-12-26 NOTE — Assessment & Plan Note (Signed)
Compensated  On furosemide--may need to take daily if fluid builds

## 2017-12-26 NOTE — Assessment & Plan Note (Signed)
Chronic but hopefully will not get worse since she finally stopped smoking

## 2017-12-26 NOTE — Assessment & Plan Note (Signed)
Has stent in  Hasn't been getting this monitored but no apparent problems

## 2017-12-26 NOTE — Assessment & Plan Note (Signed)
Mild motivational issues No need for meds

## 2017-12-26 NOTE — Assessment & Plan Note (Signed)
Via imaging Is on statin

## 2017-12-26 NOTE — Assessment & Plan Note (Signed)
Mild elevated WBC---not excited about treatment regardless

## 2017-12-26 NOTE — Progress Notes (Addendum)
Subjective:    Patient ID: Tiffany Martinez, female    DOB: 1933/10/17, 84 y.o.   MRN: [id number]  HPI Here for Medicare wellness visit and follow up of chronic health conditions Reviewed form and advanced directives Reviewed other doctors Did finally stop smoking Occasional glass of wine Has not really been exercising--but does garden. discussed No falls--but occasional dizziness Vision is fine--due for check up Hearing is fine Independent with instrumental ADLs Some short term memory issues  Has been checking BP at times BP as high as 010 systolic Discussed calibrating it with nurses at [facility] No chest pain Still with DOE--- not as bad as a few months ago Monitoring weight at home---up and down some but fairly stable Now taking furosemide every other day No sig edema--but some abdominal distention Not really doing much exercise  Stopped getting evaluations for AAA Has stent in --but hasn't gotten it checked Known vascular disease--may be part of her stamina issues (but no clear claudication) Known atherosclerosis as well---on statin  Reviewed elevated WBC No action required about the CLL  Some mood issues Just "stops"-- and no motivation  No persistent issues  Recent labs showed  Persistent GFR under 60--but improved from last few years   Current Outpatient Medications on File Prior to Visit  Medication Sig Dispense Refill  . amLODipine (NORVASC) 5 MG tablet Take 1 tablet (5 mg total) by mouth daily. 30 tablet 5  . atorvastatin (LIPITOR) 20 MG tablet Take 1 tablet (20 mg total) by mouth daily. 30 tablet 5  . furosemide (LASIX) 40 MG tablet Take 1 tablet by mouth every other day.  5   No current facility-administered medications on file prior to visit.     Allergies  Allergen Reactions  . Triamterene Hives    Past Medical History:  Diagnosis Date  . AAA (abdominal aortic aneurysm) ([place]) 2012   3.9cm, rec rpt 1 yr  . Anemia   . Anxiety   . CHF  (congestive heart failure) ([place])   . Chronic venous insufficiency   . CLL (chronic lymphocytic leukemia) ([place]) 2014   Stage 0--by flow cytometry  . Hx of colonoscopy   . Hyperlipidemia   . Hypertension   . Melanoma ([place])    Right Arm  . PAD (peripheral artery disease) ([place]) 2012   severe R common iliac stenosis, ABI 0.73, mid L common iliac stenosis ABI 1.1  . PAD (peripheral artery disease) ([place])     Past Surgical History:  Procedure Laterality Date  . ABDOMINAL AORTIC ANEURYSM REPAIR  02/16/12   [facility]--Dr Sammuel Hines  . CARDIOVASCULAR STRESS TEST  2008   negative  . DOBUTAMINE STRESS ECHO  11/11   negative  . KNEE ARTHROSCOPY  1998   right  . MELANOMA EXCISION  11/09/10   wide excision right upper arm  . SQUAMOUS CELL CARCINOMA EXCISION  02/2016  . TONSILLECTOMY AND ADENOIDECTOMY  1960    Family History  Problem Relation Age of Onset  . Alzheimer's disease Mother   . Pancreatic cancer Father   . Parkinson's disease Brother   . Coronary artery disease Neg Hx   . Diabetes Neg Hx   . Cancer Neg Hx        breast or colon    Social History   Socioeconomic History  . Marital status: Widowed    Spouse name: Not on file  . Number of children: 4  . Years of education: Not on file  . Highest education level: Not  on file  Occupational History  . Occupation: retired- Therapist, sports,   Social Needs  . Financial resource strain: Not on file  . Food insecurity:    Worry: Not on file    Inability: Not on file  . Transportation needs:    Medical: Not on file    Non-medical: Not on file  Tobacco Use  . Smoking status: Former Smoker    Types: Cigarettes    Last attempt to quit: 08/15/2017    Years since quitting: 0.3  . Smokeless tobacco: Never Used  Substance and Sexual Activity  . Alcohol use: Yes    Alcohol/week: 0.0 standard drinks    Comment: regular wine with dinner  . Drug use: No  . Sexual activity: Never  Lifestyle  . Physical activity:    Days per week: Not on file     Minutes per session: Not on file  . Stress: Not on file  Relationships  . Social connections:    Talks on phone: Not on file    Gets together: Not on file    Attends religious service: Not on file    Active member of club or organization: Not on file    Attends meetings of clubs or organizations: Not on file    Relationship status: Not on file  . Intimate partner violence:    Fear of current or ex partner: Not on file    Emotionally abused: Not on file    Physically abused: Not on file    Forced sexual activity: Not on file  Other Topics Concern  . Not on file  Social History Narrative   Widowed 2/11 -2nd for him , 1st for her. 4 step sons. 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   Requests DNR   Discussed MOST form---but would probably accept IV fluids/antibiotics, etc   Requests no feeding tube         Review of Systems Back catches at times Appetite isn't great--- is cooking Sense of taste is off some Weight stable though Wears seat belt Dentures---satisfied with them Sees dermatologist regularly--gets cryotherapy every time Bowels are fine--no blood Urine is fine--no incontinence No heartburn or dysphagia Gets hives--takes benedryl (discussed alternatives)    Objective:   Physical Exam  Constitutional: She is oriented to person, place, and time. She appears well-developed. No distress.  HENT:  Mouth/Throat: Oropharynx is clear and moist. No oropharyngeal exudate.  Neck: No thyromegaly present.  Cardiovascular: Normal rate, regular rhythm, normal heart sounds and intact distal pulses. Exam reveals no gallop.  No murmur heard. Respiratory: Effort normal. No respiratory distress. She has no wheezes. She has no rales.  Slightly decreased breath sounds  GI: Soft. There is no tenderness.  Musculoskeletal: She exhibits no tenderness.  Trace edema in calves  only  Lymphadenopathy:    She has no cervical adenopathy.  Neurological: She is alert and oriented to person, place, and time.  President-- "[employer]" [phone] "I'm not good at math" [id number] Recall 3/3  Skin: No rash noted. No erythema.  Psychiatric: She has a normal mood and affect. Her behavior is normal.           Assessment & Plan:

## 2017-12-26 NOTE — Assessment & Plan Note (Signed)
Has DNR 

## 2017-12-26 NOTE — Patient Instructions (Addendum)
Please ask the pharmacist about the shingrix vaccine. I would recommend cetirizine 10mg  or fexofenadine 180mg  daily (over the counter) for hives.

## 2017-12-26 NOTE — Assessment & Plan Note (Signed)
I have personally reviewed the Medicare Annual Wellness questionnaire and have noted 1. The patient's medical and social history 2. Their use of alcohol, tobacco or illicit drugs 3. Their current medications and supplements 4. The patient's functional ability including ADL's, fall risks, home safety risks and hearing or visual             impairment. 5. Diet and physical activities 6. Evidence for depression or mood disorders  The patients weight, height, BMI and visual acuity have been recorded in the chart I have made referrals, counseling and provided education to the patient based review of the above and I have provided the pt with a written personalized care plan for preventive services.  I have provided you with a copy of your personalized plan for preventive services. Please take the time to review along with your updated medication list.  No cancer screening Discussed exercise Will look into shingrix Yearly flu vaccine

## 2017-12-27 ENCOUNTER — Telehealth: Payer: Self-pay | Admitting: Radiology

## 2017-12-27 LAB — LIPID PANEL
CHOL/HDL RATIO: 3
Cholesterol: 120 mg/dL (ref 0–200)
HDL: 34.7 mg/dL — ABNORMAL LOW (ref >39.00)
LDL Cholesterol: 52 mg/dL (ref 0–99)
NONHDL: 85.17
TRIGLYCERIDES: 165 mg/dL — AB (ref 0.0–149.0)
VLDL: 33 mg/dL (ref 0.0–40.0)

## 2017-12-27 LAB — COMPREHENSIVE METABOLIC PANEL
ALT: 12 U/L (ref 0–35)
AST: 15 U/L (ref 0–37)
Albumin: 4.1 g/dL (ref 3.5–5.2)
Alkaline Phosphatase: 78 U/L (ref 39–117)
BILIRUBIN TOTAL: 0.5 mg/dL (ref 0.2–1.2)
BUN: 20 mg/dL (ref 6–23)
CHLORIDE: 107 meq/L (ref 96–112)
CO2: 28 meq/L (ref 19–32)
CREATININE: 1.31 mg/dL — AB (ref 0.40–1.20)
Calcium: 9.9 mg/dL (ref 8.4–10.5)
GFR: 41.33 mL/min — ABNORMAL LOW (ref >60.00)
Glucose, Bld: 112 mg/dL — ABNORMAL HIGH (ref 70–99)
Potassium: 4.2 mEq/L (ref 3.5–5.1)
SODIUM: 140 meq/L (ref 135–145)
Total Protein: 6.6 g/dL (ref 6.0–8.3)

## 2017-12-27 LAB — CBC
HEMATOCRIT: 29.7 % — AB (ref 36.0–46.0)
Hemoglobin: 10.1 g/dL — ABNORMAL LOW (ref 12.0–15.0)
MCHC: 34.1 g/dL (ref 30.0–36.0)
MCV: 108.3 fl — AB (ref 78.0–100.0)
Platelets: 165 10*3/uL (ref 150.0–400.0)
RBC: 2.74 Mil/uL — ABNORMAL LOW (ref 3.87–5.11)
RDW: 16.8 % — AB (ref 11.5–15.5)
WBC: 19.1 10*3/uL (ref 4.0–10.5)

## 2017-12-27 LAB — T4, FREE: Free T4: 0.76 ng/dL (ref 0.60–1.60)

## 2017-12-27 NOTE — Telephone Encounter (Signed)
[name] called a critical WBC - 19.1, results given to Dr Silvio Pate.

## 2017-12-27 NOTE — Telephone Encounter (Signed)
Known CLL---this is fairly stable

## 2018-01-11 ENCOUNTER — Ambulatory Visit: Payer: Self-pay | Admitting: Family

## 2018-02-01 ENCOUNTER — Encounter: Payer: Self-pay | Admitting: Family

## 2018-02-01 ENCOUNTER — Ambulatory Visit: Payer: Medicare Other | Attending: Family | Admitting: Family

## 2018-02-01 VITALS — BP 154/57 | HR 68 | Resp 18 | Ht 64.0 in | Wt 165.2 lb

## 2018-02-01 DIAGNOSIS — F17210 Nicotine dependence, cigarettes, uncomplicated: Secondary | ICD-10-CM | POA: Diagnosis not present

## 2018-02-01 DIAGNOSIS — E785 Hyperlipidemia, unspecified: Secondary | ICD-10-CM | POA: Diagnosis not present

## 2018-02-01 DIAGNOSIS — R600 Localized edema: Secondary | ICD-10-CM | POA: Insufficient documentation

## 2018-02-01 DIAGNOSIS — I739 Peripheral vascular disease, unspecified: Secondary | ICD-10-CM | POA: Insufficient documentation

## 2018-02-01 DIAGNOSIS — Z79899 Other long term (current) drug therapy: Secondary | ICD-10-CM | POA: Insufficient documentation

## 2018-02-01 DIAGNOSIS — G4700 Insomnia, unspecified: Secondary | ICD-10-CM | POA: Diagnosis not present

## 2018-02-01 DIAGNOSIS — Z85820 Personal history of malignant melanoma of skin: Secondary | ICD-10-CM | POA: Insufficient documentation

## 2018-02-01 DIAGNOSIS — I10 Essential (primary) hypertension: Secondary | ICD-10-CM

## 2018-02-01 DIAGNOSIS — I872 Venous insufficiency (chronic) (peripheral): Secondary | ICD-10-CM | POA: Diagnosis not present

## 2018-02-01 DIAGNOSIS — F419 Anxiety disorder, unspecified: Secondary | ICD-10-CM | POA: Diagnosis not present

## 2018-02-01 DIAGNOSIS — R0602 Shortness of breath: Secondary | ICD-10-CM | POA: Diagnosis not present

## 2018-02-01 DIAGNOSIS — R05 Cough: Secondary | ICD-10-CM | POA: Diagnosis not present

## 2018-02-01 DIAGNOSIS — I5032 Chronic diastolic (congestive) heart failure: Secondary | ICD-10-CM | POA: Insufficient documentation

## 2018-02-01 DIAGNOSIS — Z720 Tobacco use: Secondary | ICD-10-CM

## 2018-02-01 DIAGNOSIS — I714 Abdominal aortic aneurysm, without rupture: Secondary | ICD-10-CM | POA: Diagnosis not present

## 2018-02-01 DIAGNOSIS — G4709 Other insomnia: Secondary | ICD-10-CM

## 2018-02-01 DIAGNOSIS — I110 Hypertensive heart disease with heart failure: Secondary | ICD-10-CM | POA: Insufficient documentation

## 2018-02-01 MED ORDER — TORSEMIDE 20 MG PO TABS: 40.0000 mg | ORAL_TABLET | ORAL | 3 refills | Status: DC

## 2018-02-01 NOTE — Progress Notes (Signed)
Patient ID: Tiffany [place], female    DOB: 1933/10/17, 83 y.o.   MRN: [id number]  HPI  Tiffany Martinez is [place] 84 y/o female with Martinez history of AAA, CLL, hyperlipidemia, HTN, current tobacco use and chronic heart failure.   Echo report from 09/30/17 reviewed and showed an EF of 60-65% along with mild AR and Martinez mildly increased PA pressure of 43 mm Hg. Stress echo report from 03/26/10 reviewed and showed an EF of 60%.  Was in the ED 08/24/17 due to HTN along with edema. Medications adjusted and she was released.  She presents today for Martinez follow-up visit with Martinez chief complaint of minimal shortness of breath upon moderate exertion. She describes this as chronic in nature having been present for several years. She does feel like it has improved. She has associated appetite change, taste disturbance, fatigue, cough, pedal edema, abdominal distention and chronic difficulty sleeping along with this. She denies any palpitations, chest pain, dizziness or weight gain. She feels like her furosemide isn't working as well as it had previously been working and was wondering if changing her diuretic would be an option.   Past Medical History:  Diagnosis Date  . AAA (abdominal aortic aneurysm) ([place]) 2012   3.9cm, rec rpt 1 yr  . Anemia   . Anxiety   . CHF (congestive heart failure) ([place])   . Chronic venous insufficiency   . CLL (chronic lymphocytic leukemia) ([place]) 2014   Stage 0--by flow cytometry  . Hx of colonoscopy   . Hyperlipidemia   . Hypertension   . Melanoma ([place])    Right Arm  . PAD (peripheral artery disease) ([place]) 2012   severe R common iliac stenosis, ABI 0.73, mid L common iliac stenosis ABI 1.1  . PAD (peripheral artery disease) ([place])    Past Surgical History:  Procedure Laterality Date  . ABDOMINAL AORTIC ANEURYSM REPAIR  02/16/12   [facility]--Tiffany Martinez  . CARDIOVASCULAR STRESS TEST  2008   negative  . DOBUTAMINE STRESS ECHO  11/11   negative  . KNEE ARTHROSCOPY  1998   right  . MELANOMA  EXCISION  11/09/10   wide excision right upper arm  . SQUAMOUS CELL CARCINOMA EXCISION  02/2016  . TONSILLECTOMY AND ADENOIDECTOMY  1960   Family History  Problem Relation Age of Onset  . Alzheimer's disease Mother   . Pancreatic cancer Father   . Parkinson's disease Brother   . Coronary artery disease Neg Hx   . Diabetes Neg Hx   . Cancer Neg Hx        breast or colon   Social History   Tobacco Use  . Smoking status: Former Smoker    Types: Cigarettes    Last attempt to quit: 08/15/2017    Years since quitting: 0.4  . Smokeless tobacco: Never Used  Substance Use Topics  . Alcohol use: Yes    Alcohol/week: 0.0 standard drinks    Comment: regular wine with dinner   Allergies  Allergen Reactions  . Triamterene Hives   Prior to Admission medications   Medication Sig Start Date End Date Taking? Authorizing Provider  amLODipine (NORVASC) 5 MG tablet Take 1 tablet (5 mg total) by mouth daily. 09/07/17 09/07/18 Yes Tiffany [place], Tiffany Kluver A, FNP  atorvastatin (LIPITOR) 20 MG tablet Take 1 tablet (20 mg total) by mouth daily. 09/07/17  Yes Tiffany [place], Tiffany Kluver A, FNP  furosemide (LASIX) 40 MG tablet Take 1 tablet by mouth every other day. 10/05/17  Yes [provider]  Review of Systems  Constitutional: Positive for appetite change (decreased) and fatigue.  Eyes: Negative.   Respiratory: Positive for cough and shortness of breath (much better). Negative for chest tightness.   Cardiovascular: Positive for leg swelling. Negative for chest pain and palpitations.  Gastrointestinal: Positive for abdominal distention. Negative for abdominal pain.  Endocrine: Negative.   Genitourinary: Negative.   Musculoskeletal: Negative for back pain and neck pain.  Skin: Negative.   Allergic/Immunologic: Negative.   Neurological: Negative for dizziness and light-headedness.  Hematological: Negative for adenopathy. Does not bruise/bleed easily.  Psychiatric/Behavioral: Positive for sleep disturbance (not  sleeping well;sleeping on 2 pillows). Negative for dysphoric mood. The patient is not nervous/anxious.    Vitals:   02/01/18 1322  BP: (!) 154/57  Pulse: 68  Resp: 18  SpO2: 100%  Weight: 165 lb 4 oz (75 kg)  Height: 5\' 4"  (1.626 m)   Wt Readings from Last 3 Encounters:  02/01/18 165 lb 4 oz (75 kg)  12/26/17 166 lb (75.3 kg)  09/26/17 168 lb 4 oz (76.3 kg)   Lab Results  Component Value Date   CREATININE 1.31 (H) 12/26/2017   CREATININE 1.06 (H) 08/24/2017   CREATININE 1.51 (H) 09/16/2016    Physical Exam  Constitutional: She is oriented to person, place, and time. She appears well-developed and well-nourished.  HENT:  Head: Normocephalic and atraumatic.  Neck: Normal range of motion. Neck supple. No JVD present.  Cardiovascular: Normal rate and regular rhythm.  Pulmonary/Chest: Effort normal. She has no wheezes. She has no rales.  Abdominal: Soft. She exhibits no distension. There is no tenderness.  Musculoskeletal: She exhibits no edema or tenderness.  Neurological: She is alert and oriented to person, place, and time.  Skin: Skin is warm and dry.  Psychiatric: She has [place] normal mood and affect. Her behavior is normal. Thought content normal.  Nursing note and vitals reviewed.  Assessment & Plan:  1: Chronic heart failure with preserved ejection fraction- - NYHA class II - euvolemic today - weighing daily. Reminde to call for an overnight weight gain of >2 pounds or Martinez weekly weight gain of >5 pounds - weight down 3 pounds since she was last here 4 months ago - not adding salt and has been reading food labels. Reminded to closely follow Martinez 2000mg  sodium diet - notices that her ability to taste foods has declined which has prompted Martinez decreased appetite where she sometimes doesn't want to eat because the food has no taste - advised her to stop the furosemide and begin torsemide 40mg  every other day and will see if she responds better to that diuretic. Will check BMP at her  next visit since changing diuretic today - BNP 08/24/17 was 200.0  2: HTN- - BP mildly elevated today; changing diuretic per above - saw PCP Tiffany [place]) 12/26/17 - BMP on 12/26/17 reviewed and showed sodium 140, potassium 4.2, creatinine 1.31 and GFR 41.33  3: Tobacco use- - smokes 3 cigarettes daily - complete cessation discussed for 3 minutes with her  4: Insomnia- - patient mentions that this is Martinez chronic problem - encouraged her to speak with PCP about this  Medication bottles were reviewed  Return in 2 weeks or sooner for any questions/problems before then.

## 2018-02-01 NOTE — Patient Instructions (Signed)
Continue weighing daily and call for an overnight weight gain of > 2 pounds or a weekly weight gain of >5 pounds. 

## 2018-02-02 ENCOUNTER — Encounter: Payer: Self-pay | Admitting: Family

## 2018-02-02 DIAGNOSIS — G4700 Insomnia, unspecified: Secondary | ICD-10-CM | POA: Insufficient documentation

## 2018-02-16 NOTE — Progress Notes (Signed)
Patient ID: Tiffany [place], female    DOB: 1934-01-09, 84 y.o.   MRN: [id number]  HPI  Tiffany Martinez is [place] 84 y/o female with Martinez history of AAA, CLL, hyperlipidemia, HTN, current tobacco use and chronic heart failure.   Echo report from 09/30/17 reviewed and showed an EF of 60-65% along with mild AR and [place] mildly increased PA pressure of 43 mm Hg. Stress echo report from 03/26/10 reviewed and showed an EF of 60%.  Was in the ED 08/24/17 due to HTN along with edema. Medications adjusted and she was released.  She presents today for [place] follow-up visit with Martinez chief complaint of minimal shortness of breath upon moderate exertion. She describes this as chronic in nature having been present for several years. She says that this has improved since her diuretic was changed to torsemide at her last visit. She has associated fatigue, cough, pedal edema, abdominal distention and difficulty sleeping along with this. He denies palpitations, chest pain, dizziness or weight gain.   Past Medical History:  Diagnosis Date  . AAA (abdominal aortic aneurysm) (T[place]) 2012   3.9cm, rec rpt 1 yr  . Anemia   . Anxiety   . CHF (congestive heart failure) (Tiffany [place])   . Chronic venous insufficiency   . CLL (chronic lymphocytic leukemia) ([place]) 2014   Stage 0--by flow cytometry  . Hx of colonoscopy   . Hyperlipidemia   . Hypertension   . Melanoma (Tiffany [place])    Right Arm  . PAD (peripheral artery disease) ([place]iffany [place]) 2012   severe R common iliac stenosis, ABI 0.73, mid L common iliac stenosis ABI 1.1  . PAD (peripheral artery disease) (Tiffany [place])    Past Surgical History:  Procedure Laterality Date  . ABDOMINAL AORTIC ANEURYSM REPAIR  02/16/12   [facility]--Dr Sammuel Hines  . CARDIOVASCULAR STRESS TEST  2008   negative  . DOBUTAMINE STRESS ECHO  11/11   negative  . KNEE ARTHROSCOPY  1998   right  . MELANOMA EXCISION  11/09/10   wide excision right upper arm  . SQUAMOUS CELL CARCINOMA EXCISION  02/2016  . TONSILLECTOMY AND ADENOIDECTOMY  1960    Family History  Problem Relation Age of Onset  . Alzheimer's disease Mother   . Pancreatic cancer Father   . Parkinson's disease Brother   . Coronary artery disease Neg Hx   . Diabetes Neg Hx   . Cancer Neg Hx        breast or colon   Social History   Tobacco Use  . Smoking status: Former Smoker    Types: Cigarettes    Last attempt to quit: 08/15/2017    Years since quitting: 0.5  . Smokeless tobacco: Never Used  Substance Use Topics  . Alcohol use: Yes    Alcohol/week: 0.0 standard drinks    Comment: regular wine with dinner   Allergies  Allergen Reactions  . Triamterene Hives   Prior to Admission medications   Medication Sig Start Date End Date Taking? Authorizing Provider  amLODipine (NORVASC) 5 MG tablet Take 1 tablet (5 mg total) by mouth daily. 09/07/17 09/07/18 Yes Hackney, Otila Kluver A, FNP  atorvastatin (LIPITOR) 20 MG tablet Take 1 tablet (20 mg total) by mouth daily. 09/07/17  Yes Tiffany Price A, FNP  torsemide (DEMADEX) 20 MG tablet Take 2 tablets (40 mg total) by mouth every other day. Patient taking differently: Take 20 mg by mouth every other day.  02/01/18 05/02/18 Yes Tiffany Graff, FNP    Review of Systems  Constitutional: Positive for appetite change (decreased) and fatigue.  Eyes: Negative.   Respiratory: Positive for cough (dry cough) and shortness of breath (much better). Negative for chest tightness.   Cardiovascular: Positive for leg swelling (better). Negative for chest pain and palpitations.  Gastrointestinal: Positive for abdominal distention. Negative for abdominal pain.  Endocrine: Negative.   Genitourinary: Negative.   Musculoskeletal: Negative for back pain and neck pain.  Skin: Negative.   Allergic/Immunologic: Negative.   Neurological: Negative for dizziness and light-headedness.  Hematological: Negative for adenopathy. Does not bruise/bleed easily.  Psychiatric/Behavioral: Positive for sleep disturbance (not sleeping well;sleeping on 2  pillows). Negative for dysphoric mood. The patient is not nervous/anxious.    Vitals:   02/17/18 1328  BP: (!) 125/92  Pulse: 68  Resp: 18  SpO2: 99%  Weight: 166 lb (75.3 kg)  Height: 5\' 6"  (1.676 m)   Wt Readings from Last 3 Encounters:  02/17/18 166 lb (75.3 kg)  02/01/18 165 lb 4 oz (75 kg)  12/26/17 166 lb (75.3 kg)   Lab Results  Component Value Date   CREATININE 1.31 (H) 12/26/2017   CREATININE 1.06 (H) 08/24/2017   CREATININE 1.51 (H) 09/16/2016    Physical Exam  Constitutional: She is oriented to person, place, and time. She appears well-developed and well-nourished.  HENT:  Head: Normocephalic and atraumatic.  Neck: Normal range of motion. Neck supple. No JVD present.  Cardiovascular: Normal rate and regular rhythm.  Pulmonary/Chest: Effort normal. She has no wheezes. She has no rales.  Abdominal: Soft. She exhibits no distension. There is no tenderness.  Musculoskeletal: She exhibits no edema or tenderness.  Neurological: She is alert and oriented to person, place, and time.  Skin: Skin is warm and dry.  Psychiatric: She has [place] normal mood and affect. Her behavior is normal. Thought content normal.  Nursing note and vitals reviewed.  Assessment & Plan:  1: Chronic heart failure with preserved ejection fraction- - NYHA class II - euvolemic today - weighing daily. Reminde to call for an overnight weight gain of >2 pounds or [place] weekly weight gain of >5 pounds - weight unchanged from last visit from 2 weeks ago - not adding salt and has been reading food labels. Reminded to closely follow Martinez 2000mg  sodium diet - will check BMP today as her diuretic was changed to torsemide at her last visit - BNP 08/24/17 was 200.0  2: HTN- - BP looks good today - saw PCP Tiffany Martinez) 12/26/17 - BMP on 12/26/17 reviewed and showed sodium 140, potassium 4.2, creatinine 1.31 and GFR 41.33  3: Tobacco use- - smokes 3 cigarettes daily - complete cessation discussed for 3 minutes with  her   Medication bottles were reviewed  Return here in 2 months or sooner for any questions/problems before then.

## 2018-02-17 ENCOUNTER — Encounter: Payer: Self-pay | Admitting: Family

## 2018-02-17 ENCOUNTER — Ambulatory Visit: Payer: Medicare Other | Attending: Family | Admitting: Family

## 2018-02-17 VITALS — BP 125/92 | HR 68 | Resp 18 | Ht 66.0 in | Wt 166.0 lb

## 2018-02-17 DIAGNOSIS — R0602 Shortness of breath: Secondary | ICD-10-CM | POA: Insufficient documentation

## 2018-02-17 DIAGNOSIS — I739 Peripheral vascular disease, unspecified: Secondary | ICD-10-CM | POA: Insufficient documentation

## 2018-02-17 DIAGNOSIS — F419 Anxiety disorder, unspecified: Secondary | ICD-10-CM | POA: Insufficient documentation

## 2018-02-17 DIAGNOSIS — I110 Hypertensive heart disease with heart failure: Secondary | ICD-10-CM | POA: Insufficient documentation

## 2018-02-17 DIAGNOSIS — Z720 Tobacco use: Secondary | ICD-10-CM

## 2018-02-17 DIAGNOSIS — E785 Hyperlipidemia, unspecified: Secondary | ICD-10-CM | POA: Insufficient documentation

## 2018-02-17 DIAGNOSIS — F17210 Nicotine dependence, cigarettes, uncomplicated: Secondary | ICD-10-CM | POA: Insufficient documentation

## 2018-02-17 DIAGNOSIS — Z79899 Other long term (current) drug therapy: Secondary | ICD-10-CM | POA: Insufficient documentation

## 2018-02-17 DIAGNOSIS — Z8249 Family history of ischemic heart disease and other diseases of the circulatory system: Secondary | ICD-10-CM | POA: Diagnosis not present

## 2018-02-17 DIAGNOSIS — I5032 Chronic diastolic (congestive) heart failure: Secondary | ICD-10-CM | POA: Diagnosis not present

## 2018-02-17 DIAGNOSIS — I10 Essential (primary) hypertension: Secondary | ICD-10-CM

## 2018-02-17 LAB — BASIC METABOLIC PANEL
ANION GAP: 9 (ref 5–15)
BUN: 23 mg/dL (ref 8–23)
CALCIUM: 9.8 mg/dL (ref 8.9–10.3)
CO2: 27 mmol/L (ref 22–32)
Chloride: 105 mmol/L (ref 98–111)
Creatinine, Ser: 1.35 mg/dL — ABNORMAL HIGH (ref 0.44–1.00)
GFR calc Af Amer: 41 mL/min — ABNORMAL LOW (ref >60)
GFR, EST NON AFRICAN AMERICAN: 36 mL/min — AB (ref >60)
GLUCOSE: 109 mg/dL — AB (ref 70–99)
POTASSIUM: 3.5 mmol/L (ref 3.5–5.1)
SODIUM: 141 mmol/L (ref 135–145)

## 2018-02-17 NOTE — Patient Instructions (Signed)
Continue weighing daily and call for an overnight weight gain of > 2 pounds or a weekly weight gain of >5 pounds. 

## 2018-03-14 ENCOUNTER — Other Ambulatory Visit: Payer: Self-pay | Admitting: Family

## 2018-03-16 ENCOUNTER — Other Ambulatory Visit: Payer: Self-pay | Admitting: Family

## 2018-03-27 ENCOUNTER — Ambulatory Visit: Payer: Self-pay | Admitting: Family

## 2018-04-04 IMAGING — US US PELVIS COMPLETE
1 series · 13 of 25 positions shown · non-contrast
Comparison: CTA abdomen and pelvis earlier today

CLINICAL DATA: Lower abdominal pain for 2 days, left worse than
right. Intermittent pain since [DATE].

EXAM:
TRANSABDOMINAL AND TRANSVAGINAL ULTRASOUND OF PELVIS
DOPPLER ULTRASOUND OF OVARIES
TECHNIQUE: Both transabdominal and transvaginal ultrasound examinations of the
pelvis were performed. Transabdominal technique was performed for
global imaging of the pelvis including uterus, ovaries, adnexal
regions, and pelvic cul-de-sac.
It was necessary to proceed with endovaginal exam following the
transabdominal exam to visualize the uterus, endometrium, and
ovaries. Color and duplex Doppler ultrasound was utilized to
evaluate blood flow to the ovaries.

[Series 1: us pelvis complete · 0.24mm/px · 13 of 116 slices shown]
[im 1/116]
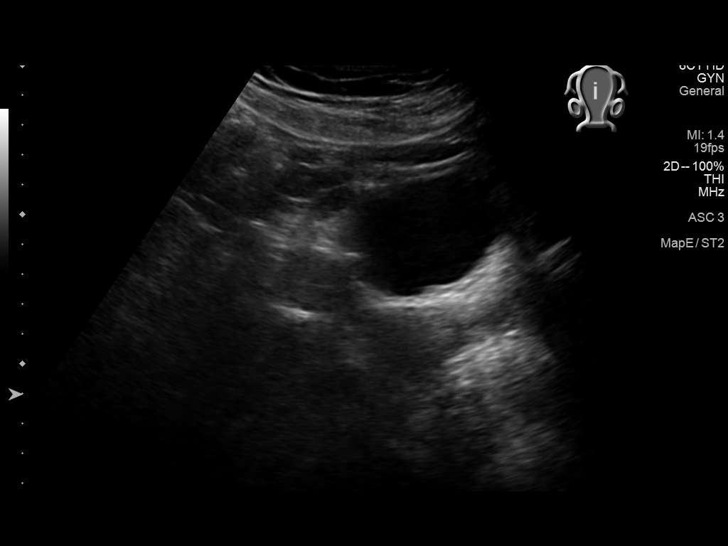
[im 10/116]
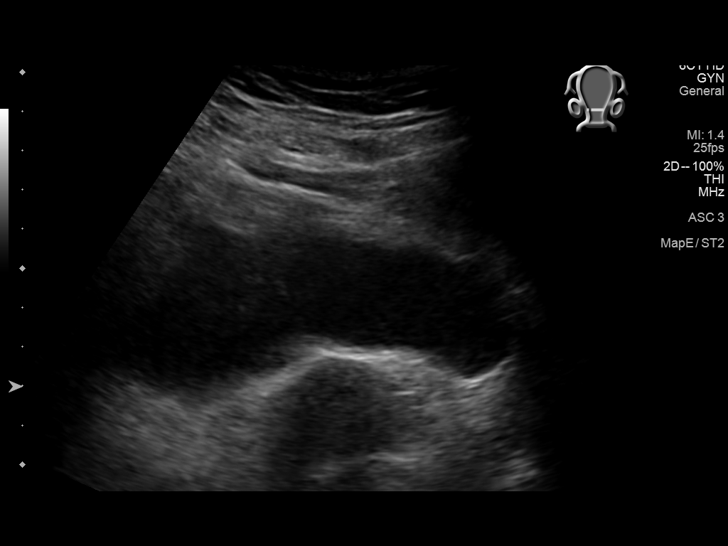
[im 20/116]
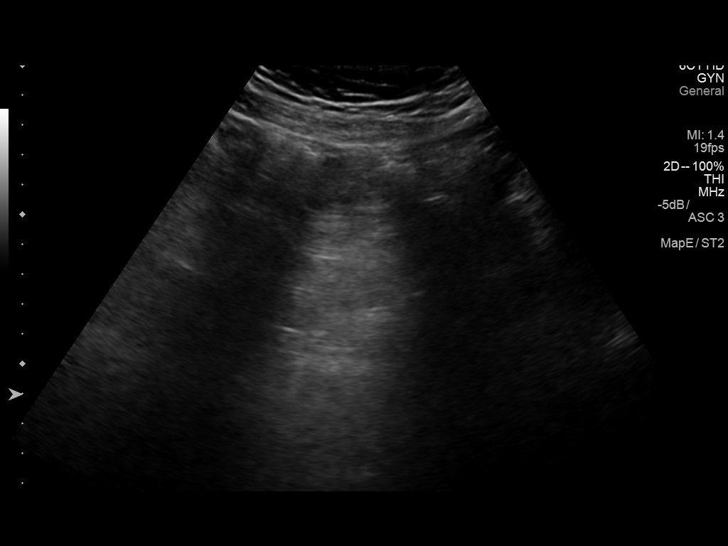
[im 29/116]
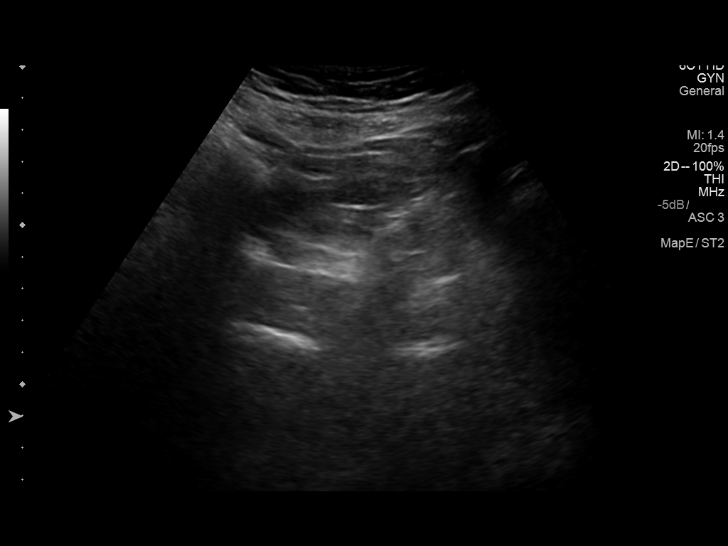
[im 39/116]
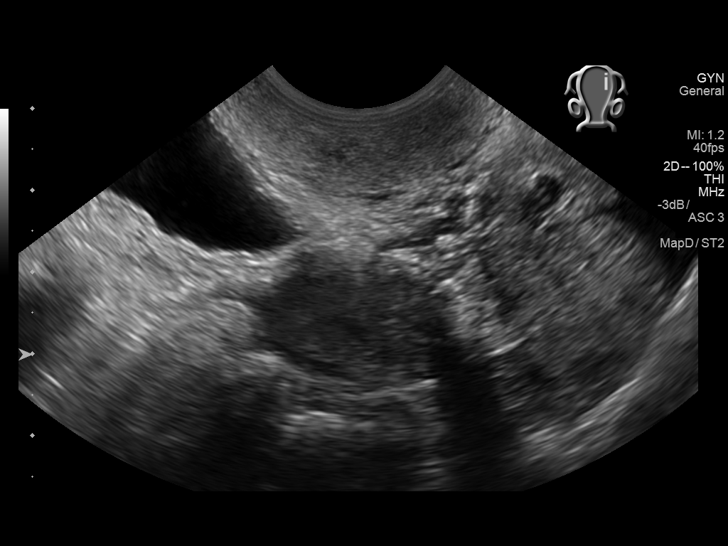
[im 48/116]
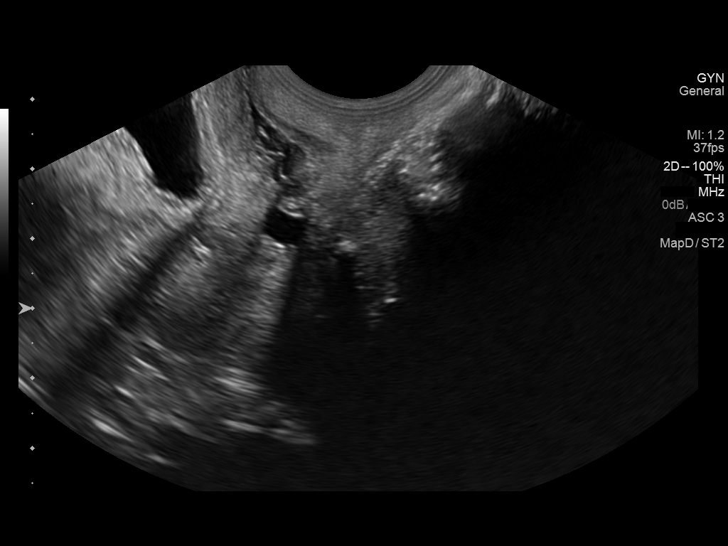
[im 58/116]
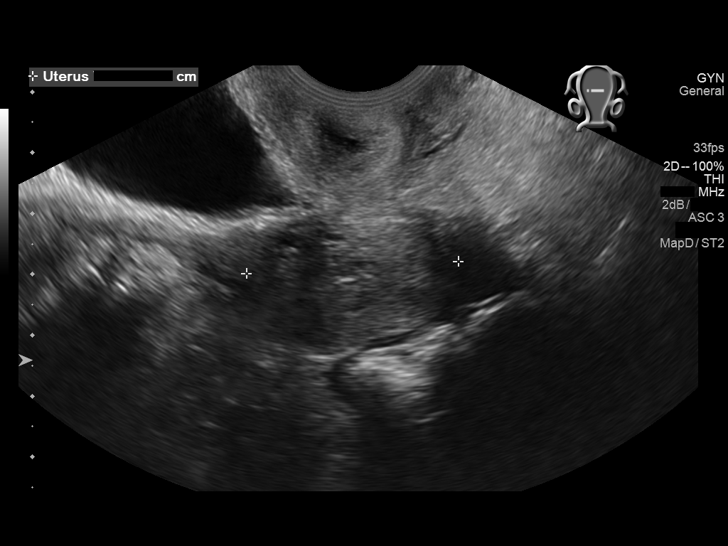
[im 68/116]
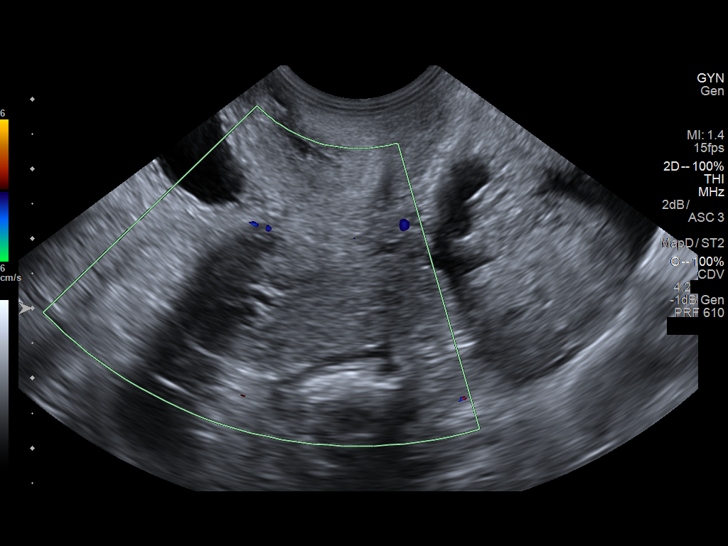
[im 77/116]
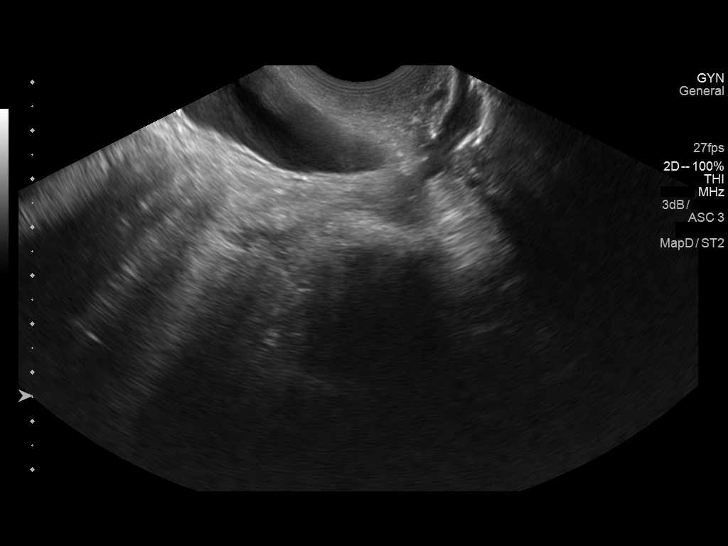
[im 87/116]
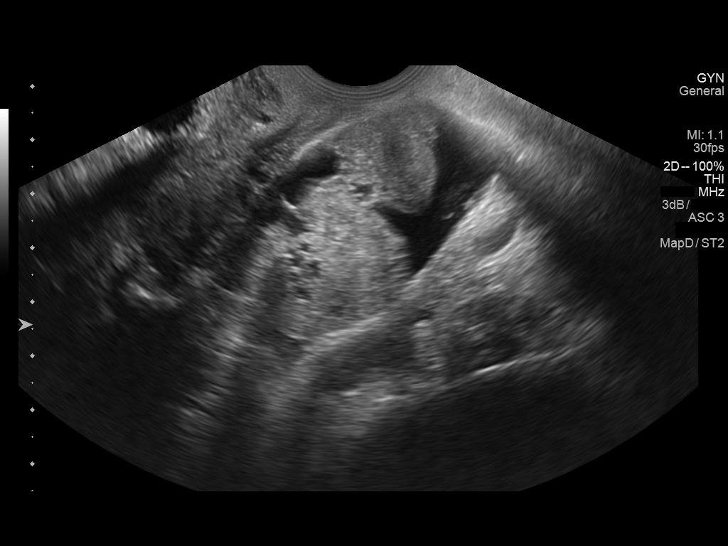
[im 96/116]
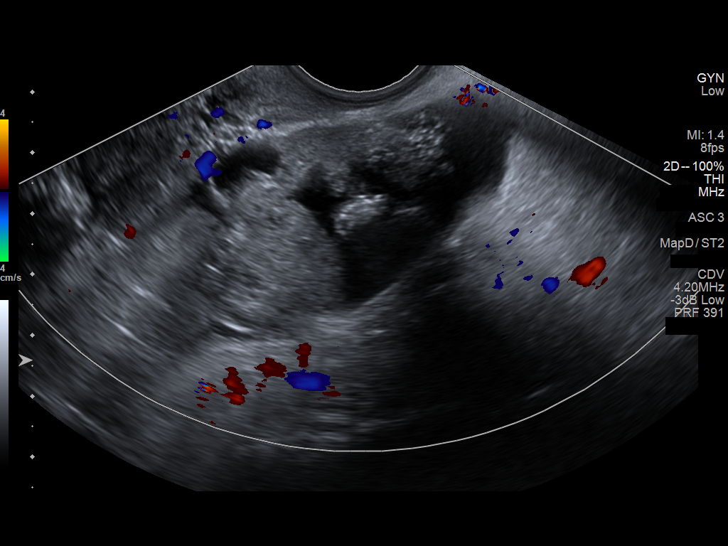
[im 106/116]
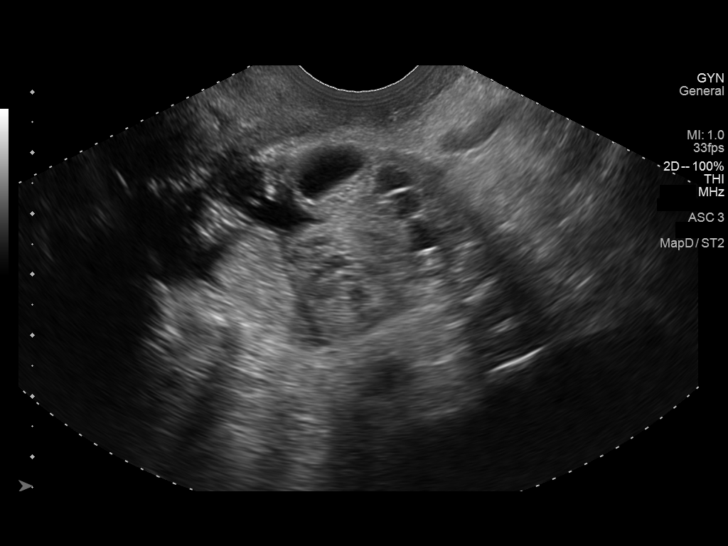
[im 116/116]
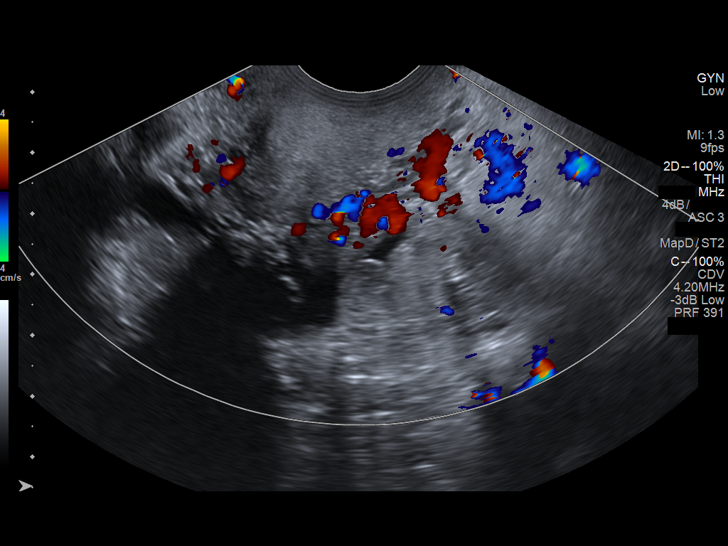

[13 of 25 positions shown; findings below may reference images not displayed]

FINDINGS: Uterus

Measurements: 6.3 x 2.2 x 3.6 cm. Hypoechoic lesion with internal
blood flow in the fundal region on the left measuring 2.3 x 1.6 x
2.5 cm.

Endometrium

Poorly visualized. Heterogeneous appearance at the level of the
cervix which may reflect nabothian cysts.

Right ovary

Not visualized due to bowel gas.

Left ovary

Not clearly identified separate from the below mass.

There is a 6.0 x 3.4 x 6.8 cm complex solid and cystic mass with
echogenic foci/calcification and internal arterial and venous blood
flow in the left adnexa/cul-de-sac.

Other findings

Small amount of free fluid in the adnexa bilaterally.
IMPRESSION: 1. 6.8 cm complex left adnexal mass. This may reflect a pedunculated
subserosal fibroid given its appearance on multiple prior CT
examinations, however an ovarian neoplasm is not excluded. Further
evaluation with nonurgent pelvic MRI is recommended.
2. Possible 2.5 cm fibroid in the uterine fundus.
3. Small volume pelvic free fluid.

## 2018-04-19 ENCOUNTER — Ambulatory Visit: Payer: Self-pay | Admitting: Family

## 2018-05-03 ENCOUNTER — Telehealth: Payer: Self-pay

## 2018-05-03 MED ORDER — TORSEMIDE 20 MG PO TABS: 20.0000 mg | ORAL_TABLET | ORAL | 0 refills | Status: DC

## 2018-05-03 NOTE — Telephone Encounter (Signed)
[name] called to report that she was still experiencing quite a bit of shortness of breath. She states that she also has a cough that does not seem to be going away. She denies any edema but does admit to feeling dizzy in the mornings and  Feeling like she has to go back to bed after breakfast because she is to tired.   She has been taking her medications as directed but does not feel like she is having to urinate more then usual. And she is down 10 lbs since her first visit with [place].   She states that she wanted to check in and give you this information as we may be gone for the holidays.

## 2018-05-03 NOTE — Telephone Encounter (Signed)
Returned phone call to patient regarding her torsemide use. Instructed her to decrease her torsemide to 20mg  every other day (was taking 40mg  every other day). If she develops weight gain of >2 pounds or a weekly weight gain of >5 pounds, she can take an additional 20mg  torsemide.   Patient is in agreement and verbalizes understanding of the plan.

## 2018-06-28 ENCOUNTER — Telehealth: Payer: Self-pay | Admitting: Radiology

## 2018-06-28 ENCOUNTER — Ambulatory Visit (INDEPENDENT_AMBULATORY_CARE_PROVIDER_SITE_OTHER): Payer: Medicare Other | Admitting: Internal Medicine

## 2018-06-28 ENCOUNTER — Encounter: Payer: Self-pay | Admitting: Internal Medicine

## 2018-06-28 VITALS — BP 130/60 | HR 73 | Temp 98.0°F | Ht 66.0 in | Wt 158.0 lb

## 2018-06-28 DIAGNOSIS — I5032 Chronic diastolic (congestive) heart failure: Secondary | ICD-10-CM | POA: Diagnosis not present

## 2018-06-28 DIAGNOSIS — I739 Peripheral vascular disease, unspecified: Secondary | ICD-10-CM

## 2018-06-28 DIAGNOSIS — F39 Unspecified mood [affective] disorder: Secondary | ICD-10-CM

## 2018-06-28 DIAGNOSIS — I700 Atherosclerosis of aorta: Secondary | ICD-10-CM | POA: Diagnosis not present

## 2018-06-28 DIAGNOSIS — C9110 Chronic lymphocytic leukemia of B-cell type not having achieved remission: Secondary | ICD-10-CM

## 2018-06-28 DIAGNOSIS — E876 Hypokalemia: Secondary | ICD-10-CM | POA: Diagnosis not present

## 2018-06-28 DIAGNOSIS — J449 Chronic obstructive pulmonary disease, unspecified: Secondary | ICD-10-CM | POA: Diagnosis not present

## 2018-06-28 DIAGNOSIS — N2581 Secondary hyperparathyroidism of renal origin: Secondary | ICD-10-CM

## 2018-06-28 DIAGNOSIS — N183 Chronic kidney disease, stage 3 unspecified: Secondary | ICD-10-CM

## 2018-06-28 DIAGNOSIS — I714 Abdominal aortic aneurysm, without rupture, unspecified: Secondary | ICD-10-CM

## 2018-06-28 LAB — CBC
HCT: 30.8 % — ABNORMAL LOW (ref 36.0–46.0)
Hemoglobin: 10.2 g/dL — ABNORMAL LOW (ref 12.0–15.0)
MCHC: 33 g/dL (ref 30.0–36.0)
MCV: 104.4 fl — AB (ref 78.0–100.0)
Platelets: 164 10*3/uL (ref 150.0–400.0)
RBC: 2.95 Mil/uL — ABNORMAL LOW (ref 3.87–5.11)
RDW: 17.4 % — AB (ref 11.5–15.5)
WBC: 19.8 10*3/uL (ref 4.0–10.5)

## 2018-06-28 LAB — RENAL FUNCTION PANEL
ALBUMIN: 4.2 g/dL (ref 3.5–5.2)
BUN: 24 mg/dL — AB (ref 6–23)
CHLORIDE: 102 meq/L (ref 96–112)
CO2: 28 meq/L (ref 19–32)
CREATININE: 1.47 mg/dL — AB (ref 0.40–1.20)
Calcium: 9.5 mg/dL (ref 8.4–10.5)
GFR: 34 mL/min — ABNORMAL LOW (ref >60.00)
Glucose, Bld: 117 mg/dL — ABNORMAL HIGH (ref 70–99)
Phosphorus: 3.7 mg/dL (ref 2.3–4.6)
Potassium: 3 mEq/L — ABNORMAL LOW (ref 3.5–5.1)
Sodium: 140 mEq/L (ref 135–145)

## 2018-06-28 LAB — VITAMIN D 25 HYDROXY (VIT D DEFICIENCY, FRACTURES): VITD: 32.8 ng/mL (ref 30.00–100.00)

## 2018-06-28 NOTE — Assessment & Plan Note (Signed)
No peripheral signs Still on statin

## 2018-06-28 NOTE — Assessment & Plan Note (Signed)
Will recheck Inconsistent with vitamin D---will check and PTH

## 2018-06-28 NOTE — Assessment & Plan Note (Signed)
Has stent No recent follow up but no symptoms

## 2018-06-28 NOTE — Assessment & Plan Note (Signed)
Compensated on her diuretic

## 2018-06-28 NOTE — Assessment & Plan Note (Signed)
Chronic dysthymia Does better when she gets out meds not indicated

## 2018-06-28 NOTE — Telephone Encounter (Signed)
Looks like she has CLL with avg wbc around 19  Thanks for the update  Will cc pcp

## 2018-06-28 NOTE — Assessment & Plan Note (Signed)
May go back decades Will recheck labs

## 2018-06-28 NOTE — Assessment & Plan Note (Signed)
Stable now Still not smoking which has probably helped

## 2018-06-28 NOTE — Assessment & Plan Note (Signed)
Mostly chronic bronchitis Stopped smoking
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
## 2018-06-28 NOTE — Telephone Encounter (Signed)
[name] called a critical WBC - 19.8. results given to Dr Glori Bickers

## 2018-06-29 LAB — PARATHYROID HORMONE, INTACT (NO CA): PTH: 110 pg/mL — ABNORMAL HIGH (ref 14–64)

## 2018-06-29 NOTE — Telephone Encounter (Signed)
Yes---this is stable for her

## 2018-06-30 ENCOUNTER — Telehealth: Payer: Self-pay

## 2018-06-30 DIAGNOSIS — N2581 Secondary hyperparathyroidism of renal origin: Secondary | ICD-10-CM | POA: Insufficient documentation

## 2018-06-30 MED ORDER — POTASSIUM CHLORIDE CRYS ER 20 MEQ PO TBCR: 20.0000 meq | EXTENDED_RELEASE_TABLET | Freq: Every day | ORAL | 11 refills | Status: DC

## 2018-06-30 NOTE — Addendum Note (Signed)
Addended by: Viviana Simpler I on: 06/30/2018 01:47 PM   Modules accepted: Orders

## 2018-06-30 NOTE — Assessment & Plan Note (Signed)
Vitamin D normal---but will have her start supplement

## 2018-06-30 NOTE — Telephone Encounter (Signed)
Sent potassium per lab results.

## 2018-07-10 ENCOUNTER — Telehealth: Payer: Self-pay | Admitting: *Deleted

## 2018-07-10 NOTE — Telephone Encounter (Signed)
Tried to call pt . No answer.  

## 2018-07-10 NOTE — Telephone Encounter (Signed)
Please call her We still need to confirm if her potassium is still low--so she should come If it is still low, we can try an over the counter potassium to see if that is better tolerated

## 2018-07-10 NOTE — Telephone Encounter (Signed)
Patient called stating that she wants her lab appointment cancelled for Wednesday.  Patient stated that she had to stop the potassium because of side effects. Patient stated that she only took the potassium for 3 days. Patient stated that she stopped the potassium Sunday because of it causing her dizziness, vomiting and hives. Patient stated that she is doing much better now that she has stopped the potassium.  Lab appointment needs to be cancelled if Dr. Silvio Pate agrees.

## 2018-07-11 NOTE — Telephone Encounter (Signed)
Spoke to pt. She will come to lab visit tomorrow.

## 2018-07-12 ENCOUNTER — Other Ambulatory Visit (INDEPENDENT_AMBULATORY_CARE_PROVIDER_SITE_OTHER): Payer: Medicare Other

## 2018-07-12 DIAGNOSIS — E876 Hypokalemia: Secondary | ICD-10-CM | POA: Diagnosis not present

## 2018-07-12 LAB — RENAL FUNCTION PANEL
ALBUMIN: 4.1 g/dL (ref 3.5–5.2)
BUN: 22 mg/dL (ref 6–23)
CO2: 27 mEq/L (ref 19–32)
CREATININE: 1.46 mg/dL — AB (ref 0.40–1.20)
Calcium: 9.5 mg/dL (ref 8.4–10.5)
Chloride: 105 mEq/L (ref 96–112)
GFR: 34.27 mL/min — ABNORMAL LOW (ref >60.00)
GLUCOSE: 105 mg/dL — AB (ref 70–99)
PHOSPHORUS: 3.8 mg/dL (ref 2.3–4.6)
Potassium: 3.2 mEq/L — ABNORMAL LOW (ref 3.5–5.1)
Sodium: 141 mEq/L (ref 135–145)

## 2018-07-17 ENCOUNTER — Ambulatory Visit: Payer: Self-pay | Admitting: Family

## 2018-08-29 ENCOUNTER — Other Ambulatory Visit: Payer: Self-pay | Admitting: Family

## 2018-09-18 ENCOUNTER — Other Ambulatory Visit: Payer: Self-pay | Admitting: Family

## 2018-10-02 ENCOUNTER — Other Ambulatory Visit: Payer: Self-pay | Admitting: Internal Medicine

## 2018-10-31 ENCOUNTER — Other Ambulatory Visit: Payer: Self-pay | Admitting: Family

## 2018-11-14 ENCOUNTER — Telehealth: Payer: Self-pay

## 2018-11-14 MED ORDER — TORSEMIDE 20 MG PO TABS: 20.0000 mg | ORAL_TABLET | ORAL | 1 refills | Status: DC

## 2018-11-14 MED ORDER — POTASSIUM CHLORIDE CRYS ER 20 MEQ PO TBCR: 20.0000 meq | EXTENDED_RELEASE_TABLET | Freq: Every day | ORAL | 1 refills | Status: DC

## 2018-11-14 MED ORDER — ATORVASTATIN CALCIUM 20 MG PO TABS: ORAL_TABLET | ORAL | 1 refills | Status: DC

## 2018-11-14 MED ORDER — AMLODIPINE BESYLATE 5 MG PO TABS: 5.0000 mg | ORAL_TABLET | Freq: Every day | ORAL | 1 refills | Status: DC

## 2018-11-14 NOTE — Telephone Encounter (Signed)
Spoke to pt. She would like them sent to [facility]. I have taken care of that.

## 2018-11-14 NOTE — Telephone Encounter (Signed)
Pt left v/m requesting all her meds be refilled for # 90. Pt did not leave a pharmacy. I tried to call pt; no answer and no v/m. Pt last seen 6 mth FU on 06/28/18 and scheduled for CPX on 01/01/19.

## 2018-11-17 ENCOUNTER — Emergency Department: Payer: Medicare Other

## 2018-11-17 ENCOUNTER — Emergency Department
Admission: EM | Admit: 2018-11-17 | Discharge: 2018-11-17 | Disposition: A | Discharge status: Home / Self Care | Payer: Medicare Other | Attending: Emergency Medicine | Admitting: Emergency Medicine

## 2018-11-17 ENCOUNTER — Other Ambulatory Visit: Payer: Self-pay

## 2018-11-17 DIAGNOSIS — R52 Pain, unspecified: Secondary | ICD-10-CM | POA: Diagnosis not present

## 2018-11-17 DIAGNOSIS — S59912A Unspecified injury of left forearm, initial encounter: Secondary | ICD-10-CM | POA: Diagnosis present

## 2018-11-17 DIAGNOSIS — R110 Nausea: Secondary | ICD-10-CM | POA: Diagnosis not present

## 2018-11-17 DIAGNOSIS — W010XXA Fall on same level from slipping, tripping and stumbling without subsequent striking against object, initial encounter: Secondary | ICD-10-CM | POA: Insufficient documentation

## 2018-11-17 DIAGNOSIS — S52502A Unspecified fracture of the lower end of left radius, initial encounter for closed fracture: Secondary | ICD-10-CM | POA: Insufficient documentation

## 2018-11-17 DIAGNOSIS — Y9389 Activity, other specified: Secondary | ICD-10-CM | POA: Insufficient documentation

## 2018-11-17 DIAGNOSIS — Y9289 Other specified places as the place of occurrence of the external cause: Secondary | ICD-10-CM | POA: Insufficient documentation

## 2018-11-17 DIAGNOSIS — R42 Dizziness and giddiness: Secondary | ICD-10-CM | POA: Diagnosis not present

## 2018-11-17 DIAGNOSIS — S52532A Colles' fracture of left radius, initial encounter for closed fracture: Secondary | ICD-10-CM | POA: Diagnosis not present

## 2018-11-17 DIAGNOSIS — S52602A Unspecified fracture of lower end of left ulna, initial encounter for closed fracture: Secondary | ICD-10-CM | POA: Diagnosis not present

## 2018-11-17 DIAGNOSIS — Y998 Other external cause status: Secondary | ICD-10-CM | POA: Insufficient documentation

## 2018-11-17 DIAGNOSIS — I10 Essential (primary) hypertension: Secondary | ICD-10-CM | POA: Diagnosis not present

## 2018-11-17 DIAGNOSIS — S52612D Displaced fracture of left ulna styloid process, subsequent encounter for closed fracture with routine healing: Secondary | ICD-10-CM | POA: Diagnosis not present

## 2018-11-17 DIAGNOSIS — R609 Edema, unspecified: Secondary | ICD-10-CM | POA: Diagnosis not present

## 2018-11-17 DIAGNOSIS — S52502D Unspecified fracture of the lower end of left radius, subsequent encounter for closed fracture with routine healing: Secondary | ICD-10-CM | POA: Diagnosis not present

## 2018-11-17 MED ORDER — FENTANYL CITRATE (PF) 100 MCG/2ML IJ SOLN
50.0000 ug | Freq: Once | INTRAMUSCULAR | Status: AC
  Administered 2018-11-17: 18:00:00 100 ug via INTRAVENOUS

## 2018-11-17 MED ORDER — FENTANYL CITRATE (PF) 100 MCG/2ML IJ SOLN
INTRAMUSCULAR | Status: AC
  Administered 2018-11-17: 18:00:00 100 ug via INTRAVENOUS
  Filled 2018-11-17: qty 2

## 2018-11-17 NOTE — ED Provider Notes (Signed)
[facility] Emergency Department Provider Note ____________________________________________   First MD Initiated Contact with Patient 11/17/18 1624     (approximate)  I have reviewed the triage vital signs and the nursing notes.   HISTORY  Chief Complaint Fall    HPI Tiffany Martinez is a 85 y.o. female with PMH as noted below who presents with left forearm injury, acute onset when she had a fall from standing height.  The patient states that it happened about 2 hours ago.  She was painting, and slipped on a tarp that was on the floor.  She denies hitting her head and had no LOC.  She denies any other injuries.  Past Medical History:  Diagnosis Date  . AAA (abdominal aortic aneurysm) ([place]) 2012   3.9cm, rec rpt 1 yr  . Anemia   . Anxiety   . CHF (congestive heart failure) ([place])   . Chronic venous insufficiency   . CLL (chronic lymphocytic leukemia) ([place]) 2014   Stage 0--by flow cytometry  . Hx of colonoscopy   . Hyperlipidemia   . Hypertension   . Melanoma ([place])    Right Arm  . PAD (peripheral artery disease) ([place]) 2012   severe R common iliac stenosis, ABI 0.73, mid L common iliac stenosis ABI 1.1  . PAD (peripheral artery disease) [facility])     Patient Active Problem List   Diagnosis Date Noted  . Secondary hyperparathyroidism of renal origin ([place]) 06/30/2018  . Insomnia 02/02/2018  . COPD (chronic obstructive pulmonary disease) ([place]) 12/26/2017  . Chronic renal disease, stage III ([place]) 12/26/2017  . Aortic atherosclerosis ([place]) 09/14/2017  . CLL (chronic lymphocytic leukemia) ([place]) 09/14/2017  . Chronic diastolic heart failure ([place]) 08/25/2017  . Advanced directives, counseling/discussion 01/15/2014  . AAA (abdominal aortic aneurysm) ([place])   . Routine general medical examination at a health care facility 07/07/2012  . History of melanoma 12/24/2011  . Peripheral arterial disease ([place]) 12/21/2010  . History of squamous cell carcinoma of skin  09/28/2010  . Chronic anemia 04/02/2010  . VENOUS INSUFFICIENCY, CHRONIC 04/02/2010  . HYPERLIPIDEMIA 06/23/2009  . Episodic mood disorder ([place]) 06/23/2009  . Essential hypertension, benign 06/23/2009    Past Surgical History:  Procedure Laterality Date  . ABDOMINAL AORTIC ANEURYSM REPAIR  02/16/12   [facility]--Dr Sammuel Hines  . CARDIOVASCULAR STRESS TEST  2008   negative  . DOBUTAMINE STRESS ECHO  11/11   negative  . KNEE ARTHROSCOPY  1998   right  . MELANOMA EXCISION  11/09/10   wide excision right upper arm  . SQUAMOUS CELL CARCINOMA EXCISION  02/2016  . TONSILLECTOMY AND ADENOIDECTOMY  1960    Prior to Admission medications   Medication Sig Start Date End Date Taking? Authorizing Provider  amLODipine (NORVASC) 5 MG tablet Take 1 tablet (5 mg total) by mouth daily. 11/14/18   Venia Carbon, MD  atorvastatin (LIPITOR) 20 MG tablet TAKE 1 TABLET(20 MG) BY MOUTH DAILY 11/14/18   Viviana Simpler I, MD  potassium chloride SA (K-DUR) 20 MEQ tablet Take 1 tablet (20 mEq total) by mouth daily. 11/14/18   Venia Carbon, MD  torsemide (DEMADEX) 20 MG tablet Take 1 tablet (20 mg total) by mouth every other day. 11/14/18 02/12/19  Venia Carbon, MD    Allergies Triamterene  Family History  Problem Relation Age of Onset  . Alzheimer's disease Mother   . Pancreatic cancer Father   . Parkinson's disease Brother   . Coronary artery disease Neg Hx   .  Diabetes Neg Hx   . Cancer Neg Hx        breast or colon    Social History Social History   Tobacco Use  . Smoking status: Former Smoker    Types: Cigarettes    Quit date: 08/15/2017    Years since quitting: 1.2  . Smokeless tobacco: Never Used  Substance Use Topics  . Alcohol use: Yes    Alcohol/week: 0.0 standard drinks    Comment: regular wine with dinner  . Drug use: No    Review of Systems  Constitutional: No fever. Eyes: No redness. ENT: No neck pain. Cardiovascular: Denies chest pain. Respiratory: Denies shortness  of breath. Gastrointestinal: No vomiting. Genitourinary: Negative for flank pain.  Musculoskeletal: Negative for back pain.  Positive for left arm injury. Skin: Negative for rash. Neurological: Negative for headaches, focal weakness or numbness.   ____________________________________________   PHYSICAL EXAM:  VITAL SIGNS: ED Triage Vitals [11/17/18 1620]  Enc Vitals Group     BP (!) 164/62     Pulse Rate 64     Resp 18     Temp 97.9 F (36.6 C)     Temp Source Oral     SpO2 96 %     Weight 154 lb (69.9 kg)     Height 5\' 4"  (1.626 m)     Head Circumference      Peak Flow      Pain Score 4     Pain Loc      Pain Edu?      Excl. in [place]?    Constitutional: Alert and oriented. Well appearing and in no acute distress. Eyes: Conjunctivae are normal.  Head: Atraumatic. Nose: No congestion/rhinnorhea. Mouth/Throat: Mucous membranes are moist.   Neck: Normal range of motion.  Cardiovascular: Normal rate, regular rhythm. Good peripheral circulation. Respiratory: Normal respiratory effort.  No retractions.  Gastrointestinal: No distention.  Musculoskeletal: Extremities warm and well perfused.  Left forearm with deformity around the mid to distal ulna.  No distal radius tenderness.  2+ radial pulse.  Full range of motion at elbow. Neurologic:  Normal speech and language.  Motor and sensory intact in median, radial, and ulnar distributions in the left hand. Skin:  Skin is warm and dry. No rash noted. Psychiatric: Mood and affect are normal. Speech and behavior are normal.  ____________________________________________   LABS (all labs ordered are listed, but only abnormal results are displayed)  Labs Reviewed - No data to display ____________________________________________  EKG   ____________________________________________  RADIOLOGY  XR L forearm: Distal radius and ulna fracture XR L wrist postreduction: Improved alignment   ____________________________________________   PROCEDURES  Procedure(s) performed: Yes  .Ortho Injury Treatment  Date/Time: 11/17/2018 7:51 PM Performed by: Arta Silence, MD Authorized by: Arta Silence, MD   Consent:    Consent obtained:  Verbal   Consent given by:  Patient   Risks discussed:  Fracture   Alternatives discussed:  ImmobilizationInjury location: wrist Location details: left wrist Injury type: fracture Fracture type: distal radius and ulnar styloid Pre-procedure neurovascular assessment: neurovascularly intact Pre-procedure distal perfusion: normal Pre-procedure neurological function: normal Anesthesia: hematoma block  Anesthesia: Local anesthesia used: yes Local Anesthetic: lidocaine 1% with epinephrine Anesthetic total: 10 mL  Patient sedated: NoManipulation performed: yes Reduction successful: yes X-ray confirmed reduction: yes Immobilization: splint Splint type: sugar tong Post-procedure neurovascular assessment: post-procedure neurovascularly intact Post-procedure distal perfusion: normal Post-procedure neurological function: normal Patient tolerance: patient tolerated the procedure well with no immediate complications  Critical Care performed: No ____________________________________________   INITIAL IMPRESSION / ASSESSMENT AND PLAN / ED COURSE  Pertinent labs & imaging results that were available during my care of the patient were reviewed by me and considered in my medical decision making (see chart for details).  85 year old female presents with left forearm injury after mechanical fall from standing height when she slipped on a painting tarp.  She did not hit her head and denies any other injuries.  She has a deformity of the left forearm but the distal arm is neuro/vascular intact.  We will obtain x-rays and reassess.  ----------------------------------------- 7:50 PM on 11/17/2018  -----------------------------------------  X-ray revealed left distal radius and ulna fracture.  I consulted Dr. Posey Pronto from orthopedics who reviewed the images and recommended reduction and splinting.  Reduction was performed successfully with improvement in fracture alignment.  Per Dr. Serita Grit recommendations, the patient will follow-up next week.  Return precautions and verbal instructions provided and the patient expressed understanding.  She declines any prescription for pain medication.  She is stable for discharge at this time.  ____________________________________________   FINAL CLINICAL IMPRESSION(S) / ED DIAGNOSES  Final diagnoses:  Closed fracture of distal ends of left radius and ulna, initial encounter      NEW MEDICATIONS STARTED DURING THIS VISIT:  New Prescriptions   No medications on file     Note:  This document was prepared using Dragon voice recognition software and may include unintentional dictation errors.    Arta Silence, MD 11/17/18 [id number]

## 2018-11-17 NOTE — ED Notes (Signed)
MD at bedside. EDT Edison Nasuti and Melody at bedside assisting MD and applying splint.

## 2018-11-17 NOTE — Discharge Instructions (Addendum)
Call Dr. Serita Grit office on Monday to arrange for follow-up next week.  Return to the ER for new, worsening, or persistent severe pain, numbness, or any other new or worsening symptoms that concern you.

## 2018-11-17 NOTE — ED Triage Notes (Signed)
Pt comes via [facility] from [facility] after a mechanical fall. Pt states she was painting and got caught in the plastic on the floor and fell. Pt states pain to left arm.  EMS gave 50 of fent and 4 of zofran to pt. Pt reports some dizziness after fall.  Pt arrives A&OX4. Pt has splint in place.

## 2018-11-21 DIAGNOSIS — W010XXA Fall on same level from slipping, tripping and stumbling without subsequent striking against object, initial encounter: Secondary | ICD-10-CM | POA: Diagnosis not present

## 2018-11-21 DIAGNOSIS — S52532A Colles' fracture of left radius, initial encounter for closed fracture: Secondary | ICD-10-CM | POA: Diagnosis not present

## 2018-11-21 DIAGNOSIS — S52615A Nondisplaced fracture of left ulna styloid process, initial encounter for closed fracture: Secondary | ICD-10-CM | POA: Diagnosis not present

## 2018-11-29 DIAGNOSIS — S52532A Colles' fracture of left radius, initial encounter for closed fracture: Secondary | ICD-10-CM | POA: Diagnosis not present

## 2018-12-15 DIAGNOSIS — S52612A Displaced fracture of left ulna styloid process, initial encounter for closed fracture: Secondary | ICD-10-CM | POA: Diagnosis not present

## 2018-12-15 DIAGNOSIS — S52532A Colles' fracture of left radius, initial encounter for closed fracture: Secondary | ICD-10-CM | POA: Diagnosis not present

## 2019-01-01 ENCOUNTER — Encounter: Payer: Self-pay | Admitting: Internal Medicine

## 2019-01-01 ENCOUNTER — Other Ambulatory Visit: Payer: Self-pay

## 2019-01-01 ENCOUNTER — Ambulatory Visit (INDEPENDENT_AMBULATORY_CARE_PROVIDER_SITE_OTHER): Payer: Medicare Other | Admitting: Internal Medicine

## 2019-01-01 VITALS — BP 130/70 | HR 75 | Temp 98.3°F | Ht 64.0 in | Wt 154.0 lb

## 2019-01-01 DIAGNOSIS — Z0000 Encounter for general adult medical examination without abnormal findings: Secondary | ICD-10-CM

## 2019-01-01 DIAGNOSIS — I10 Essential (primary) hypertension: Secondary | ICD-10-CM

## 2019-01-01 DIAGNOSIS — I5032 Chronic diastolic (congestive) heart failure: Secondary | ICD-10-CM | POA: Diagnosis not present

## 2019-01-01 DIAGNOSIS — F39 Unspecified mood [affective] disorder: Secondary | ICD-10-CM

## 2019-01-01 DIAGNOSIS — Z7189 Other specified counseling: Secondary | ICD-10-CM

## 2019-01-01 DIAGNOSIS — J441 Chronic obstructive pulmonary disease with (acute) exacerbation: Secondary | ICD-10-CM | POA: Diagnosis not present

## 2019-01-01 NOTE — Assessment & Plan Note (Signed)
Compensated On the diuretic

## 2019-01-01 NOTE — Assessment & Plan Note (Signed)
Mild but not persistent COVID has worsened but no MDD meds not indicated

## 2019-01-01 NOTE — Assessment & Plan Note (Signed)
I have personally reviewed the Medicare Annual Wellness questionnaire and have noted 1. The patient's medical and social history 2. Their use of alcohol, tobacco or illicit drugs 3. Their current medications and supplements 4. The patient's functional ability including ADL's, fall risks, home safety risks and hearing or visual             impairment. 5. Diet and physical activities 6. Evidence for depression or mood disorders  The patients weight, height, BMI and visual acuity have been recorded in the chart I have made referrals, counseling and provided education to the patient based review of the above and I have provided the pt with a written personalized care plan for preventive services.  I have provided you with a copy of your personalized plan for preventive services. Please take the time to review along with your updated medication list.  She will look into shingrix at pharmacy Flu vaccine soon No cancer screening due to age Discussed exercise Asked her to use walking stick

## 2019-01-01 NOTE — Assessment & Plan Note (Signed)
BP Readings from Last 3 Encounters:  01/01/19 130/70  11/17/18 (!) 165/74  06/28/18 130/60   Good control

## 2019-01-01 NOTE — Progress Notes (Signed)
Subjective:    Patient ID: Tiffany Martinez, female    DOB: Apr 15, 1934, 85 y.o.   MRN: [id number]  HPI Here for Medicare wellness and follow up of chronic health conditions Reviewed form and advanced directives Reviewed other doctors No longer smokes No alcohol Did fall with injury--only fall Vision is okay--has appt for check up in October Hearing is good Some mild chronic mood issues Independent with instrumental ADLs--just monthly cleaning. 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 She turned and caught on the curtain--fell really hard Fracture of left wrist --cast and now splint It has really affected her overall  Has noticed some trouble with her balance When turning and even other times--feels herself leaning off to side Resistant to trying walking stick or cane Doesn't have dizziness  Breathing is about the same No regular cough other than the morning No chest pain No palpitations No edema Monitoring her weight--- has lost some weight Some fatigue ---does get some better in the afternoon  Mild depression still Troubled by the COVID but nothing serious Family all safe in [place] Not anhedonic--but nothing to do for now.  Known CKD  Current Outpatient Medications on File Prior to Visit  Medication Sig Dispense Refill  . amLODipine (NORVASC) 5 MG tablet Take 1 tablet (5 mg total) by mouth daily. 90 tablet 1  . atorvastatin (LIPITOR) 20 MG tablet TAKE 1 TABLET(20 MG) BY MOUTH DAILY 90 tablet 1  . potassium chloride SA (K-DUR) 20 MEQ tablet Take 1 tablet (20 mEq total) by mouth daily. 90 tablet 1  . torsemide (DEMADEX) 20 MG tablet Take 1 tablet (20 mg total) by mouth every other day. 90 tablet 1   No current facility-administered medications on file prior to visit.     Allergies  Allergen Reactions  . Triamterene Hives    Past Medical  History:  Diagnosis Date  . AAA (abdominal aortic aneurysm) ([place]) 2012   3.9cm, rec rpt 1 yr  . Anemia   . Anxiety   . CHF (congestive heart failure) ([place])   . Chronic venous insufficiency   . CLL (chronic lymphocytic leukemia) ([place]) 2014   Stage 0--by flow cytometry  . Hx of colonoscopy   . Hyperlipidemia   . Hypertension   . Melanoma ([place])    Right Arm  . PAD (peripheral artery disease) ([place]) 2012   severe R common iliac stenosis, ABI 0.73, mid L common iliac stenosis ABI 1.1  . PAD (peripheral artery disease) ([place])     Past Surgical History:  Procedure Laterality Date  . ABDOMINAL AORTIC ANEURYSM REPAIR  02/16/12   [facility]--Dr Sammuel Hines  . CARDIOVASCULAR STRESS TEST  2008   negative  . DOBUTAMINE STRESS ECHO  11/11   negative  . KNEE ARTHROSCOPY  1998   right  . MELANOMA EXCISION  11/09/10   wide excision right upper arm  . SQUAMOUS CELL CARCINOMA EXCISION  02/2016  . TONSILLECTOMY AND ADENOIDECTOMY  1960    Family History  Problem Relation Age of Onset  . Alzheimer's disease Mother   . Pancreatic cancer Father   . Parkinson's disease Brother   . Coronary artery disease Neg Hx   . Diabetes Neg Hx   . Cancer Neg Hx        breast or colon    Social History   Socioeconomic History  . Marital status: Widowed    Spouse  name: Not on file  . Number of children: 4  . Years of education: Not on file  . Highest education level: Not on file  Occupational History  . Occupation: retired- Therapist, sports,   Social Needs  . Financial resource strain: Not on file  . Food insecurity    Worry: Not on file    Inability: Not on file  . Transportation needs    Medical: Not on file    Non-medical: Not on file  Tobacco Use  . Smoking status: Former Smoker    Types: Cigarettes    Quit date: 08/15/2017    Years since quitting: 1.3  . Smokeless tobacco: Never Used  Substance and Sexual Activity  . Alcohol use: Yes    Alcohol/week: 0.0 standard drinks    Comment: regular wine with dinner   . Drug use: No  . Sexual activity: Never  Lifestyle  . Physical activity    Days per week: Not on file    Minutes per session: Not on file  . Stress: Not on file  Relationships  . Social Herbalist on phone: Not on file    Gets together: Not on file    Attends religious service: Not on file    Active member of club or organization: Not on file    Attends meetings of clubs or organizations: Not on file    Relationship status: Not on file  . Intimate partner violence    Fear of current or ex partner: Not on file    Emotionally abused: Not on file    Physically abused: Not on file    Forced sexual activity: Not on file  Other Topics Concern  . Not on file  Social History Narrative   Widowed 2/11 -2nd for him , 1st for her. 4 step sons. 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   Requests DNR   Discussed MOST form---but would probably accept IV fluids/antibiotics, etc   Requests no feeding tube         Review of Systems Sleeps well--naps in afternoon also Appetite is okay---drinking a bottle of boost in the morning when she feels weak. This seems to be helping Wears seat belt Teeth are fine-- full dentures Keeps up with dermatologist--no suspicious lesions today Occasional hives No sig back or joint pains No heartburn or dysphagia Bowels are moving well. No blood Voids fine--no incontinence    Objective:   Physical Exam  Constitutional: She is oriented to person, place, and time. She appears well-developed. No distress.  HENT:  Mouth/Throat: Oropharynx is clear and moist. No oropharyngeal exudate.  Neck: No thyromegaly present.  Cardiovascular: Normal rate, regular rhythm, normal heart sounds and intact distal pulses. Exam reveals no gallop.  No murmur heard. Respiratory: Effort normal. No respiratory distress. She has no rales.  Slightly decreased  breath sounds  Faint wheeze but not tight at all  GI: Soft. She exhibits no distension. There is no abdominal tenderness. There is no rebound and no guarding.  Musculoskeletal:        General: No tenderness or edema.  Lymphadenopathy:    She has no cervical adenopathy.  Neurological: She is alert and oriented to person, place, and time.  President--- "Milinda Pointer, Obama, Bush" 100-97- "I'm not good at math" D-l-r-o-w Recall 3/3  Fairly normal gait  Skin: No  rash noted. No erythema.  Psychiatric: She has a normal mood and affect. Her behavior is normal.           Assessment & Plan:

## 2019-01-01 NOTE — Assessment & Plan Note (Signed)
Mild chronic AM cough No problems with breathing

## 2019-01-01 NOTE — Assessment & Plan Note (Signed)
See social history 

## 2019-01-02 ENCOUNTER — Telehealth: Payer: Self-pay | Admitting: Radiology

## 2019-01-02 LAB — RENAL FUNCTION PANEL
Albumin: 4.3 g/dL (ref 3.5–5.2)
BUN: 26 mg/dL — ABNORMAL HIGH (ref 6–23)
CO2: 27 mEq/L (ref 19–32)
Calcium: 9.8 mg/dL (ref 8.4–10.5)
Chloride: 104 mEq/L (ref 96–112)
Creatinine, Ser: 1.39 mg/dL — ABNORMAL HIGH (ref 0.40–1.20)
GFR: 36.22 mL/min — ABNORMAL LOW (ref >60.00)
Glucose, Bld: 122 mg/dL — ABNORMAL HIGH (ref 70–99)
Phosphorus: 3.7 mg/dL (ref 2.3–4.6)
Potassium: 3.5 mEq/L (ref 3.5–5.1)
Sodium: 140 mEq/L (ref 135–145)

## 2019-01-02 LAB — CBC
HCT: 31.9 % — ABNORMAL LOW (ref 36.0–46.0)
Hemoglobin: 10.7 g/dL — ABNORMAL LOW (ref 12.0–15.0)
MCHC: 33.7 g/dL (ref 30.0–36.0)
MCV: 103.7 fl — ABNORMAL HIGH (ref 78.0–100.0)
Platelets: 192 10*3/uL (ref 150.0–400.0)
RBC: 3.07 Mil/uL — ABNORMAL LOW (ref 3.87–5.11)
RDW: 17.6 % — ABNORMAL HIGH (ref 11.5–15.5)
WBC: 22.1 10*3/uL (ref 4.0–10.5)

## 2019-01-02 NOTE — Telephone Encounter (Signed)
[name] called a critical lab, WBC 22.1. Results given to Dr Silvio Pate

## 2019-01-02 NOTE — Telephone Encounter (Signed)
This is not a surprise---long standing CLL

## 2019-01-05 DIAGNOSIS — S52532D Colles' fracture of left radius, subsequent encounter for closed fracture with routine healing: Secondary | ICD-10-CM | POA: Diagnosis not present

## 2019-01-25 DIAGNOSIS — Z23 Encounter for immunization: Secondary | ICD-10-CM | POA: Diagnosis not present

## 2019-02-02 DIAGNOSIS — S52532D Colles' fracture of left radius, subsequent encounter for closed fracture with routine healing: Secondary | ICD-10-CM | POA: Diagnosis not present

## 2019-02-02 DIAGNOSIS — S52612D Displaced fracture of left ulna styloid process, subsequent encounter for closed fracture with routine healing: Secondary | ICD-10-CM | POA: Diagnosis not present

## 2019-02-16 ENCOUNTER — Telehealth: Payer: Self-pay | Admitting: Internal Medicine

## 2019-02-16 MED ORDER — SHINGRIX 50 MCG/0.5ML IM SUSR: 0.5000 mL | Freq: Once | INTRAMUSCULAR | 1 refills | Status: AC

## 2019-02-16 NOTE — Telephone Encounter (Signed)
Rx sent to pharmacy. Spoke to pt.

## 2019-02-16 NOTE — Telephone Encounter (Signed)
Best number [phone]  Pt called stating she needs rx for shingles vaccine sent to [facility] on  [address]  in [place]  Please advise when this has been done

## 2019-02-20 DIAGNOSIS — H2513 Age-related nuclear cataract, bilateral: Secondary | ICD-10-CM | POA: Diagnosis not present

## 2019-03-20 ENCOUNTER — Telehealth: Payer: Self-pay

## 2019-03-20 NOTE — Telephone Encounter (Signed)
Pt notified as instructed and voiced understanding and  Pt will ck with Saralyn Pilar.

## 2019-03-20 NOTE — Telephone Encounter (Signed)
Pt said she is filling out paperwork for [facility] and is making a health care trust and is needing a power of attorney for health care and pt does not have any family in the states and wants to know if Dr Silvio Pate would consider being her POA for health care. Pt request cb.

## 2019-03-20 NOTE — Telephone Encounter (Signed)
Please let her know that I am honored that she would ask but I can't do it (conflict of interest since I am her doctor). She can probably ask someone at [facility] (have her check with Saralyn Pilar)

## 2019-03-29 ENCOUNTER — Other Ambulatory Visit: Payer: Self-pay | Admitting: Family

## 2019-04-03 ENCOUNTER — Telehealth: Payer: Self-pay | Admitting: Internal Medicine

## 2019-04-03 MED ORDER — TORSEMIDE 20 MG PO TABS: 20.0000 mg | ORAL_TABLET | ORAL | 1 refills | Status: DC

## 2019-04-03 NOTE — Telephone Encounter (Signed)
Rx sent electronically.  

## 2019-04-03 NOTE — Telephone Encounter (Signed)
Pt called needing to get a refill on  Torsemide  She stated pharmacy told her to call office to get refill  Pt is out of her meds  [facility] [address]

## 2019-04-10 ENCOUNTER — Other Ambulatory Visit: Payer: Self-pay

## 2019-05-29 ENCOUNTER — Telehealth: Payer: Self-pay

## 2019-05-29 ENCOUNTER — Telehealth: Payer: Self-pay | Admitting: Family

## 2019-05-29 DIAGNOSIS — Z23 Encounter for immunization: Secondary | ICD-10-CM | POA: Diagnosis not present

## 2019-05-29 NOTE — Telephone Encounter (Signed)
Patient called to discuss symptoms that she is concerned about. She says that she's been feeling "off balance" and feels weak at times. She says that her symptoms are worse in the mornings and tend to improve as the day progresses. . She says that in the mornings when she's making her coffee, she feels the need to hang on to something to keep her balance.   She says that she is also confused about things which also concerns her too. She says that she got "turned around" when going to pick something up yesterday evening at a place she's been to numerous times.   Last saw PCP August 2020. Advised patient that she should call her PCP to discuss the above concerns with him. She says that she will call him today. It may be beneficial to check vitamin B and/ or D levels. Could also need neurology referral depending on PCP's findings.

## 2019-05-29 NOTE — Telephone Encounter (Signed)
Pt left v/m requesting cb from [place]. I called pt back and she said she had already spoken with [facility] CMA and has appt to see Dr Silvio Pate on 05/30/19 at 12 noon; nothing further needed.

## 2019-05-30 ENCOUNTER — Ambulatory Visit (INDEPENDENT_AMBULATORY_CARE_PROVIDER_SITE_OTHER): Payer: Medicare Other | Admitting: Internal Medicine

## 2019-05-30 ENCOUNTER — Encounter: Payer: Self-pay | Admitting: Internal Medicine

## 2019-05-30 ENCOUNTER — Other Ambulatory Visit: Payer: Self-pay

## 2019-05-30 ENCOUNTER — Telehealth: Payer: Self-pay

## 2019-05-30 VITALS — BP 148/72 | HR 67 | Temp 98.0°F | Ht 64.0 in | Wt 156.2 lb

## 2019-05-30 DIAGNOSIS — I5032 Chronic diastolic (congestive) heart failure: Secondary | ICD-10-CM | POA: Diagnosis not present

## 2019-05-30 DIAGNOSIS — R531 Weakness: Secondary | ICD-10-CM

## 2019-05-30 DIAGNOSIS — C9110 Chronic lymphocytic leukemia of B-cell type not having achieved remission: Secondary | ICD-10-CM | POA: Diagnosis not present

## 2019-05-30 DIAGNOSIS — N2581 Secondary hyperparathyroidism of renal origin: Secondary | ICD-10-CM | POA: Diagnosis not present

## 2019-05-30 DIAGNOSIS — I10 Essential (primary) hypertension: Secondary | ICD-10-CM | POA: Diagnosis not present

## 2019-05-30 LAB — CBC
HCT: 31.5 % — ABNORMAL LOW (ref 36.0–46.0)
Hemoglobin: 10.6 g/dL — ABNORMAL LOW (ref 12.0–15.0)
MCHC: 33.6 g/dL (ref 30.0–36.0)
MCV: 102.6 fl — ABNORMAL HIGH (ref 78.0–100.0)
Platelets: 170 10*3/uL (ref 150.0–400.0)
RBC: 3.07 Mil/uL — ABNORMAL LOW (ref 3.87–5.11)
RDW: 17.2 % — ABNORMAL HIGH (ref 11.5–15.5)
WBC: 20.3 10*3/uL (ref 4.0–10.5)

## 2019-05-30 LAB — RENAL FUNCTION PANEL
Albumin: 4 g/dL (ref 3.5–5.2)
BUN: 24 mg/dL — ABNORMAL HIGH (ref 6–23)
CO2: 27 mEq/L (ref 19–32)
Calcium: 9.6 mg/dL (ref 8.4–10.5)
Chloride: 104 mEq/L (ref 96–112)
Creatinine, Ser: 1.38 mg/dL — ABNORMAL HIGH (ref 0.40–1.20)
GFR: 36.49 mL/min — ABNORMAL LOW (ref >60.00)
Glucose, Bld: 121 mg/dL — ABNORMAL HIGH (ref 70–99)
Phosphorus: 2.9 mg/dL (ref 2.3–4.6)
Potassium: 3.5 mEq/L (ref 3.5–5.1)
Sodium: 138 mEq/L (ref 135–145)

## 2019-05-30 LAB — T4, FREE: Free T4: 0.84 ng/dL (ref 0.60–1.60)

## 2019-05-30 NOTE — Assessment & Plan Note (Signed)
Not exacerbated but likely cause of her DOE Will recheck labs Takes the diuretic daily

## 2019-05-30 NOTE — Progress Notes (Signed)
Subjective:    Patient ID: Tiffany Martinez, female    DOB: 1933/12/16, 86 y.o.   MRN: [id number]  HPI Here due to problems with balance and confusion  This visit occurred during the SARS-CoV-2 public health emergency.  Safety protocols were in place, including screening questions prior to the visit, additional usage of staff PPE, and extensive cleaning of exam room while observing appropriate contact time as indicated for disinfecting solutions.   Has been going to the grocery store---uses cart Getting tired before she can even put stuff away Yesterday morning--got up to make coffee, and noticed trouble even walking Had to sit and wait a while Did feel better by lunchtime Had COVID vaccine yesterday afternoon---but by then she was feeling better  Than felt better by last night  No dizziness Was having trouble with balance--seems better today Has been taking her meds Still notes sig DOE---tires out quickly Needs to rest even walking to the mailbox  Haven't trouble with memory yesterday morning when exhausted--but better now  Reviewed CLL and hyperparathyroidism Both could be implicated in this  Current Outpatient Medications on File Prior to Visit  Medication Sig Dispense Refill  . amLODipine (NORVASC) 5 MG tablet Take 1 tablet (5 mg total) by mouth daily. 90 tablet 1  . atorvastatin (LIPITOR) 20 MG tablet TAKE 1 TABLET(20 MG) BY MOUTH DAILY 90 tablet 1  . potassium chloride SA (K-DUR) 20 MEQ tablet Take 1 tablet (20 mEq total) by mouth daily. 90 tablet 1  . torsemide (DEMADEX) 20 MG tablet Take 1 tablet (20 mg total) by mouth every other day. 90 tablet 1   No current facility-administered medications on file prior to visit.    Allergies  Allergen Reactions  . Triamterene Hives    Past Medical History:  Diagnosis Date  . AAA (abdominal aortic aneurysm) ([place]) 2012   3.9cm, rec rpt 1 yr  . Anemia   . Anxiety   . CHF (congestive heart failure) ([place])   . Chronic  venous insufficiency   . CLL (chronic lymphocytic leukemia) ([place]) 2014   Stage 0--by flow cytometry  . Hx of colonoscopy   . Hyperlipidemia   . Hypertension   . Melanoma ([place])    Right Arm  . PAD (peripheral artery disease) ([place]) 2012   severe R common iliac stenosis, ABI 0.73, mid L common iliac stenosis ABI 1.1  . PAD (peripheral artery disease) ([place])     Past Surgical History:  Procedure Laterality Date  . ABDOMINAL AORTIC ANEURYSM REPAIR  02/16/12   [facility]--Dr Sammuel Hines  . CARDIOVASCULAR STRESS TEST  2008   negative  . DOBUTAMINE STRESS ECHO  11/11   negative  . KNEE ARTHROSCOPY  1998   right  . MELANOMA EXCISION  11/09/10   wide excision right upper arm  . SQUAMOUS CELL CARCINOMA EXCISION  02/2016  . TONSILLECTOMY AND ADENOIDECTOMY  1960    Family History  Problem Relation Age of Onset  . Alzheimer's disease Mother   . Pancreatic cancer Father   . Parkinson's disease Brother   . Coronary artery disease Neg Hx   . Diabetes Neg Hx   . Cancer Neg Hx        breast or colon    Social History   Socioeconomic History  . Marital status: Widowed    Spouse name: Not on file  . Number of children: 4  . Years of education: Not on file  . Highest education level: Not on file  Occupational History  .  Occupation: retiredCommunity education officer,   Tobacco Use  . Smoking status: Former Smoker    Types: Cigarettes    Quit date: 08/15/2017    Years since quitting: 1.7  . Smokeless tobacco: Never Used  Substance and Sexual Activity  . Alcohol use: Yes    Alcohol/week: 0.0 standard drinks    Comment: regular wine with dinner  . Drug use: No  . Sexual activity: Never  Other Topics Concern  . Not on file  Social History Narrative   Widowed 2/11 -2nd for him , 1st for her. 4 step sons. 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 (alternate would be someone else with his company)   Requests  DNR   Discussed MOST form---but would probably accept IV fluids/antibiotics, etc   Requests no feeding tube         Social Determinants of Health   Financial Resource Strain:   . Difficulty of Paying Living Expenses: Not on file  Food Insecurity:   . Worried About Charity fundraiser in the Last Year: Not on file  . Ran Out of Food in the Last Year: Not on file  Transportation Needs:   . Lack of Transportation (Medical): Not on file  . Lack of Transportation (Non-Medical): Not on file  Physical Activity:   . Days of Exercise per Week: Not on file  . Minutes of Exercise per Session: Not on file  Stress:   . Feeling of Stress : Not on file  Social Connections:   . Frequency of Communication with Friends and Family: Not on file  . Frequency of Social Gatherings with Friends and Family: Not on file  . Attends Religious Services: Not on file  . Active Member of Clubs or Organizations: Not on file  . Attends Archivist Meetings: Not on file  . Marital Status: Not on file  Intimate Partner Violence:   . Fear of Current or Ex-Partner: Not on file  . Emotionally Abused: Not on file  . Physically Abused: Not on file  . Sexually Abused: Not on file   Review of Systems No chest pain Weight stable Feels heart going fast in bed    Objective:   Physical Exam  Constitutional: No distress.  Orthostatic when getting up of table from supine  Neck: No thyromegaly present.  Cardiovascular: Normal rate, regular rhythm and normal heart sounds. Exam reveals no gallop.  No murmur heard. Respiratory: Effort normal and breath sounds normal. No respiratory distress. She has no wheezes. She has no rales.  Musculoskeletal:        General: No edema.     Comments: Varus in knees--seems to be reason for slight swaying when walking  Lymphadenopathy:    She has no cervical adenopathy.  Neurological: She has normal strength. No cranial nerve deficit. She exhibits normal muscle tone. She  displays a negative Romberg sign. Coordination and gait normal.  Sways slightly with walking but not ataxic  Psychiatric: She has a normal mood and affect. Her behavior is normal.           Assessment & Plan:

## 2019-05-30 NOTE — Assessment & Plan Note (Signed)
Will recheck her labs to be sure this isn't worse

## 2019-05-30 NOTE — Assessment & Plan Note (Signed)
BP Readings from Last 3 Encounters:  05/30/19 (!) 148/72  01/01/19 130/70  11/17/18 (!) 165/74   Runs high at times---but tends to low diastolic and was clinically orthostatic here Would not increase meds therefore

## 2019-05-30 NOTE — Assessment & Plan Note (Signed)
WBC typically in the 20K area Will recheck given weakness episode

## 2019-05-30 NOTE — Telephone Encounter (Signed)
[name] called a critical result @ 1525 of [name] 20.3

## 2019-05-30 NOTE — Assessment & Plan Note (Addendum)
Bad spell yesterday morning that is much better Not clearly consistent with TIA No neuro findings of concern today (I had been wondering about stroke, NPH, etc) Will hold off on imaging at this point Reevaluate if recurs Discussed the importance of eating well--she hasn't been

## 2019-05-30 NOTE — Telephone Encounter (Signed)
Known CLL.

## 2019-05-31 LAB — PARATHYROID HORMONE, INTACT (NO CA): PTH: 55 pg/mL (ref 14–64)

## 2019-06-21 DIAGNOSIS — H2513 Age-related nuclear cataract, bilateral: Secondary | ICD-10-CM | POA: Diagnosis not present

## 2019-06-26 DIAGNOSIS — Z23 Encounter for immunization: Secondary | ICD-10-CM | POA: Diagnosis not present

## 2019-07-04 ENCOUNTER — Ambulatory Visit: Payer: Medicare Other | Admitting: Internal Medicine

## 2019-07-04 DIAGNOSIS — H2512 Age-related nuclear cataract, left eye: Secondary | ICD-10-CM | POA: Diagnosis not present

## 2019-07-04 DIAGNOSIS — I509 Heart failure, unspecified: Secondary | ICD-10-CM | POA: Diagnosis not present

## 2019-07-09 ENCOUNTER — Encounter: Payer: Self-pay | Admitting: Ophthalmology

## 2019-07-09 ENCOUNTER — Other Ambulatory Visit: Payer: Self-pay

## 2019-07-09 NOTE — Anesthesia Preprocedure Evaluation (Addendum)
Anesthesia Evaluation  Patient identified by MRN, date of birth, ID band Patient awake    Reviewed: Allergy & Precautions, NPO status , Patient's Chart, lab work & pertinent test results  History of Anesthesia Complications Negative for: history of anesthetic complications  Airway Mallampati: III  TM Distance: >3 FB Neck ROM: Full    Dental  (+) Upper Dentures, Lower Dentures   Pulmonary COPD, Current Smoker and Patient abstained from smoking., former smoker,    breath sounds clear to auscultation       Cardiovascular hypertension, (-) angina+ Peripheral Vascular Disease (AAA) and +CHF (Diastolic, EF 38-88%)  (-) DOE  Rhythm:Regular Rate:Normal   HLD   Neuro/Psych PSYCHIATRIC DISORDERS Anxiety    GI/Hepatic neg GERD  ,  Endo/Other    Renal/GU CRFRenal disease     Musculoskeletal   Abdominal   Peds  Hematology  (+) anemia ,   Anesthesia Other Findings CLL Melanoma  Reproductive/Obstetrics                            Anesthesia Physical Anesthesia Plan  ASA: III  Anesthesia Plan: MAC   Post-op Pain Management:    Induction: Intravenous  PONV Risk Score and Plan: 2 and TIVA, Midazolam and Treatment may vary due to age or medical condition  Airway Management Planned: Nasal Cannula  Additional Equipment:   Intra-op Plan:   Post-operative Plan:   Informed Consent: I have reviewed the patients History and Physical, chart, labs and discussed the procedure including the risks, benefits and alternatives for the proposed anesthesia with the patient or authorized representative who has indicated his/her understanding and acceptance.       Plan Discussed with: CRNA and Anesthesiologist  Anesthesia Plan Comments:         Anesthesia Quick Evaluation

## 2019-07-12 ENCOUNTER — Other Ambulatory Visit: Payer: Self-pay

## 2019-07-12 ENCOUNTER — Other Ambulatory Visit
Admission: RE | Admit: 2019-07-12 | Discharge: 2019-07-12 | Disposition: A | Discharge status: Home / Self Care | Payer: Medicare Other | Source: Ambulatory Visit | Attending: Ophthalmology | Admitting: Ophthalmology

## 2019-07-12 DIAGNOSIS — Z20822 Contact with and (suspected) exposure to covid-19: Secondary | ICD-10-CM | POA: Diagnosis not present

## 2019-07-12 DIAGNOSIS — Z01812 Encounter for preprocedural laboratory examination: Secondary | ICD-10-CM | POA: Insufficient documentation

## 2019-07-12 LAB — SARS CORONAVIRUS 2 (TAT 6-24 HRS): SARS Coronavirus 2: NEGATIVE

## 2019-07-13 NOTE — Discharge Instructions (Signed)

## 2019-07-16 ENCOUNTER — Encounter: Payer: Self-pay | Admitting: Ophthalmology

## 2019-07-16 ENCOUNTER — Other Ambulatory Visit: Payer: Self-pay

## 2019-07-16 ENCOUNTER — Encounter
Admission: RE | Disposition: A | Discharge status: Home / Self Care | Payer: Self-pay | Source: Home / Self Care | Attending: Ophthalmology

## 2019-07-16 ENCOUNTER — Ambulatory Visit: Payer: Medicare Other | Admitting: Anesthesiology

## 2019-07-16 ENCOUNTER — Ambulatory Visit
Admission: RE | Admit: 2019-07-16 | Discharge: 2019-07-16 | Disposition: A | Discharge status: Home / Self Care | Payer: Medicare Other | Attending: Ophthalmology | Admitting: Ophthalmology

## 2019-07-16 DIAGNOSIS — I509 Heart failure, unspecified: Secondary | ICD-10-CM | POA: Insufficient documentation

## 2019-07-16 DIAGNOSIS — Z85820 Personal history of malignant melanoma of skin: Secondary | ICD-10-CM | POA: Insufficient documentation

## 2019-07-16 DIAGNOSIS — Z856 Personal history of leukemia: Secondary | ICD-10-CM | POA: Insufficient documentation

## 2019-07-16 DIAGNOSIS — E7800 Pure hypercholesterolemia, unspecified: Secondary | ICD-10-CM | POA: Diagnosis not present

## 2019-07-16 DIAGNOSIS — H2512 Age-related nuclear cataract, left eye: Secondary | ICD-10-CM | POA: Insufficient documentation

## 2019-07-16 DIAGNOSIS — E785 Hyperlipidemia, unspecified: Secondary | ICD-10-CM | POA: Insufficient documentation

## 2019-07-16 DIAGNOSIS — I714 Abdominal aortic aneurysm, without rupture: Secondary | ICD-10-CM | POA: Diagnosis not present

## 2019-07-16 DIAGNOSIS — J449 Chronic obstructive pulmonary disease, unspecified: Secondary | ICD-10-CM | POA: Diagnosis not present

## 2019-07-16 DIAGNOSIS — I110 Hypertensive heart disease with heart failure: Secondary | ICD-10-CM | POA: Insufficient documentation

## 2019-07-16 DIAGNOSIS — H25812 Combined forms of age-related cataract, left eye: Secondary | ICD-10-CM | POA: Diagnosis not present

## 2019-07-16 DIAGNOSIS — H2511 Age-related nuclear cataract, right eye: Secondary | ICD-10-CM | POA: Diagnosis not present

## 2019-07-16 HISTORY — PX: CATARACT EXTRACTION W/PHACO: SHX586

## 2019-07-16 HISTORY — DX: Presence of dental prosthetic device (complete) (partial): Z97.2

## 2019-07-16 HISTORY — DX: Migraine, unspecified, not intractable, without status migrainosus: G43.909

## 2019-07-16 SURGERY — PHACOEMULSIFICATION, CATARACT, WITH IOL INSERTION
Anesthesia: Monitor Anesthesia Care | Site: Eye | Laterality: Left

## 2019-07-16 MED ORDER — ONDANSETRON HCL 4 MG/2ML IJ SOLN: 4.0000 mg | Freq: Once | INTRAMUSCULAR | Status: DC | PRN

## 2019-07-16 MED ORDER — TETRACAINE HCL 0.5 % OP SOLN
1.0000 [drp] | OPHTHALMIC | Status: DC | PRN
  Administered 2019-07-16 (×3): 1 [drp] via OPHTHALMIC

## 2019-07-16 MED ORDER — EPINEPHRINE PF 1 MG/ML IJ SOLN
INTRAOCULAR | Status: DC | PRN
  Administered 2019-07-16: 63 mL via OPHTHALMIC

## 2019-07-16 MED ORDER — MOXIFLOXACIN HCL 0.5 % OP SOLN
OPHTHALMIC | Status: DC | PRN
  Administered 2019-07-16: 0.2 mL via OPHTHALMIC

## 2019-07-16 MED ORDER — ACETAMINOPHEN 10 MG/ML IV SOLN: 1000.0000 mg | Freq: Once | INTRAVENOUS | Status: DC | PRN

## 2019-07-16 MED ORDER — SODIUM HYALURONATE 10 MG/ML IO SOLN
INTRAOCULAR | Status: DC | PRN
  Administered 2019-07-16: 0.55 mL via INTRAOCULAR

## 2019-07-16 MED ORDER — LACTATED RINGERS IV SOLN: 100.0000 mL/h | INTRAVENOUS | Status: DC

## 2019-07-16 MED ORDER — LIDOCAINE HCL (PF) 2 % IJ SOLN
INTRAOCULAR | Status: DC | PRN
  Administered 2019-07-16: 1 mL via INTRAOCULAR

## 2019-07-16 MED ORDER — ARMC OPHTHALMIC DILATING DROPS
1.0000 "application " | OPHTHALMIC | Status: DC | PRN
  Administered 2019-07-16 (×3): 1 via OPHTHALMIC

## 2019-07-16 MED ORDER — SODIUM HYALURONATE 23 MG/ML IO SOLN
INTRAOCULAR | Status: DC | PRN
  Administered 2019-07-16: 0.6 mL via INTRAOCULAR

## 2019-07-16 MED ORDER — MIDAZOLAM HCL 2 MG/2ML IJ SOLN
INTRAMUSCULAR | Status: DC | PRN
  Administered 2019-07-16: 1 mg via INTRAVENOUS

## 2019-07-16 MED ORDER — FENTANYL CITRATE (PF) 100 MCG/2ML IJ SOLN
INTRAMUSCULAR | Status: DC | PRN
  Administered 2019-07-16: 50 ug via INTRAVENOUS

## 2019-07-16 SURGICAL SUPPLY — 17 items
CANNULA ANT/CHMB 27GA (MISCELLANEOUS) ×6 IMPLANT
DISSECTOR HYDRO NUCLEUS 50X22 (MISCELLANEOUS) ×3 IMPLANT
GLOVE SURG LX 7.5 STRW (GLOVE) ×2
GLOVE SURG LX STRL 7.5 STRW (GLOVE) ×1 IMPLANT
GLOVE SURG SYN 8.5  E (GLOVE) ×2
GLOVE SURG SYN 8.5 E (GLOVE) ×1 IMPLANT
GOWN STRL REUS W/ TWL LRG LVL3 (GOWN DISPOSABLE) ×2 IMPLANT
GOWN STRL REUS W/TWL LRG LVL3 (GOWN DISPOSABLE) ×4
LENS IOL TECNIS ITEC 24.0 (Intraocular Lens) ×3 IMPLANT
MARKER SKIN DUAL TIP RULER LAB (MISCELLANEOUS) ×3 IMPLANT
PACK DR. KING ARMS (PACKS) ×3 IMPLANT
PACK EYE AFTER SURG (MISCELLANEOUS) ×3 IMPLANT
PACK OPTHALMIC (MISCELLANEOUS) ×3 IMPLANT
SYR 3ML LL SCALE MARK (SYRINGE) ×3 IMPLANT
SYR TB 1ML LUER SLIP (SYRINGE) ×3 IMPLANT
WATER STERILE IRR 250ML POUR (IV SOLUTION) ×3 IMPLANT
WIPE NON LINTING 3.25X3.25 (MISCELLANEOUS) ×3 IMPLANT

## 2019-07-16 NOTE — Transfer of Care (Signed)
Immediate Anesthesia Transfer of Care Note  Patient: Tiffany Martinez, Tiffany Martinez  Procedure(s) Performed: CATARACT EXTRACTION PHACO AND INTRAOCULAR LENS PLACEMENT (IOC) LEFT (Left )  Patient Location: PACU  Anesthesia Type: MAC  Level of Consciousness: awake, alert  and patient cooperative  Airway and Oxygen Therapy: Patient Spontanous Breathing and Patient connected to supplemental oxygen  Post-op Assessment: Post-op Vital signs reviewed, Patient's Cardiovascular Status Stable, Respiratory Function Stable, Patent Airway and No signs of Nausea or vomiting  Post-op Vital Signs: Reviewed and stable  Complications: No apparent anesthesia complications

## 2019-07-16 NOTE — Anesthesia Procedure Notes (Signed)
Procedure Name: MAC Date/Time: 07/16/2019 11:42 AM Performed by: Cameron Ali, CRNA Pre-anesthesia Checklist: Patient identified, Emergency Drugs available, Suction available, Timeout performed and Patient being monitored Patient Re-evaluated:Patient Re-evaluated prior to induction Oxygen Delivery Method: Nasal cannula Placement Confirmation: positive ETCO2

## 2019-07-16 NOTE — Op Note (Signed)
OPERATIVE NOTE  Tiffany Martinez [id number] 07/16/2019   PREOPERATIVE DIAGNOSIS:  Nuclear sclerotic cataract left eye.  H25.12   POSTOPERATIVE DIAGNOSIS:    Nuclear sclerotic cataract left eye.     PROCEDURE:  Phacoemusification with posterior chamber intraocular lens placement of the left eye   LENS:   Implant Name Type Inv. Item Serial No. Manufacturer Lot No. LRB No. Used Action  LENS IOL DIOP 24.0 - [id number] Intraocular Lens LENS IOL DIOP 24.0 [id number] AMO  Left 1 Implanted      Procedure(s): CATARACT EXTRACTION PHACO AND INTRAOCULAR LENS PLACEMENT (IOC) LEFT 8.26  00:46.6 (Left)  PCB00 +24.0   ULTRASOUND TIME: 0 minutes 46 seconds.  CDE 8.26   SURGEON:  Benay Pillow, MD, MPH   ANESTHESIA:  Topical with tetracaine drops augmented with 1% preservative-free intracameral lidocaine.  ESTIMATED BLOOD LOSS: <1 mL   COMPLICATIONS:  None.   DESCRIPTION OF PROCEDURE:  The patient was identified in the holding room and transported to the operating room and placed in the supine position under the operating microscope.  The left eye was identified as the operative eye and it was prepped and draped in the usual sterile ophthalmic fashion.   A 1.0 millimeter clear-corneal paracentesis was made at the 5:00 position. 0.5 ml of preservative-free 1% lidocaine with epinephrine was injected into the anterior chamber.  The anterior chamber was filled with Healon 5 viscoelastic.  A 2.4 millimeter keratome was used to make a near-clear corneal incision at the 2:00 position.  A curvilinear capsulorrhexis was made with a cystotome and capsulorrhexis forceps.  Balanced salt solution was used to hydrodissect and hydrodelineate the nucleus.   Phacoemulsification was then used in stop and chop fashion to remove the lens nucleus and epinucleus.  The remaining cortex was then removed using the irrigation and aspiration handpiece. Healon was then placed into the capsular bag to distend it for lens  placement.  A lens was then injected into the capsular bag.  The remaining viscoelastic was aspirated.   Wounds were hydrated with balanced salt solution.  The anterior chamber was inflated to a physiologic pressure with balanced salt solution.  Intracameral vigamox 0.1 mL undiltued was injected into the eye and a drop placed onto the ocular surface.  No wound leaks were noted.  The patient was taken to the recovery room in stable condition without complications of anesthesia or surgery  Benay Pillow 07/16/2019, 12:07 PM

## 2019-07-16 NOTE — Anesthesia Postprocedure Evaluation (Signed)
Anesthesia Post Note  Patient: Engineer, water  Procedure(s) Performed: CATARACT EXTRACTION PHACO AND INTRAOCULAR LENS PLACEMENT (IOC) LEFT 8.26  00:46.6 (Left Eye)     Patient location during evaluation: PACU Anesthesia Type: MAC Level of consciousness: awake and alert Pain management: pain level controlled Vital Signs Assessment: post-procedure vital signs reviewed and stable Respiratory status: spontaneous breathing, nonlabored ventilation, respiratory function stable and patient connected to nasal cannula oxygen Cardiovascular status: stable and blood pressure returned to baseline Postop Assessment: no apparent nausea or vomiting Anesthetic complications: no    Bj Morlock A  [place] D[place]

## 2019-07-16 NOTE — H&P (Signed)

## 2019-07-17 ENCOUNTER — Encounter: Payer: Self-pay | Admitting: *Deleted

## 2019-07-27 ENCOUNTER — Other Ambulatory Visit: Payer: Self-pay

## 2019-07-27 ENCOUNTER — Encounter: Payer: Self-pay | Admitting: Internal Medicine

## 2019-07-27 ENCOUNTER — Ambulatory Visit (INDEPENDENT_AMBULATORY_CARE_PROVIDER_SITE_OTHER): Payer: Medicare Other | Admitting: Internal Medicine

## 2019-07-27 VITALS — BP 160/70 | HR 74 | Temp 98.0°F | Ht 64.0 in | Wt 154.0 lb

## 2019-07-27 DIAGNOSIS — I5032 Chronic diastolic (congestive) heart failure: Secondary | ICD-10-CM

## 2019-07-27 DIAGNOSIS — J449 Chronic obstructive pulmonary disease, unspecified: Secondary | ICD-10-CM | POA: Diagnosis not present

## 2019-07-27 DIAGNOSIS — F39 Unspecified mood [affective] disorder: Secondary | ICD-10-CM | POA: Diagnosis not present

## 2019-07-27 NOTE — Assessment & Plan Note (Signed)
Ongoing adjustment issues---especially with the COVID isolation No MDD or suicidal ideation No medications indicated

## 2019-07-27 NOTE — Progress Notes (Signed)
Subjective:    Patient ID: Tiffany Martinez, female    DOB: 1933-10-11, 86 y.o.   MRN: [id number]  HPI  Here due to several concerns This visit occurred during the SARS-CoV-2 public health emergency.  Safety protocols were in place, including screening questions prior to the visit, additional usage of staff PPE, and extensive cleaning of exam room while observing appropriate contact time as indicated for disinfecting solutions.   Has lots of concerns Last visit in January Feels her weakness was due to not eating well and weight loss Now trying to eat better and is started to regain some weight now  Noted dark urine Called provider from heart failure clinic and was told to "get a psychiatrist" Did increase her fluids and the urine lightened up Does feel that the seclusion in the past year has affected everyone Big concern is that her after visit summary from the cataracts had suicide awareness information. She was upset about this--but I reassured her there is nothing in her chart about her being at risk or having symptoms Does have mild issues--- past grieving, etc No persistent depressed mood though and no suicidal ideation  Still gets tired Wonders about seeing a cardiologist Also worried about the CLL---had to close down her computer due to scamming, so she can't look up about this Discussed that this goes back many years and is stable. Discussed that there is a small risk of conversion to acute leukemia  Still smoking a little No regular cough Gets phlegm if lying flat Stable DOE--even just walking a while  Current Outpatient Medications on File Prior to Visit  Medication Sig Dispense Refill  . amLODipine (NORVASC) 5 MG tablet Take 1 tablet (5 mg total) by mouth daily. 90 tablet 1  . atorvastatin (LIPITOR) 20 MG tablet TAKE 1 TABLET(20 MG) BY MOUTH DAILY 90 tablet 1  . loratadine (CLARITIN) 10 MG tablet Take 10 mg by mouth daily.    . potassium chloride SA (K-DUR) 20 MEQ  tablet Take 1 tablet (20 mEq total) by mouth daily. 90 tablet 1  . torsemide (DEMADEX) 20 MG tablet Take 1 tablet (20 mg total) by mouth every other day. 90 tablet 1   No current facility-administered medications on file prior to visit.    Allergies  Allergen Reactions  . Triamterene Hives    Past Medical History:  Diagnosis Date  . AAA (abdominal aortic aneurysm) ([place]) 2012   3.9cm, rec rpt 1 yr  . Anemia   . Anxiety   . CHF (congestive heart failure) ([place])   . Chronic venous insufficiency   . CLL (chronic lymphocytic leukemia) ([place]) 2014   Stage 0--by flow cytometry  . Hx of colonoscopy   . Hyperlipidemia   . Hypertension   . Melanoma ([place])    Right Arm  . Migraine headache    3-4X/yr  . PAD (peripheral artery disease) ([place]) 2012   severe R common iliac stenosis, ABI 0.73, mid L common iliac stenosis ABI 1.1  . PAD (peripheral artery disease) ([place])   . Wears dentures    full upper and lower    Past Surgical History:  Procedure Laterality Date  . ABDOMINAL AORTIC ANEURYSM REPAIR  02/16/12   [facility]--Dr Sammuel Hines  . CARDIOVASCULAR STRESS TEST  2008   negative  . CATARACT EXTRACTION W/PHACO Left 07/16/2019   Procedure: CATARACT EXTRACTION PHACO AND INTRAOCULAR LENS PLACEMENT (IOC) LEFT 8.26  00:46.6;  Surgeon: Eulogio Bear, MD;  Location: [facility];  Service: Ophthalmology;  Laterality: Left;  . DOBUTAMINE STRESS ECHO  11/11   negative  . KNEE ARTHROSCOPY  1998   right  . MELANOMA EXCISION  11/09/10   wide excision right upper arm  . SQUAMOUS CELL CARCINOMA EXCISION  02/2016  . TONSILLECTOMY AND ADENOIDECTOMY  1960    Family History  Problem Relation Age of Onset  . Alzheimer's disease Mother   . Pancreatic cancer Father   . Parkinson's disease Brother   . Coronary artery disease Neg Hx   . Diabetes Neg Hx   . Cancer Neg Hx        breast or colon    Social History   Socioeconomic History  . Marital status: Widowed    Spouse name: Not on file    . Number of children: 4  . Years of education: Not on file  . Highest education level: Not on file  Occupational History  . Occupation: retiredCommunity education officer,   Tobacco Use  . Smoking status: Light Tobacco Smoker    Types: Cigarettes    Last attempt to quit: 08/15/2017    Years since quitting: 1.9  . Smokeless tobacco: Never Used  . Tobacco comment: 2-3 cigs/day  Substance and Sexual Activity  . Alcohol use: Yes    Alcohol/week: 7.0 standard drinks    Types: 7 Glasses of wine per week    Comment: regular wine with dinner  . Drug use: No  . Sexual activity: Never  Other Topics Concern  . Not on file  Social History Narrative   Widowed 2/11 -2nd for him , 1st for her. 4 step sons. 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 (alternate would be someone else with his company)   Requests DNR   Discussed MOST form---but would probably accept IV fluids/antibiotics, etc   Requests no feeding tube         Social Determinants of Health   Financial Resource Strain:   . Difficulty of Paying Living Expenses:   Food Insecurity:   . Worried About Charity fundraiser in the Last Year:   . Arboriculturist in the Last Year:   Transportation Needs:   . Film/video editor (Medical):   Marland Kitchen Lack of Transportation (Non-Medical):   Physical Activity:   . Days of Exercise per Week:   . Minutes of Exercise per Session:   Stress:   . Feeling of Stress :   Social Connections:   . Frequency of Communication with Friends and Family:   . Frequency of Social Gatherings with Friends and Family:   . Attends Religious Services:   . Active Member of Clubs or Organizations:   . Attends Archivist Meetings:   Marland Kitchen Marital Status:   Intimate Partner Violence:   . Fear of Current or Ex-Partner:   . Emotionally Abused:   Marland Kitchen Physically Abused:   . Sexually Abused:     Review of Systems Had left  cataract removed---did fairly well with this Sleeps good    Objective:   Physical Exam  Constitutional: She appears well-developed. No distress.  Psychiatric: She has a normal mood and affect. Her behavior is normal.           Assessment & Plan:

## 2019-07-27 NOTE — Assessment & Plan Note (Signed)
She has chronic tiredness No evidence of exacerbation She would like to establish with a local cardiologist (and stop at the CHF clinic) Will set this up

## 2019-07-27 NOTE — Assessment & Plan Note (Signed)
Urged her to stop the cigarettes completely Stable DOE at this point

## 2019-08-12 NOTE — Progress Notes (Signed)
Cardiology Office Note  Date:  08/13/2019   ID:  Tiffany Martinez, DOB Jun 20, 1933, MRN [id number]  PCP:  Venia Carbon, MD   Chief Complaint  Patient presents with  . New Patient (Initial Visit)    CHF-Patient reports SOB; Meds verbally reviewed with patient.    HPI:  86 year-old woman   lower extremity PAD.  severe right common iliac stenosis   infrarenal AAA. 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 occluded at its origin but reconstitutes distally unchanged on CT 2017 Copd/smoker Hx of falls diastolic CHF/hypertensive heart disease Presenting by referral from Dr. Silvio Pate for diastolic CHF  Reports that she was born in [place] and spent much time in [place] and [place]  Echo 09/2017 reviewed in detail Left ventricle: The cavity size was normal. There was mild  concentric hypertrophy. Systolic function was normal. The  estimated ejection fraction was in the range of 60% to 65%. Wall  motion was normal; there were no regional wall motion  abnormalities. Features are consistent with a pseudonormal left  ventricular filling pattern, with concomitant abnormal relaxation  and increased filling pressure (grade 2 diastolic dysfunction).  - Aortic valve: There was mild regurgitation.  - Left atrium: The atrium was mildly dilated.  - Pulmonary arteries: Systolic pressure was mildly increased. PA  peak pressure: 43 mm Hg (S).   In the hospital 08/2017 LE edema, sob Records reviewed sbp 180, was not taking her htn medications Was prescribed norvasc D/c on lasix and norvasc  Prior Doppler studies reviewed Abdominal aortic duplex and ABI's showed severe right common iliac stenosis and an ABI of 0.73 on the right with no significant disease on the left and a normal ABI on that side.  There was also notation of a 3.9 cm infrarenal AAA.  On office visit today reports that she has difficulty walking to the mailbox Has shortness of  breath, weakness Feels that she has some depression, Sedentary over the past year  Lab work reviewed CR 1.38 WBC 20 HBG 10.6, stable Total chol 120, LDL 52 in 2019, on lipitor  She does not check her blood pressure at home Lives at [facility]  Continues to take diuretic daily torsemide 20 mg every other day Renal function mildly elevated but stable Has some lower extremity edema but not much Chronic cough  EKG personally reviewed by myself on todays visit nsr rate 77 bpm LVH   PMH:   has a past medical history of AAA (abdominal aortic aneurysm) ([place]) (2012), Anemia, Anxiety, CHF (congestive heart failure) ([place]), Chronic venous insufficiency, CLL (chronic lymphocytic leukemia) ([place]) (2014), colonoscopy, Hyperlipidemia, Hypertension, Melanoma ([place]), Migraine headache, PAD (peripheral artery disease) ([place]) (2012), PAD (peripheral artery disease) ([place]), and Wears dentures.  PSH:    Past Surgical History:  Procedure Laterality Date  . ABDOMINAL AORTIC ANEURYSM REPAIR  02/16/12   [facility]--Dr Sammuel Hines  . CARDIOVASCULAR STRESS TEST  2008   negative  . CATARACT EXTRACTION W/PHACO Left 07/16/2019   Procedure: CATARACT EXTRACTION PHACO AND INTRAOCULAR LENS PLACEMENT (IOC) LEFT 8.26  00:46.6;  Surgeon: Eulogio Bear, MD;  Location: [facility];  Service: Ophthalmology;  Laterality: Left;  . DOBUTAMINE STRESS ECHO  11/11   negative  . KNEE ARTHROSCOPY  1998   right  . MELANOMA EXCISION  11/09/10   wide excision right upper arm  . SQUAMOUS CELL CARCINOMA EXCISION  02/2016  . TONSILLECTOMY AND ADENOIDECTOMY  1960    Current Outpatient Medications  Medication Sig  Dispense Refill  . amLODipine (NORVASC) 5 MG tablet Take 1 tablet (5 mg total) by mouth daily. 90 tablet 1  . atorvastatin (LIPITOR) 20 MG tablet TAKE 1 TABLET(20 MG) BY MOUTH DAILY 90 tablet 1  . loratadine (CLARITIN) 10 MG tablet Take 10 mg by mouth daily.    . potassium chloride SA (K-DUR) 20 MEQ tablet Take 1 tablet  (20 mEq total) by mouth daily. 90 tablet 1  . torsemide (DEMADEX) 20 MG tablet Take 1 tablet (20 mg total) by mouth every other day. 90 tablet 1   No current facility-administered medications for this visit.    Allergies:   Triamterene   Social History:  The patient  reports that she has been smoking cigarettes. She has never used smokeless tobacco. She reports current alcohol use of about 7.0 standard drinks of alcohol per week. She reports that she does not use drugs.   Family History:   family history includes Alzheimer's disease in her mother; Pancreatic cancer in her father; Parkinson's disease in her brother.    Review of Systems: Review of Systems  Constitutional: Negative.   HENT: Negative.   Respiratory: Positive for shortness of breath.   Cardiovascular: Negative.   Gastrointestinal: Negative.   Musculoskeletal: Negative.   Neurological: Negative.   Psychiatric/Behavioral: Negative.   All other systems reviewed and are negative.   PHYSICAL EXAM: VS:  BP (!) 160/70 (BP Location: Right Arm, Patient Position: Sitting, Cuff Size: Normal)   Pulse 77   Ht 5\' 4"  (1.626 m)   Wt 153 lb 2 oz (69.5 kg)   LMP  (LMP Unknown)   SpO2 96%   BMI 26.28 kg/m  , BMI Body mass index is 26.28 kg/m. GEN: Well nourished, well developed, in no acute distress HEENT: normal Neck: no JVD, carotid bruits, or masses Cardiac: RRR; no murmurs, rubs, or gallops,no edema  Respiratory:  clear to auscultation bilaterally, normal work of breathing GI: soft, nontender, nondistended, + BS MS: no deformity or atrophy Skin: warm and dry, no rash Neuro:  Strength and sensation are intact Psych: euthymic mood, full affect   Recent Labs: 05/30/2019: BUN 24; Creatinine, Ser 1.38; Hemoglobin 10.6; Platelets 170.0; Potassium 3.5; Sodium 138    Lipid Panel Lab Results  Component Value Date   CHOL 120 12/26/2017   HDL 34.70 (L) 12/26/2017   LDLCALC 52 12/26/2017   TRIG 165.0 (H) 12/26/2017       Wt Readings from Last 3 Encounters:  08/13/19 153 lb 2 oz (69.5 kg)  07/27/19 154 lb (69.9 kg)  07/16/19 159 lb (72.1 kg)       ASSESSMENT AND PLAN:  Problem List Items Addressed This Visit      Cardiology Problems   Chronic diastolic heart failure (HCC) (Chronic)   Essential hypertension, benign   Peripheral arterial disease (HCC) - Primary   AAA (abdominal aortic aneurysm) (HCC)   Aortic atherosclerosis ([place])    Other Visit Diagnoses    Tobacco use       Mixed hyperlipidemia         Shortness of breath Likely multifactorial including deconditioning/sedentary lifestyle, diastolic CHF, COPD from long history of smoking, anemia -Discussed doing a stress test to rule out ischemia, she prefers to wait -She did have echocardiogram several years ago which was essentially normal, we could repeat it but she prefers to wait -She would like to work on her conditioning with increased walking and if symptoms do not improve she will call our office  Chronic diastolic CHF Taking torsemide 20 mg every other day On days when she has lower extremity edema recommended that she take extra torsemide on the off days half or whole pill If she starts to regular extra doses of torsemide we will need to monitor her kidney function  Hypertension On amlodipine 5 daily, prior blood pressures look well controlled, no changes made  COPD Long smoking history, chronic cough, Likely chronic bronchitis She is not on inhalers   Disposition:   F/U  12 months   Total encounter time more than 60 minutes  Greater than 50% was spent in counseling and coordination of care with the patient    Signed, Esmond Plants, M.D., Ph.D. [place], [place]

## 2019-08-13 ENCOUNTER — Encounter: Payer: Self-pay | Admitting: Cardiovascular Disease

## 2019-08-13 ENCOUNTER — Other Ambulatory Visit: Payer: Self-pay

## 2019-08-13 ENCOUNTER — Ambulatory Visit (INDEPENDENT_AMBULATORY_CARE_PROVIDER_SITE_OTHER): Payer: Medicare Other | Admitting: Cardiovascular Disease

## 2019-08-13 VITALS — BP 160/70 | HR 77 | Ht 64.0 in | Wt 153.1 lb

## 2019-08-13 DIAGNOSIS — I5032 Chronic diastolic (congestive) heart failure: Secondary | ICD-10-CM | POA: Diagnosis not present

## 2019-08-13 DIAGNOSIS — I739 Peripheral vascular disease, unspecified: Secondary | ICD-10-CM

## 2019-08-13 DIAGNOSIS — I10 Essential (primary) hypertension: Secondary | ICD-10-CM

## 2019-08-13 DIAGNOSIS — I700 Atherosclerosis of aorta: Secondary | ICD-10-CM | POA: Diagnosis not present

## 2019-08-13 DIAGNOSIS — I714 Abdominal aortic aneurysm, without rupture, unspecified: Secondary | ICD-10-CM

## 2019-08-13 DIAGNOSIS — E782 Mixed hyperlipidemia: Secondary | ICD-10-CM

## 2019-08-13 DIAGNOSIS — Z720 Tobacco use: Secondary | ICD-10-CM | POA: Diagnosis not present

## 2019-08-13 MED ORDER — TORSEMIDE 20 MG PO TABS: 20.0000 mg | ORAL_TABLET | ORAL | 3 refills | Status: DC

## 2019-08-13 NOTE — Patient Instructions (Addendum)
Try extra torsemide  Consider taking daily   Medication Instructions:  No changes  If you need a refill on your cardiac medications before your next appointment, please call your pharmacy.    Lab work: No new labs needed   If you have labs (blood work) drawn today and your tests are completely normal, you will receive your results only by: Marland Kitchen MyChart Message (if you have MyChart) OR . A paper copy in the mail If you have any lab test that is abnormal or we need to change your treatment, we will call you to review the results.   Testing/Procedures: No new testing needed   Follow-Up: At [facility], you and your health needs are our priority.  As part of our continuing mission to provide you with exceptional heart care, we have created designated Provider Care Teams.  These Care Teams include your primary Cardiologist (physician) and Advanced Practice Providers (APPs -  Physician Assistants and Nurse Practitioners) who all work together to provide you with the care you need, when you need it.  . You will need a follow up appointment in 6 months  . Providers on your designated Care Team:   . Murray Hodgkins, NP . Christell Faith, PA-C . Marrianne Mood, PA-C  Any Other Special Instructions Will Be Listed Below (If Applicable).  COVID-19 Vaccine Information can be found at: [email or web address] For questions related to vaccine distribution or appointments, please email [email or web address] or call [phone].

## 2019-08-20 ENCOUNTER — Other Ambulatory Visit: Payer: Self-pay | Admitting: Internal Medicine

## 2019-08-30 ENCOUNTER — Other Ambulatory Visit: Payer: Self-pay | Admitting: Internal Medicine

## 2019-09-19 ENCOUNTER — Encounter: Payer: Self-pay | Admitting: Internal Medicine

## 2019-09-27 DIAGNOSIS — D485 Neoplasm of uncertain behavior of skin: Secondary | ICD-10-CM | POA: Diagnosis not present

## 2019-09-27 DIAGNOSIS — Z08 Encounter for follow-up examination after completed treatment for malignant neoplasm: Secondary | ICD-10-CM | POA: Diagnosis not present

## 2019-09-27 DIAGNOSIS — Z85828 Personal history of other malignant neoplasm of skin: Secondary | ICD-10-CM | POA: Diagnosis not present

## 2019-09-27 DIAGNOSIS — L720 Epidermal cyst: Secondary | ICD-10-CM | POA: Diagnosis not present

## 2019-09-27 DIAGNOSIS — X32XXXA Exposure to sunlight, initial encounter: Secondary | ICD-10-CM | POA: Diagnosis not present

## 2019-09-27 DIAGNOSIS — Z85820 Personal history of malignant melanoma of skin: Secondary | ICD-10-CM | POA: Diagnosis not present

## 2019-09-27 DIAGNOSIS — L570 Actinic keratosis: Secondary | ICD-10-CM | POA: Diagnosis not present

## 2019-10-09 ENCOUNTER — Emergency Department: Payer: Medicare Other

## 2019-10-09 ENCOUNTER — Inpatient Hospital Stay
Admission: EM | Admit: 2019-10-09 | Discharge: 2019-10-12 | DRG: 522 | Disposition: A | Discharge status: Skilled Nursing Facility | Payer: Medicare Other | Attending: Internal Medicine | Admitting: Internal Medicine

## 2019-10-09 ENCOUNTER — Other Ambulatory Visit: Payer: Self-pay

## 2019-10-09 DIAGNOSIS — N1832 Chronic kidney disease, stage 3b: Secondary | ICD-10-CM | POA: Diagnosis not present

## 2019-10-09 DIAGNOSIS — E785 Hyperlipidemia, unspecified: Secondary | ICD-10-CM | POA: Diagnosis not present

## 2019-10-09 DIAGNOSIS — Z79899 Other long term (current) drug therapy: Secondary | ICD-10-CM | POA: Diagnosis not present

## 2019-10-09 DIAGNOSIS — I708 Atherosclerosis of other arteries: Secondary | ICD-10-CM | POA: Diagnosis present

## 2019-10-09 DIAGNOSIS — Z820 Family history of epilepsy and other diseases of the nervous system: Secondary | ICD-10-CM | POA: Diagnosis not present

## 2019-10-09 DIAGNOSIS — W010XXA Fall on same level from slipping, tripping and stumbling without subsequent striking against object, initial encounter: Secondary | ICD-10-CM | POA: Diagnosis present

## 2019-10-09 DIAGNOSIS — S7291XD Unspecified fracture of right femur, subsequent encounter for closed fracture with routine healing: Secondary | ICD-10-CM | POA: Diagnosis not present

## 2019-10-09 DIAGNOSIS — I5032 Chronic diastolic (congestive) heart failure: Secondary | ICD-10-CM | POA: Diagnosis not present

## 2019-10-09 DIAGNOSIS — W19XXXA Unspecified fall, initial encounter: Secondary | ICD-10-CM | POA: Diagnosis not present

## 2019-10-09 DIAGNOSIS — J449 Chronic obstructive pulmonary disease, unspecified: Secondary | ICD-10-CM | POA: Diagnosis not present

## 2019-10-09 DIAGNOSIS — S299XXA Unspecified injury of thorax, initial encounter: Secondary | ICD-10-CM | POA: Diagnosis not present

## 2019-10-09 DIAGNOSIS — I4581 Long QT syndrome: Secondary | ICD-10-CM | POA: Diagnosis present

## 2019-10-09 DIAGNOSIS — N183 Chronic kidney disease, stage 3 unspecified: Secondary | ICD-10-CM | POA: Diagnosis not present

## 2019-10-09 DIAGNOSIS — R269 Unspecified abnormalities of gait and mobility: Secondary | ICD-10-CM | POA: Diagnosis not present

## 2019-10-09 DIAGNOSIS — Y92038 Other place in apartment as the place of occurrence of the external cause: Secondary | ICD-10-CM | POA: Diagnosis not present

## 2019-10-09 DIAGNOSIS — F17210 Nicotine dependence, cigarettes, uncomplicated: Secondary | ICD-10-CM | POA: Diagnosis present

## 2019-10-09 DIAGNOSIS — I130 Hypertensive heart and chronic kidney disease with heart failure and stage 1 through stage 4 chronic kidney disease, or unspecified chronic kidney disease: Secondary | ICD-10-CM | POA: Diagnosis present

## 2019-10-09 DIAGNOSIS — Z888 Allergy status to other drugs, medicaments and biological substances status: Secondary | ICD-10-CM

## 2019-10-09 DIAGNOSIS — Z66 Do not resuscitate: Secondary | ICD-10-CM | POA: Diagnosis present

## 2019-10-09 DIAGNOSIS — Z85820 Personal history of malignant melanoma of skin: Secondary | ICD-10-CM

## 2019-10-09 DIAGNOSIS — M25572 Pain in left ankle and joints of left foot: Secondary | ICD-10-CM | POA: Diagnosis not present

## 2019-10-09 DIAGNOSIS — E1122 Type 2 diabetes mellitus with diabetic chronic kidney disease: Secondary | ICD-10-CM | POA: Diagnosis not present

## 2019-10-09 DIAGNOSIS — Y9301 Activity, walking, marching and hiking: Secondary | ICD-10-CM | POA: Diagnosis present

## 2019-10-09 DIAGNOSIS — Z20822 Contact with and (suspected) exposure to covid-19: Secondary | ICD-10-CM | POA: Diagnosis present

## 2019-10-09 DIAGNOSIS — S72001A Fracture of unspecified part of neck of right femur, initial encounter for closed fracture: Secondary | ICD-10-CM | POA: Diagnosis not present

## 2019-10-09 DIAGNOSIS — D631 Anemia in chronic kidney disease: Secondary | ICD-10-CM | POA: Diagnosis not present

## 2019-10-09 DIAGNOSIS — X58XXXD Exposure to other specified factors, subsequent encounter: Secondary | ICD-10-CM | POA: Diagnosis not present

## 2019-10-09 DIAGNOSIS — R110 Nausea: Secondary | ICD-10-CM | POA: Diagnosis not present

## 2019-10-09 DIAGNOSIS — Z800 Family history of malignant neoplasm of digestive organs: Secondary | ICD-10-CM | POA: Diagnosis not present

## 2019-10-09 DIAGNOSIS — R2681 Unsteadiness on feet: Secondary | ICD-10-CM | POA: Diagnosis present

## 2019-10-09 DIAGNOSIS — E876 Hypokalemia: Secondary | ICD-10-CM | POA: Diagnosis present

## 2019-10-09 DIAGNOSIS — D649 Anemia, unspecified: Secondary | ICD-10-CM | POA: Diagnosis not present

## 2019-10-09 DIAGNOSIS — Z471 Aftercare following joint replacement surgery: Secondary | ICD-10-CM | POA: Diagnosis not present

## 2019-10-09 DIAGNOSIS — I739 Peripheral vascular disease, unspecified: Secondary | ICD-10-CM | POA: Diagnosis not present

## 2019-10-09 DIAGNOSIS — Z03818 Encounter for observation for suspected exposure to other biological agents ruled out: Secondary | ICD-10-CM | POA: Diagnosis not present

## 2019-10-09 DIAGNOSIS — R5381 Other malaise: Secondary | ICD-10-CM | POA: Diagnosis not present

## 2019-10-09 DIAGNOSIS — I872 Venous insufficiency (chronic) (peripheral): Secondary | ICD-10-CM | POA: Diagnosis present

## 2019-10-09 DIAGNOSIS — Z8679 Personal history of other diseases of the circulatory system: Secondary | ICD-10-CM | POA: Diagnosis not present

## 2019-10-09 DIAGNOSIS — R279 Unspecified lack of coordination: Secondary | ICD-10-CM | POA: Diagnosis not present

## 2019-10-09 DIAGNOSIS — M6281 Muscle weakness (generalized): Secondary | ICD-10-CM | POA: Diagnosis not present

## 2019-10-09 DIAGNOSIS — C9110 Chronic lymphocytic leukemia of B-cell type not having achieved remission: Secondary | ICD-10-CM | POA: Diagnosis present

## 2019-10-09 DIAGNOSIS — R278 Other lack of coordination: Secondary | ICD-10-CM | POA: Diagnosis not present

## 2019-10-09 DIAGNOSIS — S72031A Displaced midcervical fracture of right femur, initial encounter for closed fracture: Secondary | ICD-10-CM | POA: Insufficient documentation

## 2019-10-09 DIAGNOSIS — Z96649 Presence of unspecified artificial hip joint: Secondary | ICD-10-CM

## 2019-10-09 DIAGNOSIS — Z96641 Presence of right artificial hip joint: Secondary | ICD-10-CM | POA: Diagnosis not present

## 2019-10-09 DIAGNOSIS — I10 Essential (primary) hypertension: Secondary | ICD-10-CM | POA: Diagnosis present

## 2019-10-09 DIAGNOSIS — R2689 Other abnormalities of gait and mobility: Secondary | ICD-10-CM | POA: Diagnosis not present

## 2019-10-09 DIAGNOSIS — Z01818 Encounter for other preprocedural examination: Secondary | ICD-10-CM

## 2019-10-09 DIAGNOSIS — S72041A Displaced fracture of base of neck of right femur, initial encounter for closed fracture: Secondary | ICD-10-CM | POA: Diagnosis not present

## 2019-10-09 DIAGNOSIS — S72001D Fracture of unspecified part of neck of right femur, subsequent encounter for closed fracture with routine healing: Secondary | ICD-10-CM | POA: Diagnosis not present

## 2019-10-09 DIAGNOSIS — M25551 Pain in right hip: Secondary | ICD-10-CM | POA: Diagnosis not present

## 2019-10-09 DIAGNOSIS — R52 Pain, unspecified: Secondary | ICD-10-CM | POA: Diagnosis not present

## 2019-10-09 DIAGNOSIS — R9431 Abnormal electrocardiogram [ECG] [EKG]: Secondary | ICD-10-CM | POA: Diagnosis not present

## 2019-10-09 LAB — URINALYSIS, COMPLETE (UACMP) WITH MICROSCOPIC
Bacteria, UA: NONE SEEN
Bilirubin Urine: NEGATIVE
Glucose, UA: NEGATIVE mg/dL
Hgb urine dipstick: NEGATIVE
Ketones, ur: NEGATIVE mg/dL
Leukocytes,Ua: NEGATIVE
Nitrite: NEGATIVE
Protein, ur: 100 mg/dL — AB
Specific Gravity, Urine: 1.009 (ref 1.005–1.030)
Squamous Epithelial / HPF: NONE SEEN (ref 0–5)
pH: 7 (ref 5.0–8.0)

## 2019-10-09 LAB — BASIC METABOLIC PANEL
Anion gap: 13 (ref 5–15)
BUN: 31 mg/dL — ABNORMAL HIGH (ref 8–23)
CO2: 20 mmol/L — ABNORMAL LOW (ref 22–32)
Calcium: 9.2 mg/dL (ref 8.9–10.3)
Chloride: 104 mmol/L (ref 98–111)
Creatinine, Ser: 1.42 mg/dL — ABNORMAL HIGH (ref 0.44–1.00)
GFR calc Af Amer: 39 mL/min — ABNORMAL LOW (ref >60)
GFR calc non Af Amer: 34 mL/min — ABNORMAL LOW (ref >60)
Glucose, Bld: 114 mg/dL — ABNORMAL HIGH (ref 70–99)
Potassium: 3.3 mmol/L — ABNORMAL LOW (ref 3.5–5.1)
Sodium: 137 mmol/L (ref 135–145)

## 2019-10-09 LAB — CBC WITH DIFFERENTIAL/PLATELET
Abs Immature Granulocytes: 0 10*3/uL (ref 0.00–0.07)
Basophils Absolute: 0 10*3/uL (ref 0.0–0.1)
Basophils Relative: 0 %
Eosinophils Absolute: 0 10*3/uL (ref 0.0–0.5)
Eosinophils Relative: 0 %
HCT: 31.9 % — ABNORMAL LOW (ref 36.0–46.0)
Hemoglobin: 11.2 g/dL — ABNORMAL LOW (ref 12.0–15.0)
Lymphocytes Relative: 14 %
Lymphs Abs: 3.7 10*3/uL (ref 0.7–4.0)
MCH: 34.4 pg — ABNORMAL HIGH (ref 26.0–34.0)
MCHC: 35.1 g/dL (ref 30.0–36.0)
MCV: 97.9 fL (ref 80.0–100.0)
Monocytes Absolute: 0.5 10*3/uL (ref 0.1–1.0)
Monocytes Relative: 2 %
Neutro Abs: 22.1 10*3/uL — ABNORMAL HIGH (ref 1.7–7.7)
Neutrophils Relative %: 84 %
Platelets: 143 10*3/uL — ABNORMAL LOW (ref 150–400)
RBC: 3.26 MIL/uL — ABNORMAL LOW (ref 3.87–5.11)
RDW: 15.9 % — ABNORMAL HIGH (ref 11.5–15.5)
Smear Review: NORMAL
WBC: 26.3 10*3/uL — ABNORMAL HIGH (ref 4.0–10.5)
nRBC: 0 % (ref 0.0–0.2)

## 2019-10-09 LAB — SARS CORONAVIRUS 2 BY RT PCR (HOSPITAL ORDER, PERFORMED IN ~~LOC~~ HOSPITAL LAB): SARS Coronavirus 2: NEGATIVE

## 2019-10-09 MED ORDER — MORPHINE SULFATE (PF) 2 MG/ML IV SOLN: 0.5000 mg | INTRAVENOUS | Status: DC | PRN

## 2019-10-09 MED ORDER — ONDANSETRON HCL 4 MG/2ML IJ SOLN
INTRAMUSCULAR | Status: AC
  Filled 2019-10-09: qty 2

## 2019-10-09 MED ORDER — FENTANYL CITRATE (PF) 100 MCG/2ML IJ SOLN
INTRAMUSCULAR | Status: AC
  Filled 2019-10-09: qty 2

## 2019-10-09 MED ORDER — ONDANSETRON HCL 4 MG/2ML IJ SOLN
4.0000 mg | Freq: Once | INTRAMUSCULAR | Status: AC
  Administered 2019-10-09: 4 mg via INTRAVENOUS

## 2019-10-09 MED ORDER — AMLODIPINE BESYLATE 5 MG PO TABS
5.0000 mg | ORAL_TABLET | Freq: Every day | ORAL | Status: DC
  Administered 2019-10-11 – 2019-10-12 (×2): 5 mg via ORAL
  Filled 2019-10-09 (×2): qty 1

## 2019-10-09 MED ORDER — ONDANSETRON HCL 4 MG/2ML IJ SOLN
4.0000 mg | Freq: Four times a day (QID) | INTRAMUSCULAR | Status: DC | PRN
  Administered 2019-10-09 – 2019-10-10 (×2): 4 mg via INTRAVENOUS
  Filled 2019-10-09 (×2): qty 2

## 2019-10-09 MED ORDER — BISACODYL 5 MG PO TBEC: 5.0000 mg | DELAYED_RELEASE_TABLET | Freq: Every day | ORAL | Status: DC | PRN

## 2019-10-09 MED ORDER — ATORVASTATIN CALCIUM 20 MG PO TABS: 10.0000 mg | ORAL_TABLET | Freq: Every day | ORAL | Status: DC

## 2019-10-09 MED ORDER — FENTANYL CITRATE (PF) 100 MCG/2ML IJ SOLN
50.0000 ug | Freq: Once | INTRAMUSCULAR | Status: AC
  Administered 2019-10-09: 50 ug via INTRAVENOUS

## 2019-10-09 MED ORDER — HYDROCODONE-ACETAMINOPHEN 5-325 MG PO TABS
1.0000 | ORAL_TABLET | Freq: Four times a day (QID) | ORAL | Status: DC | PRN
  Administered 2019-10-09: 1 via ORAL
  Administered 2019-10-10: 2 via ORAL
  Filled 2019-10-09: qty 1
  Filled 2019-10-09: qty 2

## 2019-10-09 MED ORDER — POTASSIUM CHLORIDE CRYS ER 20 MEQ PO TBCR: 40.0000 meq | EXTENDED_RELEASE_TABLET | Freq: Once | ORAL | Status: DC

## 2019-10-09 MED ORDER — HYDRALAZINE HCL 20 MG/ML IJ SOLN
10.0000 mg | INTRAMUSCULAR | Status: DC | PRN
  Administered 2019-10-09 – 2019-10-11 (×3): 10 mg via INTRAVENOUS
  Filled 2019-10-09 (×3): qty 0.5
  Filled 2019-10-09: qty 1

## 2019-10-09 MED ORDER — MORPHINE SULFATE (PF) 2 MG/ML IV SOLN
2.0000 mg | Freq: Once | INTRAVENOUS | Status: AC
  Administered 2019-10-09: 2 mg via INTRAVENOUS
  Filled 2019-10-09: qty 1

## 2019-10-09 MED ORDER — MORPHINE SULFATE (PF) 2 MG/ML IV SOLN
2.0000 mg | Freq: Once | INTRAVENOUS | Status: AC
  Administered 2019-10-09: 2 mg via INTRAVENOUS

## 2019-10-09 MED ORDER — LORATADINE 10 MG PO TABS
10.0000 mg | ORAL_TABLET | Freq: Every day | ORAL | Status: DC
  Administered 2019-10-11 – 2019-10-12 (×2): 10 mg via ORAL
  Filled 2019-10-09 (×2): qty 1

## 2019-10-09 MED ORDER — MORPHINE SULFATE (PF) 2 MG/ML IV SOLN
INTRAVENOUS | Status: AC
  Filled 2019-10-09: qty 1

## 2019-10-09 MED ORDER — MAGNESIUM CITRATE PO SOLN
1.0000 | Freq: Once | ORAL | Status: DC | PRN
  Filled 2019-10-09: qty 296

## 2019-10-09 MED ORDER — LORATADINE 10 MG PO TABS: 10.0000 mg | ORAL_TABLET | Freq: Every day | ORAL | Status: DC

## 2019-10-09 MED ORDER — CEFAZOLIN SODIUM-DEXTROSE 2-4 GM/100ML-% IV SOLN
2.0000 g | Freq: Once | INTRAVENOUS | Status: AC
  Administered 2019-10-10: 2 g via INTRAVENOUS
  Filled 2019-10-09 (×2): qty 100

## 2019-10-09 MED ORDER — AMLODIPINE BESYLATE 5 MG PO TABS: 5.0000 mg | ORAL_TABLET | Freq: Every day | ORAL | Status: DC

## 2019-10-09 MED ORDER — SENNOSIDES-DOCUSATE SODIUM 8.6-50 MG PO TABS: 1.0000 | ORAL_TABLET | Freq: Every evening | ORAL | Status: DC | PRN

## 2019-10-09 MED ORDER — ATORVASTATIN CALCIUM 10 MG PO TABS
10.0000 mg | ORAL_TABLET | Freq: Every day | ORAL | Status: DC
  Administered 2019-10-11 – 2019-10-12 (×2): 10 mg via ORAL
  Filled 2019-10-09 (×2): qty 1

## 2019-10-09 NOTE — Consult Note (Signed)
ORTHOPAEDIC CONSULTATION  REQUESTING PHYSICIAN: Lilia Pro., MD  Chief Complaint:   Right hip pain.  History of Present Illness: Tiffany Martinez is a 86 y.o. female with multiple medical problems including peripheral artery disease, hypertension, hyperlipidemia, chronic lymphocytic leukemia, congestive heart failure, and anxiety, who lives in a local retirement community.  Apparently, the patient has noted a gradual progressive difficulty with her balance and has spoken about this with her primary care provider.  She apparently lost her balance today and fell, landing on her right hip.  She was unable to get up.  She was brought to the emergency room where x-rays demonstrated a displaced right femoral neck fracture.  The patient denies any associated injuries.  She did not strike her head or lose consciousness.  She also denies any lightheadedness, dizziness, chest pain, shortness of breath, or other symptoms which may have precipitated her fall.  Past Medical History:  Diagnosis Date  . AAA (abdominal aortic aneurysm) ([place]) 2012   3.9cm, rec rpt 1 yr  . Anemia   . Anxiety   . CHF (congestive heart failure) ([place])   . Chronic venous insufficiency   . CLL (chronic lymphocytic leukemia) ([place]) 2014   Stage 0--by flow cytometry  . Hx of colonoscopy   . Hyperlipidemia   . Hypertension   . Melanoma ([place])    Right Arm  . Migraine headache    3-4X/yr  . PAD (peripheral artery disease) ([place]) 2012   severe R common iliac stenosis, ABI 0.73, mid L common iliac stenosis ABI 1.1  . PAD (peripheral artery disease) ([place])   . Wears dentures    full upper and lower   Past Surgical History:  Procedure Laterality Date  . ABDOMINAL AORTIC ANEURYSM REPAIR  02/16/12   [facility]--Dr Sammuel Hines  . CARDIOVASCULAR STRESS TEST  2008   negative  . CATARACT EXTRACTION W/PHACO Left 07/16/2019   Procedure: CATARACT EXTRACTION PHACO AND INTRAOCULAR LENS  PLACEMENT (IOC) LEFT 8.26  00:46.6;  Surgeon: Eulogio Bear, MD;  Location: [facility];  Service: Ophthalmology;  Laterality: Left;  . DOBUTAMINE STRESS ECHO  11/11   negative  . KNEE ARTHROSCOPY  1998   right  . MELANOMA EXCISION  11/09/10   wide excision right upper arm  . SQUAMOUS CELL CARCINOMA EXCISION  02/2016  . TONSILLECTOMY AND ADENOIDECTOMY  1960   Social History   Socioeconomic History  . Marital status: Widowed    Spouse name: Not on file  . Number of children: 4  . Years of education: Not on file  . Highest education level: Not on file  Occupational History  . Occupation: retiredCommunity education officer,   Tobacco Use  . Smoking status: Light Tobacco Smoker    Types: Cigarettes    Last attempt to quit: 08/15/2017    Years since quitting: 2.1  . Smokeless tobacco: Never Used  . Tobacco comment: occassionally  Substance and Sexual Activity  . Alcohol use: Yes    Alcohol/week: 7.0 standard drinks    Types: 7 Glasses of wine per week    Comment: regular wine with dinner  . Drug use: No  . Sexual activity: Never  Other Topics Concern  . Not on file  Social History Narrative   Widowed 2/11 -2nd for him , 1st for her. 4 step sons. 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 (alternate would be someone else with his company)   Requests DNR   Discussed MOST form---but would probably accept IV fluids/antibiotics, etc   Requests no feeding tube         Social Determinants of Health   Financial Resource Strain:   . Difficulty of Paying Living Expenses:   Food Insecurity:   . Worried About Charity fundraiser in the Last Year:   . Arboriculturist in the Last Year:   Transportation Needs:   . Film/video editor (Medical):   Marland Kitchen Lack of Transportation (Non-Medical):   Physical Activity:   . Days of Exercise per Week:   . Minutes of Exercise per Session:    Stress:   . Feeling of Stress :   Social Connections:   . Frequency of Communication with Friends and Family:   . Frequency of Social Gatherings with Friends and Family:   . Attends Religious Services:   . Active Member of Clubs or Organizations:   . Attends Archivist Meetings:   Marland Kitchen Marital Status:    Family History  Problem Relation Age of Onset  . Alzheimer's disease Mother   . Pancreatic cancer Father   . Parkinson's disease Brother   . Coronary artery disease Neg Hx   . Diabetes Neg Hx   . Cancer Neg Hx        breast or colon   Allergies  Allergen Reactions  . Triamterene Hives   Prior to Admission medications   Medication Sig Start Date End Date Taking? Authorizing Provider  amLODipine (NORVASC) 5 MG tablet TAKE 1 TABLET(5 MG) BY MOUTH DAILY Patient taking differently: Take 5 mg by mouth daily.  08/20/19  Yes Viviana Simpler I, MD  atorvastatin (LIPITOR) 20 MG tablet TAKE 1 TABLET(20 MG) BY MOUTH DAILY 08/30/19  Yes Venia Carbon, MD  loratadine (CLARITIN) 10 MG tablet Take 10 mg by mouth daily.   Yes [provider]  potassium chloride SA (K-DUR) 20 MEQ tablet Take 1 tablet (20 mEq total) by mouth daily. 11/14/18  Yes Venia Carbon, MD  torsemide (DEMADEX) 20 MG tablet Take 1 tablet (20 mg total) by mouth as directed. Take 1 tablet (20 mg) every other day with extra as needed for swelling. 08/13/19 11/11/19 Yes Gollan, Kathlene November, MD   DG Chest 1 View  Result Date: 10/09/2019 CLINICAL DATA:  Status post fall. EXAM: CHEST  1 VIEW COMPARISON:  August 24, 2017 FINDINGS: Mild diffuse chronic appearing increased lung markings are seen without evidence of acute infiltrate, pleural effusion or pneumothorax. The heart size and mediastinal contours are within normal limits. There is marked severity calcification of the thoracic aorta. In ill-defined lateral seventh left rib deformity is seen. IMPRESSION: No active disease. Electronically Signed   By: Virgina Norfolk M.D.   On: 10/09/2019 15:51   DG Hip Unilat  With Pelvis 2-3 Views Right  Result Date: 10/09/2019 CLINICAL DATA:  Fall EXAM: DG HIP (WITH OR WITHOUT PELVIS) 2-3V RIGHT COMPARISON:  None. FINDINGS: There is a fracture through the neck of the right femur with displacement and angulation. Probable calcified fibroid. Aortoiliac stents. IMPRESSION: Acute right femoral neck fracture with displacement and angulation. Electronically Signed   By: Macy Mis M.D.   On: 10/09/2019 15:47    Positive ROS: All other systems have been reviewed and were otherwise negative with the exception of those mentioned in the HPI and as above.  Physical Exam: General:  Alert,  no acute distress Psychiatric:  Patient is competent for consent with normal mood and affect   Cardiovascular:  No pedal edema Respiratory:  No wheezing, non-labored breathing GI:  Abdomen is soft and non-tender Skin:  No lesions in the area of chief complaint Neurologic:  Sensation intact distally Lymphatic:  No axillary or cervical lymphadenopathy  Orthopedic Exam:  Orthopedic examination is limited to the right hip and lower extremity.  The right lower extremity is somewhat shortened and externally rotated as compared to the left.  Skin inspection around the right hip is unremarkable.  No swelling, erythema, ecchymosis, abrasions, or other skin abnormalities are identified.  She has mild tenderness to palpation of the anterolateral aspect of the hip.  She has more severe pain with any attempted active or passive motion of the hip.  She is able dorsiflex and plantarflex her toes and ankle.  Sensation is intact to light touch to all distributions of her right lower extremity and foot.  She has good capillary refill to her right foot.  X-rays:  X-rays of the pelvis and right hip are available for review and have been reviewed by myself.  These films demonstrate a varus angulated right femoral neck fracture.  No significant degenerative  changes are identified.  No lytic lesions or other acute bony abnormalities are identified.  Assessment: Displaced right femoral neck fracture.  Plan: The treatment options have been discussed with the patient, including both surgical and nonsurgical choices.  The patient like to proceed with surgical intervention to include a right hip hemiarthroplasty.  This procedure has been discussed in detail with the patient, as have the potential risks (including bleeding, infection, nerve and/or blood vessel injury, persistent or recurrent pain, loosening of and/or failure of the components, dislocation, leg length inequality, need for further surgery, blood clots, strokes, heart attacks and/or arrhythmias, etc.) and benefits.  The patient states her understanding and wishes to proceed.  A formal written consent will be obtained by the nursing staff.  We will plan on doing this case tomorrow around 1 PM, assuming she is cleared medically.  Thank you for asking me to participate in the care of this most delightful woman.  I will be happy to follow her with you.   Pascal Lux, MD  Beeper #:  [phone]  10/09/2019 5:27 PM

## 2019-10-09 NOTE — ED Notes (Signed)
Pt lying on left side after xray. Refused to move onto back. Pain meds given. Pt still refusing to lay on back for EKG. Unable to perform EKG at this time d/t py lying on L side. Will attempt to perform once pt lays on back.

## 2019-10-09 NOTE — ED Triage Notes (Addendum)
Pt arrives [facility] from [facility] for a fall on carpet. Injured R hip. EMS reports shortening and rotation. No shortening noted by this RN. Pt A&O, refused IV and pain meds to EMS. Denies dizziness. States got tripped up.

## 2019-10-09 NOTE — ED Provider Notes (Signed)
[facility] Emergency Department Provider Note  ____________________________________________   First MD Initiated Contact with Patient 10/09/19 1436     (approximate)  I have reviewed the triage vital signs and the nursing notes.  History  Chief Complaint Hip Pain    HPI Tiffany Martinez is a 86 y.o. female PMHx as below, who presents to the ER for a fall w/ resultant R hip pain and concern for fracture.  Patient states she tripped and fell, landing onto her right hip.  Occurred just prior to arrival.  Has been unable to ambulate since then.  Denies any other related injuries or pain elsewhere.  No head trauma or loss of consciousness.  She reports acute pain of the right hip since the fall, 3/10 in severity, sharp, no radiation.  Worsened with movement, improved with laying still.  Not on any blood thinning medications.  Denies any preceding lightheadedness, dizziness, chest pain, palpitations prior to her fall.  However, she does state for the last months to years she has had some intermittent unsteadiness/off-balance sensation when walking.  She says this was not a component of her fall today.   Past Medical Hx Past Medical History:  Diagnosis Date  . AAA (abdominal aortic aneurysm) ([place]) 2012   3.9cm, rec rpt 1 yr  . Anemia   . Anxiety   . CHF (congestive heart failure) ([place])   . Chronic venous insufficiency   . CLL (chronic lymphocytic leukemia) ([place]) 2014   Stage 0--by flow cytometry  . Hx of colonoscopy   . Hyperlipidemia   . Hypertension   . Melanoma ([place])    Right Arm  . Migraine headache    3-4X/yr  . PAD (peripheral artery disease) ([place]) 2012   severe R common iliac stenosis, ABI 0.73, mid L common iliac stenosis ABI 1.1  . PAD (peripheral artery disease) ([place])   . Wears dentures    full upper and lower    Problem List Patient Active Problem List   Diagnosis Date Noted  . Weakness 05/30/2019  . Secondary hyperparathyroidism of renal  origin ([place]) 06/30/2018  . Insomnia 02/02/2018  . COPD (chronic obstructive pulmonary disease) ([place]) 12/26/2017  . Chronic renal disease, stage III 12/26/2017  . Aortic atherosclerosis ([place]) 09/14/2017  . CLL (chronic lymphocytic leukemia) ([place]) 09/14/2017  . Chronic diastolic heart failure ([place]) 08/25/2017  . Advanced directives, counseling/discussion 01/15/2014  . AAA (abdominal aortic aneurysm) ([place])   . Routine general medical examination at a health care facility 07/07/2012  . History of melanoma 12/24/2011  . Peripheral arterial disease ([place]) 12/21/2010  . History of squamous cell carcinoma of skin 09/28/2010  . Chronic anemia 04/02/2010  . VENOUS INSUFFICIENCY, CHRONIC 04/02/2010  . HYPERLIPIDEMIA 06/23/2009  . Episodic mood disorder ([place]) 06/23/2009  . Essential hypertension, benign 06/23/2009    Past Surgical Hx Past Surgical History:  Procedure Laterality Date  . ABDOMINAL AORTIC ANEURYSM REPAIR  02/16/12   [facility]--Dr Sammuel Hines  . CARDIOVASCULAR STRESS TEST  2008   negative  . CATARACT EXTRACTION W/PHACO Left 07/16/2019   Procedure: CATARACT EXTRACTION PHACO AND INTRAOCULAR LENS PLACEMENT (IOC) LEFT 8.26  00:46.6;  Surgeon: Eulogio Bear, MD;  Location: [facility];  Service: Ophthalmology;  Laterality: Left;  . DOBUTAMINE STRESS ECHO  11/11   negative  . KNEE ARTHROSCOPY  1998   right  . MELANOMA EXCISION  11/09/10   wide excision right upper arm  . SQUAMOUS CELL CARCINOMA EXCISION  02/2016  . TONSILLECTOMY AND ADENOIDECTOMY  1960    Medications Prior to Admission medications   Medication Sig Start Date End Date Taking? Authorizing Provider  amLODipine (NORVASC) 5 MG tablet TAKE 1 TABLET(5 MG) BY MOUTH DAILY Patient taking differently: Take 5 mg by mouth daily.  08/20/19  Yes Viviana Simpler I, MD  atorvastatin (LIPITOR) 20 MG tablet TAKE 1 TABLET(20 MG) BY MOUTH DAILY 08/30/19  Yes Venia Carbon, MD  loratadine (CLARITIN) 10 MG tablet Take 10 mg by mouth  daily.   Yes [provider]  potassium chloride SA (K-DUR) 20 MEQ tablet Take 1 tablet (20 mEq total) by mouth daily. 11/14/18  Yes Venia Carbon, MD  torsemide (DEMADEX) 20 MG tablet Take 1 tablet (20 mg total) by mouth as directed. Take 1 tablet (20 mg) every other day with extra as needed for swelling. 08/13/19 11/11/19 Yes Gollan, Kathlene November, MD    Allergies Triamterene  Family Hx Family History  Problem Relation Age of Onset  . Alzheimer's disease Mother   . Pancreatic cancer Father   . Parkinson's disease Brother   . Coronary artery disease Neg Hx   . Diabetes Neg Hx   . Cancer Neg Hx        breast or colon    Social Hx Social History   Tobacco Use  . Smoking status: Light Tobacco Smoker    Types: Cigarettes    Last attempt to quit: 08/15/2017    Years since quitting: 2.1  . Smokeless tobacco: Never Used  . Tobacco comment: occassionally  Substance Use Topics  . Alcohol use: Yes    Alcohol/week: 7.0 standard drinks    Types: 7 Glasses of wine per week    Comment: regular wine with dinner  . Drug use: No     Review of Systems  Constitutional: Negative for fever. Negative for chills. + fall Eyes: Negative for visual changes. ENT: Negative for sore throat. Cardiovascular: Negative for chest pain. Respiratory: Negative for shortness of breath. Gastrointestinal: Negative for nausea. Negative for vomiting.  Genitourinary: Negative for dysuria. Musculoskeletal: Negative for leg swelling. + R hip pain Skin: Negative for rash. Neurological: Negative for headaches.   Physical Exam  Vital Signs: ED Triage Vitals  Enc Vitals Group     BP 10/09/19 1448 (!) 188/61     Pulse Rate 10/09/19 1440 84     Resp 10/09/19 1440 18     Temp 10/09/19 1440 97.6 F (36.4 C)     Temp Source 10/09/19 1440 Oral     SpO2 10/09/19 1440 97 %     Weight 10/09/19 1447 150 lb (68 kg)     Height 10/09/19 1447 5\' 4"  (1.626 m)     Head Circumference --      Peak Flow --       Pain Score 10/09/19 1447 3     Pain Loc --      Pain Edu? --      Excl. in [place]? --     Constitutional: Alert and oriented.  Elderly appearing.  Head: Normocephalic. Atraumatic. Eyes: Conjunctivae clear. Sclera anicteric. Pupils equal and symmetric. Nose: No masses or lesions. No congestion or rhinorrhea. Mouth/Throat: Wearing mask.  Neck: No stridor. Trachea midline.  No midline CS tenderness. Cardiovascular: Normal rate, regular rhythm. Extremities well perfused.  2+ symmetric DP pulses by palpation.  Toes are warm and well-perfused.  Cap refill less than 3 seconds. Respiratory: Normal respiratory effort.  Lungs CTAB.  Chest wall stable, NT, no palpable step-offs or  crepitance. Gastrointestinal: Soft. Non-distended. Non-tender.  Genitourinary: Deferred. Musculoskeletal: RLE held in external rotation, no significant shortening. FROM bilateral shoulders, elbows, wrists, and LEFT hip/knee/ankle. ROM of RLE deferred 2/2 concern for likely underlying injury.  Compartments soft and compressible. Neurologic:  Normal speech and language. No gross focal or lateralizing neurologic deficits are appreciated.  Skin: Skin is warm, dry and intact. No rash noted. Psychiatric: Mood and affect are appropriate for situation.  EKG  Personally reviewed and interpreted by myself.   Date: 10/09/19 Time: 1650 Rate: 69 Rhythm: sinus Axis: normal Intervals: short PR, prolonged QTc Non specific ST/T changes No STEMI    Radiology  Personally reviewed available imaging myself.   CXR - IMPRESSION:  No active disease.   XR RIGHT hip - IMPRESSION:  Acute right femoral neck fracture with displacement and angulation.    Procedures  Procedure(s) performed (including critical care):  Procedures   Initial Impression / Assessment and Plan / MDM / ED Course  86 y.o. female who presents to the ED for a non-syncopal fall w/ resultant R hip pain, presentation concerning for fracture. Exam as above.  Will obtain basic labs, imaging, IV pain control and reassess.  XR reveals right femoral neck fracture. Will discuss w/ orthopedics and plan to admit. Patient and family at bedside updated on results and plan of care.   _______________________________   As part of my medical decision making I have reviewed available labs, radiology tests, reviewed old records/performed chart review, obtained additional history from family, and discussed with consultants (orthopedics).     Final Clinical Impression(s) / ED Diagnosis  Final diagnoses:  Fall, initial encounter  Closed fracture of right hip, initial encounter [facility])       Note:  This document was prepared using Dragon voice recognition software and may include unintentional dictation errors.   Lilia Pro., MD 10/09/19 [phone]

## 2019-10-09 NOTE — ED Notes (Signed)
Admitting at bedside 

## 2019-10-09 NOTE — H&P (Signed)
History and Physical    Rue Valladares QPR:[id number] DOB: 06-11-1933 DOA: 10/09/2019  PCP: Venia Carbon, MD  Patient coming from: home [facility])  I have personally briefly reviewed patient's old medical records in [place]  Chief Complaint: right hip pain, unable to ambulate  HPI: Tiffany Martinez is a 86 y.o. female with medical history significant of chronic diastolic CHF, hypertension, venous insufficiency, peripheral artery disease, AAA, hyperlipidemia, chronic anemia, CKD stage IIIb, with history of CLL presented to the ED today after sustaining a mechanical fall at home and subsequently with right hip pain and inability to ambulate.  Patient states that she felt somewhat was at her front door and she got up to go check the door and tripped and fell.  She denies any prodromal symptoms including dizziness lightheadedness prior to the fall.  She does report that she is recently felt "off balance", but says it is not dizzy or lightheaded.  She describes when walking through a doorway she will bump her shoulder on the door.  No prior falls due to this.  She denies recent illnesses, fevers or chills, nausea vomiting or diarrhea, cough or shortness of breath or chest pain.  She denies any dysuria or urinary frequency.   ED Course: Hypertensive 188/61 with otherwise normal vitals.  Labs were notable for creatinine 1.42 BUN 31, potassium 3.3, glucose 114.  CBC is pending.  Right hip x-ray showed a femoral neck fracture.  Chest x-ray negative.  COVID-19 negative.  Orthopedics was consulted by the ED physician and plan to take patient to the OR tomorrow.  Patient is noted to hospitalist service for further management.  Review of Systems: As per HPI otherwise 10 point review of systems negative.    Past Medical History:  Diagnosis Date  . AAA (abdominal aortic aneurysm) ([place]) 2012   3.9cm, rec rpt 1 yr  . Anemia   . Anxiety   . CHF (congestive heart failure) ([place])   . Chronic  venous insufficiency   . CLL (chronic lymphocytic leukemia) ([place]) 2014   Stage 0--by flow cytometry  . Hx of colonoscopy   . Hyperlipidemia   . Hypertension   . Melanoma ([place])    Right Arm  . Migraine headache    3-4X/yr  . PAD (peripheral artery disease) ([place]) 2012   severe R common iliac stenosis, ABI 0.73, mid L common iliac stenosis ABI 1.1  . PAD (peripheral artery disease) ([place])   . Wears dentures    full upper and lower    Past Surgical History:  Procedure Laterality Date  . ABDOMINAL AORTIC ANEURYSM REPAIR  02/16/12   [facility]--Dr Sammuel Hines  . CARDIOVASCULAR STRESS TEST  2008   negative  . CATARACT EXTRACTION W/PHACO Left 07/16/2019   Procedure: CATARACT EXTRACTION PHACO AND INTRAOCULAR LENS PLACEMENT (IOC) LEFT 8.26  00:46.6;  Surgeon: Eulogio Bear, MD;  Location: [facility];  Service: Ophthalmology;  Laterality: Left;  . DOBUTAMINE STRESS ECHO  11/11   negative  . KNEE ARTHROSCOPY  1998   right  . MELANOMA EXCISION  11/09/10   wide excision right upper arm  . SQUAMOUS CELL CARCINOMA EXCISION  02/2016  . TONSILLECTOMY AND ADENOIDECTOMY  1960     reports that she has been smoking cigarettes. She has never used smokeless tobacco. She reports current alcohol use of about 7.0 standard drinks of alcohol per week. She reports that she does not use drugs.  Allergies  Allergen Reactions  . Triamterene Hives  Family History  Problem Relation Age of Onset  . Alzheimer's disease Mother   . Pancreatic cancer Father   . Parkinson's disease Brother   . Coronary artery disease Neg Hx   . Diabetes Neg Hx   . Cancer Neg Hx        breast or colon     Prior to Admission medications   Medication Sig Start Date End Date Taking? Authorizing Provider  amLODipine (NORVASC) 5 MG tablet TAKE 1 TABLET(5 MG) BY MOUTH DAILY Patient taking differently: Take 5 mg by mouth daily.  08/20/19  Yes Viviana Simpler I, MD  atorvastatin (LIPITOR) 20 MG tablet TAKE 1 TABLET(20 MG) BY  MOUTH DAILY 08/30/19  Yes Venia Carbon, MD  loratadine (CLARITIN) 10 MG tablet Take 10 mg by mouth daily.   Yes [provider]  potassium chloride SA (K-DUR) 20 MEQ tablet Take 1 tablet (20 mEq total) by mouth daily. 11/14/18  Yes Venia Carbon, MD  torsemide (DEMADEX) 20 MG tablet Take 1 tablet (20 mg total) by mouth as directed. Take 1 tablet (20 mg) every other day with extra as needed for swelling. 08/13/19 11/11/19 Yes Minna Merritts, MD    Physical Exam: Vitals:   10/09/19 1545 10/09/19 1700 10/09/19 1730 10/09/19 1731  BP:  (!) 178/90  (!) 129/101  Pulse: 70 60 64 65  Resp:  20 16 17   Temp:      TempSrc:      SpO2: 97% 96% 98% 100%  Weight:      Height:         Constitutional: NAD, calm, comfortable Eyes: EOMI, lids and conjunctivae normal ENMT: Mucous membranes are moist. Normal dentition.  Hearing grossly normal. Respiratory: CTAB, Normal respiratory effort. No accessory muscle use.  Cardiovascular: RRR, No extremity edema. 2+ pedal pulses.  Abdomen: soft, NT, ND, +Bowel sounds.  Musculoskeletal: no clubbing / cyanosis.  Right leg is shortened and externally rotated. Skin: dry, intact, normal color, normal temperature Neurologic: CN 2-12 grossly intact. Normal speech.  Grossly non-focal exam. Psychiatric: Alert and oriented x 3. Normal mood. Congruent affect.  Normal judgement and insight.   Labs on Admission: I have personally reviewed following labs and imaging studies  CBC: No results for input(s): WBC, NEUTROABS, HGB, HCT, MCV, PLT in the last 168 hours. Basic Metabolic Panel: Recent Labs  Lab 10/09/19 1526  NA 137  K 3.3*  CL 104  CO2 20*  GLUCOSE 114*  BUN 31*  CREATININE 1.42*  CALCIUM 9.2   GFR: Estimated Creatinine Clearance: 28.4 mL/min (A) (by C-G formula based on SCr of 1.42 mg/dL (H)). Liver Function Tests: No results for input(s): AST, ALT, ALKPHOS, BILITOT, PROT, ALBUMIN in the last 168 hours. No results for input(s):  LIPASE, AMYLASE in the last 168 hours. No results for input(s): AMMONIA in the last 168 hours. Coagulation Profile: No results for input(s): INR, PROTIME in the last 168 hours. Cardiac Enzymes: No results for input(s): CKTOTAL, CKMB, CKMBINDEX, TROPONINI in the last 168 hours. BNP (last 3 results) No results for input(s): PROBNP in the last 8760 hours. HbA1C: No results for input(s): HGBA1C in the last 72 hours. CBG: No results for input(s): GLUCAP in the last 168 hours. Lipid Profile: No results for input(s): CHOL, HDL, LDLCALC, TRIG, CHOLHDL, LDLDIRECT in the last 72 hours. Thyroid Function Tests: No results for input(s): TSH, T4TOTAL, FREET4, T3FREE, THYROIDAB in the last 72 hours. Anemia Panel: No results for input(s): VITAMINB12, FOLATE, FERRITIN, TIBC, IRON, RETICCTPCT  in the last 72 hours. Urine analysis:    Component Value Date/Time   COLORURINE YELLOW (A) 12/30/2015 1138   APPEARANCEUR CLEAR (A) 12/30/2015 1138   APPEARANCEUR Clear 08/21/2014 1141   LABSPEC 1.032 (H) 12/30/2015 1138   LABSPEC 1.017 08/21/2014 1141   PHURINE 5.0 12/30/2015 1138   GLUCOSEU NEGATIVE 12/30/2015 1138   GLUCOSEU Negative 08/21/2014 1141   HGBUR NEGATIVE 12/30/2015 1138   BILIRUBINUR NEGATIVE 12/30/2015 1138   BILIRUBINUR Negative 08/21/2014 1141   KETONESUR NEGATIVE 12/30/2015 1138   PROTEINUR NEGATIVE 12/30/2015 1138   UROBILINOGEN negative 01/26/2012 1215   NITRITE NEGATIVE 12/30/2015 1138   LEUKOCYTESUR NEGATIVE 12/30/2015 1138   LEUKOCYTESUR Negative 08/21/2014 1141    Radiological Exams on Admission: DG Chest 1 View  Result Date: 10/09/2019 CLINICAL DATA:  Status post fall. EXAM: CHEST  1 VIEW COMPARISON:  August 24, 2017 FINDINGS: Mild diffuse chronic appearing increased lung markings are seen without evidence of acute infiltrate, pleural effusion or pneumothorax. The heart size and mediastinal contours are within normal limits. There is marked severity calcification of the thoracic  aorta. In ill-defined lateral seventh left rib deformity is seen. IMPRESSION: No active disease. Electronically Signed   By: Virgina Norfolk M.D.   On: 10/09/2019 15:51   DG Hip Unilat  With Pelvis 2-3 Views Right  Result Date: 10/09/2019 CLINICAL DATA:  Fall EXAM: DG HIP (WITH OR WITHOUT PELVIS) 2-3V RIGHT COMPARISON:  None. FINDINGS: There is a fracture through the neck of the right femur with displacement and angulation. Probable calcified fibroid. Aortoiliac stents. IMPRESSION: Acute right femoral neck fracture with displacement and angulation. Electronically Signed   By: Macy Mis M.D.   On: 10/09/2019 15:47    EKG: Independently reviewed.   Assessment/Plan Principal Problem:   Closed right hip fracture (HCC) Active Problems:   Unsteady gait   Hypokalemia   Essential hypertension, benign   Chronic diastolic heart failure (HCC)   Hyperlipidemia    Closed right hip fracture - right femoral neck fracture sustained after mechanical fall today. --Ortho is following, Dr. Roland Rack --Plan is for surgery tomorrow afternoon --N.p.o. --Pain control per orders --Bowel regimen --PT eval after surgery  Hypokalemia -present on admission with K 3.3.  Replaced with oral.  Monitor and replace as needed.  Unsteady gait - patient describes recently feeling off balance and unsteady in her gait but does not think this contributed to her fall today.  PT evaluation after surgery.    Essential hypertension -uncontrolled on admission likely due to pain.  Continue home amlodipine.  Hold home diuretic for now.  PRN hydralazine IV.  Chronic diastolic heart failure - euvolemic on admission.  Hold home torsemide for now.  Monitor volume status.    Hyperlipidemia -continue statin.  DVT prophylaxis: SCDs Code Status: DNR Family Communication: None at bedside, will attempt to call POA Disposition Plan: Pending therapy evaluation postop, expect SNF needed Consults called: Orthopedics, Dr. Roland Rack saw  pt in ED Admission status: inpatient   Status is: Inpatient  Remains inpatient appropriate because:Right hip fracture going to surgery tomorrow   Dispo: The patient is from: Home              Anticipated d/c is to: SNF vs home with [place] (depending on post-PT eval)              Anticipated d/c date is: 3 days              Patient currently is not medically stable to d/c.  Ezekiel Slocumb, DO [facility]  10/09/2019, 6:23 PM    If 7PM-7AM, please contact night-coverage. How to contact the [facility] Attending or Consulting provider [place] or covering provider during after hours [place], for this patient?    1. Check the care team in [facility] and look for a) attending/consulting [facility] provider listed and b) the [facility] team listed 2. Log into [email or web address] and use [facility] [place]'s universal password to access. If you do not have the password, please contact the hospital operator. 3. Locate the [facility] provider you are looking for under [facility] and page to a number that you can be directly reached. 4. If you still have difficulty reaching the provider, please page the [facility] (Director on Call) for the Hospitalists listed on amion for assistance.

## 2019-10-09 NOTE — ED Notes (Signed)
Pt taken to xray via stretcher  

## 2019-10-09 NOTE — ED Notes (Signed)
Repeat lavender drawn and sent to lab at this time.

## 2019-10-10 ENCOUNTER — Encounter
Admission: EM | Disposition: A | Discharge status: Skilled Nursing Facility | Payer: Self-pay | Source: Home / Self Care | Attending: Internal Medicine

## 2019-10-10 ENCOUNTER — Inpatient Hospital Stay: Payer: Medicare Other | Admitting: Anesthesiology

## 2019-10-10 ENCOUNTER — Encounter: Payer: Self-pay | Admitting: Internal Medicine

## 2019-10-10 ENCOUNTER — Inpatient Hospital Stay: Payer: Medicare Other

## 2019-10-10 DIAGNOSIS — I5032 Chronic diastolic (congestive) heart failure: Secondary | ICD-10-CM

## 2019-10-10 DIAGNOSIS — E876 Hypokalemia: Secondary | ICD-10-CM

## 2019-10-10 DIAGNOSIS — S72001D Fracture of unspecified part of neck of right femur, subsequent encounter for closed fracture with routine healing: Secondary | ICD-10-CM

## 2019-10-10 DIAGNOSIS — R2681 Unsteadiness on feet: Secondary | ICD-10-CM

## 2019-10-10 DIAGNOSIS — I10 Essential (primary) hypertension: Secondary | ICD-10-CM

## 2019-10-10 HISTORY — PX: HIP ARTHROPLASTY: SHX981

## 2019-10-10 LAB — BASIC METABOLIC PANEL
Anion gap: 10 (ref 5–15)
BUN: 25 mg/dL — ABNORMAL HIGH (ref 8–23)
CO2: 26 mmol/L (ref 22–32)
Calcium: 9.3 mg/dL (ref 8.9–10.3)
Chloride: 104 mmol/L (ref 98–111)
Creatinine, Ser: 1.41 mg/dL — ABNORMAL HIGH (ref 0.44–1.00)
GFR calc Af Amer: 40 mL/min — ABNORMAL LOW (ref >60)
GFR calc non Af Amer: 34 mL/min — ABNORMAL LOW (ref >60)
Glucose, Bld: 106 mg/dL — ABNORMAL HIGH (ref 70–99)
Potassium: 2.9 mmol/L — ABNORMAL LOW (ref 3.5–5.1)
Sodium: 140 mmol/L (ref 135–145)

## 2019-10-10 LAB — CBC WITH DIFFERENTIAL/PLATELET
Abs Immature Granulocytes: 0.06 10*3/uL (ref 0.00–0.07)
Basophils Absolute: 0.1 10*3/uL (ref 0.0–0.1)
Basophils Relative: 0 %
Eosinophils Absolute: 0.2 10*3/uL (ref 0.0–0.5)
Eosinophils Relative: 1 %
HCT: 32.1 % — ABNORMAL LOW (ref 36.0–46.0)
Hemoglobin: 11.3 g/dL — ABNORMAL LOW (ref 12.0–15.0)
Immature Granulocytes: 0 %
Lymphocytes Relative: 61 %
Lymphs Abs: 12.2 10*3/uL — ABNORMAL HIGH (ref 0.7–4.0)
MCH: 34.5 pg — ABNORMAL HIGH (ref 26.0–34.0)
MCHC: 35.2 g/dL (ref 30.0–36.0)
MCV: 97.9 fL (ref 80.0–100.0)
Monocytes Absolute: 1.3 10*3/uL — ABNORMAL HIGH (ref 0.1–1.0)
Monocytes Relative: 6 %
Neutro Abs: 6.4 10*3/uL (ref 1.7–7.7)
Neutrophils Relative %: 32 %
Platelets: 139 10*3/uL — ABNORMAL LOW (ref 150–400)
RBC: 3.28 MIL/uL — ABNORMAL LOW (ref 3.87–5.11)
RDW: 15.7 % — ABNORMAL HIGH (ref 11.5–15.5)
Smear Review: NORMAL
WBC: 20.2 10*3/uL — ABNORMAL HIGH (ref 4.0–10.5)
nRBC: 0 % (ref 0.0–0.2)

## 2019-10-10 LAB — PATHOLOGIST SMEAR REVIEW

## 2019-10-10 LAB — PROTIME-INR
INR: 1 (ref 0.8–1.2)
Prothrombin Time: 13.1 seconds (ref 11.4–15.2)

## 2019-10-10 LAB — TYPE AND SCREEN
ABO/RH(D): A POS
Antibody Screen: NEGATIVE
PT AG Type: NEGATIVE

## 2019-10-10 LAB — POCT I-STAT, CHEM 8
BUN: 22 mg/dL (ref 8–23)
Calcium, Ion: 1.28 mmol/L (ref 1.15–1.40)
Chloride: 101 mmol/L (ref 98–111)
Creatinine, Ser: 1.5 mg/dL — ABNORMAL HIGH (ref 0.44–1.00)
Glucose, Bld: 107 mg/dL — ABNORMAL HIGH (ref 70–99)
HCT: 27 % — ABNORMAL LOW (ref 36.0–46.0)
Hemoglobin: 9.2 g/dL — ABNORMAL LOW (ref 12.0–15.0)
Potassium: 3.6 mmol/L (ref 3.5–5.1)
Sodium: 139 mmol/L (ref 135–145)
TCO2: 26 mmol/L (ref 22–32)

## 2019-10-10 LAB — SURGICAL PCR SCREEN
MRSA, PCR: NEGATIVE
Staphylococcus aureus: NEGATIVE

## 2019-10-10 LAB — APTT: aPTT: 39 seconds — ABNORMAL HIGH (ref 24–36)

## 2019-10-10 LAB — MAGNESIUM: Magnesium: 2.1 mg/dL (ref 1.7–2.4)

## 2019-10-10 SURGERY — HEMIARTHROPLASTY, HIP, DIRECT ANTERIOR APPROACH, FOR FRACTURE
Anesthesia: Spinal | Site: Hip | Laterality: Right

## 2019-10-10 MED ORDER — FLEET ENEMA 7-19 GM/118ML RE ENEM: 1.0000 | ENEMA | Freq: Once | RECTAL | Status: DC | PRN

## 2019-10-10 MED ORDER — BUPIVACAINE HCL (PF) 0.5 % IJ SOLN
INTRAMUSCULAR | Status: AC
  Filled 2019-10-10: qty 10

## 2019-10-10 MED ORDER — ACETAMINOPHEN 10 MG/ML IV SOLN
INTRAVENOUS | Status: DC | PRN
  Administered 2019-10-10: 1000 mg via INTRAVENOUS

## 2019-10-10 MED ORDER — GLYCOPYRROLATE 0.2 MG/ML IJ SOLN
INTRAMUSCULAR | Status: AC
  Filled 2019-10-10: qty 1

## 2019-10-10 MED ORDER — DIPHENHYDRAMINE HCL 12.5 MG/5ML PO ELIX: 12.5000 mg | ORAL_SOLUTION | ORAL | Status: DC | PRN

## 2019-10-10 MED ORDER — BUPIVACAINE HCL (PF) 0.5 % IJ SOLN
INTRAMUSCULAR | Status: DC | PRN
  Administered 2019-10-10: 2.8 mL via INTRATHECAL

## 2019-10-10 MED ORDER — PHENYLEPHRINE HCL (PRESSORS) 10 MG/ML IV SOLN
INTRAVENOUS | Status: AC
  Filled 2019-10-10: qty 1

## 2019-10-10 MED ORDER — METOCLOPRAMIDE HCL 10 MG PO TABS: 5.0000 mg | ORAL_TABLET | Freq: Three times a day (TID) | ORAL | Status: DC | PRN

## 2019-10-10 MED ORDER — CEFAZOLIN SODIUM-DEXTROSE 2-4 GM/100ML-% IV SOLN
2.0000 g | Freq: Four times a day (QID) | INTRAVENOUS | Status: AC
  Administered 2019-10-10 – 2019-10-11 (×3): 2 g via INTRAVENOUS
  Filled 2019-10-10 (×6): qty 100

## 2019-10-10 MED ORDER — BUPIVACAINE LIPOSOME 1.3 % IJ SUSP
INTRAMUSCULAR | Status: AC
  Filled 2019-10-10: qty 20

## 2019-10-10 MED ORDER — BUPIVACAINE-EPINEPHRINE (PF) 0.5% -1:200000 IJ SOLN
INTRAMUSCULAR | Status: AC
  Filled 2019-10-10: qty 30

## 2019-10-10 MED ORDER — ONDANSETRON HCL 4 MG/2ML IJ SOLN: 4.0000 mg | Freq: Once | INTRAMUSCULAR | Status: DC | PRN

## 2019-10-10 MED ORDER — TRANEXAMIC ACID 1000 MG/10ML IV SOLN
INTRAVENOUS | Status: DC | PRN
  Administered 2019-10-10: 1000 mg via INTRAVENOUS

## 2019-10-10 MED ORDER — FENTANYL CITRATE (PF) 100 MCG/2ML IJ SOLN
INTRAMUSCULAR | Status: DC | PRN
  Administered 2019-10-10 (×2): 50 ug via INTRAVENOUS

## 2019-10-10 MED ORDER — BISACODYL 10 MG RE SUPP
10.0000 mg | Freq: Every day | RECTAL | Status: DC | PRN
  Administered 2019-10-12: 10 mg via RECTAL
  Filled 2019-10-10: qty 1

## 2019-10-10 MED ORDER — ACETAMINOPHEN 325 MG PO TABS
325.0000 mg | ORAL_TABLET | Freq: Four times a day (QID) | ORAL | Status: DC | PRN
  Administered 2019-10-12: 650 mg via ORAL
  Filled 2019-10-10: qty 2

## 2019-10-10 MED ORDER — MAGNESIUM HYDROXIDE 400 MG/5ML PO SUSP
30.0000 mL | Freq: Every day | ORAL | Status: DC | PRN
  Administered 2019-10-11 – 2019-10-12 (×2): 30 mL via ORAL
  Filled 2019-10-10 (×2): qty 30

## 2019-10-10 MED ORDER — SODIUM CHLORIDE (PF) 0.9 % IJ SOLN
INTRAMUSCULAR | Status: AC
  Filled 2019-10-10: qty 50

## 2019-10-10 MED ORDER — SODIUM CHLORIDE 0.9 % IV SOLN: INTRAVENOUS | Status: DC

## 2019-10-10 MED ORDER — ONDANSETRON HCL 4 MG/2ML IJ SOLN
INTRAMUSCULAR | Status: AC
  Filled 2019-10-10: qty 2

## 2019-10-10 MED ORDER — TRANEXAMIC ACID 1000 MG/10ML IV SOLN
INTRAVENOUS | Status: AC
  Filled 2019-10-10: qty 10

## 2019-10-10 MED ORDER — CHLORHEXIDINE GLUCONATE CLOTH 2 % EX PADS
6.0000 | MEDICATED_PAD | Freq: Every day | CUTANEOUS | Status: DC
  Administered 2019-10-11: 6 via TOPICAL

## 2019-10-10 MED ORDER — GLYCOPYRROLATE 0.2 MG/ML IJ SOLN
INTRAMUSCULAR | Status: DC | PRN
  Administered 2019-10-10: .2 mg via INTRAVENOUS

## 2019-10-10 MED ORDER — SODIUM CHLORIDE 0.9 % IV SOLN
INTRAVENOUS | Status: DC | PRN
  Administered 2019-10-10: 10 ug/min via INTRAVENOUS

## 2019-10-10 MED ORDER — PROPOFOL 10 MG/ML IV BOLUS
INTRAVENOUS | Status: DC | PRN
  Administered 2019-10-10: 20 mg via INTRAVENOUS
  Administered 2019-10-10: 30 mg via INTRAVENOUS

## 2019-10-10 MED ORDER — ONDANSETRON HCL 4 MG/2ML IJ SOLN
4.0000 mg | Freq: Four times a day (QID) | INTRAMUSCULAR | Status: DC | PRN
  Filled 2019-10-10: qty 2

## 2019-10-10 MED ORDER — POTASSIUM CHLORIDE CRYS ER 20 MEQ PO TBCR: 40.0000 meq | EXTENDED_RELEASE_TABLET | Freq: Once | ORAL | Status: DC

## 2019-10-10 MED ORDER — ENOXAPARIN SODIUM 30 MG/0.3ML ~~LOC~~ SOLN
30.0000 mg | SUBCUTANEOUS | Status: DC
  Administered 2019-10-11 – 2019-10-12 (×2): 30 mg via SUBCUTANEOUS
  Filled 2019-10-10 (×2): qty 0.3

## 2019-10-10 MED ORDER — ONDANSETRON HCL 4 MG PO TABS
4.0000 mg | ORAL_TABLET | Freq: Four times a day (QID) | ORAL | Status: DC | PRN
  Administered 2019-10-11: 4 mg via ORAL
  Filled 2019-10-10: qty 1

## 2019-10-10 MED ORDER — ACETAMINOPHEN 500 MG PO TABS
500.0000 mg | ORAL_TABLET | Freq: Four times a day (QID) | ORAL | Status: AC
  Administered 2019-10-10 – 2019-10-11 (×3): 500 mg via ORAL
  Filled 2019-10-10 (×4): qty 1

## 2019-10-10 MED ORDER — PROPOFOL 500 MG/50ML IV EMUL
INTRAVENOUS | Status: DC | PRN
  Administered 2019-10-10: 120 ug/kg/min via INTRAVENOUS

## 2019-10-10 MED ORDER — LACTATED RINGERS IV SOLN: INTRAVENOUS | Status: DC

## 2019-10-10 MED ORDER — DOCUSATE SODIUM 100 MG PO CAPS
100.0000 mg | ORAL_CAPSULE | Freq: Two times a day (BID) | ORAL | Status: DC
  Administered 2019-10-10 – 2019-10-12 (×4): 100 mg via ORAL
  Filled 2019-10-10 (×4): qty 1

## 2019-10-10 MED ORDER — ACETAMINOPHEN 10 MG/ML IV SOLN
INTRAVENOUS | Status: AC
  Filled 2019-10-10: qty 100

## 2019-10-10 MED ORDER — PROPOFOL 500 MG/50ML IV EMUL
INTRAVENOUS | Status: AC
  Filled 2019-10-10: qty 50

## 2019-10-10 MED ORDER — FENTANYL CITRATE (PF) 100 MCG/2ML IJ SOLN
INTRAMUSCULAR | Status: AC
  Filled 2019-10-10: qty 2

## 2019-10-10 MED ORDER — BUPIVACAINE LIPOSOME 1.3 % IJ SUSP
INTRAMUSCULAR | Status: DC | PRN
  Administered 2019-10-10: 20 mL

## 2019-10-10 MED ORDER — POTASSIUM CHLORIDE CRYS ER 20 MEQ PO TBCR
40.0000 meq | EXTENDED_RELEASE_TABLET | ORAL | Status: AC
  Administered 2019-10-10 (×2): 40 meq via ORAL
  Filled 2019-10-10 (×2): qty 2

## 2019-10-10 MED ORDER — METOCLOPRAMIDE HCL 5 MG/ML IJ SOLN: 5.0000 mg | Freq: Three times a day (TID) | INTRAMUSCULAR | Status: DC | PRN

## 2019-10-10 MED ORDER — BUPIVACAINE-EPINEPHRINE (PF) 0.5% -1:200000 IJ SOLN
INTRAMUSCULAR | Status: DC | PRN
  Administered 2019-10-10: 30 mL via PERINEURAL

## 2019-10-10 MED ORDER — PROPOFOL 10 MG/ML IV BOLUS
INTRAVENOUS | Status: AC
  Filled 2019-10-10: qty 20

## 2019-10-10 MED ORDER — FENTANYL CITRATE (PF) 100 MCG/2ML IJ SOLN: 25.0000 ug | INTRAMUSCULAR | Status: DC | PRN

## 2019-10-10 MED ORDER — TRAMADOL HCL 50 MG PO TABS: 50.0000 mg | ORAL_TABLET | Freq: Four times a day (QID) | ORAL | Status: DC | PRN

## 2019-10-10 MED ORDER — SODIUM CHLORIDE FLUSH 0.9 % IV SOLN
INTRAVENOUS | Status: AC
  Filled 2019-10-10: qty 40

## 2019-10-10 SURGICAL SUPPLY — 64 items
BAG DECANTER FOR FLEXI CONT (MISCELLANEOUS) IMPLANT
BLADE SAGITTAL WIDE XTHICK NO (BLADE) ×3 IMPLANT
BLADE SURG SZ20 CARB STEEL (BLADE) ×3 IMPLANT
BNDG COHESIVE 6X5 TAN STRL LF (GAUZE/BANDAGES/DRESSINGS) ×3 IMPLANT
BOWL CEMENT MIXING ADV NOZZLE (MISCELLANEOUS) IMPLANT
CANISTER SUCT 1200ML W/VALVE (MISCELLANEOUS) ×3 IMPLANT
CANISTER SUCT 3000ML PPV (MISCELLANEOUS) ×6 IMPLANT
CHLORAPREP W/TINT 26 (MISCELLANEOUS) ×6 IMPLANT
COVER WAND RF STERILE (DRAPES) ×3 IMPLANT
DECANTER SPIKE VIAL GLASS SM (MISCELLANEOUS) ×6 IMPLANT
DRAPE 3/4 80X56 (DRAPES) ×3 IMPLANT
DRAPE INCISE IOBAN 66X60 STRL (DRAPES) ×3 IMPLANT
DRAPE SPLIT 6X30 W/TAPE (DRAPES) ×6 IMPLANT
DRAPE SURG 17X11 SM STRL (DRAPES) ×3 IMPLANT
DRAPE SURG 17X23 STRL (DRAPES) ×3 IMPLANT
DRSG OPSITE POSTOP 4X12 (GAUZE/BANDAGES/DRESSINGS) IMPLANT
DRSG OPSITE POSTOP 4X14 (GAUZE/BANDAGES/DRESSINGS) IMPLANT
DRSG OPSITE POSTOP 4X8 (GAUZE/BANDAGES/DRESSINGS) ×6 IMPLANT
ELECT BLADE 6.5 EXT (BLADE) ×3 IMPLANT
ELECT CAUTERY BLADE 6.4 (BLADE) ×3 IMPLANT
ELECT REM PT RETURN 9FT ADLT (ELECTROSURGICAL) ×3
ELECTRODE REM PT RTRN 9FT ADLT (ELECTROSURGICAL) ×1 IMPLANT
GAUZE PACK 2X3YD (GAUZE/BANDAGES/DRESSINGS) IMPLANT
GLOVE BIO SURGEON STRL SZ8 (GLOVE) ×6 IMPLANT
GLOVE BIOGEL M STRL SZ7.5 (GLOVE) ×3 IMPLANT
GLOVE BIOGEL PI IND STRL 8 (GLOVE) ×1 IMPLANT
GLOVE BIOGEL PI INDICATOR 8 (GLOVE) ×2
GLOVE INDICATOR 8.0 STRL GRN (GLOVE) ×3 IMPLANT
GOWN STRL REUS W/ TWL LRG LVL3 (GOWN DISPOSABLE) ×2 IMPLANT
GOWN STRL REUS W/ TWL XL LVL3 (GOWN DISPOSABLE) ×1 IMPLANT
GOWN STRL REUS W/TWL LRG LVL3 (GOWN DISPOSABLE) ×4
GOWN STRL REUS W/TWL XL LVL3 (GOWN DISPOSABLE) ×2
HEAD ENDO II MOD SZ 46 (Orthopedic Implant) ×3 IMPLANT
HOOD PEEL AWAY FLYTE STAYCOOL (MISCELLANEOUS) ×12 IMPLANT
INSERT TAPER ENDO II -3 (Orthopedic Implant) ×3 IMPLANT
IV NS 100ML SINGLE PACK (IV SOLUTION) IMPLANT
LABEL OR SOLS (LABEL) ×3 IMPLANT
NDL SAFETY ECLIPSE 18X1.5 (NEEDLE) ×1 IMPLANT
NEEDLE FILTER BLUNT 18X 1/2SAF (NEEDLE) ×2
NEEDLE FILTER BLUNT 18X1 1/2 (NEEDLE) ×1 IMPLANT
NEEDLE HYPO 18GX1.5 SHARP (NEEDLE) ×2
NEEDLE SPNL 20GX3.5 QUINCKE YW (NEEDLE) ×3 IMPLANT
NS IRRIG 1000ML POUR BTL (IV SOLUTION) ×3 IMPLANT
PACK HIP PROSTHESIS (MISCELLANEOUS) ×3 IMPLANT
PULSAVAC PLUS IRRIG FAN TIP (DISPOSABLE) ×3
SOL .9 NS 3000ML IRR  AL (IV SOLUTION) ×2
SOL .9 NS 3000ML IRR UROMATIC (IV SOLUTION) ×1 IMPLANT
STAPLER SKIN PROX 35W (STAPLE) ×3 IMPLANT
STEM FEMORAL 9X15MM HIP 130D (Stem) ×3 IMPLANT
STRAP SAFETY 5IN WIDE (MISCELLANEOUS) IMPLANT
SUT ETHIBOND 2 V 37 (SUTURE) ×3 IMPLANT
SUT VIC AB 0 CT1 36 (SUTURE) ×3 IMPLANT
SUT VIC AB 1 CT1 36 (SUTURE) IMPLANT
SUT VIC AB 2-0 CT1 (SUTURE) ×6 IMPLANT
SUT VIC AB 2-0 CT1 27 (SUTURE)
SUT VIC AB 2-0 CT1 TAPERPNT 27 (SUTURE) IMPLANT
SUT VICRYL 1-0 27IN ABS (SUTURE) ×6
SUTURE VICRYL 1-0 27IN ABS (SUTURE) ×2 IMPLANT
SYR 10ML LL (SYRINGE) ×3 IMPLANT
SYR 30ML LL (SYRINGE) ×9 IMPLANT
SYR TB 1ML 27GX1/2 LL (SYRINGE) IMPLANT
TAPE TRANSPORE STRL 2 31045 (GAUZE/BANDAGES/DRESSINGS) ×3 IMPLANT
TIP BRUSH PULSAVAC PLUS 24.33 (MISCELLANEOUS) ×3 IMPLANT
TIP FAN IRRIG PULSAVAC PLUS (DISPOSABLE) ×1 IMPLANT

## 2019-10-10 NOTE — Progress Notes (Signed)
Spoke with Dr. Bertell Maria about pt K 2.9 this am, per Dr. Bertell Maria pt is to receive her PO potassium on floor prior to surgery and to recheck K in pre-op. This RN obtained report from June on 1A, informed that pt is to receive both doses of potassium prior to procedure this am.

## 2019-10-10 NOTE — Progress Notes (Signed)
Progress Note    Tiffany Martinez  DXA:[id number] DOB: 12-27-1933  DOA: 10/09/2019 PCP: Venia Carbon, MD      Brief Narrative:    Medical records reviewed and are as summarized below:  Tiffany Martinez is an 86 y.o. female with medical history significant of chronic diastolic CHF, hypertension, venous insufficiency, peripheral artery disease, AAA, hyperlipidemia, chronic anemia, CKD stage IIIb, with history of CLL presented to the ED on 10/09/2019 after sustaining a mechanical fall at home and subsequently with right hip pain and inability to ambulate.  Work-up revealed closed right hip fracture.  She was treated with analgesics.  She was seen in consultation by orthopedic surgeon, Dr. Roland Rack.      Assessment/Plan:   Principal Problem:   Closed right hip fracture (HCC) Active Problems:   Hyperlipidemia   Essential hypertension, benign   Chronic diastolic heart failure (HCC)   Unsteady gait   Hypokalemia   Closed right hip fracture - right femoral neck fracture sustained after mechanical fall  Plan for surgical intervention today.  Follow-up with orthopedic surgeon.  Analgesics as needed for pain.  Hypokalemia -potassium dropped from 3.3 yesterday to 2.9 today.  Replete potassium and monitor levels.   Unsteady gait - PT and OT evaluation after surgery.   Essential hypertension -continue antihypertensives  Chronic diastolic heart failure - compensated.   Torsemide on hold. Monitor volume status.    Hyperlipidemia -continue statin.  Prolonged QT interval (543): Probably from hypokalemia.  Repeat EKG when potassium is replete.  Chronic leukocytosis from chronic lymphocytic leukemia (CLL): No acute issues    Body mass index is 25.75 kg/m.   Family Communication/Anticipated D/C date and plan/Code Status   DVT prophylaxis: To be determined by orthopedic surgeon after surgery Code Status: DNR Family Communication: Plan discussed with  patient Disposition Plan:    Status is: Inpatient  Remains inpatient appropriate because:Unsafe d/c plan and Inpatient level of care appropriate due to severity of illness   Dispo: The patient is from: Home              Anticipated d/c is to: SNF              Anticipated d/c date is: 3 days              Patient currently is not medically stable to d/c.            Subjective:   She complains of severe pain in the right hip.  Objective:    Vitals:   10/09/19 2130 10/09/19 2230 10/10/19 0027 10/10/19 0807  BP: (!) 168/59 (!) 157/60 (!) 152/62 (!) 172/65  Pulse: 62 67 64 69  Resp: 11 20 16 18   Temp:  (!) 97.5 F (36.4 C) 98.1 F (36.7 C) 98.4 F (36.9 C)  TempSrc:    Oral  SpO2: 93% 93% 92% 92%  Weight:      Height:       No data found.   Intake/Output Summary (Last 24 hours) at 10/10/2019 0933 Last data filed at 10/09/2019 2237 Gross per 24 hour  Intake --  Output 950 ml  Net -950 ml   Filed Weights   10/09/19 1447  Weight: 68 kg    Exam:  GEN: NAD SKIN: No rash EYES: EOMI ENT: MMM CV: RRR PULM: CTA B ABD: soft, ND, NT, +BS CNS: AAO x 3, non focal EXT: Right hip swelling and tenderness   Data Reviewed:   I have personally reviewed  following labs and imaging studies:  Labs: Labs show the following:   Basic Metabolic Panel: Recent Labs  Lab 10/09/19 1526 10/10/19 0458  NA 137 140  K 3.3* 2.9*  CL 104 104  CO2 20* 26  GLUCOSE 114* 106*  BUN 31* 25*  CREATININE 1.42* 1.41*  CALCIUM 9.2 9.3  MG  --  2.1   GFR Estimated Creatinine Clearance: 28.6 mL/min (A) (by C-G formula based on SCr of 1.41 mg/dL (H)). Liver Function Tests: No results for input(s): AST, ALT, ALKPHOS, BILITOT, PROT, ALBUMIN in the last 168 hours. No results for input(s): LIPASE, AMYLASE in the last 168 hours. No results for input(s): AMMONIA in the last 168 hours. Coagulation profile Recent Labs  Lab 10/10/19 0458  INR 1.0    CBC: Recent Labs  Lab  10/09/19 1711 10/10/19 0458  WBC 26.3* 20.2*  NEUTROABS 22.1* 6.4  HGB 11.2* 11.3*  HCT 31.9* 32.1*  MCV 97.9 97.9  PLT 143* 139*   Cardiac Enzymes: No results for input(s): CKTOTAL, CKMB, CKMBINDEX, TROPONINI in the last 168 hours. BNP (last 3 results) No results for input(s): PROBNP in the last 8760 hours. CBG: No results for input(s): GLUCAP in the last 168 hours. D-Dimer: No results for input(s): DDIMER in the last 72 hours. Hgb A1c: No results for input(s): HGBA1C in the last 72 hours. Lipid Profile: No results for input(s): CHOL, HDL, LDLCALC, TRIG, CHOLHDL, LDLDIRECT in the last 72 hours. Thyroid function studies: No results for input(s): TSH, T4TOTAL, T3FREE, THYROIDAB in the last 72 hours.  Invalid input(s): FREET3 Anemia work up: No results for input(s): VITAMINB12, FOLATE, FERRITIN, TIBC, IRON, RETICCTPCT in the last 72 hours. Sepsis Labs: Recent Labs  Lab 10/09/19 1711 10/10/19 0458  WBC 26.3* 20.2*    Microbiology Recent Results (from the past 240 hour(s))  SARS Coronavirus 2 by RT PCR (hospital order, performed in [facility] hospital lab) Nasopharyngeal Nasopharyngeal Swab     Status: None   Collection Time: 10/09/19  4:51 PM   Specimen: Nasopharyngeal Swab  Result Value Ref Range Status   SARS Coronavirus 2 NEGATIVE NEGATIVE Final    Comment: (NOTE) SARS-CoV-2 target nucleic acids are NOT DETECTED. The SARS-CoV-2 RNA is generally detectable in upper and lower respiratory specimens during the acute phase of infection. The lowest concentration of SARS-CoV-2 viral copies this assay can detect is 250 copies / mL. A negative result does not preclude SARS-CoV-2 infection and should not be used as the sole basis for treatment or other patient management decisions.  A negative result may occur with improper specimen collection / handling, submission of specimen other than nasopharyngeal swab, presence of viral mutation(s) within the areas targeted by this  assay, and inadequate number of viral copies (<250 copies / mL). A negative result must be combined with clinical observations, patient history, and epidemiological information. Fact Sheet for Patients:   StrictlyIdeas.no Fact Sheet for Healthcare Providers: BankingDealers.co.za This test is not yet approved or cleared  by the [place] FDA and has been authorized for detection and/or diagnosis of SARS-CoV-2 by FDA under an Emergency Use Authorization (EUA).  This EUA will remain in effect (meaning this test can be used) for the duration of the COVID-19 declaration under Section 564(b)(1) of the Act, 21 U.S.C. section 360bbb-3(b)(1), unless the authorization is terminated or revoked sooner. Performed at [facility], [address] [place], [place] [address]   Surgical pcr screen     Status: None   Collection Time: 10/09/19  9:38 PM   Specimen: Nasal Mucosa; Nasal Swab  Result Value Ref Range Status   MRSA, PCR NEGATIVE NEGATIVE Final   Staphylococcus aureus NEGATIVE NEGATIVE Final    Comment: (NOTE) The Xpert SA Assay (FDA approved for NASAL specimens in patients 105 years of age and older), is one component of a comprehensive surveillance program. It is not intended to diagnose infection nor to guide or monitor treatment. Performed at [facility], [address], [place], [place] [address]     Procedures and diagnostic studies:  DG Chest 1 View  Result Date: 10/09/2019 CLINICAL DATA:  Status post fall. EXAM: CHEST  1 VIEW COMPARISON:  August 24, 2017 FINDINGS: Mild diffuse chronic appearing increased lung markings are seen without evidence of acute infiltrate, pleural effusion or pneumothorax. The heart size and mediastinal contours are within normal limits. There is marked severity calcification of the thoracic aorta. In ill-defined lateral seventh left rib deformity is seen. IMPRESSION: No active disease.  Electronically Signed   By: Virgina Norfolk M.D.   On: 10/09/2019 15:51   DG Hip Unilat  With Pelvis 2-3 Views Right  Result Date: 10/09/2019 CLINICAL DATA:  Fall EXAM: DG HIP (WITH OR WITHOUT PELVIS) 2-3V RIGHT COMPARISON:  None. FINDINGS: There is a fracture through the neck of the right femur with displacement and angulation. Probable calcified fibroid. Aortoiliac stents. IMPRESSION: Acute right femoral neck fracture with displacement and angulation. Electronically Signed   By: Macy Mis M.D.   On: 10/09/2019 15:47    Medications:   . amLODipine  5 mg Oral Daily  . atorvastatin  10 mg Oral Daily  . loratadine  10 mg Oral Daily  . potassium chloride  40 mEq Oral Q4H   Continuous Infusions: .  ceFAZolin (ANCEF) IV       LOS: 1 day   Maiyah Goyne  [facility]     10/10/2019, 9:33 AM

## 2019-10-10 NOTE — Anesthesia Preprocedure Evaluation (Signed)
Anesthesia Evaluation  Patient identified by MRN, date of birth, ID band Patient awake    Reviewed: Allergy & Precautions, H&P , NPO status , Patient's Chart, lab work & pertinent test results, reviewed documented beta blocker date and time   History of Anesthesia Complications Negative for: history of anesthetic complications  Airway Mallampati: II  TM Distance: >3 FB Neck ROM: full    Dental  (+) Dental Advidsory Given   Pulmonary shortness of breath and with exertion, neg sleep apnea, COPD, neg recent URI, Current Smoker and Patient abstained from smoking.,    Pulmonary exam normal breath sounds clear to auscultation       Cardiovascular Exercise Tolerance: Good hypertension, (-) angina+ Peripheral Vascular Disease and +CHF  (-) Past MI and (-) Cardiac Stents Normal cardiovascular exam(-) dysrhythmias (-) Valvular Problems/Murmurs Rhythm:regular Rate:Normal     Neuro/Psych PSYCHIATRIC DISORDERS Anxiety negative neurological ROS     GI/Hepatic negative GI ROS, Neg liver ROS,   Endo/Other  negative endocrine ROS  Renal/GU CRFRenal disease  negative genitourinary   Musculoskeletal   Abdominal   Peds  Hematology negative hematology ROS (+)   Anesthesia Other Findings Past Medical History: 2012: AAA (abdominal aortic aneurysm) (HCC)     Comment:  3.9cm, rec rpt 1 yr No date: Anemia No date: Anxiety No date: CHF (congestive heart failure) (HCC) No date: Chronic venous insufficiency 2014: CLL (chronic lymphocytic leukemia) (HCC)     Comment:  Stage 0--by flow cytometry No date: Hx of colonoscopy No date: Hyperlipidemia No date: Hypertension No date: Melanoma ([place])     Comment:  Right Arm No date: Migraine headache     Comment:  3-4X/yr 2012: PAD (peripheral artery disease) ([place])     Comment:  severe R common iliac stenosis, ABI 0.73, mid L common               iliac stenosis ABI 1.1 No date: PAD (peripheral  artery disease) (HCC) No date: Wears dentures     Comment:  full upper and lower   Reproductive/Obstetrics negative OB ROS                             Anesthesia Physical Anesthesia Plan  ASA: III  Anesthesia Plan: Spinal   Post-op Pain Management:    Induction:   PONV Risk Score and Plan: 1 and Propofol infusion and TIVA  Airway Management Planned: Natural Airway and Simple Face Mask  Additional Equipment:   Intra-op Plan:   Post-operative Plan:   Informed Consent: I have reviewed the patients History and Physical, chart, labs and discussed the procedure including the risks, benefits and alternatives for the proposed anesthesia with the patient or authorized representative who has indicated his/her understanding and acceptance.     Dental Advisory Given  Plan Discussed with: Anesthesiologist, CRNA and Surgeon  Anesthesia Plan Comments:         Anesthesia Quick Evaluation

## 2019-10-10 NOTE — Op Note (Signed)
10/10/2019  2:57 PM  Patient:   Tiffany Martinez  Pre-Op Diagnosis:   Displaced femoral neck fracture, right hip.  Post-Op Diagnosis:   Same.  Procedure:   Right hip unipolar hemiarthroplasty.  Surgeon:   Pascal Lux, MD  Assistant:   Cameron Proud, PA-C  Anesthesia:   Spinal  Findings:   As above.  Complications:   None  EBL:   75 cc  Fluids:   500 cc crystalloid  UOP:   450 cc  TT:   None  Drains:   None  Closure:   Staples  Implants:   Biomet press-fit system with a #9 laterally offset reduced proximal profile Echo femoral stem, a 46 mm outer diameter shell, and a -3 mm neck adapter.  Brief Clinical Note:   The patient is an 86 year old female who lives in a retirement community who was in her usual state of health yesterday when she apparently lost her balance and fell, landing on her right hip. She was brought to the emergency room where x-rays demonstrated the above-noted injury. The patient has been cleared medically and presents at this time for definitive management of the injury.  Procedure:   The patient was brought into the operating room. After adequate spinal anesthesia was obtained, the patient was repositioned in the left lateral decubitus position and secured using a lateral hip positioner. The right hip and lower extremity were prepped with ChloroPrep solution before being draped sterilely. Preoperative antibiotics were administered. A timeout was performed to verify the appropriate surgical site before a standard posterior approach to the hip was made through an approximately 4-5 inch incision. The incision was carried down through the subcutaneous tissues to expose the gluteal fascia and proximal end of the iliotibial band. These structures were split the length of the incision and the Charnley self-retaining hip retractor placed. The bursal tissues were swept posteriorly to expose the short external rotators. The anterior border of the piriformis tendon  was identified and this plane developed down through the capsule to enter the joint. Abundant fracture hematoma was suctioned. A flap of tissue was elevated off the posterior aspect of the femoral neck and greater trochanter and retracted posteriorly. This flap included the piriformis tendon, the short external rotators, and the posterior capsule. The femoral head was removed in its entirety, then taken to the back table where it was measured and found to be optimally replicated by a 46 mm head. The appropriate trial head was inserted and found to demonstrate an excellent suction fit.   Attention was directed to the femoral side. The femoral neck was recut 10-12 mm above the lesser trochanter using an oscillating saw. The piriformis fossa was debrided of soft tissues before the intramedullary canal was accessed through this point using a triple step reamer. The canal was reamed sequentially beginning with a #7 tapered reamer and progressing to a #10 tapered reamer. This provided excellent circumferential chatter. A box osteotome was used to establish version before the canal was broached sequentially beginning with a #7 broach and progressing to a #9 broach. This size was felt to be quite tight and it was felt best not to progress to a #10 broach. The #9 broach was left in place and several trial reductions performed using both the standard and laterally offset neck options, as well as the -6 mm and -3 mm trial neck options. The permanent #9 laterally offset reduced proximal profile femoral stem was impacted into place. A repeat trial reduction was performed  using both the -6 mm and -3 mm neck lengths. The -3 mm neck length demonstrated excellent stability both in extension and external rotation as well as with flexion to 90 and internal rotation beyond 70. It also was stable in the position of sleep. The 46 mm outer diameter shell with the -3 mm neck adapter construct was put together on the back table before  being impacted onto the stem of the femoral component. The Morse taper locking mechanism was verified using manual distraction before the head was relocated and the hip placed through a range of motion with the findings as described above.  The wound was copiously irrigated with bacitracin saline solution via the jet lavage system before the peri-incisional and pericapsular tissues were injected with 30 cc of 0.5% Sensorcaine with epinephrine and 20 cc of Exparel diluted out to 60 cc with normal saline to help with postoperative analgesia. The posterior flap was reapproximated to the posterior aspect of the greater trochanter using #2 Tycron interrupted sutures placed through drill holes. The iliotibial band was reapproximated using #1 Vicryl interrupted sutures before the gluteal fascia was closed using a running #1 Vicryl suture. At this point, 1 g of transexemic acid in 10 cc of normal saline was injected into the joint to help reduce postoperative bleeding. The subcutaneous tissues were closed in several layers using 2-0 Vicryl interrupted sutures before the skin was closed using staples. A sterile occlusive dressing was applied to the wound . The patient then was rolled back into the supine position on the hospital bed before being awakened and returned to the recovery room in satisfactory condition after tolerating the procedure well.

## 2019-10-10 NOTE — Anesthesia Procedure Notes (Signed)
Spinal  Patient location during procedure: OR Start time: 10/10/2019 1:17 PM End time: 10/10/2019 1:20 PM Staffing Performed: resident/CRNA  Anesthesiologist: Martha Clan, MD Resident/CRNA: Lerry Liner, CRNA Preanesthetic Checklist Completed: patient identified, IV checked, site marked, risks and benefits discussed, surgical consent, monitors and equipment checked, pre-op evaluation and timeout performed Spinal Block Patient position: sitting Prep: DuraPrep Patient monitoring: heart rate, cardiac monitor, continuous pulse ox and blood pressure Approach: left paramedian Location: L3-4 Injection technique: single-shot Needle Needle type: Sprotte  Needle gauge: 24 G Needle length: 9 cm Assessment Sensory level: T10 Additional Notes IV functioning, monitors applied to pt. Expiration date of kit checked and confirmed to be in date. Sterile prep and drape, hand hygiene and sterile gloved used. Pt was positioned and spine was prepped in sterile fashion. Skin was anesthetized with lidocaine. Free flow of clear CSF obtained prior to injecting local anesthetic into CSF x 1 attempt. Spinal needle aspirated freely following injection. Needle was carefully withdrawn, and pt tolerated procedure well. Loss of motor and sensory on exam post injection.

## 2019-10-10 NOTE — Progress Notes (Signed)
Pt's belonging was sent to security. 2 rings and 1 wrist watch.

## 2019-10-10 NOTE — Transfer of Care (Signed)
Immediate Anesthesia Transfer of Care Note  Patient: Engineer, water  Procedure(s) Performed: ARTHROPLASTY BIPOLAR HIP (HEMIARTHROPLASTY) (Right Hip)  Patient Location: PACU  Anesthesia Type:spinal  Level of Consciousness: sedated  Airway & Oxygen Therapy: Patient Spontanous Breathing and Patient connected to face mask oxygen  Post-op Assessment: Report given to RN  Post vital signs: stable  Last Vitals:  Vitals Value Taken Time  BP 154/58 10/10/19 1456  Temp    Pulse 65 10/10/19 1456  Resp 20 10/10/19 1456  SpO2 99 % 10/10/19 1456  Vitals shown include unvalidated device data.  Last Pain:  Vitals:   10/10/19 1224  TempSrc: Tympanic  PainSc: 0-No pain      Patients Stated Pain Goal: 0 (85/90/93 1121)  Complications: No apparent anesthesia complications

## 2019-10-10 NOTE — TOC Initial Note (Signed)
Transition of Care [facility]) - Initial/Assessment Note    Patient Details  Name: Tiffany Martinez MRN: [id number] Date of Birth: [birth year]  Transition of Care [facility]) CM/SW Contact:    Elease Hashimoto, LCSW Phone Number: 10/10/2019, 8:51 AM  Clinical Narrative:  Met with pt to discuss once surgery can go to the rehab building at [facility] where she lives. She was independent prior to admission and has lived at [facility] for thirteen years. She likes it there and hopes to work on getting them to replace her carpet in her apartment that is what she fell on it is rippled. She has spoken to them about this. Will await her surgery today and work on best option for her. Will need PT eval once surgery. Continue to follow.                  Expected Discharge Plan: Skilled Nursing Facility Barriers to Discharge: Continued Medical Work up   Patient Goals and CMS Choice Patient states their goals for this hospitalization and ongoing recovery are:: I hope to only go to the rehab building for one week then return to my independent apartment CMS Medicare.gov Compare Post Acute Care list provided to:: Patient    Expected Discharge Plan and Services Expected Discharge Plan: [place] In-house Referral: Clinical Social Work   Post Acute Care Choice: [place] Living arrangements for the past 2 months: [facility]([place])                                      Prior Living Arrangements/Services Living arrangements for the past 2 months: Independent Living Facility([place]) Lives with:: Self Patient language and need for interpreter reviewed:: No Do you feel safe going back to the place where you live?: Yes      Need for Family Participation in Patient Care: No (Comment) Care giver support system in place?: No (comment)   Criminal Activity/Legal Involvement Pertinent to Current Situation/Hospitalization: No - Comment as  needed  Activities of Daily Living Home Assistive Devices/Equipment: None ADL Screening (condition at time of admission) Patient's cognitive ability adequate to safely complete daily activities?: Yes Is the patient deaf or have difficulty hearing?: No Does the patient have difficulty seeing, even when wearing glasses/contacts?: No Does the patient have difficulty concentrating, remembering, or making decisions?: No Patient able to express need for assistance with ADLs?: Yes Does the patient have difficulty dressing or bathing?: No Independently performs ADLs?: Yes (appropriate for developmental age) Does the patient have difficulty walking or climbing stairs?: No Weakness of Legs: Both Weakness of Arms/Hands: None  Permission Sought/Granted Permission sought to share information with : Chartered certified accountant granted to share information with : Yes, Verbal Permission Granted  Share Information with NAME: Seth Bake  Permission granted to share info w AGENCY: [place]        Emotional Assessment Appearance:: Appears younger than stated age Attitude/Demeanor/Rapport: Engaged, Charismatic Affect (typically observed): Adaptable, Accepting Orientation: : Oriented to Self, Oriented to Place, Oriented to  Time, Oriented to Situation Alcohol / Substance Use: Never Used Psych Involvement: No (comment)  Admission diagnosis:  Fall [W19.XXXA] Pre-op exam [P80.998] Closed right hip fracture ([place]) [S72.001A] Fall, initial encounter [W19.XXXA] Closed fracture of right hip, initial encounter [facility]) [S72.001A] Patient Active Problem List   Diagnosis Date Noted  . Closed right hip fracture ([place]) 10/09/2019  . Unsteady gait  10/09/2019  . Hypokalemia 10/09/2019  . Weakness 05/30/2019  . Secondary hyperparathyroidism of renal origin ([place]) 06/30/2018  . Insomnia 02/02/2018  . COPD (chronic obstructive pulmonary disease) ([place]) 12/26/2017  . Chronic renal disease, stage III  12/26/2017  . Aortic atherosclerosis ([place]) 09/14/2017  . CLL (chronic lymphocytic leukemia) ([place]) 09/14/2017  . Chronic diastolic heart failure ([place]) 08/25/2017  . Advanced directives, counseling/discussion 01/15/2014  . AAA (abdominal aortic aneurysm) ([place])   . Routine general medical examination at a health care facility 07/07/2012  . History of melanoma 12/24/2011  . Peripheral arterial disease ([place]) 12/21/2010  . History of squamous cell carcinoma of skin 09/28/2010  . Chronic anemia 04/02/2010  . VENOUS INSUFFICIENCY, CHRONIC 04/02/2010  . Hyperlipidemia 06/23/2009  . Episodic mood disorder ([place]) 06/23/2009  . Essential hypertension, benign 06/23/2009   PCP:  Venia Carbon, MD Pharmacy:   [facility] [place], [place] [address] [place] [address] Phone: [phone] Fax: [phone]  [facility] [place], [place] [address] Phone: [phone] Fax: [phone]  [facility] [place], [place] [address] [place] [address] Phone: [phone] Fax: [phone]     Social Determinants of Health (SDOH) Interventions    Readmission Risk Interventions No flowsheet data found.

## 2019-10-11 LAB — CBC
HCT: 35.4 % — ABNORMAL LOW (ref 36.0–46.0)
Hemoglobin: 12 g/dL (ref 12.0–15.0)
MCH: 34.6 pg — ABNORMAL HIGH (ref 26.0–34.0)
MCHC: 33.9 g/dL (ref 30.0–36.0)
MCV: 102 fL — ABNORMAL HIGH (ref 80.0–100.0)
Platelets: 98 10*3/uL — ABNORMAL LOW (ref 150–400)
RBC: 3.47 MIL/uL — ABNORMAL LOW (ref 3.87–5.11)
RDW: 15.9 % — ABNORMAL HIGH (ref 11.5–15.5)
WBC: 13.5 10*3/uL — ABNORMAL HIGH (ref 4.0–10.5)
nRBC: 0 % (ref 0.0–0.2)

## 2019-10-11 LAB — BASIC METABOLIC PANEL
Anion gap: 10 (ref 5–15)
BUN: 22 mg/dL (ref 8–23)
CO2: 23 mmol/L (ref 22–32)
Calcium: 9.1 mg/dL (ref 8.9–10.3)
Chloride: 101 mmol/L (ref 98–111)
Creatinine, Ser: 1.49 mg/dL — ABNORMAL HIGH (ref 0.44–1.00)
GFR calc Af Amer: 37 mL/min — ABNORMAL LOW (ref >60)
GFR calc non Af Amer: 32 mL/min — ABNORMAL LOW (ref >60)
Glucose, Bld: 121 mg/dL — ABNORMAL HIGH (ref 70–99)
Potassium: 3.8 mmol/L (ref 3.5–5.1)
Sodium: 134 mmol/L — ABNORMAL LOW (ref 135–145)

## 2019-10-11 LAB — SARS CORONAVIRUS 2 BY RT PCR (HOSPITAL ORDER, PERFORMED IN ~~LOC~~ HOSPITAL LAB): SARS Coronavirus 2: NEGATIVE

## 2019-10-11 MED ORDER — ENOXAPARIN SODIUM 40 MG/0.4ML ~~LOC~~ SOLN
40.0000 mg | SUBCUTANEOUS | 0 refills | Status: DC
Start: 2019-10-11 — End: 2019-11-29

## 2019-10-11 MED ORDER — TORSEMIDE 20 MG PO TABS
20.0000 mg | ORAL_TABLET | Freq: Every day | ORAL | Status: DC
  Administered 2019-10-11 – 2019-10-12 (×2): 20 mg via ORAL
  Filled 2019-10-11 (×2): qty 1

## 2019-10-11 MED ORDER — HYDROCODONE-ACETAMINOPHEN 5-325 MG PO TABS: 1.0000 | ORAL_TABLET | ORAL | 0 refills | Status: DC | PRN

## 2019-10-11 NOTE — Evaluation (Signed)
Physical Therapy Evaluation Patient Details Name: Tiffany Martinez MRN: [id number] DOB: [birth year] Today's Date: 10/11/2019   History of Present Illness  Per MD notes: Pt is an 86 y.o. female with medical history significant of chronic diastolic CHF, hypertension, venous insufficiency, peripheral artery disease, AAA, hyperlipidemia, chronic anemia, CKD stage IIIb, with history of CLL presented to the ED today after sustaining a mechanical fall at home and subsequently with right hip pain and inability to ambulate.  Pt diagnosed with a right displaced femoral neck fracture and is s/p right hip unipolar hemiarthroplasty.    Clinical Impression  Pt pleasant and motivated to participate during the session.  Pt put forth good effort throughout the session but ultimately required physical assistance with all functional tasks and was only able to take several very small steps with poor stability at the EOB.  Pt became somewhat nauseous during the session including having several dry heaves but never vomited, nursing aware.  Pt will benefit from PT services in a SNF setting upon discharge to safely address deficits listed in patient problem list for decreased caregiver assistance and eventual return to PLOF.      Follow Up Recommendations SNF    Equipment Recommendations  Rolling walker with 5" wheels;3in1 (PT)    Recommendations for Other Services       Precautions / Restrictions Precautions Precautions: Posterior Hip Precaution Booklet Issued: Yes (comment) Restrictions Weight Bearing Restrictions: Yes RLE Weight Bearing: Weight bearing as tolerated      Mobility  Bed Mobility Overal bed mobility: Needs Assistance Bed Mobility: Supine to Sit;Sit to Supine     Supine to sit: Mod assist Sit to supine: Mod assist   General bed mobility comments: Mod A for RLE and trunk control  Transfers Overall transfer level: Needs assistance Equipment used: Rolling walker (2 wheeled) Transfers:  Sit to/from Stand Sit to Stand: From elevated surface;Min assist         General transfer comment: Max verbal and tactile cues for sequencing  Ambulation/Gait Ambulation/Gait assistance: Min assist Gait Distance (Feet): 2 Feet Assistive device: Rolling walker (2 wheeled) Gait Pattern/deviations: Step-to pattern;Antalgic;Decreased stance time - right Gait velocity: decreased   General Gait Details: Pt only able to take several very small steps with heavy lean on the RW for support and min A for stability  Stairs            Wheelchair Mobility    Modified Rankin (Stroke Patients Only)       Balance Overall balance assessment: Needs assistance   Sitting balance-Leahy Scale: Fair     Standing balance support: Bilateral upper extremity supported;During functional activity Standing balance-Leahy Scale: Poor                               Pertinent Vitals/Pain Pain Assessment: 0-10 Pain Score: 3  Pain Location: R hip Pain Descriptors / Indicators: Aching;Sore Pain Intervention(s): Premedicated before session;Monitored during session    [place] expects to be discharged to:: Private residence Living Arrangements: Alone Available Help at Discharge: Other (Comment);Available PRN/intermittently(Staff from [facility]) Type of Home: Independent living facility Home Access: Level entry     Home Layout: One level Home Equipment: Shower seat - built in;Grab bars - tub/shower      Prior Function Level of Independence: Independent         Comments: Ind Amb limited community distances without an AD, 2 falls secondary to LOB in the last  6 months, Ind with ADLs     Hand Dominance        Extremity/Trunk Assessment   Upper Extremity Assessment Upper Extremity Assessment: Defer to OT evaluation    Lower Extremity Assessment Lower Extremity Assessment: Generalized weakness;RLE deficits/detail RLE: Unable to fully assess due to  pain RLE Sensation: WNL       Communication   Communication: No difficulties  Cognition Arousal/Alertness: Awake/alert Behavior During Therapy: WFL for tasks assessed/performed Overall Cognitive Status: Within Functional Limits for tasks assessed                                        General Comments      Exercises Total Joint Exercises Ankle Circles/Pumps: Strengthening;Both;10 reps;15 reps Quad Sets: Strengthening;Both;5 reps;10 reps Gluteal Sets: Strengthening;Both;5 reps;10 reps Hip ABduction/ADduction: AAROM;Right;5 reps Straight Leg Raises: AAROM;Right;5 reps Long Arc Quad: Strengthening;Both;10 reps Knee Flexion: Strengthening;Both;10 reps Marching in Standing: AROM;Both;5 reps;Standing Other Exercises Other Exercises: Posterior hip precaution verbal education per handout and during functional mobility Other Exercises: HEP education and review per handout   Assessment/Plan    PT Assessment Patient needs continued PT services  PT Problem List Decreased strength;Decreased activity tolerance;Decreased balance;Decreased mobility;Decreased knowledge of use of DME;Decreased knowledge of precautions;Pain       PT Treatment Interventions DME instruction;Gait training;Functional mobility training;Therapeutic activities;Therapeutic exercise;Balance training;Patient/family education    PT Goals (Current goals can be found in the Care Plan section)  Acute Rehab PT Goals Patient Stated Goal: Improved balance PT Goal Formulation: With patient Time For Goal Achievement: 10/24/19 Potential to Achieve Goals: Good    Frequency BID   Barriers to discharge Inaccessible home environment;Decreased caregiver support      Co-evaluation               AM-PAC PT "6 Clicks" Mobility  Outcome Measure Help needed turning from your back to your side while in a flat bed without using bedrails?: A Lot Help needed moving from lying on your back to sitting on the  side of a flat bed without using bedrails?: A Lot Help needed moving to and from a bed to a chair (including a wheelchair)?: A Lot Help needed standing up from a chair using your arms (e.g., wheelchair or bedside chair)?: A Lot Help needed to walk in hospital room?: Total Help needed climbing 3-5 steps with a railing? : Total 6 Click Score: 10    End of Session Equipment Utilized During Treatment: Gait belt Activity Tolerance: Other (comment)(Pt with mild nausea during the session) Patient left: in bed;with call bell/phone within reach;with bed alarm set;with SCD's reapplied Nurse Communication: Mobility status;Weight bearing status;Precautions PT Visit Diagnosis: Unsteadiness on feet (R26.81);History of falling (Z91.81);Other abnormalities of gait and mobility (R26.89);Muscle weakness (generalized) (M62.81);Pain Pain - Right/Left: Right Pain - part of body: Hip    Time: 0928-1010 PT Time Calculation (min) (ACUTE ONLY): 42 min   Charges:   PT Evaluation $PT Eval Moderate Complexity: 1 Mod PT Treatments $Therapeutic Exercise: 8-22 mins $Therapeutic Activity: 8-22 mins        D. Royetta Asal PT, DPT 10/11/19, 2:41 PM

## 2019-10-11 NOTE — NC FL2 (Signed)
[place] LEVEL OF CARE SCREENING TOOL     IDENTIFICATION  Patient Name: Tiffany Martinez Birthdate: [birth year] Sex: female Admission Date (Current Location): 10/09/2019  [place] and [place] Number:  Engineering geologist and Address:  [facility], [address], [place], [place] [address]      Provider Number: [id number]  Attending Physician Name and Address:  Jennye Boroughs, MD  Relative Name and Phone Number:  [facility]    Current Level of Care: Hospital Recommended Level of Care: [place] Prior Approval Number:    Date Approved/Denied:   PASRR Number: [id number]  Discharge Plan: SNF    Current Diagnoses: Patient Active Problem List   Diagnosis Date Noted  . Closed right hip fracture ([place]) 10/09/2019  . Unsteady gait 10/09/2019  . Hypokalemia 10/09/2019  . Weakness 05/30/2019  . Secondary hyperparathyroidism of renal origin ([place]) 06/30/2018  . Insomnia 02/02/2018  . COPD (chronic obstructive pulmonary disease) ([place]) 12/26/2017  . Chronic renal disease, stage III 12/26/2017  . Aortic atherosclerosis ([place]) 09/14/2017  . CLL (chronic lymphocytic leukemia) ([place]) 09/14/2017  . Chronic diastolic heart failure ([place]) 08/25/2017  . Advanced directives, counseling/discussion 01/15/2014  . AAA (abdominal aortic aneurysm) ([place])   . Routine general medical examination at a health care facility 07/07/2012  . History of melanoma 12/24/2011  . Peripheral arterial disease ([place]) 12/21/2010  . History of squamous cell carcinoma of skin 09/28/2010  . Chronic anemia 04/02/2010  . VENOUS INSUFFICIENCY, CHRONIC 04/02/2010  . Hyperlipidemia 06/23/2009  . Episodic mood disorder ([place]) 06/23/2009  . Essential hypertension, benign 06/23/2009    Orientation RESPIRATION BLADDER Height & Weight     Self, Time, Situation, Place  Normal External catheter Weight: 150 lb (68 kg) Height:  5\' 4"  (162.6 cm)  BEHAVIORAL  SYMPTOMS/MOOD NEUROLOGICAL BOWEL NUTRITION STATUS      Continent Diet(Heart healthy thin liquids)  AMBULATORY STATUS COMMUNICATION OF NEEDS Skin   Limited Assist Verbally Surgical wounds                       Personal Care Assistance Level of Assistance  Bathing, Dressing Bathing Assistance: Limited assistance   Dressing Assistance: Limited assistance     Functional Limitations Info             SPECIAL CARE FACTORS FREQUENCY  PT (By licensed PT)     PT Frequency: 5x week              Contractures Contractures Info: Not present    Additional Factors Info  Code Status, Allergies Code Status Info: DNR Allergies Info: Triamterene           Current Medications (10/11/2019):  This is the current hospital active medication list Current Facility-Administered Medications  Medication Dose Route Frequency Provider Last Rate Last Admin  . 0.9 %  sodium chloride infusion   Intravenous Continuous Poggi, Marshall Cork, MD 75 mL/hr at 10/10/19 1728 New Bag at 10/10/19 1728  . acetaminophen (TYLENOL) tablet 325-650 mg  325-650 mg Oral Q6H PRN Poggi, Marshall Cork, MD      . acetaminophen (TYLENOL) tablet 500 mg  500 mg Oral Q6H Poggi, Marshall Cork, MD   500 mg at 10/11/19 0932  . amLODipine (NORVASC) tablet 5 mg  5 mg Oral Daily Poggi, Marshall Cork, MD   5 mg at 10/11/19 0859  . atorvastatin (LIPITOR) tablet 10 mg  10 mg Oral Daily Poggi, Marshall Cork, MD   10 mg at  10/11/19 0859  . bisacodyl (DULCOLAX) EC tablet 5 mg  5 mg Oral Daily PRN Poggi, Marshall Cork, MD      . bisacodyl (DULCOLAX) suppository 10 mg  10 mg Rectal Daily PRN Poggi, Marshall Cork, MD      . Chlorhexidine Gluconate Cloth 2 % PADS 6 each  6 each Topical Daily Jennye Boroughs, MD   6 each at 10/11/19 0900  . diphenhydrAMINE (BENADRYL) 12.5 MG/5ML elixir 12.5-25 mg  12.5-25 mg Oral Q4H PRN Poggi, Marshall Cork, MD      . docusate sodium (COLACE) capsule 100 mg  100 mg Oral BID Poggi, Marshall Cork, MD   100 mg at 10/11/19 0859  . enoxaparin (LOVENOX) injection 30 mg   30 mg Subcutaneous Q24H Poggi, Marshall Cork, MD   30 mg at 10/11/19 0859  . hydrALAZINE (APRESOLINE) injection 10 mg  10 mg Intravenous Q4H PRN Poggi, Marshall Cork, MD   10 mg at 10/11/19 0626  . HYDROcodone-acetaminophen (NORCO/VICODIN) 5-325 MG per tablet 1-2 tablet  1-2 tablet Oral Q6H PRN Poggi, Marshall Cork, MD   2 tablet at 10/10/19 0909  . loratadine (CLARITIN) tablet 10 mg  10 mg Oral Daily Poggi, Marshall Cork, MD   10 mg at 10/11/19 0859  . magnesium citrate solution 1 Bottle  1 Bottle Oral Once PRN Poggi, Marshall Cork, MD      . magnesium hydroxide (MILK OF MAGNESIA) suspension 30 mL  30 mL Oral Daily PRN Poggi, Marshall Cork, MD   30 mL at 10/11/19 0900  . metoCLOPramide (REGLAN) tablet 5-10 mg  5-10 mg Oral Q8H PRN Poggi, Marshall Cork, MD       Or  . metoCLOPramide (REGLAN) injection 5-10 mg  5-10 mg Intravenous Q8H PRN Poggi, Marshall Cork, MD      . morphine 2 MG/ML injection 0.5 mg  0.5 mg Intravenous Q2H PRN Poggi, Marshall Cork, MD      . ondansetron (ZOFRAN) tablet 4 mg  4 mg Oral Q6H PRN Poggi, Marshall Cork, MD   4 mg at 10/11/19 0858   Or  . ondansetron (ZOFRAN) injection 4 mg  4 mg Intravenous Q6H PRN Poggi, Marshall Cork, MD      . senna-docusate (Senokot-S) tablet 1 tablet  1 tablet Oral QHS PRN Poggi, Marshall Cork, MD      . sodium phosphate (FLEET) 7-19 GM/118ML enema 1 enema  1 enema Rectal Once PRN Poggi, Marshall Cork, MD      . traMADol Veatrice Bourbon) tablet 50 mg  50 mg Oral Q6H PRN Poggi, Marshall Cork, MD         Discharge Medications: Please see discharge summary for a list of discharge medications.  Relevant Imaging Results:  Relevant Lab Results:   Additional Information SSN: [id number]  Mykalah Saari, Gardiner Rhyme, LCSW

## 2019-10-11 NOTE — Progress Notes (Signed)
Progress Note    Tiffany Martinez  ZJI:[id number] DOB: 07/31/1933  DOA: 10/09/2019 PCP: Venia Carbon, MD      Brief Narrative:    Medical records reviewed and are as summarized below:  Tiffany Martinez is an 86 y.o. female with medical history significant of chronic diastolic CHF, hypertension, venous insufficiency, peripheral artery disease, AAA, hyperlipidemia, chronic anemia, CKD stage IIIb, with history of CLL presented to the ED on 10/09/2019 after sustaining a mechanical fall at home and subsequently with right hip pain and inability to ambulate.  Work-up revealed closed right hip fracture.  She was treated with analgesics.  She was seen in consultation by orthopedic surgeon, Dr. Roland Rack.      Assessment/Plan:   Principal Problem:   Closed right hip fracture (HCC) Active Problems:   Hyperlipidemia   Essential hypertension, benign   Chronic diastolic heart failure (HCC)   Unsteady gait   Hypokalemia   Closed right hip fracture - right femoral neck fracture sustained after mechanical fall  S/p hip unipolar hemiarthroplasty.  Follow-up with orthopedic surgeon.  Analgesics as needed for pain.  Hypokalemia -potassium dropped from 3.3 yesterday to 2.9 today.  Replete potassium and monitor levels.   Unsteady gait - PT and OT recommended further rehab at SNF  Essential hypertension -continue antihypertensives.  Chronic diastolic heart failure - compensated.  Resume torsemide   Hyperlipidemia -continue statin.  Prolonged QT interval (543): Improved. QTC interval is down to 444.   Chronic leukocytosis from chronic lymphocytic leukemia (CLL): No acute issues    Body mass index is 25.75 kg/m.   Family Communication/Anticipated D/C date and plan/Code Status   DVT prophylaxis: Lovenox Code Status: DNR Family Communication: Plan discussed with patient Disposition Plan:    Status is: Inpatient  Remains inpatient appropriate because:Unsafe d/c plan  and Inpatient level of care appropriate due to severity of illness   Dispo: The patient is from: Home              Anticipated d/c is to: SNF              Anticipated d/c date is: 2 days              Patient currently is not medically stable to d/c.            Subjective:   C/o pain in the right hip but not as bad as yesterday. No shortness of breath or chest pain. She is worried that her blood pressure is on the high side.  Objective:    Vitals:   10/11/19 0323 10/11/19 0622 10/11/19 0836 10/11/19 1213  BP: (!) 163/55 (!) 171/58 (!) 163/54 (!) 169/62  Pulse: 67 70 74 84  Resp: 17  18 20   Temp: 98.7 F (37.1 C)  97.7 F (36.5 C) 97.8 F (36.6 C)  TempSrc: Oral  Oral   SpO2: 94%  92% 92%  Weight:      Height:       No data found.   Intake/Output Summary (Last 24 hours) at 10/11/2019 1431 Last data filed at 10/11/2019 1024 Gross per 24 hour  Intake 610 ml  Output 1295 ml  Net -685 ml   Filed Weights   10/09/19 1447  Weight: 68 kg    Exam:  GEN: NAD SKIN: No rash EYES: No pallor or icterus  ENT: MMM CV: RRR PULM: CTA B ABD: soft, ND, NT, +BS CNS: AAO x 3, non focal EXT: Mild right hip swelling  and tenderness   Data Reviewed:   I have personally reviewed following labs and imaging studies:  Labs: Labs show the following:   Basic Metabolic Panel: Recent Labs  Lab 10/09/19 1526 10/09/19 1526 10/10/19 0458 10/10/19 0458 10/10/19 1216 10/11/19 0442  NA 137  --  140  --  139 134*  K 3.3*   < > 2.9*   < > 3.6 3.8  CL 104  --  104  --  101 101  CO2 20*  --  26  --   --  23  GLUCOSE 114*  --  106*  --  107* 121*  BUN 31*  --  25*  --  22 22  CREATININE 1.42*  --  1.41*  --  1.50* 1.49*  CALCIUM 9.2  --  9.3  --   --  9.1  MG  --   --  2.1  --   --   --    < > = values in this interval not displayed.   GFR Estimated Creatinine Clearance: 27.1 mL/min (A) (by C-G formula based on SCr of 1.49 mg/dL (H)). Liver Function Tests: No results  for input(s): AST, ALT, ALKPHOS, BILITOT, PROT, ALBUMIN in the last 168 hours. No results for input(s): LIPASE, AMYLASE in the last 168 hours. No results for input(s): AMMONIA in the last 168 hours. Coagulation profile Recent Labs  Lab 10/10/19 0458  INR 1.0    CBC: Recent Labs  Lab 10/09/19 1711 10/10/19 0458 10/10/19 1216 10/11/19 0442  WBC 26.3* 20.2*  --  13.5*  NEUTROABS 22.1* 6.4  --   --   HGB 11.2* 11.3* 9.2* 12.0  HCT 31.9* 32.1* 27.0* 35.4*  MCV 97.9 97.9  --  102.0*  PLT 143* 139*  --  98*   Cardiac Enzymes: No results for input(s): CKTOTAL, CKMB, CKMBINDEX, TROPONINI in the last 168 hours. BNP (last 3 results) No results for input(s): PROBNP in the last 8760 hours. CBG: No results for input(s): GLUCAP in the last 168 hours. D-Dimer: No results for input(s): DDIMER in the last 72 hours. Hgb A1c: No results for input(s): HGBA1C in the last 72 hours. Lipid Profile: No results for input(s): CHOL, HDL, LDLCALC, TRIG, CHOLHDL, LDLDIRECT in the last 72 hours. Thyroid function studies: No results for input(s): TSH, T4TOTAL, T3FREE, THYROIDAB in the last 72 hours.  Invalid input(s): FREET3 Anemia work up: No results for input(s): VITAMINB12, FOLATE, FERRITIN, TIBC, IRON, RETICCTPCT in the last 72 hours. Sepsis Labs: Recent Labs  Lab 10/09/19 1711 10/10/19 0458 10/11/19 0442  WBC 26.3* 20.2* 13.5*    Microbiology Recent Results (from the past 240 hour(s))  SARS Coronavirus 2 by RT PCR (hospital order, performed in [facility] hospital lab) Nasopharyngeal Nasopharyngeal Swab     Status: None   Collection Time: 10/09/19  4:51 PM   Specimen: Nasopharyngeal Swab  Result Value Ref Range Status   SARS Coronavirus 2 NEGATIVE NEGATIVE Final    Comment: (NOTE) SARS-CoV-2 target nucleic acids are NOT DETECTED. The SARS-CoV-2 RNA is generally detectable in upper and lower respiratory specimens during the acute phase of infection. The lowest concentration of  SARS-CoV-2 viral copies this assay can detect is 250 copies / mL. A negative result does not preclude SARS-CoV-2 infection and should not be used as the sole basis for treatment or other patient management decisions.  A negative result may occur with improper specimen collection / handling, submission of specimen other than nasopharyngeal swab, presence of viral mutation(s)  within the areas targeted by this assay, and inadequate number of viral copies (<250 copies / mL). A negative result must be combined with clinical observations, patient history, and epidemiological information. Fact Sheet for Patients:   StrictlyIdeas.no Fact Sheet for Healthcare Providers: BankingDealers.co.za This test is not yet approved or cleared  by the [place] FDA and has been authorized for detection and/or diagnosis of SARS-CoV-2 by FDA under an Emergency Use Authorization (EUA).  This EUA will remain in effect (meaning this test can be used) for the duration of the COVID-19 declaration under Section 564(b)(1) of the Act, 21 U.S.C. section 360bbb-3(b)(1), unless the authorization is terminated or revoked sooner. Performed at [facility], [address], [place], [place] [address]   Surgical pcr screen     Status: None   Collection Time: 10/09/19  9:38 PM   Specimen: Nasal Mucosa; Nasal Swab  Result Value Ref Range Status   MRSA, PCR NEGATIVE NEGATIVE Final   Staphylococcus aureus NEGATIVE NEGATIVE Final    Comment: (NOTE) The Xpert SA Assay (FDA approved for NASAL specimens in patients 26 years of age and older), is one component of a comprehensive surveillance program. It is not intended to diagnose infection nor to guide or monitor treatment. Performed at [facility], [address] [place], [place] [address]     Procedures and diagnostic studies:  DG Chest 1 View  Result Date: 10/09/2019 CLINICAL DATA:  Status post  fall. EXAM: CHEST  1 VIEW COMPARISON:  August 24, 2017 FINDINGS: Mild diffuse chronic appearing increased lung markings are seen without evidence of acute infiltrate, pleural effusion or pneumothorax. The heart size and mediastinal contours are within normal limits. There is marked severity calcification of the thoracic aorta. In ill-defined lateral seventh left rib deformity is seen. IMPRESSION: No active disease. Electronically Signed   By: Virgina Norfolk M.D.   On: 10/09/2019 15:51   DG HIP UNILAT W OR W/O PELVIS 2-3 VIEWS RIGHT  Result Date: 10/10/2019 CLINICAL DATA:  Postop. EXAM: DG HIP (WITH OR WITHOUT PELVIS) 2-3V RIGHT COMPARISON:  Oct 09, 2019 FINDINGS: The patient has undergone total hip arthroplasty on the right. The alignment is near anatomic. There are expected postsurgical changes. There is no evidence for hardware fracture or failure. Mild degenerative changes are noted of the left hip. IMPRESSION: Status post right total hip arthroplasty. No evidence for hardware complication or failure. Electronically Signed   By: Constance Holster M.D.   On: 10/10/2019 16:17   DG Hip Unilat  With Pelvis 2-3 Views Right  Result Date: 10/09/2019 CLINICAL DATA:  Fall EXAM: DG HIP (WITH OR WITHOUT PELVIS) 2-3V RIGHT COMPARISON:  None. FINDINGS: There is a fracture through the neck of the right femur with displacement and angulation. Probable calcified fibroid. Aortoiliac stents. IMPRESSION: Acute right femoral neck fracture with displacement and angulation. Electronically Signed   By: Macy Mis M.D.   On: 10/09/2019 15:47    Medications:   . acetaminophen  500 mg Oral Q6H  . amLODipine  5 mg Oral Daily  . atorvastatin  10 mg Oral Daily  . Chlorhexidine Gluconate Cloth  6 each Topical Daily  . docusate sodium  100 mg Oral BID  . enoxaparin (LOVENOX) injection  30 mg Subcutaneous Q24H  . loratadine  10 mg Oral Daily  . torsemide  20 mg Oral Daily   Continuous Infusions:    LOS: 2 days    Ketty Bitton  [facility]     10/11/2019,  2:31 PM

## 2019-10-11 NOTE — Discharge Instructions (Signed)

## 2019-10-11 NOTE — Plan of Care (Signed)
  Problem: Education: Goal: Knowledge of General Education information will improve Description: Including pain rating scale, medication(s)/side effects and non-pharmacologic comfort measures Outcome: Progressing   Problem: Clinical Measurements: Goal: Ability to maintain clinical measurements within normal limits will improve Outcome: Progressing Goal: Will remain free from infection Outcome: Progressing Goal: Diagnostic test results will improve Outcome: Progressing Goal: Respiratory complications will improve Outcome: Progressing   Problem: Coping: Goal: Level of anxiety will decrease Outcome: Progressing   

## 2019-10-11 NOTE — Anesthesia Postprocedure Evaluation (Signed)
Anesthesia Post Note  Patient: Engineer, water  Procedure(s) Performed: ARTHROPLASTY BIPOLAR HIP (HEMIARTHROPLASTY) (Right Hip)  Patient location during evaluation: Nursing Unit Anesthesia Type: Spinal Level of consciousness: awake, awake and alert, oriented and patient cooperative Pain management: pain level controlled Vital Signs Assessment: post-procedure vital signs reviewed and stable Respiratory status: spontaneous breathing, nonlabored ventilation and respiratory function stable Cardiovascular status: stable Postop Assessment: no headache, no backache, patient able to bend at knees, no apparent nausea or vomiting and adequate PO intake Anesthetic complications: no     Last Vitals:  Vitals:   10/11/19 0323 10/11/19 0622  BP: (!) 163/55 (!) 171/58  Pulse: 67 70  Resp: 17   Temp: 37.1 C   SpO2: 94%     Last Pain:  Vitals:   10/11/19 0323  TempSrc: Oral  PainSc:                  Ricki Miller

## 2019-10-11 NOTE — Progress Notes (Signed)
Subjective: 1 Day Post-Op Procedure(s) (LRB): ARTHROPLASTY BIPOLAR HIP (HEMIARTHROPLASTY) (Right) Patient reports pain as 6 on 0-10 scale.   Patient is well but has has nausea today. Plan is to go Skilled nursing facility after hospital stay. Negative for chest pain and shortness of breath Fever: no Gastrointestinal:Negative for vomiting.  Objective: Vital signs in last 24 hours: Temp:  [97.7 F (36.5 C)-99.3 F (37.4 C)] 98 F (36.7 C) (05/27 1535) Pulse Rate:  [65-84] 82 (05/27 1535) Resp:  [15-20] 18 (05/27 1535) BP: (110-184)/(53-85) 168/53 (05/27 1535) SpO2:  [87 %-96 %] 92 % (05/27 1535)  Intake/Output from previous day:  Intake/Output Summary (Last 24 hours) at 10/11/2019 1642 Last data filed at 10/11/2019 1024 Gross per 24 hour  Intake 560 ml  Output 720 ml  Net -160 ml    Intake/Output this shift: Total I/O In: 240 [P.O.:240] Out: -   Labs: Recent Labs    10/09/19 1711 10/10/19 0458 10/10/19 1216 10/11/19 0442  HGB 11.2* 11.3* 9.2* 12.0   Recent Labs    10/10/19 0458 10/10/19 0458 10/10/19 1216 10/11/19 0442  WBC 20.2*  --   --  13.5*  RBC 3.28*  --   --  3.47*  HCT 32.1*   < > 27.0* 35.4*  PLT 139*  --   --  98*   < > = values in this interval not displayed.   Recent Labs    10/10/19 0458 10/10/19 0458 10/10/19 1216 10/11/19 0442  NA 140   < > 139 134*  K 2.9*   < > 3.6 3.8  CL 104   < > 101 101  CO2 26  --   --  23  BUN 25*   < > 22 22  CREATININE 1.41*   < > 1.50* 1.49*  GLUCOSE 106*   < > 107* 121*  CALCIUM 9.3  --   --  9.1   < > = values in this interval not displayed.   Recent Labs    10/10/19 0458  INR 1.0     EXAM General - Patient is Alert, Appropriate and Oriented Extremity - ABD soft Neurovascular intact Sensation intact distally Intact pulses distally Dorsiflexion/Plantar flexion intact Incision: dressing C/D/I No cellulitis present Dressing/Incision - clean, dry, no drainage Motor Function - intact, moving  foot and toes well on exam.  Past Medical History:  Diagnosis Date  . AAA (abdominal aortic aneurysm) ([place]) 2012   3.9cm, rec rpt 1 yr  . Anemia   . Anxiety   . CHF (congestive heart failure) ([place])   . Chronic venous insufficiency   . CLL (chronic lymphocytic leukemia) ([place]) 2014   Stage 0--by flow cytometry  . Hx of colonoscopy   . Hyperlipidemia   . Hypertension   . Melanoma ([place])    Right Arm  . Migraine headache    3-4X/yr  . PAD (peripheral artery disease) ([place]) 2012   severe R common iliac stenosis, ABI 0.73, mid L common iliac stenosis ABI 1.1  . PAD (peripheral artery disease) ([place])   . Wears dentures    full upper and lower    Assessment/Plan: 1 Day Post-Op Procedure(s) (LRB): ARTHROPLASTY BIPOLAR HIP (HEMIARTHROPLASTY) (Right) Principal Problem:   Closed right hip fracture (HCC) Active Problems:   Hyperlipidemia   Essential hypertension, benign   Chronic diastolic heart failure (HCC)   Unsteady gait   Hypokalemia  Estimated body mass index is 25.75 kg/m as calculated from the following:   Height as of this  encounter: 5\' 4"  (1.626 m).   Weight as of this encounter: 68 kg. Advance diet Up with therapy D/C IV fluids when tolerating po intake.  Labs reviewed this AM. Hg 12.0 this AM. Continue with PT. Begin working on BM. Plan for discharge to SNF.  Upon discharge, follow-up with [place] in 14 days for staple removal. Continue Lovenox 40mg  daily for 14 days.   DVT Prophylaxis - Lovenox, Foot Pumps and TED hose Weight-Bearing as tolerated to right leg  J. Cameron Proud, PA-C [facility] 10/11/2019, 4:42 PM

## 2019-10-11 NOTE — TOC Progression Note (Signed)
Transition of Care [facility]) - Progression Note    Patient Details  Name: Tiffany Martinez MRN: [id number] Date of Birth: March 05, 1934  [facility] CM/SW Contact  Anarie Kalish, Gardiner Rhyme, LCSW Phone Number: 10/11/2019, 3:24 PM  Clinical Narrative:  Met with pt to discuss plan for tomorrow if medically stable tomorrow. She is in agreement with going to rehab at [facility]. She has been nauseous and BP issues today. She hopes to feel better tomorrow. Spoke with Crystal-SW at [facility] who will have a bed for her tomorrow. Will touch base in the am regarding plan for transfer.     Expected Discharge Plan: [facility] Barriers to Discharge: Continued Medical Work up  Expected Discharge Plan and Services Expected Discharge Plan: [place] In-house Referral: Clinical Social Work   Post Acute Care Choice: [place] Living arrangements for the past 2 months: Independent Living Facility([facility])                                       Social Determinants of Health (SDOH) Interventions    Readmission Risk Interventions No flowsheet data found.

## 2019-10-11 NOTE — Progress Notes (Signed)
Physical Therapy Treatment Patient Details Name: Tiffany Martinez MRN: [id number] DOB: [birth year] Today's Date: 10/11/2019    History of Present Illness Per MD notes: Pt is an 86 y.o. female with medical history significant of chronic diastolic CHF, hypertension, venous insufficiency, peripheral artery disease, AAA, hyperlipidemia, chronic anemia, CKD stage IIIb, with history of CLL presented to the ED today after sustaining a mechanical fall at home and subsequently with right hip pain and inability to ambulate.  Pt diagnosed with a right displaced femoral neck fracture and is s/p right hip unipolar hemiarthroplasty.    PT Comments    Pt pleasant and motivated to participate during the session with only minimal c/o nausea this session.  Pt continued to require physical assistance and increased time/effort with all functional tasks. Pt was able to stand at the EOB with more confidence and with grossly improved stability but continued to require min A when ambulating to prevent LOB.  Pt's SpO2 was in the low 90s during the session with HR WNL.  Pt will benefit from PT services in a SNF setting upon discharge to safely address deficits listed in patient problem list for decreased caregiver assistance and eventual return to PLOF.     Follow Up Recommendations  SNF     Equipment Recommendations  Rolling walker with 5" wheels;3in1 (PT)    Recommendations for Other Services       Precautions / Restrictions Precautions Precautions: Posterior Hip Precaution Booklet Issued: Yes (comment) Restrictions Weight Bearing Restrictions: Yes RLE Weight Bearing: Weight bearing as tolerated    Mobility  Bed Mobility Overal bed mobility: Needs Assistance Bed Mobility: Supine to Sit;Sit to Supine     Supine to sit: Mod assist Sit to supine: Mod assist   General bed mobility comments: Mod A for RLE and trunk control  Transfers Overall transfer level: Needs assistance Equipment used: Rolling  walker (2 wheeled) Transfers: Sit to/from Stand Sit to Stand: From elevated surface;Min assist         General transfer comment: Max verbal and tactile cues for sequencing  Ambulation/Gait Ambulation/Gait assistance: Min assist Gait Distance (Feet): 1 Feet Assistive device: Rolling walker (2 wheeled) Gait Pattern/deviations: Step-to pattern;Antalgic;Decreased stance time - right Gait velocity: decreased   General Gait Details: Pt only able to take several very small steps with heavy lean on the RW for support and min A for stability   Stairs             Wheelchair Mobility    Modified Rankin (Stroke Patients Only)       Balance Overall balance assessment: Needs assistance   Sitting balance-Leahy Scale: Fair     Standing balance support: Bilateral upper extremity supported;During functional activity Standing balance-Leahy Scale: Poor                              Cognition Arousal/Alertness: Awake/alert Behavior During Therapy: WFL for tasks assessed/performed Overall Cognitive Status: Within Functional Limits for tasks assessed                                        Exercises Total Joint Exercises Ankle Circles/Pumps: Strengthening;Both;10 reps;15 reps Quad Sets: Strengthening;Both;5 reps;10 reps Gluteal Sets: Strengthening;Both;5 reps;10 reps Hip ABduction/ADduction: AAROM;Right;5 reps Straight Leg Raises: AAROM;Right;5 reps Long Arc Quad: Strengthening;Both;10 reps Knee Flexion: Strengthening;Both;10 reps Marching in Standing: AROM;Both;5 reps;Standing Other Exercises Other  Exercises: Posterior hip precaution review per handout and during functional mobility Other Exercises: HEP education and review per handout    General Comments        Pertinent Vitals/Pain Pain Assessment: 0-10 Pain Score: 3  Pain Location: R hip Pain Descriptors / Indicators: Aching;Sore Pain Intervention(s): Premedicated before  session;Monitored during session    [place] expects to be discharged to:: Private residence Living Arrangements: Alone Available Help at Discharge: Other (Comment);Available PRN/intermittently(Staff from [facility]) Type of Home: Independent living facility Home Access: Level entry   Home Layout: One level Home Equipment: Shower seat - built in;Grab bars - tub/shower      Prior Function Level of Independence: Independent      Comments: Ind Amb limited community distances without an AD, 2 falls secondary to LOB in the last 6 months, Ind with ADLs   PT Goals (current goals can now be found in the care plan section) Acute Rehab PT Goals Patient Stated Goal: Improved balance PT Goal Formulation: With patient Time For Goal Achievement: 10/24/19 Potential to Achieve Goals: Good Progress towards PT goals: Progressing toward goals    Frequency    BID      PT Plan Current plan remains appropriate    Co-evaluation              AM-PAC PT "6 Clicks" Mobility   Outcome Measure  Help needed turning from your back to your side while in a flat bed without using bedrails?: A Lot Help needed moving from lying on your back to sitting on the side of a flat bed without using bedrails?: A Lot Help needed moving to and from a bed to a chair (including a wheelchair)?: A Lot Help needed standing up from a chair using your arms (e.g., wheelchair or bedside chair)?: A Lot Help needed to walk in hospital room?: Total Help needed climbing 3-5 steps with a railing? : Total 6 Click Score: 10    End of Session Equipment Utilized During Treatment: Gait belt Activity Tolerance: Patient tolerated treatment well Patient left: in bed;with call bell/phone within reach;with bed alarm set;with SCD's reapplied Nurse Communication: Mobility status PT Visit Diagnosis: Unsteadiness on feet (R26.81);History of falling (Z91.81);Other abnormalities of gait and mobility (R26.89);Muscle  weakness (generalized) (M62.81);Pain Pain - Right/Left: Right Pain - part of body: Hip     Time: 6761-9509 PT Time Calculation (min) (ACUTE ONLY): 27 min  Charges:  $Therapeutic Exercise: 8-22 mins $Therapeutic Activity: 8-22 mins                    D. Scott Derrien Anschutz PT, DPT 10/11/19, 2:50 PM

## 2019-10-12 DIAGNOSIS — Z96649 Presence of unspecified artificial hip joint: Secondary | ICD-10-CM | POA: Insufficient documentation

## 2019-10-12 LAB — SURGICAL PATHOLOGY

## 2019-10-12 MED ORDER — ACETAMINOPHEN 325 MG PO TABS: 325.0000 mg | ORAL_TABLET | Freq: Four times a day (QID) | ORAL | Status: DC | PRN

## 2019-10-12 MED ORDER — TRAMADOL HCL 50 MG PO TABS: 50.0000 mg | ORAL_TABLET | Freq: Four times a day (QID) | ORAL | 0 refills | Status: DC | PRN

## 2019-10-12 NOTE — TOC Transition Note (Signed)
Transition of Care [facility]) - CM/SW Discharge Note   Patient Details  Name: Tiffany Martinez MRN: [id number] Date of Birth: 1934-02-28  [facility]) CM/SW Contact:  Su Hilt, RN Phone Number: 10/12/2019, 4:04 PM   Clinical Narrative:     Patient to Discharge to [facility] today via EMS transport, The bedside nurse is to call EMS.  I called [facility] for transport and they will be here to pick up at 530.  I notified the bedside nurse of the pick up time    Barriers to Discharge: Continued Medical Work up   Patient Goals and CMS Choice Patient states their goals for this hospitalization and ongoing recovery are:: I hope to only go to the rehab building for one week then return to my independent apartment CMS Medicare.gov Compare Post Acute Care list provided to:: Patient    Discharge Placement                       Discharge Plan and Services In-house Referral: Clinical Social Work   Post Acute Care Choice: [place]                               Social Determinants of Health (SDOH) Interventions     Readmission Risk Interventions No flowsheet data found.

## 2019-10-12 NOTE — Progress Notes (Signed)
Called report to [facility]

## 2019-10-12 NOTE — Discharge Summary (Signed)
Physician Discharge Summary  Tiffany Martinez QMV:[id number] DOB: [birth year] DOA: 10/09/2019  PCP: Venia Carbon, MD  Admit date: 10/09/2019 Discharge date: 10/12/2019  Discharge disposition: Skilled nursing facility   Recommendations for Outpatient Follow-Up:   Outpatient follow-up with [facility] clinic orthopedics in 14 days.   Discharge Diagnosis:   Principal Problem:   Closed right hip fracture (HCC) Active Problems:   Hyperlipidemia   Essential hypertension, benign   Chronic diastolic heart failure (HCC)   Unsteady gait   Hypokalemia    Discharge Condition: Stable.  Diet recommendation: Heart healthy diet  Code status: DNR.    Hospital Course:   Tiffany Martinez is an 86 y.o. female with medical history significant ofchronic diastolic CHF, hypertension, venous insufficiency, peripheral artery disease, AAA, hyperlipidemia, chronic anemia, CKD stage IIIb, with history of CLL presented to the ED on 10/09/2019 after sustaining a mechanical fall at home and subsequently with right hip pain and inability to ambulate.    Work-up revealed closed right hip fracture.  She was treated with analgesics.  She was seen in consultation by orthopedic surgeon, Dr. Roland Rack.  Right hip unipolar hemiarthroplasty on 10/10/2019.  She was evaluated by PT and OT who recommended further rehabilitation at a skilled nursing facility.  Her condition has improved and she is deemed stable for discharge to SNF today.    Discharge Exam:   Vitals:   10/12/19 0736 10/12/19 1027  BP: (!) 160/97 (!) 149/89  Pulse: 96 61  Resp: 18   Temp: 98.6 F (37 C)   SpO2: 91% 97%   Vitals:   10/12/19 0005 10/12/19 0344 10/12/19 0736 10/12/19 1027  BP: (!) 178/65 (!) 150/62 (!) 160/97 (!) 149/89  Pulse: 83 75 96 61  Resp: 17  18   Temp: 98.4 F (36.9 C)  98.6 F (37 C)   TempSrc:   Oral   SpO2: 94%  91% 97%  Weight:      Height:         GEN: NAD SKIN: No rash EYES: No pallor or  icterus ENT: MMM CV: RRR PULM: CTA B ABD: soft, ND, NT, +BS CNS: AAO x 3, non focal EXT: Mild right hip swelling and tenderness   The results of significant diagnostics from this hospitalization (including imaging, microbiology, ancillary and laboratory) are listed below for reference.     Procedures and Diagnostic Studies:   DG Chest 1 View  Result Date: 10/09/2019 CLINICAL DATA:  Status post fall. EXAM: CHEST  1 VIEW COMPARISON:  August 24, 2017 FINDINGS: Mild diffuse chronic appearing increased lung markings are seen without evidence of acute infiltrate, pleural effusion or pneumothorax. The heart size and mediastinal contours are within normal limits. There is marked severity calcification of the thoracic aorta. In ill-defined lateral seventh left rib deformity is seen. IMPRESSION: No active disease. Electronically Signed   By: Virgina Norfolk M.D.   On: 10/09/2019 15:51   DG HIP UNILAT W OR W/O PELVIS 2-3 VIEWS RIGHT  Result Date: 10/10/2019 CLINICAL DATA:  Postop. EXAM: DG HIP (WITH OR WITHOUT PELVIS) 2-3V RIGHT COMPARISON:  Oct 09, 2019 FINDINGS: The patient has undergone total hip arthroplasty on the right. The alignment is near anatomic. There are expected postsurgical changes. There is no evidence for hardware fracture or failure. Mild degenerative changes are noted of the left hip. IMPRESSION: Status post right total hip arthroplasty. No evidence for hardware complication or failure. Electronically Signed   By: Constance Holster M.D.   On: 10/10/2019  16:17   DG Hip Unilat  With Pelvis 2-3 Views Right  Result Date: 10/09/2019 CLINICAL DATA:  Fall EXAM: DG HIP (WITH OR WITHOUT PELVIS) 2-3V RIGHT COMPARISON:  None. FINDINGS: There is a fracture through the neck of the right femur with displacement and angulation. Probable calcified fibroid. Aortoiliac stents. IMPRESSION: Acute right femoral neck fracture with displacement and angulation. Electronically Signed   By: Macy Mis  M.D.   On: 10/09/2019 15:47     Labs:   Basic Metabolic Panel: Recent Labs  Lab 10/09/19 1526 10/09/19 1526 10/10/19 0458 10/10/19 0458 10/10/19 1216 10/11/19 0442  NA 137  --  140  --  139 134*  K 3.3*   < > 2.9*   < > 3.6 3.8  CL 104  --  104  --  101 101  CO2 20*  --  26  --   --  23  GLUCOSE 114*  --  106*  --  107* 121*  BUN 31*  --  25*  --  22 22  CREATININE 1.42*  --  1.41*  --  1.50* 1.49*  CALCIUM 9.2  --  9.3  --   --  9.1  MG  --   --  2.1  --   --   --    < > = values in this interval not displayed.   GFR Estimated Creatinine Clearance: 27.1 mL/min (A) (by C-G formula based on SCr of 1.49 mg/dL (H)). Liver Function Tests: No results for input(s): AST, ALT, ALKPHOS, BILITOT, PROT, ALBUMIN in the last 168 hours. No results for input(s): LIPASE, AMYLASE in the last 168 hours. No results for input(s): AMMONIA in the last 168 hours. Coagulation profile Recent Labs  Lab 10/10/19 0458  INR 1.0    CBC: Recent Labs  Lab 10/09/19 1711 10/10/19 0458 10/10/19 1216 10/11/19 0442  WBC 26.3* 20.2*  --  13.5*  NEUTROABS 22.1* 6.4  --   --   HGB 11.2* 11.3* 9.2* 12.0  HCT 31.9* 32.1* 27.0* 35.4*  MCV 97.9 97.9  --  102.0*  PLT 143* 139*  --  98*   Cardiac Enzymes: No results for input(s): CKTOTAL, CKMB, CKMBINDEX, TROPONINI in the last 168 hours. BNP: Invalid input(s): POCBNP CBG: No results for input(s): GLUCAP in the last 168 hours. D-Dimer No results for input(s): DDIMER in the last 72 hours. Hgb A1c No results for input(s): HGBA1C in the last 72 hours. Lipid Profile No results for input(s): CHOL, HDL, LDLCALC, TRIG, CHOLHDL, LDLDIRECT in the last 72 hours. Thyroid function studies No results for input(s): TSH, T4TOTAL, T3FREE, THYROIDAB in the last 72 hours.  Invalid input(s): FREET3 Anemia work up No results for input(s): VITAMINB12, FOLATE, FERRITIN, TIBC, IRON, RETICCTPCT in the last 72 hours. Microbiology Recent Results (from the past 240  hour(s))  SARS Coronavirus 2 by RT PCR (hospital order, performed in [facility] hospital lab) Nasopharyngeal Nasopharyngeal Swab     Status: None   Collection Time: 10/09/19  4:51 PM   Specimen: Nasopharyngeal Swab  Result Value Ref Range Status   SARS Coronavirus 2 NEGATIVE NEGATIVE Final    Comment: (NOTE) SARS-CoV-2 target nucleic acids are NOT DETECTED. The SARS-CoV-2 RNA is generally detectable in upper and lower respiratory specimens during the acute phase of infection. The lowest concentration of SARS-CoV-2 viral copies this assay can detect is 250 copies / mL. A negative result does not preclude SARS-CoV-2 infection and should not be used as the sole basis for  treatment or other patient management decisions.  A negative result may occur with improper specimen collection / handling, submission of specimen other than nasopharyngeal swab, presence of viral mutation(s) within the areas targeted by this assay, and inadequate number of viral copies (<250 copies / mL). A negative result must be combined with clinical observations, patient history, and epidemiological information. Fact Sheet for Patients:   StrictlyIdeas.no Fact Sheet for Healthcare Providers: BankingDealers.co.za This test is not yet approved or cleared  by the [place] FDA and has been authorized for detection and/or diagnosis of SARS-CoV-2 by FDA under an Emergency Use Authorization (EUA).  This EUA will remain in effect (meaning this test can be used) for the duration of the COVID-19 declaration under Section 564(b)(1) of the Act, 21 U.S.C. section 360bbb-3(b)(1), unless the authorization is terminated or revoked sooner. Performed at [facility] [address] [place], [place] [address]   Surgical pcr screen     Status: None   Collection Time: 10/09/19  9:38 PM   Specimen: Nasal Mucosa; Nasal Swab  Result Value Ref Range Status   MRSA, PCR  NEGATIVE NEGATIVE Final   Staphylococcus aureus NEGATIVE NEGATIVE Final    Comment: (NOTE) The Xpert SA Assay (FDA approved for NASAL specimens in patients 76 years of age and older), is one component of a comprehensive surveillance program. It is not intended to diagnose infection nor to guide or monitor treatment. Performed at [facility], [address] [place], [place] [address]   SARS Coronavirus 2 by RT PCR (hospital order, performed in [facility] hospital lab) Nasopharyngeal Nasopharyngeal Swab     Status: None   Collection Time: 10/11/19  5:47 PM   Specimen: Nasopharyngeal Swab  Result Value Ref Range Status   SARS Coronavirus 2 NEGATIVE NEGATIVE Final    Comment: (NOTE) SARS-CoV-2 target nucleic acids are NOT DETECTED. The SARS-CoV-2 RNA is generally detectable in upper and lower respiratory specimens during the acute phase of infection. The lowest concentration of SARS-CoV-2 viral copies this assay can detect is 250 copies / mL. A negative result does not preclude SARS-CoV-2 infection and should not be used as the sole basis for treatment or other patient management decisions.  A negative result may occur with improper specimen collection / handling, submission of specimen other than nasopharyngeal swab, presence of viral mutation(s) within the areas targeted by this assay, and inadequate number of viral copies (<250 copies / mL). A negative result must be combined with clinical observations, patient history, and epidemiological information. Fact Sheet for Patients:   StrictlyIdeas.no Fact Sheet for Healthcare Providers: BankingDealers.co.za This test is not yet approved or cleared  by the [place] FDA and has been authorized for detection and/or diagnosis of SARS-CoV-2 by FDA under an Emergency Use Authorization (EUA).  This EUA will remain in effect (meaning this test can be used) for the duration of  the COVID-19 declaration under Section 564(b)(1) of the Act, 21 U.S.C. section 360bbb-3(b)(1), unless the authorization is terminated or revoked sooner. Performed at [facility], [address] [place], [place] [address]      Discharge Instructions:   Discharge Instructions    Diet - low sodium heart healthy   Complete by: As directed    Increase activity slowly   Complete by: As directed      Allergies as of 10/12/2019      Reactions   Triamterene Hives      Medication List    TAKE these medications  acetaminophen 325 MG tablet Commonly known as: TYLENOL Take 1-2 tablets (325-650 mg total) by mouth every 6 (six) hours as needed for mild pain (pain score 1-3 or temp > 100.5).   amLODipine 5 MG tablet Commonly known as: NORVASC TAKE 1 TABLET(5 MG) BY MOUTH DAILY What changed: See the new instructions.   atorvastatin 20 MG tablet Commonly known as: LIPITOR TAKE 1 TABLET(20 MG) BY MOUTH DAILY   enoxaparin 40 MG/0.4ML injection Commonly known as: LOVENOX Inject 0.4 mLs (40 mg total) into the skin daily.   HYDROcodone-acetaminophen 5-325 MG tablet Commonly known as: NORCO/VICODIN Take 1-2 tablets by mouth every 4 (four) hours as needed for moderate pain.   loratadine 10 MG tablet Commonly known as: CLARITIN Take 10 mg by mouth daily.   potassium chloride SA 20 MEQ tablet Commonly known as: KLOR-CON Take 1 tablet (20 mEq total) by mouth daily.   torsemide 20 MG tablet Commonly known as: DEMADEX Take 1 tablet (20 mg total) by mouth as directed. Take 1 tablet (20 mg) every other day with extra as needed for swelling.   traMADol 50 MG tablet Commonly known as: ULTRAM Take 1 tablet (50 mg total) by mouth every 6 (six) hours as needed for moderate pain.      Follow-up Information    Lattie Corns, PA-C Follow up in 14 day(s).   Specialty: Physician Assistant Why: Electa Sniff information: [place] [address] [phone]            Time coordinating discharge: 29 minutes  Signed:  Coy Rochford  [facility] 10/12/2019, 2:12 PM

## 2019-10-12 NOTE — TOC Transition Note (Signed)
Transition of Care [facility]) - CM/SW Discharge Note   Patient Details  Name: Joetta Delprado MRN: [id number] Date of Birth: [birth year]  Transition of Care [facility]) CM/SW Contact:  Su Hilt, RN Phone Number: 10/12/2019, 3:21 PM   Clinical Narrative:    DC packet put on the chart at 1430 Bedside Nurse made aware, will call EMS when Bedside nurse is ready     Barriers to Discharge: Continued Medical Work up   Patient Goals and CMS Choice Patient states their goals for this hospitalization and ongoing recovery are:: I hope to only go to the rehab building for one week then return to my independent apartment CMS Medicare.gov Compare Post Acute Care list provided to:: Patient    Discharge Placement                       Discharge Plan and Services In-house Referral: Clinical Social Work   Post Acute Care Choice: [place]                               Social Determinants of Health (SDOH) Interventions     Readmission Risk Interventions No flowsheet data found.

## 2019-10-12 NOTE — Plan of Care (Signed)
  Problem: Education: Goal: Knowledge of General Education information will improve Description: Including pain rating scale, medication(s)/side effects and non-pharmacologic comfort measures Outcome: Progressing   Problem: Clinical Measurements: Goal: Ability to maintain clinical measurements within normal limits will improve Outcome: Progressing Goal: Will remain free from infection Outcome: Progressing Goal: Diagnostic test results will improve Outcome: Progressing Goal: Respiratory complications will improve Outcome: Progressing Goal: Cardiovascular complication will be avoided Outcome: Progressing   Problem: Activity: Goal: Risk for activity intolerance will decrease Outcome: Progressing   Problem: Coping: Goal: Level of anxiety will decrease Outcome: Progressing   Problem: Pain Managment: Goal: General experience of comfort will improve Outcome: Progressing   

## 2019-10-12 NOTE — Progress Notes (Signed)
Physical Therapy Treatment Patient Details Name: Tiffany Martinez MRN: [id number] DOB: 05-30-1933 Today's Date: 10/12/2019    History of Present Illness Per MD notes: Pt is an 86 y.o. female with medical history significant of chronic diastolic CHF, hypertension, venous insufficiency, peripheral artery disease, AAA, hyperlipidemia, chronic anemia, CKD stage IIIb, with history of CLL presented to the ED today after sustaining a mechanical fall at home and subsequently with right hip pain and inability to ambulate.  Pt diagnosed with a right displaced femoral neck fracture and is s/p right hip unipolar hemiarthroplasty.    PT Comments    Pt pleasant and motivated to participate during the session.  Pt continued to require mod A with bed mobility tasks to manage her RLE in and out of bed and for trunk control.  Pt was able to stand multiple times from an elevated EOB with min A but required extensive multi-modal cues and training on proper sequencing with poor carryover.  During each standing session amb was attempted but pt was unable to advance either LE secondary to RLE pain and weakness.  Pt c/o minor nausea near the end of the session and was returned to supine with nursing present and aware.  Pt will benefit from PT services in a SNF setting upon discharge to safely address deficits listed in patient problem list for decreased caregiver assistance and eventual return to PLOF.     Follow Up Recommendations  SNF     Equipment Recommendations  Rolling walker with 5" wheels;3in1 (PT)    Recommendations for Other Services       Precautions / Restrictions Precautions Precautions: Posterior Hip Precaution Booklet Issued: Yes (comment) Restrictions Weight Bearing Restrictions: Yes RLE Weight Bearing: Weight bearing as tolerated    Mobility  Bed Mobility Overal bed mobility: Needs Assistance Bed Mobility: Supine to Sit;Sit to Supine     Supine to sit: Mod assist Sit to supine: Mod  assist   General bed mobility comments: Mod A for RLE and trunk control  Transfers Overall transfer level: Needs assistance Equipment used: Rolling walker (2 wheeled) Transfers: Sit to/from Stand Sit to Stand: From elevated surface;Min assist         General transfer comment: Max verbal and tactile cues for sequencing  Ambulation/Gait             General Gait Details: Pt unable to advance either LE during multiple amb attempts this session   Stairs             Wheelchair Mobility    Modified Rankin (Stroke Patients Only)       Balance Overall balance assessment: Needs assistance   Sitting balance-Leahy Scale: Fair     Standing balance support: Bilateral upper extremity supported;During functional activity Standing balance-Leahy Scale: Poor                              Cognition Arousal/Alertness: Awake/alert Behavior During Therapy: WFL for tasks assessed/performed Overall Cognitive Status: Within Functional Limits for tasks assessed                                        Exercises Total Joint Exercises Ankle Circles/Pumps: Strengthening;Both;10 reps;15 reps Quad Sets: Strengthening;Both;5 reps;10 reps Gluteal Sets: Strengthening;Both;5 reps;10 reps Towel Squeeze: Strengthening;Both;10 reps Hip ABduction/ADduction: AAROM;Right;10 reps;5 reps Straight Leg Raises: AAROM;Right;5 reps Long Arc Quad: Strengthening;Both;10 reps;15  reps Knee Flexion: Strengthening;Both;10 reps;15 reps Other Exercises Other Exercises: Extensive posterior hip precaution review per handout and during functional mobility Other Exercises: HEP education and review per handout    General Comments        Pertinent Vitals/Pain Pain Assessment: 0-10 Pain Score: 5  Pain Location: R hip Pain Descriptors / Indicators: Aching;Sore Pain Intervention(s): Premedicated before session;Monitored during session    Home Living                       Prior Function            PT Goals (current goals can now be found in the care plan section) Progress towards PT goals: Not progressing toward goals - comment(Pt limited by RLE pain and weakness)    Frequency    BID      PT Plan Current plan remains appropriate    Co-evaluation              AM-PAC PT "6 Clicks" Mobility   Outcome Measure  Help needed turning from your back to your side while in a flat bed without using bedrails?: A Lot Help needed moving from lying on your back to sitting on the side of a flat bed without using bedrails?: A Lot Help needed moving to and from a bed to a chair (including a wheelchair)?: A Lot Help needed standing up from a chair using your arms (e.g., wheelchair or bedside chair)?: A Lot Help needed to walk in hospital room?: Total Help needed climbing 3-5 steps with a railing? : Total 6 Click Score: 10    End of Session Equipment Utilized During Treatment: Gait belt Activity Tolerance: Patient tolerated treatment well Patient left: in bed;with call bell/phone within reach;with bed alarm set;with SCD's reapplied;with nursing/sitter in room Nurse Communication: Mobility status PT Visit Diagnosis: Unsteadiness on feet (R26.81);History of falling (Z91.81);Other abnormalities of gait and mobility (R26.89);Muscle weakness (generalized) (M62.81);Pain Pain - Right/Left: Right Pain - part of body: Hip     Time: 5784-6962 PT Time Calculation (min) (ACUTE ONLY): 38 min  Charges:  $Therapeutic Exercise: 23-37 mins $Therapeutic Activity: 8-22 mins                     D. Scott Mikeala Girdler PT, DPT 10/12/19, 10:41 AM

## 2019-10-12 NOTE — Care Management Important Message (Signed)
Important Message  Patient Details  Name: Tiffany Martinez MRN: [id number] Date of Birth: [birth year]   Medicare Important Message Given:  Yes     Dannette Barbara 10/12/2019, 1:38 PM

## 2019-10-12 NOTE — Progress Notes (Signed)
Subjective: 2 Days Post-Op Procedure(s) (LRB): ARTHROPLASTY BIPOLAR HIP (HEMIARTHROPLASTY) (Right) Patient reports pain as 6 on 0-10 scale.   Patient is well but has has nausea today. Plan is to go Skilled nursing facility after hospital stay. Negative for chest pain and shortness of breath Fever: no Gastrointestinal:Negative for vomiting.  Objective: Vital signs in last 24 hours: Temp:  [97.7 F (36.5 C)-98.4 F (36.9 C)] 98.4 F (36.9 C) (05/28 0005) Pulse Rate:  [74-84] 75 (05/28 0344) Resp:  [17-20] 17 (05/28 0005) BP: (150-178)/(53-65) 150/62 (05/28 0344) SpO2:  [92 %-94 %] 94 % (05/28 0005)  Intake/Output from previous day:  Intake/Output Summary (Last 24 hours) at 10/12/2019 0709 Last data filed at 10/11/2019 1921 Gross per 24 hour  Intake 240 ml  Output 190 ml  Net 50 ml    Intake/Output this shift: No intake/output data recorded.  Labs: Recent Labs    10/09/19 1711 10/10/19 0458 10/10/19 1216 10/11/19 0442  HGB 11.2* 11.3* 9.2* 12.0   Recent Labs    10/10/19 0458 10/10/19 0458 10/10/19 1216 10/11/19 0442  WBC 20.2*  --   --  13.5*  RBC 3.28*  --   --  3.47*  HCT 32.1*   < > 27.0* 35.4*  PLT 139*  --   --  98*   < > = values in this interval not displayed.   Recent Labs    10/10/19 0458 10/10/19 0458 10/10/19 1216 10/11/19 0442  NA 140   < > 139 134*  K 2.9*   < > 3.6 3.8  CL 104   < > 101 101  CO2 26  --   --  23  BUN 25*   < > 22 22  CREATININE 1.41*   < > 1.50* 1.49*  GLUCOSE 106*   < > 107* 121*  CALCIUM 9.3  --   --  9.1   < > = values in this interval not displayed.   Recent Labs    10/10/19 0458  INR 1.0     EXAM General - Patient is Alert, Appropriate and Oriented Extremity - ABD soft Neurovascular intact Sensation intact distally Intact pulses distally Dorsiflexion/Plantar flexion intact Incision: dressing C/D/I No cellulitis present Dressing/Incision - clean, dry, no drainage Motor Function - intact, moving foot  and toes well on exam.  Past Medical History:  Diagnosis Date  . AAA (abdominal aortic aneurysm) ([place]) 2012   3.9cm, rec rpt 1 yr  . Anemia   . Anxiety   . CHF (congestive heart failure) ([place])   . Chronic venous insufficiency   . CLL (chronic lymphocytic leukemia) ([place]) 2014   Stage 0--by flow cytometry  . Hx of colonoscopy   . Hyperlipidemia   . Hypertension   . Melanoma ([place])    Right Arm  . Migraine headache    3-4X/yr  . PAD (peripheral artery disease) ([place]) 2012   severe R common iliac stenosis, ABI 0.73, mid L common iliac stenosis ABI 1.1  . PAD (peripheral artery disease) ([place])   . Wears dentures    full upper and lower    Assessment/Plan: 2 Days Post-Op Procedure(s) (LRB): ARTHROPLASTY BIPOLAR HIP (HEMIARTHROPLASTY) (Right) Principal Problem:   Closed right hip fracture (HCC) Active Problems:   Hyperlipidemia   Essential hypertension, benign   Chronic diastolic heart failure (HCC)   Unsteady gait   Hypokalemia  Estimated body mass index is 25.75 kg/m as calculated from the following:   Height as of this encounter: 5\' 4"  (1.626  m).   Weight as of this encounter: 68 kg. Advance diet Up with therapy D/C IV fluids when tolerating po intake.  Labs reviewed this AM. Hg 12.0 this AM. Continue with PT. Begin working on BM. Plan for discharge to SNF.  Upon discharge, follow-up with [facility] in 14 days for staple removal. Continue Lovenox 40mg  daily for 14 days.   DVT Prophylaxis - Lovenox, Foot Pumps and TED hose Weight-Bearing as tolerated to right leg  Reche Dixon PA-C [facility] 10/12/2019, 7:09 AM

## 2019-10-12 NOTE — Progress Notes (Signed)
PT Cancellation Note  Patient Details Name: Tiffany Martinez MRN: [id number] DOB: Jan 25, 1934   Cancelled Treatment:    Reason Eval/Treat Not Completed: Patient declined PM physical therapy session secondary to fatigue and preparing for discharge to SNF.  Will attempt to see pt at a future date if discharge is held.  Linus Salmons PT, DPT 10/12/19, 1:37 PM

## 2019-10-16 ENCOUNTER — Other Ambulatory Visit: Payer: Self-pay

## 2019-10-16 ENCOUNTER — Emergency Department: Payer: Medicare Other

## 2019-10-16 ENCOUNTER — Emergency Department
Admission: EM | Admit: 2019-10-16 | Discharge: 2019-10-16 | Disposition: A | Discharge status: Home / Self Care | Payer: Medicare Other | Attending: Emergency Medicine | Admitting: Emergency Medicine

## 2019-10-16 DIAGNOSIS — S99911A Unspecified injury of right ankle, initial encounter: Secondary | ICD-10-CM | POA: Diagnosis present

## 2019-10-16 DIAGNOSIS — Z96641 Presence of right artificial hip joint: Secondary | ICD-10-CM | POA: Insufficient documentation

## 2019-10-16 DIAGNOSIS — J449 Chronic obstructive pulmonary disease, unspecified: Secondary | ICD-10-CM | POA: Insufficient documentation

## 2019-10-16 DIAGNOSIS — F17210 Nicotine dependence, cigarettes, uncomplicated: Secondary | ICD-10-CM | POA: Insufficient documentation

## 2019-10-16 DIAGNOSIS — I10 Essential (primary) hypertension: Secondary | ICD-10-CM | POA: Diagnosis not present

## 2019-10-16 DIAGNOSIS — R0902 Hypoxemia: Secondary | ICD-10-CM | POA: Diagnosis not present

## 2019-10-16 DIAGNOSIS — W19XXXA Unspecified fall, initial encounter: Secondary | ICD-10-CM | POA: Diagnosis not present

## 2019-10-16 DIAGNOSIS — N183 Chronic kidney disease, stage 3 unspecified: Secondary | ICD-10-CM | POA: Insufficient documentation

## 2019-10-16 DIAGNOSIS — S82831A Other fracture of upper and lower end of right fibula, initial encounter for closed fracture: Secondary | ICD-10-CM | POA: Insufficient documentation

## 2019-10-16 DIAGNOSIS — Y999 Unspecified external cause status: Secondary | ICD-10-CM | POA: Diagnosis not present

## 2019-10-16 DIAGNOSIS — S79911A Unspecified injury of right hip, initial encounter: Secondary | ICD-10-CM | POA: Diagnosis not present

## 2019-10-16 DIAGNOSIS — Z7901 Long term (current) use of anticoagulants: Secondary | ICD-10-CM | POA: Diagnosis not present

## 2019-10-16 DIAGNOSIS — Z79899 Other long term (current) drug therapy: Secondary | ICD-10-CM | POA: Diagnosis not present

## 2019-10-16 DIAGNOSIS — I130 Hypertensive heart and chronic kidney disease with heart failure and stage 1 through stage 4 chronic kidney disease, or unspecified chronic kidney disease: Secondary | ICD-10-CM | POA: Insufficient documentation

## 2019-10-16 DIAGNOSIS — Y92129 Unspecified place in nursing home as the place of occurrence of the external cause: Secondary | ICD-10-CM | POA: Insufficient documentation

## 2019-10-16 DIAGNOSIS — J439 Emphysema, unspecified: Secondary | ICD-10-CM | POA: Diagnosis not present

## 2019-10-16 DIAGNOSIS — C9110 Chronic lymphocytic leukemia of B-cell type not having achieved remission: Secondary | ICD-10-CM | POA: Diagnosis not present

## 2019-10-16 DIAGNOSIS — S72001A Fracture of unspecified part of neck of right femur, initial encounter for closed fracture: Secondary | ICD-10-CM

## 2019-10-16 DIAGNOSIS — Z7401 Bed confinement status: Secondary | ICD-10-CM | POA: Diagnosis not present

## 2019-10-16 DIAGNOSIS — I5032 Chronic diastolic (congestive) heart failure: Secondary | ICD-10-CM | POA: Insufficient documentation

## 2019-10-16 DIAGNOSIS — Y939 Activity, unspecified: Secondary | ICD-10-CM | POA: Insufficient documentation

## 2019-10-16 DIAGNOSIS — M2550 Pain in unspecified joint: Secondary | ICD-10-CM | POA: Diagnosis not present

## 2019-10-16 DIAGNOSIS — I749 Embolism and thrombosis of unspecified artery: Secondary | ICD-10-CM | POA: Diagnosis not present

## 2019-10-16 DIAGNOSIS — R52 Pain, unspecified: Secondary | ICD-10-CM | POA: Diagnosis not present

## 2019-10-16 MED ORDER — MORPHINE SULFATE (PF) 4 MG/ML IV SOLN
4.0000 mg | Freq: Once | INTRAVENOUS | Status: AC
  Administered 2019-10-16: 4 mg via INTRAVENOUS
  Filled 2019-10-16: qty 1

## 2019-10-16 MED ORDER — ONDANSETRON HCL 4 MG/2ML IJ SOLN
4.0000 mg | Freq: Once | INTRAMUSCULAR | Status: AC
  Administered 2019-10-16: 4 mg via INTRAVENOUS
  Filled 2019-10-16: qty 2

## 2019-10-16 NOTE — Discharge Instructions (Addendum)
Patient has sustained a small fracture in the ankle.  I have talked with the orthopedic surgeon regarding the patient's care, continuation of rehab for her hip.  Patient may walk and bear weight on the ankle as tolerated.  Patient may use the walking boot provided, or if tolerated, Ace bandage while ambulating on her ankle.  This is the primarily nonweightbearing bone of the ankle.  If there is any questions on specifics that patient may tolerate as far as physical therapy, contact the orthopedic surgeon.

## 2019-10-16 NOTE — ED Provider Notes (Signed)
[facility] Emergency Department Provider Note  ____________________________________________  Time seen: Approximately 9:42 PM  I have reviewed the triage vital signs and the nursing notes.   HISTORY  Chief Complaint Fall    HPI Tiffany Martinez is a 86 y.o. female who presents the emergency department via EMS from rehab center with a complaint of fall and right ankle injury.  Patient was seen in this department on 10/09/2019 after a fall with a right hip fracture.  This was surgically repaired and patient had been at rehab.  She states that she was doing well.  Tonight she was attempting to get into her bed.  She states that she had pressed her call bell, had waited for a long time and attempted to transition from the chair to the bed herself.  Patient states that she reached out, had a hold of the bed but did not realize this put her on the edge of the chair causing her to slide out injuring her ankle.  Patient reports pain, swelling to the lateral ankle.  She states that in this injury she caught her ankle and slid into the floor.  She did not land on the right hip at all.  She has no pain in her hip.  No other injury other than her right ankle.  Patient denies hitting her head or losing consciousness.  She denies any neck or back pain.  No hip pain.  No knee pain.  Patient's only complaint is right lateral ankle pain.  No medications prior to arrival.  Patient has a history of recent hip fracture with repair, AAA, anxiety, CHF, CLL, hypertension, peripheral artery disease.   Patient reports that her blood pressure has been elevated recently.  Primary care is aware, currently managing her blood pressure.  Patient's blood pressure is currently 199/72.  She states that it is been in the 962I/297L systolic for the past week.  Again primary care is aware and is currently managing        Past Medical History:  Diagnosis Date   AAA (abdominal aortic aneurysm) ([place]) 2012    3.9cm, rec rpt 1 yr   Anemia    Anxiety    CHF (congestive heart failure) ([place])    Chronic venous insufficiency    CLL (chronic lymphocytic leukemia) ([place]) 2014   Stage 0--by flow cytometry   Hx of colonoscopy    Hyperlipidemia    Hypertension    Melanoma ([place])    Right Arm   Migraine headache    3-4X/yr   PAD (peripheral artery disease) ([place]) 2012   severe R common iliac stenosis, ABI 0.73, mid L common iliac stenosis ABI 1.1   PAD (peripheral artery disease) ([place])    Wears dentures    full upper and lower    Patient Active Problem List   Diagnosis Date Noted   Closed right hip fracture ([place]) 10/09/2019   Unsteady gait 10/09/2019   Hypokalemia 10/09/2019   Weakness 05/30/2019   Secondary hyperparathyroidism of renal origin ([place]) 06/30/2018   Insomnia 02/02/2018   COPD (chronic obstructive pulmonary disease) ([place]) 12/26/2017   Chronic renal disease, stage III 12/26/2017   Aortic atherosclerosis ([place]) 09/14/2017   CLL (chronic lymphocytic leukemia) ([place]) 09/14/2017   Chronic diastolic heart failure ([place]) 08/25/2017   Advanced directives, counseling/discussion 01/15/2014   AAA (abdominal aortic aneurysm) ([place])    Routine general medical examination at a health care facility 07/07/2012   History of melanoma 12/24/2011   Peripheral arterial disease ([place])  12/21/2010   History of squamous cell carcinoma of skin 09/28/2010   Chronic anemia 04/02/2010   VENOUS INSUFFICIENCY, CHRONIC 04/02/2010   Hyperlipidemia 06/23/2009   Episodic mood disorder ([place]) 06/23/2009   Essential hypertension, benign 06/23/2009    Past Surgical History:  Procedure Laterality Date   ABDOMINAL AORTIC ANEURYSM REPAIR  02/16/12   [facility]--Dr Sammuel Hines   CARDIOVASCULAR STRESS TEST  2008   negative   CATARACT EXTRACTION W/PHACO Left 07/16/2019   Procedure: CATARACT EXTRACTION PHACO AND INTRAOCULAR LENS PLACEMENT (IOC) LEFT 8.26  00:46.6;  Surgeon: Eulogio Bear,  MD;  Location: [facility];  Service: Ophthalmology;  Laterality: Left;   DOBUTAMINE STRESS ECHO  11/11   negative   HIP ARTHROPLASTY Right 10/10/2019   Procedure: ARTHROPLASTY BIPOLAR HIP (HEMIARTHROPLASTY);  Surgeon: Corky Mull, MD;  Location: [facility];  Service: Orthopedics;  Laterality: Right;   KNEE ARTHROSCOPY  1998   right   MELANOMA EXCISION  11/09/10   wide excision right upper arm   SQUAMOUS CELL CARCINOMA EXCISION  02/2016   TONSILLECTOMY AND ADENOIDECTOMY  1960    Prior to Admission medications   Medication Sig Start Date End Date Taking? Authorizing Provider  acetaminophen (TYLENOL) 325 MG tablet Take 1-2 tablets (325-650 mg total) by mouth every 6 (six) hours as needed for mild pain (pain score 1-3 or temp > 100.5). 10/12/19   Jennye Boroughs, MD  amLODipine (NORVASC) 5 MG tablet TAKE 1 TABLET(5 MG) BY MOUTH DAILY Patient taking differently: Take 5 mg by mouth daily.  08/20/19   Venia Carbon, MD  atorvastatin (LIPITOR) 20 MG tablet TAKE 1 TABLET(20 MG) BY MOUTH DAILY 08/30/19   Viviana Simpler I, MD  enoxaparin (LOVENOX) 40 MG/0.4ML injection Inject 0.4 mLs (40 mg total) into the skin daily. 10/11/19   Lattie Corns, PA-C  HYDROcodone-acetaminophen (NORCO/VICODIN) 5-325 MG tablet Take 1-2 tablets by mouth every 4 (four) hours as needed for moderate pain. 10/11/19   Lattie Corns, PA-C  loratadine (CLARITIN) 10 MG tablet Take 10 mg by mouth daily.    [provider]  potassium chloride SA (K-DUR) 20 MEQ tablet Take 1 tablet (20 mEq total) by mouth daily. 11/14/18   Venia Carbon, MD  torsemide (DEMADEX) 20 MG tablet Take 1 tablet (20 mg total) by mouth as directed. Take 1 tablet (20 mg) every other day with extra as needed for swelling. 08/13/19 11/11/19  Gollan, Kathlene November, MD  traMADol (ULTRAM) 50 MG tablet Take 1 tablet (50 mg total) by mouth every 6 (six) hours as needed for moderate pain. 10/12/19   Reche Dixon, PA-C     Allergies Triamterene  Family History  Problem Relation Age of Onset   Alzheimer's disease Mother    Pancreatic cancer Father    Parkinson's disease Brother    Coronary artery disease Neg Hx    Diabetes Neg Hx    Cancer Neg Hx        breast or colon    Social History Social History   Tobacco Use   Smoking status: Light Tobacco Smoker    Types: Cigarettes    Last attempt to quit: 08/15/2017    Years since quitting: 2.1   Smokeless tobacco: Never Used   Tobacco comment: occassionally  Substance Use Topics   Alcohol use: Yes    Alcohol/week: 7.0 standard drinks    Types: 7 Glasses of wine per week    Comment: regular wine with dinner   Drug use: No  Review of Systems  Constitutional: No fever/chills Eyes: No visual changes. No discharge ENT: No upper respiratory complaints. Cardiovascular: no chest pain. Respiratory: no cough. No SOB. Gastrointestinal: No abdominal pain.  No nausea, no vomiting.  No diarrhea.  No constipation. Musculoskeletal: Right ankle pain/injury Skin: Negative for rash, abrasions, lacerations, ecchymosis. Neurological: Negative for headaches, focal weakness or numbness. 10-point ROS otherwise negative.  ____________________________________________   PHYSICAL EXAM:  VITAL SIGNS: ED Triage Vitals  Enc Vitals Group     BP 10/16/19 2039 (!) 199/72     Pulse Rate 10/16/19 2039 73     Resp 10/16/19 2039 18     Temp 10/16/19 2039 98.2 F (36.8 C)     Temp Source 10/16/19 2039 Oral     SpO2 10/16/19 2039 96 %     Weight 10/16/19 2036 150 lb (68 kg)     Height 10/16/19 2036 5\' 4"  (1.626 m)     Head Circumference --      Peak Flow --      Pain Score 10/16/19 2035 8     Pain Loc --      Pain Edu? --      Excl. in [place]? --      Constitutional: Alert and oriented. Well appearing and in no acute distress. Eyes: Conjunctivae are normal. PERRL. EOMI. Head: Atraumatic. ENT:      Ears:       Nose: No congestion/rhinnorhea.       Mouth/Throat: Mucous membranes are moist.  Neck: No stridor.    Cardiovascular: Normal rate, regular rhythm. Normal S1 and S2.  Good peripheral circulation. Respiratory: Normal respiratory effort without tachypnea or retractions. Lungs CTAB. Good air entry to the bases with no decreased or absent breath sounds. Musculoskeletal: Full range of motion to all extremities. No gross deformities appreciated.  Visualization of the right ankle reveals edema along the lateral malleolus.  No deformity.  Patient has good range of motion currently.  Tender to palpation of the distal fibula with no palpable abnormality.  No other significant tenderness to palpation over the osseous structures of the leg.  Right hip is also visualized.  No deformity.  No shortening or rotation of the right lower leg.  Surgical site is intact with no evidence of dehiscence or further injury. Neurologic:  Normal speech and language. No gross focal neurologic deficits are appreciated.  Skin:  Skin is warm, dry and intact. No rash noted. Psychiatric: Mood and affect are normal. Speech and behavior are normal. Patient exhibits appropriate insight and judgement.   ____________________________________________   LABS (all labs ordered are listed, but only abnormal results are displayed)  Labs Reviewed - No data to display ____________________________________________  EKG   ____________________________________________  RADIOLOGY I personally viewed and evaluated these images as part of my medical decision making, as well as reviewing the written report by the radiologist.  DG Ankle Complete Right  Result Date: 10/16/2019 CLINICAL DATA:  Recent fall with ankle pain, initial encounter EXAM: RIGHT ANKLE - COMPLETE 3+ VIEW COMPARISON:  None FINDINGS: Soft tissue swelling is noted laterally. There is a minimally displaced distal fibular fracture identified best seen on the oblique and lateral images. No other fracture is seen.  IMPRESSION: Oblique fracture through the distal fibula with associated soft tissue swelling. Electronically Signed   By: Inez Catalina M.D.   On: 10/16/2019 21:45   DG Hip [place] W or [place] Pelvis 1 View Right  Result Date: 10/16/2019 CLINICAL DATA:  Recent fall  EXAM: DG HIP (WITH OR WITHOUT PELVIS) 1V PORT RIGHT COMPARISON:  10/10/2019 FINDINGS: Right hip replacement is noted. Is well seated within the acetabulum. No acute fracture or dislocation is noted. Calcified uterine fibroids are seen. No other focal abnormality is noted. IMPRESSION: Right hip replacement without acute abnormality. Electronically Signed   By: Inez Catalina M.D.   On: 10/16/2019 21:46    ____________________________________________    PROCEDURES  Procedure(s) performed:    Procedures    Medications  morphine 4 MG/ML injection 4 mg (has no administration in time range)  ondansetron (ZOFRAN) injection 4 mg (has no administration in time range)     ____________________________________________   INITIAL IMPRESSION / ASSESSMENT AND PLAN / ED COURSE  Pertinent labs & imaging results that were available during my care of the patient were reviewed by me and considered in my medical decision making (see chart for details).  Review of the [place] CSRS was performed in accordance of the [place] prior to dispensing any controlled drugs.           Patient's diagnosis is consistent with fall, distal fibula fracture.  Patient presented to the emergency department after a fall.  Patient was attempting to transition from her chair to the bed.  Patient thought she was holding onto the bed but before she could get a good grasp she slid out of the chair landing awkwardly on her ankle.  Patient had fallen a week ago, sustained a hip fracture that was surgically repaired.  There is no evidence of additional trauma to the hip and patient had no other complaints other than ankle pain.  Imaging reveals oblique fracture to the distal  fibula.  Given the fact that patient is currently undergoing rehab for the hip fracture I discussed the patient with on-call orthopedic surgeon, Dr. Rudene Christians.  Given that this was the nonweightbearing bone of the ankle I discussed whether the patient needed a splint versus walking boot/Ace bandage.  Orthopedics advised that patient may bear weight on the ankle to continue rehab.  She may use walking boot or Ace bandage as tolerated..  Patient did have elevated blood pressure readings.  This is known and being followed by her primary care.  Readings are consistent with readings over the past week.  Patient did not hit her head or lose consciousness.  There was again no other injury or complaint.  Imaging of the hip is stable.  Follow-up with orthopedics for ankle fracture in addition to known hip fracture.  Patient is given ED precautions to return to the ED for any worsening or new symptoms.     ____________________________________________  FINAL CLINICAL IMPRESSION(S) / ED DIAGNOSES  Final diagnoses:  Fall, initial encounter  Other closed fracture of distal end of right fibula, initial encounter      NEW MEDICATIONS STARTED DURING THIS VISIT:  ED Discharge Orders    None          This chart was dictated using voice recognition software/Dragon. Despite best efforts to proofread, errors can occur which can change the meaning. Any change was purely unintentional.    Darletta Moll, PA-C 10/16/19 2221    Arta Silence, MD 10/17/19 [phone]

## 2019-10-16 NOTE — ED Triage Notes (Addendum)
Pt arrives [facility] from [facility] w cc of fall.  Was just admitted w hip surgery here last week ,PT has been working w pt. Pt fell unwitnessed, R ankle hurts, pt given 4mg  zofran w EMS. 20G L hand  194/69 HR 77 RR 17 O2 97% ra Pt normally takes 1 norvasc in the morning. Pt's doc instructed her to take another this afternoon. BP still high

## 2019-10-18 DIAGNOSIS — S82401A Unspecified fracture of shaft of right fibula, initial encounter for closed fracture: Secondary | ICD-10-CM | POA: Diagnosis not present

## 2019-11-09 DIAGNOSIS — I5030 Unspecified diastolic (congestive) heart failure: Secondary | ICD-10-CM | POA: Diagnosis not present

## 2019-11-09 DIAGNOSIS — E785 Hyperlipidemia, unspecified: Secondary | ICD-10-CM | POA: Diagnosis not present

## 2019-11-09 DIAGNOSIS — I951 Orthostatic hypotension: Secondary | ICD-10-CM | POA: Diagnosis not present

## 2019-11-09 DIAGNOSIS — R279 Unspecified lack of coordination: Secondary | ICD-10-CM | POA: Diagnosis not present

## 2019-11-09 DIAGNOSIS — I5032 Chronic diastolic (congestive) heart failure: Secondary | ICD-10-CM | POA: Diagnosis not present

## 2019-11-09 DIAGNOSIS — R4189 Other symptoms and signs involving cognitive functions and awareness: Secondary | ICD-10-CM | POA: Diagnosis not present

## 2019-11-09 DIAGNOSIS — J449 Chronic obstructive pulmonary disease, unspecified: Secondary | ICD-10-CM | POA: Diagnosis not present

## 2019-11-09 DIAGNOSIS — S82401A Unspecified fracture of shaft of right fibula, initial encounter for closed fracture: Secondary | ICD-10-CM | POA: Diagnosis not present

## 2019-11-09 DIAGNOSIS — N1832 Chronic kidney disease, stage 3b: Secondary | ICD-10-CM | POA: Diagnosis not present

## 2019-11-09 DIAGNOSIS — I130 Hypertensive heart and chronic kidney disease with heart failure and stage 1 through stage 4 chronic kidney disease, or unspecified chronic kidney disease: Secondary | ICD-10-CM | POA: Diagnosis not present

## 2019-11-09 DIAGNOSIS — R2681 Unsteadiness on feet: Secondary | ICD-10-CM | POA: Diagnosis not present

## 2019-11-09 DIAGNOSIS — I739 Peripheral vascular disease, unspecified: Secondary | ICD-10-CM | POA: Diagnosis not present

## 2019-11-09 DIAGNOSIS — S7291XD Unspecified fracture of right femur, subsequent encounter for closed fracture with routine healing: Secondary | ICD-10-CM | POA: Diagnosis not present

## 2019-11-09 DIAGNOSIS — R269 Unspecified abnormalities of gait and mobility: Secondary | ICD-10-CM | POA: Diagnosis not present

## 2019-11-09 DIAGNOSIS — S72001D Fracture of unspecified part of neck of right femur, subsequent encounter for closed fracture with routine healing: Secondary | ICD-10-CM | POA: Diagnosis not present

## 2019-11-09 DIAGNOSIS — E876 Hypokalemia: Secondary | ICD-10-CM | POA: Diagnosis not present

## 2019-11-09 DIAGNOSIS — S72031D Displaced midcervical fracture of right femur, subsequent encounter for closed fracture with routine healing: Secondary | ICD-10-CM | POA: Diagnosis not present

## 2019-11-09 DIAGNOSIS — R2689 Other abnormalities of gait and mobility: Secondary | ICD-10-CM | POA: Diagnosis not present

## 2019-11-09 DIAGNOSIS — R278 Other lack of coordination: Secondary | ICD-10-CM | POA: Diagnosis not present

## 2019-11-09 DIAGNOSIS — D649 Anemia, unspecified: Secondary | ICD-10-CM | POA: Diagnosis not present

## 2019-11-09 DIAGNOSIS — S72001A Fracture of unspecified part of neck of right femur, initial encounter for closed fracture: Secondary | ICD-10-CM | POA: Diagnosis not present

## 2019-11-09 DIAGNOSIS — I10 Essential (primary) hypertension: Secondary | ICD-10-CM | POA: Diagnosis not present

## 2019-11-09 DIAGNOSIS — J439 Emphysema, unspecified: Secondary | ICD-10-CM | POA: Diagnosis not present

## 2019-11-09 DIAGNOSIS — X58XXXD Exposure to other specified factors, subsequent encounter: Secondary | ICD-10-CM | POA: Diagnosis not present

## 2019-11-09 DIAGNOSIS — M6281 Muscle weakness (generalized): Secondary | ICD-10-CM | POA: Diagnosis not present

## 2019-11-09 DIAGNOSIS — C9110 Chronic lymphocytic leukemia of B-cell type not having achieved remission: Secondary | ICD-10-CM | POA: Diagnosis not present

## 2019-11-16 DIAGNOSIS — I5030 Unspecified diastolic (congestive) heart failure: Secondary | ICD-10-CM | POA: Diagnosis not present

## 2019-11-16 DIAGNOSIS — I951 Orthostatic hypotension: Secondary | ICD-10-CM | POA: Diagnosis not present

## 2019-11-20 DIAGNOSIS — I739 Peripheral vascular disease, unspecified: Secondary | ICD-10-CM | POA: Diagnosis not present

## 2019-11-20 DIAGNOSIS — I10 Essential (primary) hypertension: Secondary | ICD-10-CM | POA: Diagnosis not present

## 2019-11-20 DIAGNOSIS — I5030 Unspecified diastolic (congestive) heart failure: Secondary | ICD-10-CM | POA: Diagnosis not present

## 2019-11-20 DIAGNOSIS — S82401A Unspecified fracture of shaft of right fibula, initial encounter for closed fracture: Secondary | ICD-10-CM | POA: Diagnosis not present

## 2019-11-20 DIAGNOSIS — J439 Emphysema, unspecified: Secondary | ICD-10-CM | POA: Diagnosis not present

## 2019-11-20 DIAGNOSIS — S72001A Fracture of unspecified part of neck of right femur, initial encounter for closed fracture: Secondary | ICD-10-CM | POA: Diagnosis not present

## 2019-11-26 DIAGNOSIS — S8264XA Nondisplaced fracture of lateral malleolus of right fibula, initial encounter for closed fracture: Secondary | ICD-10-CM | POA: Insufficient documentation

## 2019-11-26 DIAGNOSIS — S72031D Displaced midcervical fracture of right femur, subsequent encounter for closed fracture with routine healing: Secondary | ICD-10-CM | POA: Diagnosis not present

## 2019-11-29 ENCOUNTER — Encounter: Payer: Self-pay | Admitting: Internal Medicine

## 2019-11-29 ENCOUNTER — Other Ambulatory Visit: Payer: Self-pay

## 2019-11-29 ENCOUNTER — Ambulatory Visit (INDEPENDENT_AMBULATORY_CARE_PROVIDER_SITE_OTHER): Payer: Medicare Other | Admitting: Internal Medicine

## 2019-11-29 DIAGNOSIS — S72001D Fracture of unspecified part of neck of right femur, subsequent encounter for closed fracture with routine healing: Secondary | ICD-10-CM

## 2019-11-29 DIAGNOSIS — E441 Mild protein-calorie malnutrition: Secondary | ICD-10-CM | POA: Diagnosis not present

## 2019-11-29 DIAGNOSIS — N1832 Chronic kidney disease, stage 3b: Secondary | ICD-10-CM

## 2019-11-29 MED ORDER — ALENDRONATE SODIUM 70 MG PO TABS: 70.0000 mg | ORAL_TABLET | ORAL | 3 refills | Status: DC

## 2019-11-29 NOTE — Assessment & Plan Note (Signed)
Has been stable in low 30's

## 2019-11-29 NOTE — Progress Notes (Signed)
Subjective:    Patient ID: Tiffany Martinez, female    DOB: 22-Aug-1933, 86 y.o.   MRN: [id number]  HPI Here for follow up after hospitalization for hip fracture ---then to rehab After that, she fell again with right fibular fracture as well This visit occurred during the SARS-CoV-2 public health emergency.  Safety protocols were in place, including screening questions prior to the visit, additional usage of staff PPE, and extensive cleaning of exam room while observing appropriate contact time as indicated for disinfecting solutions.   Got home 2 days ago Healed well from the right hip fracture Just out of her boot for the fibula fracture Has brace to use--but hard to get on  Has been walking in her place Gilford Rile is "broken" so just holding on around her place Has wheelchair also   Has aide coming 2 days this week----aides in very short supply Will help with showers Dresses, bathroom herself  Getting food from [place] brought her to the grocery store this AM  Did have some pain along the medial right knee She feels it is from the weight of the heavy boot Better now with ROM and getting rid of boot  Current Outpatient Medications on File Prior to Visit  Medication Sig Dispense Refill  . acetaminophen (TYLENOL) 325 MG tablet Take 1-2 tablets (325-650 mg total) by mouth every 6 (six) hours as needed for mild pain (pain score 1-3 or temp > 100.5).    Marland Kitchen amLODipine (NORVASC) 5 MG tablet TAKE 1 TABLET(5 MG) BY MOUTH DAILY (Patient taking differently: Take 5 mg by mouth daily. ) 90 tablet 3  . atorvastatin (LIPITOR) 20 MG tablet TAKE 1 TABLET(20 MG) BY MOUTH DAILY 90 tablet 3  . HYDROcodone-acetaminophen (NORCO/VICODIN) 5-325 MG tablet Take 1-2 tablets by mouth every 4 (four) hours as needed for moderate pain. 60 tablet 0  . loratadine (CLARITIN) 10 MG tablet Take 10 mg by mouth daily.    . potassium chloride SA (K-DUR) 20 MEQ tablet Take 1 tablet (20 mEq total) by mouth daily.  90 tablet 1  . traMADol (ULTRAM) 50 MG tablet Take 1 tablet (50 mg total) by mouth every 6 (six) hours as needed for moderate pain. 30 tablet 0  . torsemide (DEMADEX) 20 MG tablet Take 1 tablet (20 mg total) by mouth as directed. Take 1 tablet (20 mg) every other day with extra as needed for swelling. 180 tablet 3   No current facility-administered medications on file prior to visit.    Allergies  Allergen Reactions  . Triamterene Hives    Past Medical History:  Diagnosis Date  . AAA (abdominal aortic aneurysm) ([place]) 2012   3.9cm, rec rpt 1 yr  . Anemia   . Anxiety   . CHF (congestive heart failure) ([place])   . Chronic venous insufficiency   . CLL (chronic lymphocytic leukemia) ([place]) 2014   Stage 0--by flow cytometry  . Hx of colonoscopy   . Hyperlipidemia   . Hypertension   . Melanoma ([place])    Right Arm  . Migraine headache    3-4X/yr  . PAD (peripheral artery disease) ([place]) 2012   severe R common iliac stenosis, ABI 0.73, mid L common iliac stenosis ABI 1.1  . PAD (peripheral artery disease) ([place])   . Wears dentures    full upper and lower    Past Surgical History:  Procedure Laterality Date  . ABDOMINAL AORTIC ANEURYSM REPAIR  02/16/12   [facility]--Dr Sammuel Hines  . CARDIOVASCULAR STRESS TEST  2008   negative  . CATARACT EXTRACTION W/PHACO Left 07/16/2019   Procedure: CATARACT EXTRACTION PHACO AND INTRAOCULAR LENS PLACEMENT (IOC) LEFT 8.26  00:46.6;  Surgeon: Eulogio Bear, MD;  Location: [facility];  Service: Ophthalmology;  Laterality: Left;  . DOBUTAMINE STRESS ECHO  11/11   negative  . HIP ARTHROPLASTY Right 10/10/2019   Procedure: ARTHROPLASTY BIPOLAR HIP (HEMIARTHROPLASTY);  Surgeon: Corky Mull, MD;  Location: [facility];  Service: Orthopedics;  Laterality: Right;  . KNEE ARTHROSCOPY  1998   right  . MELANOMA EXCISION  11/09/10   wide excision right upper arm  . SQUAMOUS CELL CARCINOMA EXCISION  02/2016  . TONSILLECTOMY AND ADENOIDECTOMY  1960    Family  History  Problem Relation Age of Onset  . Alzheimer's disease Mother   . Pancreatic cancer Father   . Parkinson's disease Brother   . Coronary artery disease Neg Hx   . Diabetes Neg Hx   . Cancer Neg Hx        breast or colon    Social History   Socioeconomic History  . Marital status: Widowed    Spouse name: Not on file  . Number of children: 4  . Years of education: Not on file  . Highest education level: Not on file  Occupational History  . Occupation: retiredCommunity education officer,   Tobacco Use  . Smoking status: Light Tobacco Smoker    Types: Cigarettes    Last attempt to quit: 08/15/2017    Years since quitting: 2.2  . Smokeless tobacco: Never Used  . Tobacco comment: occassionally  Vaping Use  . Vaping Use: Never used  Substance and Sexual Activity  . Alcohol use: Yes    Alcohol/week: 7.0 standard drinks    Types: 7 Glasses of wine per week    Comment: regular wine with dinner  . Drug use: No  . Sexual activity: Never  Other Topics Concern  . Not on file  Social History Narrative   Widowed 2/11 -2nd for him , 1st for her. 4 step sons. 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 (alternate would be someone else with his company)   Requests DNR   Discussed MOST form---but would probably accept IV fluids/antibiotics, etc   Requests no feeding tube         Social Determinants of Health   Financial Resource Strain:   . Difficulty of Paying Living Expenses:   Food Insecurity:   . Worried About Charity fundraiser in the Last Year:   . Arboriculturist in the Last Year:   Transportation Needs:   . Film/video editor (Medical):   Marland Kitchen Lack of Transportation (Non-Medical):   Physical Activity:   . Days of Exercise per Week:   . Minutes of Exercise per Session:   Stress:   . Feeling of Stress :   Social Connections:   . Frequency of Communication with Friends and  Family:   . Frequency of Social Gatherings with Friends and Family:   . Attends Religious Services:   . Active Member of Clubs or Organizations:   . Attends Archivist Meetings:   Marland Kitchen Marital Status:   Intimate Partner Violence:   . Fear of Current or Ex-Partner:   . Emotionally Abused:   Marland Kitchen Physically Abused:   . Sexually Abused:  Review of Systems Appetite is off--but enjoying fresh fruit Is drinking boost in the AM and occasionally other times Sleeping well Having trouble peeing---a lot at night and not as much in the day No sig leg swelling No chest pain or SOB Has been losing some weight     Objective:   Physical Exam Constitutional:      Appearance: Normal appearance.  Cardiovascular:     Rate and Rhythm: Normal rate and regular rhythm.     Heart sounds: No murmur heard.  No gallop.   Pulmonary:     Effort: Pulmonary effort is normal.     Breath sounds: Normal breath sounds. No wheezing or rales.  Musculoskeletal:     Cervical back: Neck supple.     Right lower leg: No edema.     Left lower leg: No edema.  Lymphadenopathy:     Cervical: No cervical adenopathy.  Neurological:     Mental Status: She is alert.     Comments: Strength 4/5 in right arm  Psychiatric:        Mood and Affect: Mood normal.        Behavior: Behavior normal.            Assessment & Plan:

## 2019-11-29 NOTE — Assessment & Plan Note (Addendum)
Has recovered well  Also from the fibular fracture Discussed home care needs Will start alendronate

## 2019-11-29 NOTE — Assessment & Plan Note (Signed)
Has lost about 10% of her body weight over the past 6-9 months Discussed improving diet Add regular boost

## 2019-12-12 DIAGNOSIS — R278 Other lack of coordination: Secondary | ICD-10-CM | POA: Diagnosis not present

## 2019-12-12 DIAGNOSIS — I739 Peripheral vascular disease, unspecified: Secondary | ICD-10-CM | POA: Diagnosis not present

## 2019-12-12 DIAGNOSIS — N1832 Chronic kidney disease, stage 3b: Secondary | ICD-10-CM | POA: Diagnosis not present

## 2019-12-12 DIAGNOSIS — Z741 Need for assistance with personal care: Secondary | ICD-10-CM | POA: Diagnosis not present

## 2019-12-12 DIAGNOSIS — S72001D Fracture of unspecified part of neck of right femur, subsequent encounter for closed fracture with routine healing: Secondary | ICD-10-CM | POA: Diagnosis not present

## 2019-12-12 DIAGNOSIS — J449 Chronic obstructive pulmonary disease, unspecified: Secondary | ICD-10-CM | POA: Diagnosis not present

## 2019-12-12 DIAGNOSIS — I5032 Chronic diastolic (congestive) heart failure: Secondary | ICD-10-CM | POA: Diagnosis not present

## 2019-12-12 DIAGNOSIS — R4189 Other symptoms and signs involving cognitive functions and awareness: Secondary | ICD-10-CM | POA: Diagnosis not present

## 2019-12-14 DIAGNOSIS — N1832 Chronic kidney disease, stage 3b: Secondary | ICD-10-CM | POA: Diagnosis not present

## 2019-12-14 DIAGNOSIS — J449 Chronic obstructive pulmonary disease, unspecified: Secondary | ICD-10-CM | POA: Diagnosis not present

## 2019-12-14 DIAGNOSIS — I5032 Chronic diastolic (congestive) heart failure: Secondary | ICD-10-CM | POA: Diagnosis not present

## 2019-12-14 DIAGNOSIS — I739 Peripheral vascular disease, unspecified: Secondary | ICD-10-CM | POA: Diagnosis not present

## 2019-12-14 DIAGNOSIS — S72001D Fracture of unspecified part of neck of right femur, subsequent encounter for closed fracture with routine healing: Secondary | ICD-10-CM | POA: Diagnosis not present

## 2019-12-14 DIAGNOSIS — R278 Other lack of coordination: Secondary | ICD-10-CM | POA: Diagnosis not present

## 2019-12-17 DIAGNOSIS — J449 Chronic obstructive pulmonary disease, unspecified: Secondary | ICD-10-CM | POA: Diagnosis not present

## 2019-12-17 DIAGNOSIS — Z741 Need for assistance with personal care: Secondary | ICD-10-CM | POA: Diagnosis not present

## 2019-12-17 DIAGNOSIS — R278 Other lack of coordination: Secondary | ICD-10-CM | POA: Diagnosis not present

## 2019-12-17 DIAGNOSIS — R4189 Other symptoms and signs involving cognitive functions and awareness: Secondary | ICD-10-CM | POA: Diagnosis not present

## 2019-12-17 DIAGNOSIS — S72001D Fracture of unspecified part of neck of right femur, subsequent encounter for closed fracture with routine healing: Secondary | ICD-10-CM | POA: Diagnosis not present

## 2019-12-17 DIAGNOSIS — I5032 Chronic diastolic (congestive) heart failure: Secondary | ICD-10-CM | POA: Diagnosis not present

## 2019-12-17 DIAGNOSIS — I739 Peripheral vascular disease, unspecified: Secondary | ICD-10-CM | POA: Diagnosis not present

## 2019-12-17 DIAGNOSIS — N1832 Chronic kidney disease, stage 3b: Secondary | ICD-10-CM | POA: Diagnosis not present

## 2019-12-18 DIAGNOSIS — S72001D Fracture of unspecified part of neck of right femur, subsequent encounter for closed fracture with routine healing: Secondary | ICD-10-CM | POA: Diagnosis not present

## 2019-12-18 DIAGNOSIS — I739 Peripheral vascular disease, unspecified: Secondary | ICD-10-CM | POA: Diagnosis not present

## 2019-12-18 DIAGNOSIS — R278 Other lack of coordination: Secondary | ICD-10-CM | POA: Diagnosis not present

## 2019-12-18 DIAGNOSIS — J449 Chronic obstructive pulmonary disease, unspecified: Secondary | ICD-10-CM | POA: Diagnosis not present

## 2019-12-18 DIAGNOSIS — N1832 Chronic kidney disease, stage 3b: Secondary | ICD-10-CM | POA: Diagnosis not present

## 2019-12-18 DIAGNOSIS — I5032 Chronic diastolic (congestive) heart failure: Secondary | ICD-10-CM | POA: Diagnosis not present

## 2019-12-19 DIAGNOSIS — J449 Chronic obstructive pulmonary disease, unspecified: Secondary | ICD-10-CM | POA: Diagnosis not present

## 2019-12-19 DIAGNOSIS — S72001D Fracture of unspecified part of neck of right femur, subsequent encounter for closed fracture with routine healing: Secondary | ICD-10-CM | POA: Diagnosis not present

## 2019-12-19 DIAGNOSIS — I739 Peripheral vascular disease, unspecified: Secondary | ICD-10-CM | POA: Diagnosis not present

## 2019-12-19 DIAGNOSIS — I5032 Chronic diastolic (congestive) heart failure: Secondary | ICD-10-CM | POA: Diagnosis not present

## 2019-12-19 DIAGNOSIS — R278 Other lack of coordination: Secondary | ICD-10-CM | POA: Diagnosis not present

## 2019-12-19 DIAGNOSIS — N1832 Chronic kidney disease, stage 3b: Secondary | ICD-10-CM | POA: Diagnosis not present

## 2019-12-24 DIAGNOSIS — I5032 Chronic diastolic (congestive) heart failure: Secondary | ICD-10-CM | POA: Diagnosis not present

## 2019-12-24 DIAGNOSIS — S72001D Fracture of unspecified part of neck of right femur, subsequent encounter for closed fracture with routine healing: Secondary | ICD-10-CM | POA: Diagnosis not present

## 2019-12-24 DIAGNOSIS — N1832 Chronic kidney disease, stage 3b: Secondary | ICD-10-CM | POA: Diagnosis not present

## 2019-12-24 DIAGNOSIS — J449 Chronic obstructive pulmonary disease, unspecified: Secondary | ICD-10-CM | POA: Diagnosis not present

## 2019-12-24 DIAGNOSIS — R278 Other lack of coordination: Secondary | ICD-10-CM | POA: Diagnosis not present

## 2019-12-24 DIAGNOSIS — I739 Peripheral vascular disease, unspecified: Secondary | ICD-10-CM | POA: Diagnosis not present

## 2019-12-26 DIAGNOSIS — I5032 Chronic diastolic (congestive) heart failure: Secondary | ICD-10-CM | POA: Diagnosis not present

## 2019-12-26 DIAGNOSIS — N1832 Chronic kidney disease, stage 3b: Secondary | ICD-10-CM | POA: Diagnosis not present

## 2019-12-26 DIAGNOSIS — R278 Other lack of coordination: Secondary | ICD-10-CM | POA: Diagnosis not present

## 2019-12-26 DIAGNOSIS — J449 Chronic obstructive pulmonary disease, unspecified: Secondary | ICD-10-CM | POA: Diagnosis not present

## 2019-12-26 DIAGNOSIS — I739 Peripheral vascular disease, unspecified: Secondary | ICD-10-CM | POA: Diagnosis not present

## 2019-12-26 DIAGNOSIS — S72001D Fracture of unspecified part of neck of right femur, subsequent encounter for closed fracture with routine healing: Secondary | ICD-10-CM | POA: Diagnosis not present

## 2019-12-28 DIAGNOSIS — R278 Other lack of coordination: Secondary | ICD-10-CM | POA: Diagnosis not present

## 2019-12-28 DIAGNOSIS — I5032 Chronic diastolic (congestive) heart failure: Secondary | ICD-10-CM | POA: Diagnosis not present

## 2019-12-28 DIAGNOSIS — I739 Peripheral vascular disease, unspecified: Secondary | ICD-10-CM | POA: Diagnosis not present

## 2019-12-28 DIAGNOSIS — N1832 Chronic kidney disease, stage 3b: Secondary | ICD-10-CM | POA: Diagnosis not present

## 2019-12-28 DIAGNOSIS — J449 Chronic obstructive pulmonary disease, unspecified: Secondary | ICD-10-CM | POA: Diagnosis not present

## 2019-12-28 DIAGNOSIS — S72001D Fracture of unspecified part of neck of right femur, subsequent encounter for closed fracture with routine healing: Secondary | ICD-10-CM | POA: Diagnosis not present

## 2019-12-31 DIAGNOSIS — R278 Other lack of coordination: Secondary | ICD-10-CM | POA: Diagnosis not present

## 2019-12-31 DIAGNOSIS — I739 Peripheral vascular disease, unspecified: Secondary | ICD-10-CM | POA: Diagnosis not present

## 2019-12-31 DIAGNOSIS — S72001D Fracture of unspecified part of neck of right femur, subsequent encounter for closed fracture with routine healing: Secondary | ICD-10-CM | POA: Diagnosis not present

## 2019-12-31 DIAGNOSIS — J449 Chronic obstructive pulmonary disease, unspecified: Secondary | ICD-10-CM | POA: Diagnosis not present

## 2019-12-31 DIAGNOSIS — N1832 Chronic kidney disease, stage 3b: Secondary | ICD-10-CM | POA: Diagnosis not present

## 2019-12-31 DIAGNOSIS — I5032 Chronic diastolic (congestive) heart failure: Secondary | ICD-10-CM | POA: Diagnosis not present

## 2020-01-02 DIAGNOSIS — R278 Other lack of coordination: Secondary | ICD-10-CM | POA: Diagnosis not present

## 2020-01-02 DIAGNOSIS — J449 Chronic obstructive pulmonary disease, unspecified: Secondary | ICD-10-CM | POA: Diagnosis not present

## 2020-01-02 DIAGNOSIS — I739 Peripheral vascular disease, unspecified: Secondary | ICD-10-CM | POA: Diagnosis not present

## 2020-01-02 DIAGNOSIS — N1832 Chronic kidney disease, stage 3b: Secondary | ICD-10-CM | POA: Diagnosis not present

## 2020-01-02 DIAGNOSIS — S72001D Fracture of unspecified part of neck of right femur, subsequent encounter for closed fracture with routine healing: Secondary | ICD-10-CM | POA: Diagnosis not present

## 2020-01-02 DIAGNOSIS — I5032 Chronic diastolic (congestive) heart failure: Secondary | ICD-10-CM | POA: Diagnosis not present

## 2020-01-04 ENCOUNTER — Other Ambulatory Visit: Payer: Self-pay | Admitting: *Deleted

## 2020-01-04 DIAGNOSIS — R278 Other lack of coordination: Secondary | ICD-10-CM | POA: Diagnosis not present

## 2020-01-04 DIAGNOSIS — I739 Peripheral vascular disease, unspecified: Secondary | ICD-10-CM | POA: Diagnosis not present

## 2020-01-04 DIAGNOSIS — I5032 Chronic diastolic (congestive) heart failure: Secondary | ICD-10-CM | POA: Diagnosis not present

## 2020-01-04 DIAGNOSIS — N1832 Chronic kidney disease, stage 3b: Secondary | ICD-10-CM | POA: Diagnosis not present

## 2020-01-04 DIAGNOSIS — J449 Chronic obstructive pulmonary disease, unspecified: Secondary | ICD-10-CM | POA: Diagnosis not present

## 2020-01-04 DIAGNOSIS — S72001D Fracture of unspecified part of neck of right femur, subsequent encounter for closed fracture with routine healing: Secondary | ICD-10-CM | POA: Diagnosis not present

## 2020-01-04 NOTE — Patient Outreach (Signed)
[facility] Post- Acute Care Coordinator follow up.   Clarification from Admissions Coordinator at [facility]. Member is currently in ILF receiving outpatient therapy services not SNF.   No identifiable [facility] Care Management needs.   Marthenia Rolling, MSN-Ed, RN,BSN [place] Acute Care Coordinator [phone] [facility]) [phone]  (Toll free office)

## 2020-01-04 NOTE — Patient Outreach (Signed)
Member screened for potential [facility] Care Management needs as a benefit of [place] Medicare.  Per Patient Pearletha Forge, Mrs. Bridwell  is residing at [facility]).   Communication was sent to [facility] Admissions Coordinator to inquire about anticipated transition plans and potential [facility] Care Management needs.  Will continue to follow for potential [facility] services while member resides in SNF.  Marthenia Rolling, MSN-Ed, RN,BSN [place] Acute Care Coordinator [phone] [facility])

## 2020-01-07 DIAGNOSIS — I739 Peripheral vascular disease, unspecified: Secondary | ICD-10-CM | POA: Diagnosis not present

## 2020-01-07 DIAGNOSIS — J449 Chronic obstructive pulmonary disease, unspecified: Secondary | ICD-10-CM | POA: Diagnosis not present

## 2020-01-07 DIAGNOSIS — I5032 Chronic diastolic (congestive) heart failure: Secondary | ICD-10-CM | POA: Diagnosis not present

## 2020-01-07 DIAGNOSIS — R278 Other lack of coordination: Secondary | ICD-10-CM | POA: Diagnosis not present

## 2020-01-07 DIAGNOSIS — S72001D Fracture of unspecified part of neck of right femur, subsequent encounter for closed fracture with routine healing: Secondary | ICD-10-CM | POA: Diagnosis not present

## 2020-01-07 DIAGNOSIS — N1832 Chronic kidney disease, stage 3b: Secondary | ICD-10-CM | POA: Diagnosis not present

## 2020-01-09 DIAGNOSIS — N1832 Chronic kidney disease, stage 3b: Secondary | ICD-10-CM | POA: Diagnosis not present

## 2020-01-09 DIAGNOSIS — J449 Chronic obstructive pulmonary disease, unspecified: Secondary | ICD-10-CM | POA: Diagnosis not present

## 2020-01-09 DIAGNOSIS — I5032 Chronic diastolic (congestive) heart failure: Secondary | ICD-10-CM | POA: Diagnosis not present

## 2020-01-09 DIAGNOSIS — I739 Peripheral vascular disease, unspecified: Secondary | ICD-10-CM | POA: Diagnosis not present

## 2020-01-09 DIAGNOSIS — R278 Other lack of coordination: Secondary | ICD-10-CM | POA: Diagnosis not present

## 2020-01-09 DIAGNOSIS — S72001D Fracture of unspecified part of neck of right femur, subsequent encounter for closed fracture with routine healing: Secondary | ICD-10-CM | POA: Diagnosis not present

## 2020-01-28 ENCOUNTER — Encounter: Payer: Medicare Other | Admitting: Internal Medicine

## 2020-02-01 NOTE — Progress Notes (Signed)
Cardiology Office Note  Date:  02/04/2020   ID:  Tiffany Martinez, DOB 06-16-1933, MRN [id number]  PCP:  Venia Carbon, MD   Chief Complaint  Patient presents with  . Other    6 month f/u c/o off balance. Meds reviewed verbally with pt.    HPI:  86 year-old woman   lower extremity PAD.  severe right common iliac stenosis   infrarenal AAA. 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 occluded at its origin but reconstitutes distally unchanged on CT 2017 Copd/smoker Hx of falls diastolic CHF/hypertensive heart disease Presents for f/u of her diastolic CHF born in [place] and spent much time in [place] and [place]  Lives at [place] [facility] 10/2019, mechanical 2 wks later broke ankle S/p nondisplaced fracture Slow recovery Uses a walker, no pain, Not on pain medications  Lost a lot of weight, 153 to 137  Compared to 07/2019 Drinking boost  In rehab, labile pressure 80 up to 867 systolic  Labs: reviewed CR 1.49, sodium 134  EKG personally reviewed by myself on todays visit NSR rate 63 bpm, no ST or T wave changes   Other past medical hx reviewed Echo 09/2017  Left ventricle: The cavity size was normal. There was mild  concentric hypertrophy. Systolic function was normal. The  estimated ejection fraction was in the range of 60% to 65%. Wall  motion was normal; there were no regional wall motion  abnormalities. Features are consistent with a pseudonormal left  ventricular filling pattern, with concomitant abnormal relaxation  and increased filling pressure (grade 2 diastolic dysfunction).  - Aortic valve: There was mild regurgitation.  - Left atrium: The atrium was mildly dilated.  - Pulmonary arteries: Systolic pressure was mildly increased. PA  peak pressure: 43 mm Hg (S).   In the hospital 08/2017 LE edema, sob Records reviewed sbp 180, was not taking her htn medications Was prescribed norvasc D/c on lasix and  norvasc  Prior Doppler studies reviewed Abdominal aortic duplex and ABI's showed severe right common iliac stenosis and an ABI of 0.73 on the right with no significant disease on the left and a normal ABI on that side.  There was also notation of a 3.9 cm infrarenal AAA   PMH:   has a past medical history of AAA (abdominal aortic aneurysm) ([place]) (2012), Anemia, Anxiety, CHF (congestive heart failure) ([place]), Chronic venous insufficiency, CLL (chronic lymphocytic leukemia) ([place]) (2014), colonoscopy, Hyperlipidemia, Hypertension, Melanoma ([place]), Migraine headache, PAD (peripheral artery disease) ([place]) (2012), PAD (peripheral artery disease) ([place]), and Wears dentures.  PSH:    Past Surgical History:  Procedure Laterality Date  . ABDOMINAL AORTIC ANEURYSM REPAIR  02/16/12   [facility]--Dr Sammuel Hines  . CARDIOVASCULAR STRESS TEST  2008   negative  . CATARACT EXTRACTION W/PHACO Left 07/16/2019   Procedure: CATARACT EXTRACTION PHACO AND INTRAOCULAR LENS PLACEMENT (IOC) LEFT 8.26  00:46.6;  Surgeon: Eulogio Bear, MD;  Location: [place] [facility];  Service: Ophthalmology;  Laterality: Left;  . DOBUTAMINE STRESS ECHO  11/11   negative  . HIP ARTHROPLASTY Right 10/10/2019   Procedure: ARTHROPLASTY BIPOLAR HIP (HEMIARTHROPLASTY);  Surgeon: Corky Mull, MD;  Location: [facility];  Service: Orthopedics;  Laterality: Right;  . KNEE ARTHROSCOPY  1998   right  . MELANOMA EXCISION  11/09/10   wide excision right upper arm  . SQUAMOUS CELL CARCINOMA EXCISION  02/2016  . TONSILLECTOMY AND ADENOIDECTOMY  1960    Current Outpatient Medications  Medication Sig Dispense Refill  .  acetaminophen (TYLENOL) 325 MG tablet Take 1-2 tablets (325-650 mg total) by mouth every 6 (six) hours as needed for mild pain (pain score 1-3 or temp > 100.5).    Marland Kitchen alendronate (FOSAMAX) 70 MG tablet Take 1 tablet (70 mg total) by mouth once a week. Take with a full glass of water on an empty stomach. 13 tablet 3  . amLODipine (NORVASC) 5  MG tablet TAKE 1 TABLET(5 MG) BY MOUTH DAILY (Patient taking differently: Take 5 mg by mouth daily. ) 90 tablet 3  . atorvastatin (LIPITOR) 20 MG tablet TAKE 1 TABLET(20 MG) BY MOUTH DAILY 90 tablet 3  . cholecalciferol (VITAMIN D3) 25 MCG (1000 UNIT) tablet Take 1,000 Units by mouth daily.    Marland Kitchen HYDROcodone-acetaminophen (NORCO/VICODIN) 5-325 MG tablet Take 1-2 tablets by mouth every 4 (four) hours as needed for moderate pain. 60 tablet 0  . loratadine (CLARITIN) 10 MG tablet Take 10 mg by mouth daily.    . Multiple Vitamins-Minerals (MULTIVITAMIN WITH MINERALS) tablet Take 1 tablet by mouth daily.    . potassium chloride SA (K-DUR) 20 MEQ tablet Take 1 tablet (20 mEq total) by mouth daily. 90 tablet 1  . traMADol (ULTRAM) 50 MG tablet Take 1 tablet (50 mg total) by mouth every 6 (six) hours as needed for moderate pain. 30 tablet 0  . torsemide (DEMADEX) 20 MG tablet Take 1 tablet (20 mg total) by mouth as directed. Take 1 tablet (20 mg) every other day with extra as needed for swelling. 180 tablet 3   No current facility-administered medications for this visit.    Allergies:   Triamterene   Social History:  The patient  reports that she has been smoking cigarettes. She has never used smokeless tobacco. She reports current alcohol use of about 7.0 standard drinks of alcohol per week. She reports that she does not use drugs.   Family History:   family history includes Alzheimer's disease in her mother; Pancreatic cancer in her father; Parkinson's disease in her brother.    Review of Systems: Review of Systems  Constitutional: Negative.   HENT: Negative.   Respiratory: Positive for shortness of breath.   Cardiovascular: Negative.   Gastrointestinal: Negative.   Musculoskeletal: Negative.   Neurological: Negative.   Psychiatric/Behavioral: Negative.   All other systems reviewed and are negative.   PHYSICAL EXAM: VS:  BP (!) 178/64 (BP Location: Left Arm, Patient Position: Sitting, Cuff  Size: Normal)   Pulse 63   Ht 5\' 4"  (1.626 m)   Wt 137 lb 4 oz (62.3 kg)   LMP  (LMP Unknown)   SpO2 97%   BMI 23.56 kg/m  , BMI Body mass index is 23.56 kg/m. Constitutional:  oriented to person, place, and time. No distress.  Presenting in a wheelchair HENT:  Head: Grossly normal Eyes:  no discharge. No scleral icterus.  Neck: No JVD, no carotid bruits  Cardiovascular: Regular rate and rhythm, no murmurs appreciated Pulmonary/Chest: Clear to auscultation bilaterally, no wheezes or rails Abdominal: Soft.  no distension.  no tenderness.  Musculoskeletal: Normal range of motion Neurological:  normal muscle tone. Coordination normal. No atrophy Skin: Skin warm and dry Psychiatric: normal affect, pleasant  Recent Labs: 10/10/2019: Magnesium 2.1 10/11/2019: BUN 22; Creatinine, Ser 1.49; Hemoglobin 12.0; Platelets 98; Potassium 3.8; Sodium 134    Lipid Panel Lab Results  Component Value Date   CHOL 120 12/26/2017   HDL 34.70 (L) 12/26/2017   LDLCALC 52 12/26/2017   TRIG 165.0 (H) 12/26/2017  Wt Readings from Last 3 Encounters:  02/04/20 137 lb 4 oz (62.3 kg)  11/29/19 144 lb (65.3 kg)  10/16/19 150 lb (68 kg)     ASSESSMENT AND PLAN:  Problem List Items Addressed This Visit      Cardiology Problems   Chronic diastolic heart failure (HCC) (Chronic)   Relevant Orders   EKG 12-Lead   Essential hypertension, benign   Peripheral arterial disease (HCC) - Primary   Relevant Orders   EKG 12-Lead   AAA (abdominal aortic aneurysm) (HCC)   Aortic atherosclerosis (HCC)    Other Visit Diagnoses    Tobacco use         Chronic diastolic CHF Appears euvolemic, No medication changes made  Fatigue Recovering from hip fracture, gait instability Recommend she continue her PT  Hypertension Blood pressure elevated today, anxious coming into the office She will monitor blood pressure at home and call us with some numbers Will avoid hypotension given prior history of  falls  COPD Long smoking history, chronic cough, Likely chronic bronchitis Reports her breathing is stable    Total encounter time more than 25 minutes  Greater than 50% was spent in counseling and coordination of care with the patient    Signed, Esmond Plants, M.D., Ph.D. [place], [place]

## 2020-02-04 ENCOUNTER — Other Ambulatory Visit: Payer: Self-pay

## 2020-02-04 ENCOUNTER — Encounter: Payer: Self-pay | Admitting: Cardiovascular Disease

## 2020-02-04 ENCOUNTER — Ambulatory Visit (INDEPENDENT_AMBULATORY_CARE_PROVIDER_SITE_OTHER): Payer: Medicare Other | Admitting: Cardiovascular Disease

## 2020-02-04 VITALS — BP 178/64 | HR 63 | Ht 64.0 in | Wt 137.2 lb

## 2020-02-04 DIAGNOSIS — I739 Peripheral vascular disease, unspecified: Secondary | ICD-10-CM

## 2020-02-04 DIAGNOSIS — Z720 Tobacco use: Secondary | ICD-10-CM

## 2020-02-04 DIAGNOSIS — I714 Abdominal aortic aneurysm, without rupture, unspecified: Secondary | ICD-10-CM

## 2020-02-04 DIAGNOSIS — I10 Essential (primary) hypertension: Secondary | ICD-10-CM

## 2020-02-04 DIAGNOSIS — I700 Atherosclerosis of aorta: Secondary | ICD-10-CM

## 2020-02-04 DIAGNOSIS — I5032 Chronic diastolic (congestive) heart failure: Secondary | ICD-10-CM | POA: Diagnosis not present

## 2020-02-04 NOTE — Patient Instructions (Signed)
Medication Instructions:  No changes  If you need a refill on your cardiac medications before your next appointment, please call your pharmacy.    Lab work: No new labs needed   If you have labs (blood work) drawn today and your tests are completely normal, you will receive your results only by: . MyChart Message (if you have MyChart) OR . A paper copy in the mail If you have any lab test that is abnormal or we need to change your treatment, we will call you to review the results.   Testing/Procedures: No new testing needed   Follow-Up: At [facility], you and your health needs are our priority.  As part of our continuing mission to provide you with exceptional heart care, we have created designated Provider Care Teams.  These Care Teams include your primary Cardiologist (physician) and Advanced Practice Providers (APPs -  Physician Assistants and Nurse Practitioners) who all work together to provide you with the care you need, when you need it.  . You will need a follow up appointment in 6 months  . Providers on your designated Care Team:   . Christopher Berge, NP . Ryan Dunn, PA-C . Jacquelyn Visser, PA-C  Any Other Special Instructions Will Be Listed Below (If Applicable).  COVID-19 Vaccine Information can be found at: [email or web address] For questions related to vaccine distribution or appointments, please email [email or web address] or call [phone].     

## 2020-02-21 ENCOUNTER — Encounter: Payer: Self-pay | Admitting: Internal Medicine

## 2020-02-21 ENCOUNTER — Ambulatory Visit (INDEPENDENT_AMBULATORY_CARE_PROVIDER_SITE_OTHER): Payer: Medicare Other | Admitting: Internal Medicine

## 2020-02-21 ENCOUNTER — Other Ambulatory Visit: Payer: Self-pay

## 2020-02-21 VITALS — BP 162/66 | HR 67 | Temp 97.4°F | Ht 64.5 in | Wt 137.0 lb

## 2020-02-21 DIAGNOSIS — I10 Essential (primary) hypertension: Secondary | ICD-10-CM

## 2020-02-21 DIAGNOSIS — C9110 Chronic lymphocytic leukemia of B-cell type not having achieved remission: Secondary | ICD-10-CM

## 2020-02-21 DIAGNOSIS — N1832 Chronic kidney disease, stage 3b: Secondary | ICD-10-CM

## 2020-02-21 DIAGNOSIS — J449 Chronic obstructive pulmonary disease, unspecified: Secondary | ICD-10-CM

## 2020-02-21 DIAGNOSIS — F39 Unspecified mood [affective] disorder: Secondary | ICD-10-CM | POA: Diagnosis not present

## 2020-02-21 DIAGNOSIS — I5032 Chronic diastolic (congestive) heart failure: Secondary | ICD-10-CM

## 2020-02-21 DIAGNOSIS — Z0000 Encounter for general adult medical examination without abnormal findings: Secondary | ICD-10-CM | POA: Diagnosis not present

## 2020-02-21 DIAGNOSIS — Z7189 Other specified counseling: Secondary | ICD-10-CM | POA: Diagnosis not present

## 2020-02-21 DIAGNOSIS — Z23 Encounter for immunization: Secondary | ICD-10-CM | POA: Diagnosis not present

## 2020-02-21 MED ORDER — LOSARTAN POTASSIUM 25 MG PO TABS: 25.0000 mg | ORAL_TABLET | Freq: Every day | ORAL | 3 refills | Status: DC

## 2020-02-21 NOTE — Progress Notes (Signed)
Subjective:    Patient ID: Tiffany Martinez, female    DOB: 04/03/1934, 86 y.o.   MRN: [id number]  HPI Here for Medicare wellness visit and follow up of chronic health conditions This visit occurred during the SARS-CoV-2 public health emergency.  Safety protocols were in place, including screening questions prior to the visit, additional usage of staff PPE, and extensive cleaning of exam room while observing appropriate contact time as indicated for disinfecting solutions.   Reviewed advanced directives Reviewed other doctors-------Dr Gollan---cardiology, Dr Morrison Old, Dr Poggi--orthopedist No alcohol lately ---wine occasionally in the past No tobacco Did have fall and injury Does have some depressed mood--not every day. Tough with the hip fracture and limitations. Some degree of anhedonia. Vision is okay. Cataract removed in the past year Hearing is "great" Some memory issues--but not consistently No exercise---disussed  Had fracture of right hip---in May. This was repaired Then right ankle fracture Done with therapy Walks with rollator--but often finds herself going around [place] without it Not driving---"I don't feel safe" Aide twice a week---makes bed, housework, etc Gets meals from [facility] frequently---then simple cooking Now on the vitamin D and alendronate  Having some heart issues--awoke with palpitations No SOB Went away in 15 minutes or so (went back to sleep) No dizziness or syncope Slight edema only Has not been regularly taking the amlodipine  No leg pain No foot ulcers Had AAA stent in past  No regular cough No SOB --but fairly sedentary  No Rx for CLL at this point Would consider it if worsened  Current Outpatient Medications on File Prior to Visit  Medication Sig Dispense Refill  . acetaminophen (TYLENOL) 325 MG tablet Take 1-2 tablets (325-650 mg total) by mouth every 6 (six) hours as needed for mild pain (pain score 1-3 or temp > 100.5).      Marland Kitchen alendronate (FOSAMAX) 70 MG tablet Take 1 tablet (70 mg total) by mouth once a week. Take with a full glass of water on an empty stomach. 13 tablet 3  . atorvastatin (LIPITOR) 20 MG tablet TAKE 1 TABLET(20 MG) BY MOUTH DAILY 90 tablet 3  . cholecalciferol (VITAMIN D3) 25 MCG (1000 UNIT) tablet Take 1,000 Units by mouth daily.    Marland Kitchen loratadine (CLARITIN) 10 MG tablet Take 10 mg by mouth daily.    . Multiple Vitamins-Minerals (MULTIVITAMIN WITH MINERALS) tablet Take 1 tablet by mouth daily.    . potassium chloride SA (K-DUR) 20 MEQ tablet Take 1 tablet (20 mEq total) by mouth daily. 90 tablet 1   No current facility-administered medications on file prior to visit.    Allergies  Allergen Reactions  . Triamterene Hives    Past Medical History:  Diagnosis Date  . AAA (abdominal aortic aneurysm) ([place]) 2012   3.9cm, rec rpt 1 yr  . Anemia   . Anxiety   . CHF (congestive heart failure) ([place])   . Chronic venous insufficiency   . CLL (chronic lymphocytic leukemia) ([place]) 2014   Stage 0--by flow cytometry  . Hx of colonoscopy   . Hyperlipidemia   . Hypertension   . Melanoma ([place])    Right Arm  . Migraine headache    3-4X/yr  . PAD (peripheral artery disease) ([place]) 2012   severe R common iliac stenosis, ABI 0.73, mid L common iliac stenosis ABI 1.1  . PAD (peripheral artery disease) ([place])   . Wears dentures    full upper and lower    Past Surgical History:  Procedure Laterality Date  .  ABDOMINAL AORTIC ANEURYSM REPAIR  02/16/12   [facility]--Dr Sammuel Hines  . CARDIOVASCULAR STRESS TEST  2008   negative  . CATARACT EXTRACTION W/PHACO Left 07/16/2019   Procedure: CATARACT EXTRACTION PHACO AND INTRAOCULAR LENS PLACEMENT (IOC) LEFT 8.26  00:46.6;  Surgeon: Eulogio Bear, MD;  Location: [place];  Service: Ophthalmology;  Laterality: Left;  . DOBUTAMINE STRESS ECHO  11/11   negative  . HIP ARTHROPLASTY Right 10/10/2019   Procedure: ARTHROPLASTY BIPOLAR HIP (HEMIARTHROPLASTY);   Surgeon: Corky Mull, MD;  Location: [facility];  Service: Orthopedics;  Laterality: Right;  . KNEE ARTHROSCOPY  1998   right  . MELANOMA EXCISION  11/09/10   wide excision right upper arm  . SQUAMOUS CELL CARCINOMA EXCISION  02/2016  . TONSILLECTOMY AND ADENOIDECTOMY  1960    Family History  Problem Relation Age of Onset  . Alzheimer's disease Mother   . Pancreatic cancer Father   . Parkinson's disease Brother   . Coronary artery disease Neg Hx   . Diabetes Neg Hx   . Cancer Neg Hx        breast or colon    Social History   Socioeconomic History  . Marital status: Widowed    Spouse name: Not on file  . Number of children: 4  . Years of education: Not on file  . Highest education level: Not on file  Occupational History  . Occupation: retiredCommunity education officer,   Tobacco Use  . Smoking status: Light Tobacco Smoker    Types: Cigarettes    Last attempt to quit: 08/15/2017    Years since quitting: 2.5  . Smokeless tobacco: Never Used  . Tobacco comment: occassionally  Vaping Use  . Vaping Use: Never used  Substance and Sexual Activity  . Alcohol use: Yes    Alcohol/week: 7.0 standard drinks    Types: 7 Glasses of wine per week    Comment: regular wine with dinner  . Drug use: No  . Sexual activity: Never  Other Topics Concern  . Not on file  Social History Narrative   Widowed 2/11 -2nd for him , 1st for her. 4 step sons. 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 Difficulty of Paying Living Expenses: Not on file  Food Insecurity:   . Worried About Charity fundraiser in the Last Year: Not on file  . Ran Out of Food in the Last Year: Not on file  Transportation Needs:   . Lack  of Transportation (Medical): Not on file  . Lack of Transportation (Non-Medical): Not on file  Physical Activity:   . Days of Exercise per Week: Not on file  . Minutes of Exercise per Session: Not on file  Stress:   . Feeling of Stress : Not on file  Social Connections:   . Frequency of Communication with Friends and Family: Not on file  . Frequency of Social Gatherings with Friends and Family: Not on file  . Attends Religious Services: Not on file  . Active Member of Clubs or Organizations: Not on file  . Attends Archivist Meetings: Not on  file  . Marital Status: Not on file  Intimate Partner Violence:   . Fear of Current or Ex-Partner: Not on file  . Emotionally Abused: Not on file  . Physically Abused: Not on file  . Sexually Abused: Not on file   Review of Systems Still not hungry  Lost a little more weight since my last visit Does use boost ---- 1-2 daily No headaches Sleeps well Doesn't like dark areas on forehead--?melasma. Does keep up with dentist Full dentures Wears seat belt No heartburn or dysphagia Bowels are fine--no blood Last GFR 32    Objective:   Physical Exam Constitutional:      Appearance: Normal appearance.  HENT:     Mouth/Throat:     Comments: No lesions Eyes:     Conjunctiva/sclera: Conjunctivae normal.     Pupils: Pupils are equal, round, and reactive to light.  Cardiovascular:     Rate and Rhythm: Normal rate and regular rhythm.     Heart sounds: No murmur heard.  No gallop.      Comments: Very faint pedal pulses Pulmonary:     Effort: Pulmonary effort is normal.     Breath sounds: No wheezing or rales.     Comments: Decreased breath sounds but clear Abdominal:     Palpations: Abdomen is soft.     Tenderness: There is no abdominal tenderness.  Musculoskeletal:     Comments: Very slight calf/ankle edema  Skin:    Findings: No rash.  Neurological:     Mental Status: She is alert and oriented to person, place, and time.      Comments: October--1992---then 2021 President-- "Wendie Simmer---- Obama" [phone] [id number] Recall 3/3  Psychiatric:        Mood and Affect: Mood normal.        Behavior: Behavior normal.            Assessment & Plan:

## 2020-02-21 NOTE — Assessment & Plan Note (Signed)
No fluid overload Will start low dose losartan

## 2020-02-21 NOTE — Assessment & Plan Note (Signed)
Had a hard time with fracture/limitations/rehab No MDD Will hold off on meds

## 2020-02-21 NOTE — Assessment & Plan Note (Signed)
No longer smokes Doing okay without meds---very sedentary now

## 2020-02-21 NOTE — Assessment & Plan Note (Signed)
Will recheck labs To oncology if worsens

## 2020-02-21 NOTE — Assessment & Plan Note (Signed)
Will try losartan Stop potassium Recheck soon to monitor labs

## 2020-02-21 NOTE — Assessment & Plan Note (Signed)
Has DNR 

## 2020-02-21 NOTE — Assessment & Plan Note (Signed)
BP Readings from Last 3 Encounters:  02/21/20 (!) 162/66  02/04/20 (!) 178/64  11/29/19 (!) 144/62   Uncontrolled Don't want the amlodipine due to the edema Will start losartan given CKD and HTN

## 2020-02-21 NOTE — Assessment & Plan Note (Signed)
I have personally reviewed the Medicare Annual Wellness questionnaire and have noted 1. The patient's medical and social history 2. Their use of alcohol, tobacco or illicit drugs 3. Their current medications and supplements 4. The patient's functional ability including ADL's, fall risks, home safety risks and hearing or visual             impairment. 5. Diet and physical activities 6. Evidence for depression or mood disorders  The patients weight, height, BMI and visual acuity have been recorded in the chart I have made referrals, counseling and provided education to the patient based review of the above and I have provided the pt with a written personalized care plan for preventive services.  I have provided you with a copy of your personalized plan for preventive services. Please take the time to review along with your updated medication list.  No cancer screening due to age Flu vaccine today COVID booster when available Discussed chair exercises to strengthen legs

## 2020-02-22 LAB — RENAL FUNCTION PANEL
Albumin: 3.9 g/dL (ref 3.5–5.2)
BUN: 35 mg/dL — ABNORMAL HIGH (ref 6–23)
CO2: 25 mEq/L (ref 19–32)
Calcium: 9.6 mg/dL (ref 8.4–10.5)
Chloride: 105 mEq/L (ref 96–112)
Creatinine, Ser: 1.94 mg/dL — ABNORMAL HIGH (ref 0.40–1.20)
GFR: 23.2 mL/min — ABNORMAL LOW (ref >60.00)
Glucose, Bld: 96 mg/dL (ref 70–99)
Phosphorus: 4 mg/dL (ref 2.3–4.6)
Potassium: 4.1 mEq/L (ref 3.5–5.1)
Sodium: 137 mEq/L (ref 135–145)

## 2020-02-22 LAB — CBC
HCT: 30.1 % — ABNORMAL LOW (ref 36.0–46.0)
Hemoglobin: 9.9 g/dL — ABNORMAL LOW (ref 12.0–15.0)
MCHC: 33 g/dL (ref 30.0–36.0)
MCV: 102.5 fl — ABNORMAL HIGH (ref 78.0–100.0)
Platelets: 147 10*3/uL — ABNORMAL LOW (ref 150.0–400.0)
RBC: 2.93 Mil/uL — ABNORMAL LOW (ref 3.87–5.11)
RDW: 18.2 % — ABNORMAL HIGH (ref 11.5–15.5)
WBC: 14.2 10*3/uL — ABNORMAL HIGH (ref 4.0–10.5)

## 2020-02-22 LAB — LIPID PANEL
Cholesterol: 164 mg/dL (ref 0–200)
HDL: 34.4 mg/dL — ABNORMAL LOW (ref >39.00)
LDL Cholesterol: 91 mg/dL (ref 0–99)
NonHDL: 129.37
Total CHOL/HDL Ratio: 5
Triglycerides: 192 mg/dL — ABNORMAL HIGH (ref 0.0–149.0)
VLDL: 38.4 mg/dL (ref 0.0–40.0)

## 2020-02-22 LAB — HEPATIC FUNCTION PANEL
ALT: 13 U/L (ref 0–35)
AST: 16 U/L (ref 0–37)
Albumin: 3.9 g/dL (ref 3.5–5.2)
Alkaline Phosphatase: 55 U/L (ref 39–117)
Bilirubin, Direct: 0.1 mg/dL (ref 0.0–0.3)
Total Bilirubin: 0.5 mg/dL (ref 0.2–1.2)
Total Protein: 6.5 g/dL (ref 6.0–8.3)

## 2020-02-24 ENCOUNTER — Other Ambulatory Visit: Payer: Self-pay | Admitting: Internal Medicine

## 2020-02-24 DIAGNOSIS — N184 Chronic kidney disease, stage 4 (severe): Secondary | ICD-10-CM

## 2020-03-06 ENCOUNTER — Other Ambulatory Visit (INDEPENDENT_AMBULATORY_CARE_PROVIDER_SITE_OTHER): Payer: Medicare Other

## 2020-03-06 ENCOUNTER — Other Ambulatory Visit: Payer: Self-pay

## 2020-03-06 DIAGNOSIS — N184 Chronic kidney disease, stage 4 (severe): Secondary | ICD-10-CM

## 2020-03-06 LAB — RENAL FUNCTION PANEL
Albumin: 3.8 g/dL (ref 3.5–5.2)
BUN: 26 mg/dL — ABNORMAL HIGH (ref 6–23)
CO2: 26 mEq/L (ref 19–32)
Calcium: 9.6 mg/dL (ref 8.4–10.5)
Chloride: 107 mEq/L (ref 96–112)
Creatinine, Ser: 1.65 mg/dL — ABNORMAL HIGH (ref 0.40–1.20)
GFR: 28.47 mL/min — ABNORMAL LOW (ref >60.00)
Glucose, Bld: 82 mg/dL (ref 70–99)
Phosphorus: 3.4 mg/dL (ref 2.3–4.6)
Potassium: 3.9 mEq/L (ref 3.5–5.1)
Sodium: 139 mEq/L (ref 135–145)

## 2020-03-28 ENCOUNTER — Encounter: Payer: Self-pay | Admitting: Internal Medicine

## 2020-03-28 ENCOUNTER — Other Ambulatory Visit: Payer: Self-pay

## 2020-03-28 ENCOUNTER — Ambulatory Visit (INDEPENDENT_AMBULATORY_CARE_PROVIDER_SITE_OTHER): Payer: Medicare Other | Admitting: Internal Medicine

## 2020-03-28 VITALS — BP 140/68 | HR 55 | Temp 97.3°F | Ht 65.0 in | Wt 136.0 lb

## 2020-03-28 DIAGNOSIS — I10 Essential (primary) hypertension: Secondary | ICD-10-CM

## 2020-03-28 DIAGNOSIS — N1832 Chronic kidney disease, stage 3b: Secondary | ICD-10-CM | POA: Diagnosis not present

## 2020-03-28 DIAGNOSIS — D696 Thrombocytopenia, unspecified: Secondary | ICD-10-CM | POA: Insufficient documentation

## 2020-03-28 LAB — RENAL FUNCTION PANEL
Albumin: 4.1 g/dL (ref 3.5–5.2)
BUN: 29 mg/dL — ABNORMAL HIGH (ref 6–23)
CO2: 26 mEq/L (ref 19–32)
Calcium: 9.6 mg/dL (ref 8.4–10.5)
Chloride: 107 mEq/L (ref 96–112)
Creatinine, Ser: 1.63 mg/dL — ABNORMAL HIGH (ref 0.40–1.20)
GFR: 28.87 mL/min — ABNORMAL LOW (ref >60.00)
Glucose, Bld: 101 mg/dL — ABNORMAL HIGH (ref 70–99)
Phosphorus: 3.6 mg/dL (ref 2.3–4.6)
Potassium: 4 mEq/L (ref 3.5–5.1)
Sodium: 139 mEq/L (ref 135–145)

## 2020-03-28 NOTE — Progress Notes (Signed)
Subjective:    Patient ID: Tiffany Martinez, female    DOB: July 02, 1933, 86 y.o.   MRN: [id number]  HPI Here for follow up after starting losartan This visit occurred during the SARS-CoV-2 public health emergency.  Safety protocols were in place, including screening questions prior to the visit, additional usage of staff PPE, and extensive cleaning of exam room while observing appropriate contact time as indicated for disinfecting solutions.   Is taking the new medication --losartan Feels she was sick a few times from it Had some vomiting and wondered if that was it. Stopped it and improved Restarted and no further problems No chest pain or SOB Some dizziness---needs to use the rollator (though she wishes she could do without it)  She asks to review her CLL history Anemia goes back decades Has mild reduced platelets 147K Always bruises easily  Current Outpatient Medications on File Prior to Visit  Medication Sig Dispense Refill  . acetaminophen (TYLENOL) 325 MG tablet Take 1-2 tablets (325-650 mg total) by mouth every 6 (six) hours as needed for mild pain (pain score 1-3 or temp > 100.5).    Marland Kitchen alendronate (FOSAMAX) 70 MG tablet Take 1 tablet (70 mg total) by mouth once a week. Take with a full glass of water on an empty stomach. 13 tablet 3  . atorvastatin (LIPITOR) 20 MG tablet TAKE 1 TABLET(20 MG) BY MOUTH DAILY 90 tablet 3  . cholecalciferol (VITAMIN D3) 25 MCG (1000 UNIT) tablet Take 1,000 Units by mouth daily.    Marland Kitchen loratadine (CLARITIN) 10 MG tablet Take 10 mg by mouth daily.    Marland Kitchen losartan (COZAAR) 25 MG tablet Take 1 tablet (25 mg total) by mouth daily. 90 tablet 3  . Multiple Vitamins-Minerals (MULTIVITAMIN WITH MINERALS) tablet Take 1 tablet by mouth daily.     No current facility-administered medications on file prior to visit.    Allergies  Allergen Reactions  . Triamterene Hives    Past Medical History:  Diagnosis Date  . AAA (abdominal aortic aneurysm) ([place]) 2012     3.9cm, rec rpt 1 yr  . Anemia   . Anxiety   . CHF (congestive heart failure) ([place])   . Chronic venous insufficiency   . CLL (chronic lymphocytic leukemia) ([place]) 2014   Stage 0--by flow cytometry  . Hx of colonoscopy   . Hyperlipidemia   . Hypertension   . Melanoma ([place])    Right Arm  . Migraine headache    3-4X/yr  . PAD (peripheral artery disease) ([place]) 2012   severe R common iliac stenosis, ABI 0.73, mid L common iliac stenosis ABI 1.1  . PAD (peripheral artery disease) ([place])   . Wears dentures    full upper and lower    Past Surgical History:  Procedure Laterality Date  . ABDOMINAL AORTIC ANEURYSM REPAIR  02/16/12   [facility]--Dr Sammuel Hines  . CARDIOVASCULAR STRESS TEST  2008   negative  . CATARACT EXTRACTION W/PHACO Left 07/16/2019   Procedure: CATARACT EXTRACTION PHACO AND INTRAOCULAR LENS PLACEMENT (IOC) LEFT 8.26  00:46.6;  Surgeon: Eulogio Bear, MD;  Location: [place];  Service: Ophthalmology;  Laterality: Left;  . DOBUTAMINE STRESS ECHO  11/11   negative  . HIP ARTHROPLASTY Right 10/10/2019   Procedure: ARTHROPLASTY BIPOLAR HIP (HEMIARTHROPLASTY);  Surgeon: Corky Mull, MD;  Location: [facility];  Service: Orthopedics;  Laterality: Right;  . KNEE ARTHROSCOPY  1998   right  . MELANOMA EXCISION  11/09/10   wide excision right upper  arm  . SQUAMOUS CELL CARCINOMA EXCISION  02/2016  . TONSILLECTOMY AND ADENOIDECTOMY  1960    Family History  Problem Relation Age of Onset  . Alzheimer's disease Mother   . Pancreatic cancer Father   . Parkinson's disease Brother   . Coronary artery disease Neg Hx   . Diabetes Neg Hx   . Cancer Neg Hx        breast or colon    Social History   Socioeconomic History  . Marital status: Widowed    Spouse name: Not on file  . Number of children: 4  . Years of education: Not on file  . Highest education level: Not on file  Occupational History  . Occupation: retiredCommunity education officer,   Tobacco Use  . Smoking status: Light Tobacco  Smoker    Types: Cigarettes    Last attempt to quit: 08/15/2017    Years since quitting: 2.6  . Smokeless tobacco: Never Used  . Tobacco comment: occassionally  Vaping Use  . Vaping Use: Never used  Substance and Sexual Activity  . Alcohol use: Yes    Alcohol/week: 7.0 standard drinks    Types: 7 Glasses of wine per week    Comment: regular wine with dinner  . Drug use: No  . Sexual activity: Never  Other Topics Concern  . Not on file  Social History Narrative   Widowed 2/11 -2nd for him , 1st for her. 4 step sons. 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 Difficulty of Paying Living Expenses: Not on file  Food Insecurity:   . Worried About Charity fundraiser in the Last Year: Not on file  . Ran Out of Food in the Last Year: Not on file  Transportation Needs:   . Lack of Transportation (Medical): Not on file  . Lack of Transportation (Non-Medical): Not on file  Physical Activity:   . Days of Exercise per Week: Not on file  . Minutes of Exercise per Session: Not on file  Stress:   . Feeling of Stress : Not on file  Social Connections:   . Frequency of Communication with Friends and Family: Not on file  . Frequency of Social Gatherings with Friends and Family: Not on file  . Attends Religious Services: Not on file  . Active Member of Clubs or Organizations: Not on file  . Attends Archivist Meetings: Not on file  . Marital Status: Not on file  Intimate Partner Violence:   . Fear of Current or Ex-Partner: Not on file  . Emotionally Abused: Not on file  . Physically Abused: Not on file  . Sexually Abused: Not on file   Review of Systems  Not eating great---just  not hungry Weight is stable though Does drink boost regularly     Objective:   Physical Exam Cardiovascular:     Rate and Rhythm: Normal rate and regular rhythm.     Heart sounds: No murmur heard.  No gallop.   Pulmonary:     Effort: Pulmonary effort is normal.     Breath sounds: Normal breath sounds. No  wheezing or rales.  Musculoskeletal:     Cervical back: Neck supple.     Comments: Trace ankle edema  Lymphadenopathy:     Cervical: No cervical adenopathy.  Neurological:     Mental Status: She is alert.  Psychiatric:        Mood and Affect: Mood normal.        Behavior: Behavior normal.            Assessment & Plan:

## 2020-03-28 NOTE — Assessment & Plan Note (Signed)
Borderline Probably related to CLL ---as well as persistent anemia No action for now

## 2020-03-28 NOTE — Assessment & Plan Note (Signed)
Will recheck on the low dose losartan I would expect GFR may go down slightly

## 2020-03-28 NOTE — Assessment & Plan Note (Signed)
BP Readings from Last 3 Encounters:  03/28/20 140/68  02/21/20 (!) 162/66  02/04/20 (!) 178/64   Better on the low dose losartan

## 2020-08-04 ENCOUNTER — Ambulatory Visit: Payer: Medicare Other | Admitting: Cardiovascular Disease

## 2020-10-02 ENCOUNTER — Ambulatory Visit: Payer: Medicare Other | Admitting: Internal Medicine

## 2020-10-17 ENCOUNTER — Other Ambulatory Visit: Payer: Self-pay

## 2020-10-17 ENCOUNTER — Ambulatory Visit (INDEPENDENT_AMBULATORY_CARE_PROVIDER_SITE_OTHER): Payer: Medicare Other | Admitting: Internal Medicine

## 2020-10-17 ENCOUNTER — Encounter: Payer: Self-pay | Admitting: Internal Medicine

## 2020-10-17 VITALS — BP 140/84 | HR 60 | Temp 97.2°F | Ht 64.5 in | Wt 141.0 lb

## 2020-10-17 DIAGNOSIS — J449 Chronic obstructive pulmonary disease, unspecified: Secondary | ICD-10-CM | POA: Diagnosis not present

## 2020-10-17 DIAGNOSIS — N184 Chronic kidney disease, stage 4 (severe): Secondary | ICD-10-CM

## 2020-10-17 DIAGNOSIS — F39 Unspecified mood [affective] disorder: Secondary | ICD-10-CM

## 2020-10-17 DIAGNOSIS — I5032 Chronic diastolic (congestive) heart failure: Secondary | ICD-10-CM

## 2020-10-17 LAB — CBC
HCT: 32.5 % — ABNORMAL LOW (ref 36.0–46.0)
Hemoglobin: 10.6 g/dL — ABNORMAL LOW (ref 12.0–15.0)
MCHC: 32.7 g/dL (ref 30.0–36.0)
MCV: 103 fl — ABNORMAL HIGH (ref 78.0–100.0)
Platelets: 114 10*3/uL — ABNORMAL LOW (ref 150.0–400.0)
RBC: 3.16 Mil/uL — ABNORMAL LOW (ref 3.87–5.11)
RDW: 19 % — ABNORMAL HIGH (ref 11.5–15.5)
WBC: 16.8 10*3/uL — ABNORMAL HIGH (ref 4.0–10.5)

## 2020-10-17 LAB — T4, FREE: Free T4: 0.92 ng/dL (ref 0.60–1.60)

## 2020-10-17 NOTE — Assessment & Plan Note (Signed)
Will check labs off the losartan

## 2020-10-17 NOTE — Assessment & Plan Note (Signed)
Some DOE but stable No longer smoking

## 2020-10-17 NOTE — Assessment & Plan Note (Signed)
Not exacerbated despite being off the losartan Will hold off on meds Functional decline ---good idea to move to assisted living

## 2020-10-17 NOTE — Progress Notes (Signed)
Subjective:    Patient ID: Tiffany Martinez, female    DOB: 1933-12-21, 87 y.o.   MRN: [id number]  HPI Here to discuss possible move to assisted living This visit occurred during the SARS-CoV-2 public health emergency.  Safety protocols were in place, including screening questions prior to the visit, additional usage of staff PPE, and extensive cleaning of exam room while observing appropriate contact time as indicated for disinfecting solutions.   Considering moving to [place] She stopped all her medications--feels better with out them Stopped like 6 months ago Feels tired but overall feels good  No breathing No chest pain Slight ankle edema--better with elevation Sleeps on 1 pillow---no PND  Falls a lot Uses rollator--occasionally gets her feet caught in the wheels  Current Outpatient Medications on File Prior to Visit  Medication Sig Dispense Refill  . loratadine (CLARITIN) 10 MG tablet Take 10 mg by mouth daily.     No current facility-administered medications on file prior to visit.    Allergies  Allergen Reactions  . Triamterene Hives    Past Medical History:  Diagnosis Date  . AAA (abdominal aortic aneurysm) ([place]) 2012   3.9cm, rec rpt 1 yr  . Anemia   . Anxiety   . CHF (congestive heart failure) ([place])   . Chronic venous insufficiency   . CLL (chronic lymphocytic leukemia) ([place]) 2014   Stage 0--by flow cytometry  . Hx of colonoscopy   . Hyperlipidemia   . Hypertension   . Melanoma ([place])    Right Arm  . Migraine headache    3-4X/yr  . PAD (peripheral artery disease) ([place]) 2012   severe R common iliac stenosis, ABI 0.73, mid L common iliac stenosis ABI 1.1  . PAD (peripheral artery disease) ([place])   . Wears dentures    full upper and lower    Past Surgical History:  Procedure Laterality Date  . ABDOMINAL AORTIC ANEURYSM REPAIR  02/16/12   [facility]--Dr Sammuel Hines  . CARDIOVASCULAR STRESS TEST  2008   negative  . CATARACT EXTRACTION W/PHACO Left 07/16/2019    Procedure: CATARACT EXTRACTION PHACO AND INTRAOCULAR LENS PLACEMENT (IOC) LEFT 8.26  00:46.6;  Surgeon: Eulogio Bear, MD;  Location: [place];  Service: Ophthalmology;  Laterality: Left;  . DOBUTAMINE STRESS ECHO  11/11   negative  . HIP ARTHROPLASTY Right 10/10/2019   Procedure: ARTHROPLASTY BIPOLAR HIP (HEMIARTHROPLASTY);  Surgeon: Corky Mull, MD;  Location: [facility];  Service: Orthopedics;  Laterality: Right;  . KNEE ARTHROSCOPY  1998   right  . MELANOMA EXCISION  11/09/10   wide excision right upper arm  . SQUAMOUS CELL CARCINOMA EXCISION  02/2016  . TONSILLECTOMY AND ADENOIDECTOMY  1960    Family History  Problem Relation Age of Onset  . Alzheimer's disease Mother   . Pancreatic cancer Father   . Parkinson's disease Brother   . Coronary artery disease Neg Hx   . Diabetes Neg Hx   . Cancer Neg Hx        breast or colon    Social History   Socioeconomic History  . Marital status: Widowed    Spouse name: Not on file  . Number of children: 4  . Years of education: Not on file  . Highest education level: Not on file  Occupational History  . Occupation: retiredCommunity education officer,   Tobacco Use  . Smoking status: Light Tobacco Smoker    Types: Cigarettes    Last attempt to quit: 08/15/2017  Years since quitting: 3.1  . Smokeless tobacco: Never Used  . Tobacco comment: occassionally  Vaping Use  . Vaping Use: Never used  Substance and Sexual Activity  . Alcohol use: Yes    Alcohol/week: 7.0 standard drinks    Types: 7 Glasses of wine per week    Comment: regular wine with dinner  . Drug use: No  . Sexual activity: Never  Other Topics Concern  . Not on file  Social History Narrative   Widowed 2/11 -2nd for him , 1st for her. 4 step sons. 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         Has living will and health care POA--changing her POA at this point   Requests DNR   Discussed MOST form---but would probably accept IV  fluids/antibiotics, etc   Requests no feeding tube         Social Determinants of Health   Financial Resource Strain: Not on file  Food Insecurity: Not on file  Transportation Needs: Not on file  Physical Activity: Not on file  Stress: Not on file  Social Connections: Not on file  Intimate Partner Violence: Not on file   Review of Systems Trouble initiating sleep-finally by 1AM and sleeps till 7 No overly problematic daytime somnolence---but does nap in afternoon (1-2 hours) Gets ready made meals from the [employer]---does okay with them Weight is stable Some worsening memory problems--no functional issues    Objective:   Physical Exam Constitutional:      Appearance: Normal appearance.  Cardiovascular:     Rate and Rhythm: Normal rate and regular rhythm.     Heart sounds: No murmur heard. No gallop.   Pulmonary:     Effort: Pulmonary effort is normal.     Breath sounds: Normal breath sounds. No wheezing or rales.  Musculoskeletal:     Cervical back: Neck supple.     Comments: Trace ankle edema  Lymphadenopathy:     Cervical: No cervical adenopathy.  Skin:    Comments: Scabbed area on occiput and preauricular on left (black dried blood)  Neurological:     Mental Status: She is alert.  Psychiatric:        Mood and Affect: Mood normal.        Behavior: Behavior normal.            Assessment & Plan:

## 2020-10-17 NOTE — Assessment & Plan Note (Signed)
Now seems to be lonely May benefit from social interaction at assisted living

## 2020-10-20 LAB — RENAL FUNCTION PANEL
Albumin: 3.7 g/dL (ref 3.5–5.2)
BUN: 40 mg/dL — ABNORMAL HIGH (ref 6–23)
CO2: 19 mEq/L (ref 19–32)
Calcium: 8.8 mg/dL (ref 8.4–10.5)
Chloride: 103 mEq/L (ref 96–112)
Creatinine, Ser: 1.74 mg/dL — ABNORMAL HIGH (ref 0.40–1.20)
GFR: 26.59 mL/min — ABNORMAL LOW (ref >60.00)
Glucose, Bld: 104 mg/dL — ABNORMAL HIGH (ref 70–99)
Phosphorus: 4.2 mg/dL (ref 2.3–4.6)
Potassium: 4.1 mEq/L (ref 3.5–5.1)
Sodium: 134 mEq/L — ABNORMAL LOW (ref 135–145)

## 2021-01-08 DIAGNOSIS — D0439 Carcinoma in situ of skin of other parts of face: Secondary | ICD-10-CM | POA: Diagnosis not present

## 2021-01-08 DIAGNOSIS — D2262 Melanocytic nevi of left upper limb, including shoulder: Secondary | ICD-10-CM | POA: Diagnosis not present

## 2021-01-08 DIAGNOSIS — X32XXXA Exposure to sunlight, initial encounter: Secondary | ICD-10-CM | POA: Diagnosis not present

## 2021-01-08 DIAGNOSIS — D2261 Melanocytic nevi of right upper limb, including shoulder: Secondary | ICD-10-CM | POA: Diagnosis not present

## 2021-01-08 DIAGNOSIS — D2272 Melanocytic nevi of left lower limb, including hip: Secondary | ICD-10-CM | POA: Diagnosis not present

## 2021-01-08 DIAGNOSIS — L570 Actinic keratosis: Secondary | ICD-10-CM | POA: Diagnosis not present

## 2021-01-08 DIAGNOSIS — D225 Melanocytic nevi of trunk: Secondary | ICD-10-CM | POA: Diagnosis not present

## 2021-01-08 DIAGNOSIS — Z85828 Personal history of other malignant neoplasm of skin: Secondary | ICD-10-CM | POA: Diagnosis not present

## 2021-01-08 DIAGNOSIS — D485 Neoplasm of uncertain behavior of skin: Secondary | ICD-10-CM | POA: Diagnosis not present

## 2021-01-08 DIAGNOSIS — Z85820 Personal history of malignant melanoma of skin: Secondary | ICD-10-CM | POA: Diagnosis not present

## 2021-01-26 DIAGNOSIS — H2513 Age-related nuclear cataract, bilateral: Secondary | ICD-10-CM | POA: Diagnosis not present

## 2021-03-11 ENCOUNTER — Encounter: Payer: Self-pay | Admitting: Emergency Medicine

## 2021-03-11 ENCOUNTER — Emergency Department: Payer: Medicare Other

## 2021-03-11 ENCOUNTER — Emergency Department
Admission: EM | Admit: 2021-03-11 | Discharge: 2021-03-11 | Disposition: A | Discharge status: Home / Self Care | Payer: Medicare Other | Attending: Emergency Medicine | Admitting: Emergency Medicine

## 2021-03-11 ENCOUNTER — Other Ambulatory Visit: Payer: Self-pay

## 2021-03-11 DIAGNOSIS — N184 Chronic kidney disease, stage 4 (severe): Secondary | ICD-10-CM | POA: Insufficient documentation

## 2021-03-11 DIAGNOSIS — S299XXA Unspecified injury of thorax, initial encounter: Secondary | ICD-10-CM | POA: Diagnosis present

## 2021-03-11 DIAGNOSIS — I5032 Chronic diastolic (congestive) heart failure: Secondary | ICD-10-CM | POA: Diagnosis not present

## 2021-03-11 DIAGNOSIS — R531 Weakness: Secondary | ICD-10-CM | POA: Insufficient documentation

## 2021-03-11 DIAGNOSIS — X58XXXA Exposure to other specified factors, initial encounter: Secondary | ICD-10-CM | POA: Diagnosis not present

## 2021-03-11 DIAGNOSIS — I10 Essential (primary) hypertension: Secondary | ICD-10-CM | POA: Diagnosis not present

## 2021-03-11 DIAGNOSIS — F17210 Nicotine dependence, cigarettes, uncomplicated: Secondary | ICD-10-CM | POA: Diagnosis not present

## 2021-03-11 DIAGNOSIS — I130 Hypertensive heart and chronic kidney disease with heart failure and stage 1 through stage 4 chronic kidney disease, or unspecified chronic kidney disease: Secondary | ICD-10-CM | POA: Insufficient documentation

## 2021-03-11 DIAGNOSIS — R0789 Other chest pain: Secondary | ICD-10-CM | POA: Diagnosis not present

## 2021-03-11 DIAGNOSIS — J449 Chronic obstructive pulmonary disease, unspecified: Secondary | ICD-10-CM | POA: Diagnosis not present

## 2021-03-11 DIAGNOSIS — Z79899 Other long term (current) drug therapy: Secondary | ICD-10-CM | POA: Diagnosis not present

## 2021-03-11 DIAGNOSIS — S29012A Strain of muscle and tendon of back wall of thorax, initial encounter: Secondary | ICD-10-CM | POA: Insufficient documentation

## 2021-03-11 DIAGNOSIS — R079 Chest pain, unspecified: Secondary | ICD-10-CM | POA: Diagnosis not present

## 2021-03-11 DIAGNOSIS — R0902 Hypoxemia: Secondary | ICD-10-CM | POA: Diagnosis not present

## 2021-03-11 DIAGNOSIS — S46812A Strain of other muscles, fascia and tendons at shoulder and upper arm level, left arm, initial encounter: Secondary | ICD-10-CM | POA: Diagnosis not present

## 2021-03-11 LAB — BASIC METABOLIC PANEL
Anion gap: 7 (ref 5–15)
BUN: 32 mg/dL — ABNORMAL HIGH (ref 8–23)
CO2: 24 mmol/L (ref 22–32)
Calcium: 10 mg/dL (ref 8.9–10.3)
Chloride: 107 mmol/L (ref 98–111)
Creatinine, Ser: 1.75 mg/dL — ABNORMAL HIGH (ref 0.44–1.00)
GFR, Estimated: 28 mL/min — ABNORMAL LOW (ref >60)
Glucose, Bld: 110 mg/dL — ABNORMAL HIGH (ref 70–99)
Potassium: 3.9 mmol/L (ref 3.5–5.1)
Sodium: 138 mmol/L (ref 135–145)

## 2021-03-11 LAB — CBC
HCT: 32.5 % — ABNORMAL LOW (ref 36.0–46.0)
Hemoglobin: 12.1 g/dL (ref 12.0–15.0)
MCH: 40.2 pg — ABNORMAL HIGH (ref 26.0–34.0)
MCHC: 37.2 g/dL — ABNORMAL HIGH (ref 30.0–36.0)
MCV: 108 fL — ABNORMAL HIGH (ref 80.0–100.0)
Platelets: 151 10*3/uL (ref 150–400)
RBC: 3.01 MIL/uL — ABNORMAL LOW (ref 3.87–5.11)
RDW: 16.9 % — ABNORMAL HIGH (ref 11.5–15.5)
WBC: 22.6 10*3/uL — ABNORMAL HIGH (ref 4.0–10.5)
nRBC: 0 % (ref 0.0–0.2)

## 2021-03-11 LAB — TROPONIN I (HIGH SENSITIVITY)
Troponin I (High Sensitivity): 69 ng/L — ABNORMAL HIGH (ref <18)
Troponin I (High Sensitivity): 59 ng/L — ABNORMAL HIGH (ref <18)

## 2021-03-11 MED ORDER — ACETAMINOPHEN 325 MG PO TABS: 650.0000 mg | ORAL_TABLET | Freq: Four times a day (QID) | ORAL | 0 refills | Status: DC | PRN

## 2021-03-11 MED ORDER — LIDOCAINE 5 % EX PTCH: 1.0000 | MEDICATED_PATCH | CUTANEOUS | 0 refills | Status: DC

## 2021-03-11 MED ORDER — LIDOCAINE 5 % EX PTCH
1.0000 | MEDICATED_PATCH | CUTANEOUS | Status: DC
  Administered 2021-03-11: 1 via TRANSDERMAL
  Filled 2021-03-11: qty 1

## 2021-03-11 NOTE — ED Triage Notes (Addendum)
Pt lives alone in [facility], ems reports generalized weakness x 1 week. Denies pain, denies n/v/d. Pt reported to ems hx of CHF but does not take medication for it. Walks with walker. Pt a&o x 4.

## 2021-03-11 NOTE — ED Triage Notes (Addendum)
C/O two day history with high blood pressure.  Also c/o intermittent pain to left shoulder and scapular area x 2 days. Unable to lay down on that side.  AAOx3.  Skin warm and dry. NAD

## 2021-03-11 NOTE — TOC Initial Note (Signed)
Transition of Care [facility]) - Initial/Assessment Note    Patient Details  Name: Tiffany Martinez MRN: [id number] Date of Birth: July 02, 1933  Transition of Care [facility]) CM/SW Contact:    Anselm Pancoast, RN Phone Number: 03/11/2021, 2:04 PM  Clinical Narrative:                 Received updated from [place] @ [phone] updating patient is from [place]. Reports patient has no family close by as patient is from [place]. Closest contact and Financial POA is Michela Pitcher [phone] or [phone]. If patient needs transportation back to [facility] please call [phone].         Patient Goals and CMS Choice        Expected Discharge Plan and Services                                                Prior Living Arrangements/Services                       Activities of Daily Living      Permission Sought/Granted                  Emotional Assessment              Admission diagnosis:  weakness Patient Active Problem List   Diagnosis Date Noted   Thrombocytopenia ([place]) 03/28/2020   Malnutrition of mild degree ([place]) 11/29/2019   Secondary hyperparathyroidism of renal origin ([place]) 06/30/2018   Insomnia 02/02/2018   COPD (chronic obstructive pulmonary disease) ([place]) 12/26/2017   Chronic renal disease, stage IV ([place]) 12/26/2017   Aortic atherosclerosis ([place]) 09/14/2017   CLL (chronic lymphocytic leukemia) ([place]) 09/14/2017   Chronic diastolic heart failure ([place]) 08/25/2017   Advanced directives, counseling/discussion 01/15/2014   AAA (abdominal aortic aneurysm)    Routine general medical examination at a health care facility 07/07/2012   History of melanoma 12/24/2011   Peripheral arterial disease ([place]) 12/21/2010   History of squamous cell carcinoma of skin 09/28/2010   Chronic anemia 04/02/2010   VENOUS INSUFFICIENCY, CHRONIC 04/02/2010   Hyperlipidemia 06/23/2009   Episodic mood disorder ([place]) 06/23/2009   Essential  hypertension, benign 06/23/2009   PCP:  Venia Carbon, MD Pharmacy:   [facility] [place], [place] - [place] [address] [place] [address] [place] [address] Phone: [phone] Fax: [phone]  [facility] [place], [place] - [address] [place] [address] Phone: [phone] Fax: [phone]  [facility] [place], [place] [address] [place] [address] Phone: [phone] Fax: [phone]     Social Determinants of Health (SDOH) Interventions    Readmission Risk Interventions No flowsheet data found.

## 2021-03-11 NOTE — ED Provider Notes (Signed)
[facility] Emergency Department Provider Note  ____________________________________________  Time seen: Approximately 2:15 PM  I have reviewed the triage vital signs and the nursing notes.   HISTORY  Chief Complaint Weakness    HPI Tiffany Martinez is a 87 y.o. female with a past history of AAA, CLL, CHF, hypertension, COPD who comes ED complaining of left superior scapular pain for the past 2 days.  Intermittent, worse with movement and laying down on the left side.  Better sitting upright.  Denies any anterior chest pain.  No shortness of breath diaphoresis or vomiting.  Nonradiating.  No exertional symptoms.  Not pleuritic.  Currently resolved.  Patient notes that recently she has been packing her belongings into boxes and moving the heavy boxes around due to maintenance work being done at her residence.  Past Medical History:  Diagnosis Date   AAA (abdominal aortic aneurysm) 2012   3.9cm, rec rpt 1 yr   Anemia    Anxiety    CHF (congestive heart failure) (HCC)    Chronic venous insufficiency    CLL (chronic lymphocytic leukemia) ([place]) 2014   Stage 0--by flow cytometry   Hx of colonoscopy    Hyperlipidemia    Hypertension    Melanoma ([place])    Right Arm   Migraine headache    3-4X/yr   PAD (peripheral artery disease) ([place]) 2012   severe R common iliac stenosis, ABI 0.73, mid L common iliac stenosis ABI 1.1   PAD (peripheral artery disease) ([place])    Wears dentures    full upper and lower     Patient Active Problem List   Diagnosis Date Noted   Thrombocytopenia ([place]) 03/28/2020   Malnutrition of mild degree ([place]) 11/29/2019   Secondary hyperparathyroidism of renal origin ([place]) 06/30/2018   Insomnia 02/02/2018   COPD (chronic obstructive pulmonary disease) ([place]) 12/26/2017   Chronic renal disease, stage IV ([place]) 12/26/2017   Aortic atherosclerosis ([place]) 09/14/2017   CLL (chronic lymphocytic leukemia) ([place]) 09/14/2017   Chronic diastolic  heart failure ([place]) 08/25/2017   Advanced directives, counseling/discussion 01/15/2014   AAA (abdominal aortic aneurysm)    Routine general medical examination at a health care facility 07/07/2012   History of melanoma 12/24/2011   Peripheral arterial disease ([place]) 12/21/2010   History of squamous cell carcinoma of skin 09/28/2010   Chronic anemia 04/02/2010   VENOUS INSUFFICIENCY, CHRONIC 04/02/2010   Hyperlipidemia 06/23/2009   Episodic mood disorder ([place]) 06/23/2009   Essential hypertension, benign 06/23/2009     Past Surgical History:  Procedure Laterality Date   ABDOMINAL AORTIC ANEURYSM REPAIR  02/16/12   [facility]--Dr Sammuel Hines   CARDIOVASCULAR STRESS TEST  2008   negative   CATARACT EXTRACTION W/PHACO Left 07/16/2019   Procedure: CATARACT EXTRACTION PHACO AND INTRAOCULAR LENS PLACEMENT (IOC) LEFT 8.26  00:46.6;  Surgeon: Eulogio Bear, MD;  Location: [facility];  Service: Ophthalmology;  Laterality: Left;   DOBUTAMINE STRESS ECHO  11/11   negative   HIP ARTHROPLASTY Right 10/10/2019   Procedure: ARTHROPLASTY BIPOLAR HIP (HEMIARTHROPLASTY);  Surgeon: Corky Mull, MD;  Location: [facility];  Service: Orthopedics;  Laterality: Right;   KNEE ARTHROSCOPY  1998   right   MELANOMA EXCISION  11/09/10   wide excision right upper arm   SQUAMOUS CELL CARCINOMA EXCISION  02/2016   TONSILLECTOMY AND ADENOIDECTOMY  1960     Prior to Admission medications   Medication Sig Start Date End Date Taking? Authorizing Provider  acetaminophen (TYLENOL) 325 MG tablet  Take 2 tablets (650 mg total) by mouth every 6 (six) hours as needed. 03/11/21  Yes Carrie Mew, MD  lidocaine (LIDODERM) 5 % Place 1 patch onto the skin daily. Remove & Discard patch within 12 hours or as directed by MD 03/11/21  Yes Carrie Mew, MD  loratadine (CLARITIN) 10 MG tablet Take 10 mg by mouth daily.    [provider]     Allergies Triamterene   Family History  Problem Relation Age of  Onset   Alzheimer's disease Mother    Pancreatic cancer Father    Parkinson's disease Brother    Coronary artery disease Neg Hx    Diabetes Neg Hx    Cancer Neg Hx        breast or colon    Social History Social History   Tobacco Use   Smoking status: Light Smoker    Types: Cigarettes    Last attempt to quit: 08/15/2017    Years since quitting: 3.5   Smokeless tobacco: Never   Tobacco comments:    occassionally  Vaping Use   Vaping Use: Never used  Substance Use Topics   Alcohol use: Yes    Alcohol/week: 7.0 standard drinks    Types: 7 Glasses of wine per week    Comment: regular wine with dinner   Drug use: No    Review of Systems  Constitutional:   No fever or chills.  ENT:   No sore throat. No rhinorrhea. Cardiovascular:   No chest pain or syncope. Respiratory:   No dyspnea or cough. Gastrointestinal:   Negative for abdominal pain, vomiting and diarrhea.  Musculoskeletal:   Positive left upper back pain as above. All other systems reviewed and are negative except as documented above in ROS and HPI.  ____________________________________________   PHYSICAL EXAM:  VITAL SIGNS: ED Triage Vitals  Enc Vitals Group     BP 03/11/21 1050 137/81     Pulse Rate 03/11/21 1050 86     Resp 03/11/21 1050 18     Temp 03/11/21 1050 97.8 F (36.6 C)     Temp Source 03/11/21 1050 Oral     SpO2 03/11/21 1050 96 %     Weight 03/11/21 1049 141 lb 1.5 oz (64 kg)     Height 03/11/21 1049 5' 4.5" (1.638 m)     Head Circumference --      Peak Flow --      Pain Score 03/11/21 1049 0     Pain Loc --      Pain Edu? --      Excl. in [place]? --     Vital signs reviewed, nursing assessments reviewed.   Constitutional:   Alert and oriented. Non-toxic appearance. Eyes:   Conjunctivae are normal. EOMI. PERRL. ENT      Head:   Normocephalic and atraumatic.      Nose:   Wearing a mask.      Mouth/Throat:   Wearing a mask.      Neck:   No meningismus. Full  ROM. Hematological/Lymphatic/Immunilogical:   No cervical lymphadenopathy. Cardiovascular:   RRR. Symmetric bilateral radial and DP pulses.  No murmurs. Cap refill less than 2 seconds. Respiratory:   Normal respiratory effort without tachypnea/retractions. Breath sounds are clear and equal bilaterally. No wheezes/rales/rhonchi. Gastrointestinal:   Soft and nontender. Non distended. There is no CVA tenderness.  No rebound, rigidity, or guarding. Genitourinary:   deferred Musculoskeletal:   Normal range of motion in all extremities.  No shoulder  dislocation or arm tenderness.. no deformity.  There is focal tenderness over the trapezius just superior to the left scapula, exquisitely reproducing her pain.  There is no bony point tenderness. Neurologic:   Normal speech and language.  Motor grossly intact. No acute focal neurologic deficits are appreciated.  Skin:    Skin is warm, dry and intact. No rash noted.  No petechiae, purpura, or bullae.  ____________________________________________    LABS (pertinent positives/negatives) (all labs ordered are listed, but only abnormal results are displayed) Labs Reviewed  BASIC METABOLIC PANEL - Abnormal; Notable for the following components:      Result Value   Glucose, Bld 110 (*)    BUN 32 (*)    Creatinine, Ser 1.75 (*)    GFR, Estimated 28 (*)    All other components within normal limits  CBC - Abnormal; Notable for the following components:   WBC 22.6 (*)    RBC 3.01 (*)    HCT 32.5 (*)    MCV 108.0 (*)    MCH 40.2 (*)    MCHC 37.2 (*)    RDW 16.9 (*)    All other components within normal limits  TROPONIN I (HIGH SENSITIVITY) - Abnormal; Notable for the following components:   Troponin I (High Sensitivity) 69 (*)    All other components within normal limits  TROPONIN I (HIGH SENSITIVITY) - Abnormal; Notable for the following components:   Troponin I (High Sensitivity) 59 (*)    All other components within normal limits  URINALYSIS,  ROUTINE W REFLEX MICROSCOPIC  CBG MONITORING, ED   ____________________________________________   EKG  Interpreted by me Normal sinus rhythm rate of 83, normal axis and intervals.  Poor R wave progression.  LVH with repolarization abnormality.  No acute ischemic changes.  ____________________________________________    DDUKGURKY  DG Chest 2 View  Result Date: 03/11/2021 CLINICAL DATA:  Left upper shoulder pain. Additional provided: Generalized weakness. Patient reports history of CHF. EXAM: CHEST - 2 VIEW COMPARISON:  Prior chest radiographs 10/09/2019 and earlier. FINDINGS: Heart size within normal limits. Aortic atherosclerosis. No appreciable airspace consolidation or pulmonary edema. Minimal atelectasis versus scarring within the lateral right lung base. No evidence of pleural effusion or pneumothorax. No acute bony abnormality identified. Aortic stent graft within the partially imaged upper abdomen. IMPRESSION: Minimal atelectasis versus scarring within the lateral right lung base. Otherwise, no evidence of acute cardiopulmonary abnormality. Aortic Atherosclerosis (ICD10-I70.0). Electronically Signed   By: Kellie Simmering D.O.   On: 03/11/2021 13:46    ____________________________________________   PROCEDURES Procedures  ____________________________________________    CLINICAL IMPRESSION / ASSESSMENT AND PLAN / ED COURSE  Medications ordered in the ED: Medications  lidocaine (LIDODERM) 5 % 1 patch (1 patch Transdermal Patch Applied 03/11/21 1327)    Pertinent labs & imaging results that were available during my care of the patient were reviewed by me and considered in my medical decision making (see chart for details).  Conny Veney was evaluated in Emergency Department on 03/11/2021 for the symptoms described in the history of present illness. She was evaluated in the context of the global COVID-19 pandemic, which necessitated consideration that the patient might be at  risk for infection with the SARS-CoV-2 virus that causes COVID-19. Institutional protocols and algorithms that pertain to the evaluation of patients at risk for COVID-19 are in a state of rapid change based on information released by regulatory bodies including the CDC and federal and state organizations. These policies and algorithms were followed  during the patient's care in the ED.     Clinical Course as of 03/11/21 1415  Wed Mar 11, 2021  1345 Patient presents with left shoulder pain, reproducible on exam and appears to be due to trapezius injury.  Will place a lidocaine patch in this area.  Initial troponin is 69 which I think is chronic, will trend to ensure delta is flat.  Chest x-ray viewed and interpreted by me, unremarkable.  Anticipate discharge home with supportive care for musculoskeletal pain/muscle strain. [PS]    Clinical Course User Index [PS] Carrie Mew, MD    ----------------------------------------- 2:16 PM on 03/11/2021 ----------------------------------------- Serial troponins reassuring, likely representing chronic baseline.  Work-up is unremarkable, will treat supportively for left trapezius strain.   Considering the patient's symptoms, medical history, and physical examination today, I have low suspicion for ACS, PE, TAD, pneumothorax, carditis, mediastinitis, pneumonia, CHF, or sepsis.    ____________________________________________   FINAL CLINICAL IMPRESSION(S) / ED DIAGNOSES    Final diagnoses:  Trapezius strain, left, initial encounter     ED Discharge Orders          Ordered    acetaminophen (TYLENOL) 325 MG tablet  Every 6 hours PRN        03/11/21 1414    lidocaine (LIDODERM) 5 %  Every 24 hours        03/11/21 1414            Portions of this note were generated with dragon dictation software. Dictation errors may occur despite best attempts at proofreading.    Carrie Mew, MD 03/11/21 [id number]

## 2021-04-02 ENCOUNTER — Emergency Department: Payer: Medicare Other

## 2021-04-02 ENCOUNTER — Other Ambulatory Visit: Payer: Self-pay

## 2021-04-02 ENCOUNTER — Observation Stay
Admission: EM | Admit: 2021-04-02 | Discharge: 2021-04-04 | Disposition: A | Discharge status: Skilled Nursing Facility | Payer: Medicare Other | Attending: Internal Medicine | Admitting: Internal Medicine

## 2021-04-02 ENCOUNTER — Encounter: Payer: Self-pay | Admitting: Emergency Medicine

## 2021-04-02 DIAGNOSIS — S329XXA Fracture of unspecified parts of lumbosacral spine and pelvis, initial encounter for closed fracture: Secondary | ICD-10-CM

## 2021-04-02 DIAGNOSIS — Z20822 Contact with and (suspected) exposure to covid-19: Secondary | ICD-10-CM | POA: Diagnosis not present

## 2021-04-02 DIAGNOSIS — Z85828 Personal history of other malignant neoplasm of skin: Secondary | ICD-10-CM | POA: Insufficient documentation

## 2021-04-02 DIAGNOSIS — S32512A Fracture of superior rim of left pubis, initial encounter for closed fracture: Secondary | ICD-10-CM | POA: Diagnosis not present

## 2021-04-02 DIAGNOSIS — W010XXA Fall on same level from slipping, tripping and stumbling without subsequent striking against object, initial encounter: Secondary | ICD-10-CM | POA: Diagnosis not present

## 2021-04-02 DIAGNOSIS — W19XXXA Unspecified fall, initial encounter: Secondary | ICD-10-CM

## 2021-04-02 DIAGNOSIS — F17210 Nicotine dependence, cigarettes, uncomplicated: Secondary | ICD-10-CM | POA: Diagnosis not present

## 2021-04-02 DIAGNOSIS — N184 Chronic kidney disease, stage 4 (severe): Secondary | ICD-10-CM | POA: Insufficient documentation

## 2021-04-02 DIAGNOSIS — S3289XA Fracture of other parts of pelvis, initial encounter for closed fracture: Secondary | ICD-10-CM | POA: Insufficient documentation

## 2021-04-02 DIAGNOSIS — M25551 Pain in right hip: Secondary | ICD-10-CM

## 2021-04-02 DIAGNOSIS — C9110 Chronic lymphocytic leukemia of B-cell type not having achieved remission: Secondary | ICD-10-CM | POA: Diagnosis present

## 2021-04-02 DIAGNOSIS — Z79899 Other long term (current) drug therapy: Secondary | ICD-10-CM | POA: Insufficient documentation

## 2021-04-02 DIAGNOSIS — I10 Essential (primary) hypertension: Secondary | ICD-10-CM | POA: Diagnosis not present

## 2021-04-02 DIAGNOSIS — S32511A Fracture of superior rim of right pubis, initial encounter for closed fracture: Secondary | ICD-10-CM | POA: Diagnosis not present

## 2021-04-02 DIAGNOSIS — S32402A Unspecified fracture of left acetabulum, initial encounter for closed fracture: Secondary | ICD-10-CM | POA: Diagnosis not present

## 2021-04-02 DIAGNOSIS — J449 Chronic obstructive pulmonary disease, unspecified: Secondary | ICD-10-CM | POA: Diagnosis not present

## 2021-04-02 DIAGNOSIS — I739 Peripheral vascular disease, unspecified: Secondary | ICD-10-CM | POA: Diagnosis present

## 2021-04-02 DIAGNOSIS — M25559 Pain in unspecified hip: Secondary | ICD-10-CM | POA: Diagnosis present

## 2021-04-02 DIAGNOSIS — E785 Hyperlipidemia, unspecified: Secondary | ICD-10-CM | POA: Diagnosis present

## 2021-04-02 DIAGNOSIS — Z85820 Personal history of malignant melanoma of skin: Secondary | ICD-10-CM

## 2021-04-02 DIAGNOSIS — R0781 Pleurodynia: Secondary | ICD-10-CM | POA: Diagnosis not present

## 2021-04-02 DIAGNOSIS — R1900 Intra-abdominal and pelvic swelling, mass and lump, unspecified site: Secondary | ICD-10-CM | POA: Diagnosis not present

## 2021-04-02 DIAGNOSIS — R0902 Hypoxemia: Secondary | ICD-10-CM | POA: Diagnosis not present

## 2021-04-02 DIAGNOSIS — R52 Pain, unspecified: Secondary | ICD-10-CM | POA: Diagnosis not present

## 2021-04-02 DIAGNOSIS — I714 Abdominal aortic aneurysm, without rupture, unspecified: Secondary | ICD-10-CM | POA: Diagnosis present

## 2021-04-02 DIAGNOSIS — I5032 Chronic diastolic (congestive) heart failure: Secondary | ICD-10-CM | POA: Insufficient documentation

## 2021-04-02 DIAGNOSIS — I130 Hypertensive heart and chronic kidney disease with heart failure and stage 1 through stage 4 chronic kidney disease, or unspecified chronic kidney disease: Secondary | ICD-10-CM | POA: Insufficient documentation

## 2021-04-02 DIAGNOSIS — M25572 Pain in left ankle and joints of left foot: Secondary | ICD-10-CM | POA: Diagnosis not present

## 2021-04-02 DIAGNOSIS — S32110A Nondisplaced Zone I fracture of sacrum, initial encounter for closed fracture: Secondary | ICD-10-CM | POA: Diagnosis not present

## 2021-04-02 MED ORDER — MORPHINE SULFATE (PF) 2 MG/ML IV SOLN
0.5000 mg | INTRAVENOUS | Status: DC | PRN
  Administered 2021-04-03: 0.5 mg via INTRAVENOUS
  Filled 2021-04-02: qty 1

## 2021-04-02 MED ORDER — POLYETHYLENE GLYCOL 3350 17 G PO PACK
17.0000 g | PACK | Freq: Every day | ORAL | Status: DC
  Administered 2021-04-03: 17 g via ORAL
  Filled 2021-04-02 (×2): qty 1

## 2021-04-02 MED ORDER — NAPROXEN 500 MG PO TABS
500.0000 mg | ORAL_TABLET | Freq: Once | ORAL | Status: AC
  Administered 2021-04-02: 18:00:00 500 mg via ORAL
  Filled 2021-04-02: qty 1

## 2021-04-02 MED ORDER — ACETAMINOPHEN 500 MG PO TABS
1000.0000 mg | ORAL_TABLET | Freq: Once | ORAL | Status: AC
  Administered 2021-04-02: 18:00:00 1000 mg via ORAL
  Filled 2021-04-02: qty 2

## 2021-04-02 MED ORDER — OXYCODONE HCL 5 MG PO TABS
5.0000 mg | ORAL_TABLET | ORAL | Status: AC
  Administered 2021-04-03: 5 mg via ORAL
  Filled 2021-04-02: qty 1

## 2021-04-02 MED ORDER — ENOXAPARIN SODIUM 40 MG/0.4ML IJ SOSY: 40.0000 mg | PREFILLED_SYRINGE | INTRAMUSCULAR | Status: DC

## 2021-04-02 MED ORDER — HYDROCODONE-ACETAMINOPHEN 5-325 MG PO TABS
1.0000 | ORAL_TABLET | Freq: Four times a day (QID) | ORAL | Status: DC | PRN
  Administered 2021-04-03: 1 via ORAL
  Filled 2021-04-02: qty 1

## 2021-04-02 NOTE — ED Triage Notes (Signed)
Pt comes into the ED via [facility] from [place] c/o mechanical fall while taking out the trash.  Pt states as the day progressed she is no longer able to put weight on the right leg/hip.  No external rotation or shortening noted.  Pt also c/o right rib pain.  Denies hitting her head, denies any LOC.  Pt in NAD at this time with even and unlabored respirations.  180/70 70 HR 94% RA

## 2021-04-02 NOTE — H&P (Signed)
History and Physical   Tiffany Martinez PRF:[id number] DOB: 26-Sep-1933 DOA: 04/02/2021  PCP: Venia Carbon, MD  Outpatient Specialists: Dr. Rockey Situ, cardiology Patient coming from: [facility] via EMS  I have personally briefly reviewed patient's old medical records in [place].  Chief Concern: Right hip pain  HPI: Tiffany Martinez is a pleasant 87 y.o. female with medical history significant for hypertension, history of abdominal aortic aneurysm status postrepair at [facility] in 02/16/2012, history of melanoma excision, history of squamous cell carcinoma excision, who presents to the emergency department for chief concerns of left hip pain after a fall.  Patient states that she was taking out the trash in the driveway when she tripped and fell on her right hip.  She was able to bear weight at that time.  However as the day progressed, the pain became excruciating and she was not able to bear weight.  At its peak the pain was a 10 out of 10 and sharp.  Currently the pain is more tolerable however she is not able to bear weight.  She denies syncope, loss of consciousness, chest pain, shortness of breath, abdominal pain, diarrhea, hematuria.  She endorses dysuria however this has been ongoing for the last 6 months.  At baseline patient states that she is able to care for herself and perform her own ADLs.  She reports to nursing staff that a few months ago she decided to stop taking her medications because her doctor prescribing her too many medications.  Social history: Patient is a widow and she does not have children of her own.  She currently lives at independent living, [facility].  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  She denies tobacco, EtOH, recreational drug use.  ROS: Constitutional: no weight change, no fever ENT/Mouth: no sore throat, no rhinorrhea Eyes: no eye pain, no vision changes Cardiovascular: no chest pain, no dyspnea,  no edema, no palpitations Respiratory: no cough,  no sputum, no wheezing Gastrointestinal: no nausea, no vomiting, no diarrhea, no constipation Genitourinary: no urinary incontinence, no dysuria, no hematuria Musculoskeletal: no arthralgias, no myalgias, right hip pain Skin: no skin lesions, no pruritus, Neuro: + weakness, no loss of consciousness, no syncope Psych: no anxiety, no depression, no decrease appetite Heme/Lymph: no bruising, no bleeding  ED Course: Discussed with emergency medicine provider, patient requiring hospitalization for pain control, PT, OT.  Vitals in the emergency department was remarkable for temperature of 98.5, respiration rate of 15, heart rate of 57, blood pressure 189/60, SPO2 of 95% on room air.  Labs in the emergency department was remarkable for sodium 138, potassium 3.6, chloride 107, bicarb 23, BUN 27, serum creatinine of 1.71, nonfasting blood glucose 181.  Assessment/Plan  Principal Problem:   Hip pain Active Problems:   Hyperlipidemia   Essential hypertension, benign   Peripheral arterial disease (HCC)   History of melanoma   AAA (abdominal aortic aneurysm)   CLL (chronic lymphocytic leukemia) (HCC)   COPD (chronic obstructive pulmonary disease) (HCC)   # Right hip pain-presumed secondary to nonoperable right superior and inferior pubic symphysis fracture - MRI preliminary report: Acute minimally displaced obliquely oriented fractures of the right superior pubic ramus at the pubic symphysis, right inferior pubic ramus at the junction of the ischium, vertically oriented fracture of the right sacral alla without significant displacement in keeping with lateral compression type I pelvic fracture.  The pelvic symphysis and sacroiliac joints are not widened.  Extensive edema within the adjacent musculature including the  right piriformis, pectineus, obturator internus, obturator externus. - EDP consulted orthopedic surgery, Dr. Posey Pronto who recommends PT, OT, weightbearing as tolerated and pain control -  Supportive measures: Hydrocodone-acetaminophen 5-3 25, 1 tablet every 6 hours as needed for moderate pain; morphine 0.5 mg IV every 2 hours as needed for severe pain - TOC, PT, OT consulted  # Hypertension-elevated, presumed secondary to pain in setting of primary hypertension - EKG showed evidence of left ventricular hypertrophy which is a sign of hypertension - I resumed amlodipine 5 mg daily - Hydralazine 10 mg p.o. every 6 hours as needed for SBP greater than 180, 2 days ordered  # Hyperlipidemia-I resumed atorvastatin 20 mg nightly  # Chronic leukocytosis-on review of labs, patient appears to have chronic leukocytosis, presumed secondary toCLL - Continue outpatient follow-up with hematology/oncology - No other clinical signs or symptoms of infectious etiology at this time  # Thrombocytopenia-appears chronic  # CKD 3B/4-at baseline - Serum creatinine on presentation was 1.71, GFR 29, which has been her baseline over the last year  After pharmacy tech med reconciliation-patient was noted to not have any antihypertensive medication - On review of cardiology note from Dr. Rockey Situ on 02/04/2020: Patient is on amlodipine 5 mg daily, atorvastatin 20 mg nightly, alendronate 70 mg by mouth once a week, hydrocodone acetaminophen 5, take 1 to 2 tablets every 4 hours as needed for moderate pain, loratadine 10 mg daily, multivitamins, potassium chloride, tramadol 50 mg every 6 hours as needed for moderate pain, torsemide 20 mg by mouth, 1 tablet every other day as needed for swelling. - PCPs note was noted to not have current home medications listed on 10/17/2020, 03/28/2020, 02/21/2020 - Recommend extensive discussion with PCP regarding the importance of antihypertensive and hyperlipidemia medication  I attempted to have this discussion regarding medication compliance with patient however when I went back to [facility] [address], patient was asleep and as she has been in the emergency department since 9 AM, I did not  want to wake her.  Chart reviewed.   DVT prophylaxis: Enoxaparin subcutaneous every 24 hours Code Status: Full code Diet: Heart healthy Family Communication: No Disposition Plan: Pending clinical course Consults called: Orthopedic, Dr. Posey Pronto Admission status: MedSurg, observation, no telemetry  Past Medical History:  Diagnosis Date   AAA (abdominal aortic aneurysm) 2012   3.9cm, rec rpt 1 yr   Anemia    Anxiety    CHF (congestive heart failure) ([place])    Chronic venous insufficiency    CLL (chronic lymphocytic leukemia) ([place]) 2014   Stage 0--by flow cytometry   Hx of colonoscopy    Hyperlipidemia    Hypertension    Melanoma ([place])    Right Arm   Migraine headache    3-4X/yr   PAD (peripheral artery disease) ([place]) 2012   severe R common iliac stenosis, ABI 0.73, mid L common iliac stenosis ABI 1.1   PAD (peripheral artery disease) ([place])    Wears dentures    full upper and lower   Past Surgical History:  Procedure Laterality Date   ABDOMINAL AORTIC ANEURYSM REPAIR  02/16/12   [facility]--Dr Sammuel Hines   CARDIOVASCULAR STRESS TEST  2008   negative   CATARACT EXTRACTION W/PHACO Left 07/16/2019   Procedure: CATARACT EXTRACTION PHACO AND INTRAOCULAR LENS PLACEMENT (IOC) LEFT 8.26  00:46.6;  Surgeon: Eulogio Bear, MD;  Location: [facility];  Service: Ophthalmology;  Laterality: Left;   DOBUTAMINE STRESS ECHO  11/11   negative   HIP ARTHROPLASTY Right 10/10/2019  Procedure: ARTHROPLASTY BIPOLAR HIP (HEMIARTHROPLASTY);  Surgeon: Corky Mull, MD;  Location: [facility];  Service: Orthopedics;  Laterality: Right;   KNEE ARTHROSCOPY  1998   right   MELANOMA EXCISION  11/09/10   wide excision right upper arm   SQUAMOUS CELL CARCINOMA EXCISION  02/2016   TONSILLECTOMY AND ADENOIDECTOMY  1960   Social History:  reports that she has been smoking cigarettes. She has never used smokeless tobacco. She reports current alcohol use of about 7.0 standard drinks per week. She reports that  she does not use drugs.  Allergies  Allergen Reactions   Triamterene Hives   Family History  Problem Relation Age of Onset   Alzheimer's disease Mother    Pancreatic cancer Father    Parkinson's disease Brother    Coronary artery disease Neg Hx    Diabetes Neg Hx    Cancer Neg Hx        breast or colon   Family history: Family history reviewed and not pertinent  Prior to Admission medications   Medication Sig Start Date End Date Taking? Authorizing Provider  acetaminophen (TYLENOL) 325 MG tablet Take 2 tablets (650 mg total) by mouth every 6 (six) hours as needed. 03/11/21   Carrie Mew, MD  lidocaine (LIDODERM) 5 % Place 1 patch onto the skin daily. Remove & Discard patch within 12 hours or as directed by MD 03/11/21   Carrie Mew, MD  loratadine (CLARITIN) 10 MG tablet Take 10 mg by mouth daily.    [provider]   Physical Exam: Vitals:   04/02/21 1409 04/02/21 1421 04/02/21 2317 04/03/21 0131  BP:  (!) 199/62 (!) 189/60 (!) 199/60  Pulse:  70 (!) 57 63  Resp:  18 15   Temp:  98.2 F (36.8 C) 98.5 F (36.9 C) 98.3 F (36.8 C)  TempSrc:  Oral Oral Oral  SpO2:  95% 95% 96%  Weight: 64 kg     Height: 5' 4.5" (1.638 m)      Constitutional: appears younger than chronological age, frail, NAD, calm, comfortable Eyes: PERRL, lids and conjunctivae normal ENMT: Mucous membranes are moist. Posterior pharynx clear of any exudate or lesions. Age-appropriate dentition. Hearing appropriate Neck: normal, supple, no masses, no thyromegaly Respiratory: clear to auscultation bilaterally, no wheezing, no crackles. Normal respiratory effort. No accessory muscle use.  Cardiovascular: Regular rate and rhythm, no murmurs / rubs / gallops. No extremity edema. 2+ pedal pulses. No carotid bruits.  Abdomen: no tenderness, no masses palpated, no hepatosplenomegaly. Bowel sounds positive.  Musculoskeletal: no clubbing / cyanosis. No joint deformity upper and lower  extremities. Good ROM, no contractures, no atrophy. Normal muscle tone.  Right hip pain with deep palpation Skin: no rashes, lesions, ulcers. No induration Neurologic: Sensation intact. Strength 5/5 in all 4.  Psychiatric: Normal judgment and insight. Alert and oriented x 3. Normal mood.   EKG: independently reviewed, showing sinus rhythm with rate of 66, QTc 440, LVH  Chest x-ray on Admission: I personally reviewed and I agree with radiologist reading as below.  DG Chest 2 View  Result Date: 04/02/2021 CLINICAL DATA:  Status post fall with subsequent right-sided rib pain. EXAM: CHEST - 2 VIEW COMPARISON:  March 11, 2021 FINDINGS: The heart size and mediastinal contours are within normal limits. There is marked severity calcification of aortic arch and descending thoracic aorta. Both lungs are clear. Radiopaque surgical clips and vascular stent are seen overlying the midline of the upper abdomen. A chronic eighth left rib  deformity is seen. The visualized skeletal structures are otherwise unremarkable. IMPRESSION: No active cardiopulmonary disease. Electronically Signed   By: Virgina Norfolk M.D.   On: 04/02/2021 15:50   DG Hip Unilat  With Pelvis 2-3 Views Right  Result Date: 04/02/2021 CLINICAL DATA:  Status post fall with right hip pain. EXAM: DG HIP (WITH OR WITHOUT PELVIS) 2-3V RIGHT COMPARISON:  October 16, 2019 FINDINGS: A total right hip replacement is seen. There is no evidence of surrounding lucency to suggest the presence of hardware loosening or infection. There is no evidence of hip fracture or dislocation. There is no evidence of arthropathy or other focal bone abnormality. Radiopaque vascular stents are seen overlying the lower lumbar spine. A lobulated calcified uterine fibroid is suspected. IMPRESSION: 1. No acute abnormality. 2. Right hip replacement without evidence of hardware complication. Electronically Signed   By: Virgina Norfolk M.D.   On: 04/02/2021 15:52    Labs on  Admission: I have personally reviewed following labs CBC: Recent Labs  Lab 04/03/21 0045  WBC 16.9*  NEUTROABS 4.1  HGB 10.5*  HCT 31.6*  MCV 105.7*  PLT 902*   Basic Metabolic Panel: Recent Labs  Lab 04/03/21 0045  NA 138  K 3.6  CL 107  CO2 23  GLUCOSE 181*  BUN 27*  CREATININE 1.71*  CALCIUM 9.5   GFR: Estimated Creatinine Clearance: 21.2 mL/min (A) (by C-G formula based on SCr of 1.71 mg/dL (H)).  Dr. Tobie Poet [facility]  If 7PM-7AM, please contact overnight-coverage provider If 7AM-7PM, please contact day coverage provider [email or web address]  04/03/2021, 1:39 AM

## 2021-04-02 NOTE — ED Provider Notes (Addendum)
[facility] Emergency Department Provider Note   ____________________________________________   Event Date/Time   First MD Initiated Contact with Patient 04/02/21 1422     (approximate)  I have reviewed the triage vital signs and the nursing notes.   HISTORY  Chief Complaint Fall, Hip Pain, and Chest Pain   HPI Tiffany Martinez is a 87 y.o. female who is from [facility].  She was caring at the trash lost her balance and fell.  She says as the day progressed she can no longer tolerate the pain in her right ribs and her right hip.  It feels feels different than when she broke her hip and had it pinned some years ago.  Is still very painful.  The ribs are painful to touch and when she breathes and otherwise they do not hurt.  Pain is moderately severe if she is moving or trying to walk otherwise has not bad at all.        Past Medical History:  Diagnosis Date   AAA (abdominal aortic aneurysm) 2012   3.9cm, rec rpt 1 yr   Anemia    Anxiety    CHF (congestive heart failure) (HCC)    Chronic venous insufficiency    CLL (chronic lymphocytic leukemia) ([place]) 2014   Stage 0--by flow cytometry   Hx of colonoscopy    Hyperlipidemia    Hypertension    Melanoma ([place])    Right Arm   Migraine headache    3-4X/yr   PAD (peripheral artery disease) ([place]) 2012   severe R common iliac stenosis, ABI 0.73, mid L common iliac stenosis ABI 1.1   PAD (peripheral artery disease) ([place])    Wears dentures    full upper and lower    Patient Active Problem List   Diagnosis Date Noted   Thrombocytopenia ([place]) 03/28/2020   Malnutrition of mild degree ([place]) 11/29/2019   Secondary hyperparathyroidism of renal origin ([place]) 06/30/2018   Insomnia 02/02/2018   COPD (chronic obstructive pulmonary disease) ([place]) 12/26/2017   Chronic renal disease, stage IV ([place]) 12/26/2017   Aortic atherosclerosis ([place]) 09/14/2017   CLL (chronic lymphocytic leukemia) ([place]) 09/14/2017    Chronic diastolic heart failure ([place]) 08/25/2017   Advanced directives, counseling/discussion 01/15/2014   AAA (abdominal aortic aneurysm)    Routine general medical examination at a health care facility 07/07/2012   History of melanoma 12/24/2011   Peripheral arterial disease ([place]) 12/21/2010   History of squamous cell carcinoma of skin 09/28/2010   Chronic anemia 04/02/2010   VENOUS INSUFFICIENCY, CHRONIC 04/02/2010   Hyperlipidemia 06/23/2009   Episodic mood disorder ([place]) 06/23/2009   Essential hypertension, benign 06/23/2009    Past Surgical History:  Procedure Laterality Date   ABDOMINAL AORTIC ANEURYSM REPAIR  02/16/12   [facility]--Dr Sammuel Hines   CARDIOVASCULAR STRESS TEST  2008   negative   CATARACT EXTRACTION W/PHACO Left 07/16/2019   Procedure: CATARACT EXTRACTION PHACO AND INTRAOCULAR LENS PLACEMENT (IOC) LEFT 8.26  00:46.6;  Surgeon: Eulogio Bear, MD;  Location: [facility];  Service: Ophthalmology;  Laterality: Left;   DOBUTAMINE STRESS ECHO  11/11   negative   HIP ARTHROPLASTY Right 10/10/2019   Procedure: ARTHROPLASTY BIPOLAR HIP (HEMIARTHROPLASTY);  Surgeon: Corky Mull, MD;  Location: [facility];  Service: Orthopedics;  Laterality: Right;   KNEE ARTHROSCOPY  1998   right   MELANOMA EXCISION  11/09/10   wide excision right upper arm   SQUAMOUS CELL CARCINOMA EXCISION  02/2016   TONSILLECTOMY AND ADENOIDECTOMY  1960    Prior to Admission medications   Medication Sig Start Date End Date Taking? Authorizing Provider  acetaminophen (TYLENOL) 325 MG tablet Take 2 tablets (650 mg total) by mouth every 6 (six) hours as needed. 03/11/21   Carrie Mew, MD  lidocaine (LIDODERM) 5 % Place 1 patch onto the skin daily. Remove & Discard patch within 12 hours or as directed by MD 03/11/21   Carrie Mew, MD  loratadine (CLARITIN) 10 MG tablet Take 10 mg by mouth daily.    [provider]    Allergies Triamterene  Family History  Problem Relation Age  of Onset   Alzheimer's disease Mother    Pancreatic cancer Father    Parkinson's disease Brother    Coronary artery disease Neg Hx    Diabetes Neg Hx    Cancer Neg Hx        breast or colon    Social History Social History   Tobacco Use   Smoking status: Light Smoker    Types: Cigarettes    Last attempt to quit: 08/15/2017    Years since quitting: 3.6   Smokeless tobacco: Never   Tobacco comments:    occassionally  Vaping Use   Vaping Use: Never used  Substance Use Topics   Alcohol use: Yes    Alcohol/week: 7.0 standard drinks    Types: 7 Glasses of wine per week    Comment: regular wine with dinner   Drug use: No    Review of Systems  Constitutional: No fever/chills Eyes: No visual changes. ENT: No sore throat. Cardiovascular: Denies chest pain.  But see HPI Respiratory: Denies shortness of breath. Gastrointestinal: No abdominal pain.  No nausea, no vomiting.  No diarrhea.  No constipation. Genitourinary: Negative for dysuria. Musculoskeletal: Negative for back pain. Skin: Negative for rash. Neurological: Negative for headaches, focal weakness   ____________________________________________   PHYSICAL EXAM:  VITAL SIGNS: ED Triage Vitals  Enc Vitals Group     BP 04/02/21 1421 (!) 199/62     Pulse Rate 04/02/21 1421 70     Resp 04/02/21 1421 18     Temp 04/02/21 1421 98.2 F (36.8 C)     Temp Source 04/02/21 1421 Oral     SpO2 04/02/21 1421 95 %     Weight 04/02/21 1409 141 lb 1.5 oz (64 kg)     Height 04/02/21 1409 5' 4.5" (1.638 m)     Head Circumference --      Peak Flow --      Pain Score 04/02/21 1409 10     Pain Loc --      Pain Edu? --      Excl. in [place]? --     Constitutional: Alert and oriented. Well appearing and in no acute distress.  As long as she is still Eyes: Conjunctivae are normal. PERRL. EOMI. Head: Atraumatic. Nose: No congestion/rhinnorhea. Mouth/Throat: Mucous membranes are moist.  Oropharynx non-erythematous. Neck: No  stridor.  No cervical spine tenderness to palpation. Cardiovascular: Normal rate, regular rhythm. Grossly normal heart sounds.  Good peripheral circulation.  Right-sided rib tenderness palpation exactly reproduces the pain Respiratory: Normal respiratory effort.  No retractions. Lungs CTAB. Gastrointestinal: Soft and nontender. No distention. No abdominal bruits.  Musculoskeletal: No lower extremity tenderness nor edema.  There is  tenderness on palpation in the right hip area and patient reports she cannot bear weight Neurologic:  Normal speech and language. No gross focal neurologic deficits are appreciated.  Skin:  Skin is  warm, dry and intact. No rash noted.   ____________________________________________   LABS (all labs ordered are listed, but only abnormal results are displayed)  Labs Reviewed - No data to display ____________________________________________  EKG  EKG read interpreted by me shows normal sinus rhythm rate of 66 normal axis no acute ST-T wave changes although there is a flipped T in V6 only. ____________________________________________  RADIOLOGY Gertha Calkin, personally viewed and evaluated these images (plain radiographs) as part of my medical decision making, as well as reviewing the written report by the radiologist.  ED MD interpretation: X-rays of the chest hip and pelvis show no obvious acute fracture.  Radiology read the films and reviewed them  Official radiology report(s): DG Chest 2 View  Result Date: 04/02/2021 CLINICAL DATA:  Status post fall with subsequent right-sided rib pain. EXAM: CHEST - 2 VIEW COMPARISON:  March 11, 2021 FINDINGS: The heart size and mediastinal contours are within normal limits. There is marked severity calcification of aortic arch and descending thoracic aorta. Both lungs are clear. Radiopaque surgical clips and vascular stent are seen overlying the midline of the upper abdomen. A chronic eighth left rib deformity is  seen. The visualized skeletal structures are otherwise unremarkable. IMPRESSION: No active cardiopulmonary disease. Electronically Signed   By: Virgina Norfolk M.D.   On: 04/02/2021 15:50   DG Hip Unilat  With Pelvis 2-3 Views Right  Result Date: 04/02/2021 CLINICAL DATA:  Status post fall with right hip pain. EXAM: DG HIP (WITH OR WITHOUT PELVIS) 2-3V RIGHT COMPARISON:  October 16, 2019 FINDINGS: A total right hip replacement is seen. There is no evidence of surrounding lucency to suggest the presence of hardware loosening or infection. There is no evidence of hip fracture or dislocation. There is no evidence of arthropathy or other focal bone abnormality. Radiopaque vascular stents are seen overlying the lower lumbar spine. A lobulated calcified uterine fibroid is suspected. IMPRESSION: 1. No acute abnormality. 2. Right hip replacement without evidence of hardware complication. Electronically Signed   By: Virgina Norfolk M.D.   On: 04/02/2021 15:52    ____________________________________________   PROCEDURES  Procedure(s) performed (including Critical Care):  Procedures   ____________________________________________   INITIAL IMPRESSION / ASSESSMENT AND PLAN / ED COURSE   Patient reports she cannot bear weight on her right leg.  It felt feel somewhat like her previous hip fracture but not exactly.  I will get an MRI of the hip and pelvis to make sure there is no occult fracture.  I have seen this several times and been missed by CT.  If the MRI is negative we should be able to try to ambulate the patient.    ----------------------------------------- 4:06 PM on 04/02/2021 ----------------------------------------- I will sign this patient out to oncoming physician.  MRI has just been put in.         ____________________________________________   FINAL CLINICAL IMPRESSION(S) / ED DIAGNOSES  Final diagnoses:  Fall, initial encounter  Acute right hip pain  Rib pain on right  side     ED Discharge Orders     None        Note:  This document was prepared using Dragon voice recognition software and may include unintentional dictation errors.    Nena Polio, MD 04/02/21 1604    Nena Polio, MD 04/02/21 [phone]

## 2021-04-02 NOTE — Progress Notes (Signed)
Imaging reviewed. Patient with LC1 type pelvis injuries on R side with superior & inferior pubic rami fractures and minimally displaced R sacral ala fracture. Recommend WBAT on RLE and PT/OT for mobilization. May need admission to hospitalist team for placement.

## 2021-04-02 NOTE — ED Provider Notes (Signed)
  I assumed care of this patient approximately 1500.  Please see off coronavirus note for full details regarding patient's initial evaluation and assessment.  In brief patient presents for assessment of some right-sided chest pain and hip pain after mechanical ground-level fall.  She typically uses a walker to get around and lives in independent living at [facility].  She denies any other acute sick symptoms.  X-rays of the chest and hip are negative plan is to follow-up MRI with concern for possible occult fractures.  MRI remarkable for acute minimally displaced obliquely oriented fractures of the superior pubic ramus and right inferior pubic ramus as well as fracture of the right sacral ala without significant displacement.  There is edema of the surrounding musculature.  Patient states she has no pain on reassessment when sitting in bed only when she attempts to bear weight.  She cannot bear any weight on bedside trial.  I discussed patient with on-call orthopedist Dr. Posey Pronto who recommends weightbearing as tolerated and PT OT and admission for pain control if needed.  I think this is reasonable.  I will admit to medicine service for further evaluation and management.   Lucrezia Starch, MD 04/02/21 2325

## 2021-04-03 DIAGNOSIS — S329XXS Fracture of unspecified parts of lumbosacral spine and pelvis, sequela: Secondary | ICD-10-CM

## 2021-04-03 DIAGNOSIS — C9110 Chronic lymphocytic leukemia of B-cell type not having achieved remission: Secondary | ICD-10-CM

## 2021-04-03 DIAGNOSIS — M25559 Pain in unspecified hip: Secondary | ICD-10-CM | POA: Diagnosis not present

## 2021-04-03 DIAGNOSIS — S3289XA Fracture of other parts of pelvis, initial encounter for closed fracture: Secondary | ICD-10-CM | POA: Diagnosis not present

## 2021-04-03 DIAGNOSIS — S32810A Multiple fractures of pelvis with stable disruption of pelvic ring, initial encounter for closed fracture: Secondary | ICD-10-CM | POA: Diagnosis not present

## 2021-04-03 LAB — CBC WITH DIFFERENTIAL/PLATELET
Abs Immature Granulocytes: 0.05 10*3/uL (ref 0.00–0.07)
Basophils Absolute: 0 10*3/uL (ref 0.0–0.1)
Basophils Relative: 0 %
Eosinophils Absolute: 0.2 10*3/uL (ref 0.0–0.5)
Eosinophils Relative: 1 %
HCT: 31.6 % — ABNORMAL LOW (ref 36.0–46.0)
Hemoglobin: 10.5 g/dL — ABNORMAL LOW (ref 12.0–15.0)
Immature Granulocytes: 0 %
Lymphocytes Relative: 73 %
Lymphs Abs: 12.4 10*3/uL — ABNORMAL HIGH (ref 0.7–4.0)
MCH: 35.1 pg — ABNORMAL HIGH (ref 26.0–34.0)
MCHC: 33.2 g/dL (ref 30.0–36.0)
MCV: 105.7 fL — ABNORMAL HIGH (ref 80.0–100.0)
Monocytes Absolute: 0.3 10*3/uL (ref 0.1–1.0)
Monocytes Relative: 2 %
Neutro Abs: 4.1 10*3/uL (ref 1.7–7.7)
Neutrophils Relative %: 24 %
Platelets: 116 10*3/uL — ABNORMAL LOW (ref 150–400)
RBC: 2.99 MIL/uL — ABNORMAL LOW (ref 3.87–5.11)
RDW: 16.2 % — ABNORMAL HIGH (ref 11.5–15.5)
Smear Review: NORMAL
WBC: 16.9 10*3/uL — ABNORMAL HIGH (ref 4.0–10.5)
nRBC: 0.1 % (ref 0.0–0.2)

## 2021-04-03 LAB — CBG MONITORING, ED
Glucose-Capillary: 165 mg/dL — ABNORMAL HIGH (ref 70–99)
Glucose-Capillary: 91 mg/dL (ref 70–99)
Glucose-Capillary: 108 mg/dL — ABNORMAL HIGH (ref 70–99)

## 2021-04-03 LAB — RESP PANEL BY RT-PCR (FLU A&B, COVID) ARPGX2
Influenza A by PCR: NEGATIVE
Influenza B by PCR: NEGATIVE
SARS Coronavirus 2 by RT PCR: NEGATIVE

## 2021-04-03 LAB — BASIC METABOLIC PANEL
Anion gap: 8 (ref 5–15)
BUN: 27 mg/dL — ABNORMAL HIGH (ref 8–23)
CO2: 23 mmol/L (ref 22–32)
Calcium: 9.5 mg/dL (ref 8.9–10.3)
Chloride: 107 mmol/L (ref 98–111)
Creatinine, Ser: 1.71 mg/dL — ABNORMAL HIGH (ref 0.44–1.00)
GFR, Estimated: 29 mL/min — ABNORMAL LOW (ref >60)
Glucose, Bld: 181 mg/dL — ABNORMAL HIGH (ref 70–99)
Potassium: 3.6 mmol/L (ref 3.5–5.1)
Sodium: 138 mmol/L (ref 135–145)

## 2021-04-03 LAB — PATHOLOGIST SMEAR REVIEW

## 2021-04-03 LAB — HEMOGLOBIN A1C
Hgb A1c MFr Bld: 4.9 % (ref 4.8–5.6)
Mean Plasma Glucose: 93.93 mg/dL

## 2021-04-03 MED ORDER — HYDRALAZINE HCL 20 MG/ML IJ SOLN
10.0000 mg | INTRAMUSCULAR | Status: DC | PRN
  Administered 2021-04-03: 10 mg via INTRAVENOUS
  Filled 2021-04-03: qty 1

## 2021-04-03 MED ORDER — ADULT MULTIVITAMIN W/MINERALS CH
1.0000 | ORAL_TABLET | Freq: Every day | ORAL | Status: DC
  Administered 2021-04-03 – 2021-04-04 (×2): 1 via ORAL
  Filled 2021-04-03 (×2): qty 1

## 2021-04-03 MED ORDER — INSULIN ASPART 100 UNIT/ML IJ SOLN
0.0000 [IU] | Freq: Three times a day (TID) | INTRAMUSCULAR | Status: DC
  Administered 2021-04-03: 2 [IU] via SUBCUTANEOUS
  Filled 2021-04-03: qty 1

## 2021-04-03 MED ORDER — ENSURE ENLIVE PO LIQD
237.0000 mL | Freq: Two times a day (BID) | ORAL | Status: DC
Start: 2021-04-03 — End: 2021-04-04
  Administered 2021-04-03 – 2021-04-04 (×3): 237 mL via ORAL

## 2021-04-03 MED ORDER — AMLODIPINE BESYLATE 10 MG PO TABS
10.0000 mg | ORAL_TABLET | Freq: Every day | ORAL | Status: DC
  Administered 2021-04-04: 10 mg via ORAL
  Filled 2021-04-03: qty 1

## 2021-04-03 MED ORDER — ENOXAPARIN SODIUM 30 MG/0.3ML IJ SOSY
30.0000 mg | PREFILLED_SYRINGE | INTRAMUSCULAR | Status: DC
  Administered 2021-04-03 – 2021-04-04 (×2): 30 mg via SUBCUTANEOUS
  Filled 2021-04-03 (×2): qty 0.3

## 2021-04-03 MED ORDER — AMLODIPINE BESYLATE 5 MG PO TABS
5.0000 mg | ORAL_TABLET | Freq: Once | ORAL | Status: AC
  Administered 2021-04-03: 5 mg via ORAL
  Filled 2021-04-03: qty 1

## 2021-04-03 MED ORDER — ATORVASTATIN CALCIUM 20 MG PO TABS
20.0000 mg | ORAL_TABLET | Freq: Every day | ORAL | Status: DC
  Administered 2021-04-03 (×2): 20 mg via ORAL
  Filled 2021-04-03 (×2): qty 1

## 2021-04-03 MED ORDER — HYDRALAZINE HCL 10 MG PO TABS
10.0000 mg | ORAL_TABLET | Freq: Four times a day (QID) | ORAL | Status: DC | PRN
  Filled 2021-04-03: qty 1

## 2021-04-03 MED ORDER — AMLODIPINE BESYLATE 5 MG PO TABS
5.0000 mg | ORAL_TABLET | Freq: Every day | ORAL | Status: DC
  Administered 2021-04-03: 5 mg via ORAL
  Filled 2021-04-03 (×2): qty 1

## 2021-04-03 MED ORDER — INSULIN ASPART 100 UNIT/ML IJ SOLN: 0.0000 [IU] | Freq: Every day | INTRAMUSCULAR | Status: DC

## 2021-04-03 MED ORDER — MELATONIN 5 MG PO TABS: 5.0000 mg | ORAL_TABLET | Freq: Every evening | ORAL | Status: DC | PRN

## 2021-04-03 NOTE — Progress Notes (Signed)
Initial Nutrition Assessment  DOCUMENTATION CODES:   Not applicable  INTERVENTION:   -Ensure Enlive po BID, each supplement provides 350 kcal and 20 grams of protein  -MVI with minerals daily -Liberalize diet to regular  NUTRITION DIAGNOSIS:   Increased nutrient needs related to acute illness as evidenced by estimated needs.  GOAL:   Patient will meet greater than or equal to 90% of their needs  MONITOR:   PO intake, Supplement acceptance, Labs, Weight trends, Skin, I & O's  REASON FOR ASSESSMENT:   Consult Assessment of nutrition requirement/status, Hip fracture protocol  ASSESSMENT:   Tiffany Martinez is a pleasant 87 y.o. female with medical history significant for hypertension, history of abdominal aortic aneurysm status postrepair at [facility] in 02/16/2012, history of melanoma excision, history of squamous cell carcinoma excision, who presents to the emergency department for chief concerns of left hip pain after a fall.  Pt admitted with rt hip pain.   Pt unavailable at time of visit. RD unable to obtain further nutrition-related history or complete nutrition-focused physical exam at this time.     Per orthopedics note, pt with LC1 pelvis injuries, pubic rami fractures, and displaced rt sacral fracture. No plan for surgical interventions.   Reviewed wt hx; wt has been stable over the past year.   Pt with increased nutritional needs and wound beenfit from addition of oral nutrition supplements.   Medications reviewed and include miralax.  No results found for: HGBA1C PTA DM medications are .   Labs reviewed: no CBGS recorded (inpatient orders for glycemic control are 0-5 units insulin daily at bedtime and 0-9 units insulin aspart TID with meals).    Diet Order:   Diet Order             Diet Heart Room service appropriate? Yes; Fluid consistency: Thin  Diet effective now                   EDUCATION NEEDS:   No education needs have been identified at this  time  Skin:  Skin Assessment: Reviewed RN Assessment  Last BM:  Unknown  Height:   Ht Readings from Last 1 Encounters:  04/02/21 5' 4.5" (1.638 m)    Weight:   Wt Readings from Last 1 Encounters:  04/02/21 64 kg    Ideal Body Weight:  55.7 kg  BMI:  Body mass index is 23.84 kg/m.  Estimated Nutritional Needs:   Kcal:  1600-1800  Protein:  85-100 grams  Fluid:  > 1.6 L    Loistine Chance, RD, LDN, [place] Registered Dietitian II Certified Diabetes Care and Education Specialist Please refer to [facility] for RD and/or RD on-call/weekend/after hours pager

## 2021-04-03 NOTE — Evaluation (Signed)
Occupational Therapy Evaluation Patient Details Name: Tiffany Martinez MRN: [id number] DOB: [birth year] Today's Date: 04/03/2021   History of Present Illness Pt is an 87 y/o F who presented from [place] on 04/02/21 after experiencing a fall while taking out the trash. Pt with c/o R LE & R Rib pain. Imaging reveals acute minimally displaced obliquely oriented fractures of the right superior pubic ramus at the pubic symphysis, right inferior pubic ramus at the junction of the ischium, vertically oriented fracture of the right sacral alla without significant displacement in keeping with lateral compression type I pelvic fracture. PMH: AAA, anemia, anxiety, CHF, CLL, HLD, HTN, melanoma, PAD,   Clinical Impression   Pt seen for OT evaluation this date. Limited by R hip pain. She is able to CTS with MIN/MOD A with increased time and HHA. Pt demos F standing balance. She demos decreased standing tolerance as well. Seated in chair, she requires MOD A for LB dressing tasks d/t some limitations with  R LE. She does report decreased pain in R LE versus this AM with PT. She is returned to chair with all needs met. Ed re: role of OT. Will continue to follow. Recommend STR f/u.      Recommendations for follow up therapy are one component of a multi-disciplinary discharge planning process, led by the attending physician.  Recommendations may be updated based on patient status, additional functional criteria and insurance authorization.   Follow Up Recommendations  Skilled nursing-short term rehab (<3 hours/day)    Assistance Recommended at Discharge Frequent or constant Supervision/Assistance  Functional Status Assessment  Patient has had a recent decline in their functional status and demonstrates the ability to make significant improvements in function in a reasonable and predictable amount of time.  Equipment Recommendations  Other (comment) (defer to next level of care)    Recommendations for  Other Services       Precautions / Restrictions Precautions Precautions: Fall Restrictions Weight Bearing Restrictions: Yes RLE Weight Bearing: Weight bearing as tolerated      Mobility Bed Mobility               General bed mobility comments: up to chair pre/post    Transfers Overall transfer level: Needs assistance Equipment used: Rolling walker (2 wheels) Transfers: Sit to/from Stand Sit to Stand: Min assist;Mod assist           General transfer comment: increased time, pain limited, cues for safety      Balance Overall balance assessment: Needs assistance Sitting-balance support: Feet supported;Bilateral upper extremity supported Sitting balance-Leahy Scale: Good     Standing balance support: During functional activity;Bilateral upper extremity supported Standing balance-Leahy Scale: Fair Standing balance comment: BUE support on RW                           ADL either performed or assessed with clinical judgement   ADL Overall ADL's : Needs assistance/impaired                                       General ADL Comments: SETUP for UB ADLs, MOD A for LB ADLs, MIN/MOD A for ADL transfers     Vision Patient Visual Report: No change from baseline       Perception     Praxis      Pertinent Vitals/Pain Pain Assessment: Faces Faces Pain Scale:  Hurts even more Pain Location: R hip with movement/weight bearing, also c/o burning with urination (nurse made aware) Pain Descriptors / Indicators: Discomfort Pain Intervention(s): Limited activity within patient's tolerance;Monitored during session     Hand Dominance     Extremity/Trunk Assessment Upper Extremity Assessment Upper Extremity Assessment: Overall WFL for tasks assessed   Lower Extremity Assessment Lower Extremity Assessment: RLE deficits/detail RLE Deficits / Details: pain limiting rotation for LB ADLs.       Communication Communication Communication: No  difficulties   Cognition Arousal/Alertness: Awake/alert Behavior During Therapy: WFL for tasks assessed/performed Overall Cognitive Status: Within Functional Limits for tasks assessed                                 General Comments: pleasant, oriented, participatory     General Comments       Exercises Other Exercises Other Exercises: OT ed re: role of OT in acute setting and in general   Shoulder Instructions      Home Living Family/patient expects to be discharged to:: Private residence ([facility]) Living Arrangements: Alone Available Help at Discharge: Other (Comment);Available PRN/intermittently (aide a couple of times a week that drives her to the grocery store) Type of Home: Independent living facility Home Access: Level entry     Home Layout: One level     Bathroom Shower/Tub: Tub/shower unit         Home Equipment: Rollator (4 wheels);Rolling Walker (2 wheels)          Prior Functioning/Environment Prior Level of Function : Independent/Modified Independent             Mobility Comments: Pt reports she is mod I with rollator. Pt endorses "little ones" re: falls then states she "sort of trips & recovers my balance". Doesn't drive. ADLs Comments: family help for IADLs        OT Problem List: Decreased strength;Decreased activity tolerance      OT Treatment/Interventions: Self-care/ADL training;Therapeutic exercise;Therapeutic activities;DME and/or AE instruction    OT Goals(Current goals can be found in the care plan section) Acute Rehab OT Goals Patient Stated Goal: to get stronger OT Goal Formulation: With patient Time For Goal Achievement: 04/17/21 Potential to Achieve Goals: Good  OT Frequency: Min 1X/week   Barriers to D/C:            Co-evaluation              AM-PAC OT "6 Clicks" Daily Activity     Outcome Measure Help from another person eating meals?: None Help from another person taking care of personal  grooming?: A Little Help from another person toileting, which includes using toliet, bedpan, or urinal?: A Little Help from another person bathing (including washing, rinsing, drying)?: A Lot Help from another person to put on and taking off regular upper body clothing?: A Little Help from another person to put on and taking off regular lower body clothing?: A Lot 6 Click Score: 17   End of Session Equipment Utilized During Treatment: Gait belt;Rolling walker (2 wheels) Nurse Communication: Mobility status  Activity Tolerance: Patient tolerated treatment well Patient left: in chair;with call bell/phone within reach  OT Visit Diagnosis: Unsteadiness on feet (R26.81);Muscle weakness (generalized) (M62.81)                Time: 5520-8022 OT Time Calculation (min): 8 min Charges:  OT General Charges $OT Visit: 1 Visit OT Evaluation $OT Eval  Low Complexity: 1 Low  Gerrianne Scale, [place], OTR/L ascom [phone] 04/03/21, 5:02 PM

## 2021-04-03 NOTE — Consult Note (Signed)
ORTHOPAEDIC CONSULTATION  REQUESTING PHYSICIAN: Elgergawy, Silver Huguenin, MD  Chief Complaint:   R hip pain  History of Present Illness: Tiffany Martinez is a 87 y.o. female who had a fall yesterday onto her right hip while she was attempting to take her trash out at home.  She recalls being able to bear weight immediately afterwards, but had significant progressive pain such that she is unable to bear weight.  Of note, she has had a prior right hip Hemiarthroplasty performed by Dr. Roland Rack on 10/10/2019.  She states that this pain is nowhere as severe as the pain that she had with her hip fracture.  She lives at home alone.  She uses a walker frequently.  Radiographs in the emergency department did not show any significant pathology.  MRI showed minimally displaced fractures of the superior and inferior pubic ramus on the right side as well as a nondisplaced sacral ala fracture on the right.  Past Medical History:  Diagnosis Date   AAA (abdominal aortic aneurysm) 2012   3.9cm, rec rpt 1 yr   Anemia    Anxiety    CHF (congestive heart failure) (HCC)    Chronic venous insufficiency    CLL (chronic lymphocytic leukemia) ([place]) 2014   Stage 0--by flow cytometry   Hx of colonoscopy    Hyperlipidemia    Hypertension    Melanoma ([place])    Right Arm   Migraine headache    3-4X/yr   PAD (peripheral artery disease) ([place]) 2012   severe R common iliac stenosis, ABI 0.73, mid L common iliac stenosis ABI 1.1   PAD (peripheral artery disease) ([place])    Wears dentures    full upper and lower   Past Surgical History:  Procedure Laterality Date   ABDOMINAL AORTIC ANEURYSM REPAIR  02/16/12   [facility]--Dr Sammuel Hines   CARDIOVASCULAR STRESS TEST  2008   negative   CATARACT EXTRACTION W/PHACO Left 07/16/2019   Procedure: CATARACT EXTRACTION PHACO AND INTRAOCULAR LENS PLACEMENT (IOC) LEFT 8.26  00:46.6;  Surgeon: Eulogio Bear, MD;  Location: [place];  Service: Ophthalmology;  Laterality: Left;   DOBUTAMINE STRESS ECHO  11/11   negative   HIP ARTHROPLASTY Right 10/10/2019   Procedure: ARTHROPLASTY BIPOLAR HIP (HEMIARTHROPLASTY);  Surgeon: Corky Mull, MD;  Location: [facility];  Service: Orthopedics;  Laterality: Right;   KNEE ARTHROSCOPY  1998   right   MELANOMA EXCISION  11/09/10   wide excision right upper arm   SQUAMOUS CELL CARCINOMA EXCISION  02/2016   TONSILLECTOMY AND ADENOIDECTOMY  1960   Social History   Socioeconomic History   Marital status: Widowed    Spouse name: Not on file   Number of children: 4   Years of education: Not on file   Highest education level: Not on file  Occupational History   Occupation: retired- Therapist, sports,   Tobacco Use   Smoking status: Light Smoker    Types: Cigarettes    Last attempt to quit: 08/15/2017    Years since quitting: 3.6   Smokeless tobacco: Never   Tobacco comments:    occassionally  Vaping Use   Vaping Use: Never used  Substance and Sexual Activity   Alcohol use: Yes    Alcohol/week: 7.0 standard drinks    Types: 7 Glasses of wine per week    Comment: regular wine with dinner   Drug use: No   Sexual activity: Never  Other Topics Concern   Not on file  Social History Narrative  Widowed 2/11 -2nd for him , 1st for her. 4 step sons. 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         Has living will and health care POA--changing her POA at this point   Requests DNR   Discussed MOST form---but would probably accept IV fluids/antibiotics, etc   Requests no feeding tube         Social Determinants of Health   Financial Resource Strain: Not on file  Food Insecurity: Not on file  Transportation Needs: Not on file  Physical Activity: Not on file  Stress: Not on file  Social Connections: Not on file   Family History  Problem Relation Age of Onset   Alzheimer's disease Mother    Pancreatic cancer Father    Parkinson's disease  Brother    Coronary artery disease Neg Hx    Diabetes Neg Hx    Cancer Neg Hx        breast or colon   Allergies  Allergen Reactions   Triamterene Hives   Prior to Admission medications   Medication Sig Start Date End Date Taking? Authorizing Provider  acetaminophen (TYLENOL) 325 MG tablet Take 2 tablets (650 mg total) by mouth every 6 (six) hours as needed. 03/11/21  Yes Carrie Mew, MD  lidocaine (LIDODERM) 5 % Place 1 patch onto the skin daily. Remove & Discard patch within 12 hours or as directed by MD 03/11/21  Yes Carrie Mew, MD  loratadine (CLARITIN) 10 MG tablet Take 10 mg by mouth daily.   Yes [provider]   Recent Labs    04/03/21 0045  WBC 16.9*  HGB 10.5*  HCT 31.6*  PLT 116*  K 3.6  CL 107  CO2 23  BUN 27*  CREATININE 1.71*  GLUCOSE 181*  CALCIUM 9.5   DG Chest 2 View  Result Date: 04/02/2021 CLINICAL DATA:  Status post fall with subsequent right-sided rib pain. EXAM: CHEST - 2 VIEW COMPARISON:  March 11, 2021 FINDINGS: The heart size and mediastinal contours are within normal limits. There is marked severity calcification of aortic arch and descending thoracic aorta. Both lungs are clear. Radiopaque surgical clips and vascular stent are seen overlying the midline of the upper abdomen. A chronic eighth left rib deformity is seen. The visualized skeletal structures are otherwise unremarkable. IMPRESSION: No active cardiopulmonary disease. Electronically Signed   By: Virgina Norfolk M.D.   On: 04/02/2021 15:50   MR PELVIS WO CONTRAST  Result Date: 04/03/2021 CLINICAL DATA:  Pelvic trauma.  Unable to bear weight. EXAM: MR OF THE RIGHT HIP WITHOUT CONTRAST MR OF THE PELVIS WITHOUT CONTRAST TECHNIQUE: Multiplanar, multisequence MR imaging of the pelvis was performed. No intravenous contrast was administered. Multiplanar, multisequence MR imaging of the right hip was performed. No intravenous contrast was administered. COMPARISON:  CT  abdomen/pelvis 12/30/2015 FINDINGS: Bones: No left hip fracture, dislocation or avascular necrosis. Right hip arthroplasty without hardware failure or complication. No periarticular fluid collection or osteolysis. Nondisplaced vertical fracture through the right sacral ala with surrounding marrow edema. Nondisplaced fracture of the right pubic body with surrounding bone marrow edema. Nondisplaced fracture of the right inferior pubic ramus. Nondisplaced fracture of the left superior pubic ramus-acetabular junction with surrounding bone marrow edema. No periosteal reaction or bone destruction. No aggressive osseous lesion. Mild osteoarthritis of bilateral SI joints. Disc desiccation at L4-5 and L5-S1 with mild broad-based disc bulges. Bilateral facet arthropathy at L4-5. Articular cartilage and labrum  Articular cartilage: Right total hip arthroplasty. No focal chondral defect of the left hip. Labrum: Grossly intact, but evaluation is limited by lack of intraarticular fluid. Joint or bursal effusion Joint effusion:  No hip joint effusion.  No SI joint effusion. Bursae: Small amount of fluid in the right greater trochanteric bursa. Muscles and tendons Flexors: Normal. Extensors: Normal. Abductors: Normal. Adductors: Mild muscle edema in the right pectineus, obturator internus, obturator externus, piriformis and adductor magnus muscles. Gluteals: Normal. Hamstrings: Normal. Other findings No pelvic free fluid. No fluid collection or hematoma. No inguinal lymphadenopathy. No inguinal hernia. Multiple pelvic masses again noted with the largest measuring 5.9 cm , unchanged compared with 12/30/2015. IMPRESSION: 1. Right total hip arthroplasty without hardware failure or complication. 2. No left hip fracture or dislocation. 3. Nondisplaced vertical fracture through the right sacral ala with surrounding marrow edema. 4. Nondisplaced fracture of the right pubic body with surrounding bone marrow edema. 5. Nondisplaced fracture  of the right inferior pubic ramus. Nondisplaced fracture of the left superior pubic ramus-acetabular junction with surrounding bone marrow edema. 6. Mild muscle edema in the right pectineus, obturator internus, obturator externus, piriformis and adductor magnus muscles likely reflecting mild muscle strain. 7. Mild right greater trochanteric bursitis. 8. Multiple pelvic masses again noted with the largest measuring 5.9 cm likely reflecting uterine fibroids, unchanged compared with 12/30/2015. Electronically Signed   By: Kathreen Devoid M.D.   On: 04/03/2021 05:54   MR HIP RIGHT WO CONTRAST  Result Date: 04/03/2021 CLINICAL DATA:  Pelvic trauma.  Unable to bear weight. EXAM: MR OF THE RIGHT HIP WITHOUT CONTRAST MR OF THE PELVIS WITHOUT CONTRAST TECHNIQUE: Multiplanar, multisequence MR imaging of the pelvis was performed. No intravenous contrast was administered. Multiplanar, multisequence MR imaging of the right hip was performed. No intravenous contrast was administered. COMPARISON:  CT abdomen/pelvis 12/30/2015 FINDINGS: Bones: No left hip fracture, dislocation or avascular necrosis. Right hip arthroplasty without hardware failure or complication. No periarticular fluid collection or osteolysis. Nondisplaced vertical fracture through the right sacral ala with surrounding marrow edema. Nondisplaced fracture of the right pubic body with surrounding bone marrow edema. Nondisplaced fracture of the right inferior pubic ramus. Nondisplaced fracture of the left superior pubic ramus-acetabular junction with surrounding bone marrow edema. No periosteal reaction or bone destruction. No aggressive osseous lesion. Mild osteoarthritis of bilateral SI joints. Disc desiccation at L4-5 and L5-S1 with mild broad-based disc bulges. Bilateral facet arthropathy at L4-5. Articular cartilage and labrum Articular cartilage: Right total hip arthroplasty. No focal chondral defect of the left hip. Labrum: Grossly intact, but evaluation is  limited by lack of intraarticular fluid. Joint or bursal effusion Joint effusion:  No hip joint effusion.  No SI joint effusion. Bursae: Small amount of fluid in the right greater trochanteric bursa. Muscles and tendons Flexors: Normal. Extensors: Normal. Abductors: Normal. Adductors: Mild muscle edema in the right pectineus, obturator internus, obturator externus, piriformis and adductor magnus muscles. Gluteals: Normal. Hamstrings: Normal. Other findings No pelvic free fluid. No fluid collection or hematoma. No inguinal lymphadenopathy. No inguinal hernia. Multiple pelvic masses again noted with the largest measuring 5.9 cm , unchanged compared with 12/30/2015. IMPRESSION: 1. Right total hip arthroplasty without hardware failure or complication. 2. No left hip fracture or dislocation. 3. Nondisplaced vertical fracture through the right sacral ala with surrounding marrow edema. 4. Nondisplaced fracture of the right pubic body with surrounding bone marrow edema. 5. Nondisplaced fracture of the right inferior pubic ramus. Nondisplaced fracture of the left superior pubic ramus-acetabular  junction with surrounding bone marrow edema. 6. Mild muscle edema in the right pectineus, obturator internus, obturator externus, piriformis and adductor magnus muscles likely reflecting mild muscle strain. 7. Mild right greater trochanteric bursitis. 8. Multiple pelvic masses again noted with the largest measuring 5.9 cm likely reflecting uterine fibroids, unchanged compared with 12/30/2015. Electronically Signed   By: Kathreen Devoid M.D.   On: 04/03/2021 05:54   DG Hip Unilat  With Pelvis 2-3 Views Right  Result Date: 04/02/2021 CLINICAL DATA:  Status post fall with right hip pain. EXAM: DG HIP (WITH OR WITHOUT PELVIS) 2-3V RIGHT COMPARISON:  October 16, 2019 FINDINGS: A total right hip replacement is seen. There is no evidence of surrounding lucency to suggest the presence of hardware loosening or infection. There is no evidence of  hip fracture or dislocation. There is no evidence of arthropathy or other focal bone abnormality. Radiopaque vascular stents are seen overlying the lower lumbar spine. A lobulated calcified uterine fibroid is suspected. IMPRESSION: 1. No acute abnormality. 2. Right hip replacement without evidence of hardware complication. Electronically Signed   By: Virgina Norfolk M.D.   On: 04/02/2021 15:52     Positive ROS: All other systems have been reviewed and were otherwise negative with the exception of those mentioned in the HPI and as above.  Physical Exam: BP (!) 163/64 (BP Location: Right Arm)   Pulse (!) 59   Temp 98.3 F (36.8 C) (Oral)   Resp 16   Ht 5' 4.5" (1.638 m)   Wt 64 kg   LMP  (LMP Unknown)   SpO2 96%   BMI 23.84 kg/m  General:  Alert, no acute distress Psychiatric:  Patient is competent for consent with normal mood and affect   Cardiovascular:  No pedal edema, regular rate and rhythm Respiratory:  No wheezing, non-labored breathing GI:  Abdomen is soft and non-tender Skin:  No lesions in the area of chief complaint, no erythema Neurologic:  Sensation intact distally, CN grossly intact Lymphatic:  No axillary or cervical lymphadenopathy  Orthopedic Exam:  RLE: 5/5 DF/PF/EHL SILT s/s/t/sp/dp distr Foot wwp RoM hip: Able to flex to 90 degrees, IR 20 degrees, ER 30 degrees without any significant pain Negative logroll and negative axial load  Imaging:  As above: Minimally displaced superior and inferior pubic rami fractures on the right, nondisplaced sacral ala fracture on the right side.  Hip hemiarthroplasty present without any significant complication and without periprosthetic fracture  Assessment/Plan: 87 year old female with right superior and inferior pubic rami fracture and nondisplaced sacral ala fracture on the right side. 1.  Patient may weight-bear as tolerated on right lower extremity.  2.  PT/OT  3.  Can follow-up with [facility] clinic orthopedics in 2  weeks.    Leim Fabry   04/03/2021 2:58 PM

## 2021-04-03 NOTE — ED Notes (Signed)
Unable to obtain CBG @ this time. Pt is with PT.

## 2021-04-03 NOTE — NC FL2 (Signed)
[place] LEVEL OF CARE SCREENING TOOL     IDENTIFICATION  Patient Name: Tiffany Martinez Birthdate: 1933-09-28 Sex: female Admission Date (Current Location): 04/02/2021  [facility] [place] Number:  Engineering geologist and Address:         Provider Number: [id number]  Attending Physician Name and Address:  Elgergawy, Silver Huguenin, MD  Relative Name and Phone Number:       Current Level of Care: Hospital Recommended Level of Care: [place] Prior Approval Number:    Date Approved/Denied:   PASRR Number: [id number]  Discharge Plan: SNF    Current Diagnoses: Patient Active Problem List   Diagnosis Date Noted   Hip pain 04/02/2021   Thrombocytopenia ([place]) 03/28/2020   Malnutrition of mild degree ([place]) 11/29/2019   Secondary hyperparathyroidism of renal origin ([place]) 06/30/2018   Insomnia 02/02/2018   COPD (chronic obstructive pulmonary disease) ([place]) 12/26/2017   Chronic renal disease, stage IV ([place]) 12/26/2017   Aortic atherosclerosis ([place]) 09/14/2017   CLL (chronic lymphocytic leukemia) ([place]) 09/14/2017   Chronic diastolic heart failure ([place]) 08/25/2017   Advanced directives, counseling/discussion 01/15/2014   AAA (abdominal aortic aneurysm)    Routine general medical examination at a health care facility 07/07/2012   History of melanoma 12/24/2011   Peripheral arterial disease ([place]) 12/21/2010   History of squamous cell carcinoma of skin 09/28/2010   Chronic anemia 04/02/2010   VENOUS INSUFFICIENCY, CHRONIC 04/02/2010   Hyperlipidemia 06/23/2009   Episodic mood disorder ([place]) 06/23/2009   Essential hypertension, benign 06/23/2009    Orientation RESPIRATION BLADDER Height & Weight     Self, Time, Situation, Place  Normal   Weight: 141 lb 1.5 oz (64 kg) Height:  5' 4.5" (163.8 cm)  BEHAVIORAL SYMPTOMS/MOOD NEUROLOGICAL BOWEL NUTRITION STATUS        Diet  AMBULATORY STATUS COMMUNICATION OF NEEDS Skin   Limited Assist   Normal                        Personal Care Assistance Level of Assistance  Bathing, Feeding, Dressing, Total care Bathing Assistance: Independent Feeding assistance: Independent Dressing Assistance: Independent Total Care Assistance: Limited assistance   Functional Limitations Info  Sight, Hearing, Speech Sight Info: Adequate Hearing Info: Adequate Speech Info: Adequate    SPECIAL CARE FACTORS FREQUENCY  PT (By licensed PT), OT (By licensed OT)     PT Frequency: 5X per week OT Frequency: 5X per week            Contractures Contractures Info: Not present    Additional Factors Info                  Current Medications (04/03/2021):  This is the current hospital active medication list Current Facility-Administered Medications  Medication Dose Route Frequency Provider Last Rate Last Admin   amLODipine (NORVASC) tablet 5 mg  5 mg Oral Daily Cox, Amy N, DO   5 mg at 04/03/21 0125   atorvastatin (LIPITOR) tablet 20 mg  20 mg Oral QHS Cox, Amy N, DO   20 mg at 04/03/21 0125   enoxaparin (LOVENOX) injection 30 mg  30 mg Subcutaneous Q24H Cox, Amy N, DO   30 mg at 04/03/21 1151   feeding supplement (ENSURE ENLIVE / ENSURE PLUS) liquid 237 mL  237 mL Oral BID BM Elgergawy, Silver Huguenin, MD   237 mL at 04/03/21 1434   hydrALAZINE (APRESOLINE) tablet 10 mg  10 mg Oral Q6H PRN Cox,  Amy N, DO       HYDROcodone-acetaminophen (NORCO/VICODIN) 5-325 MG per tablet 1 tablet  1 tablet Oral Q6H PRN Cox, Amy N, DO   1 tablet at 04/03/21 1433   insulin aspart (novoLOG) injection 0-5 Units  0-5 Units Subcutaneous QHS Cox, Amy N, DO       insulin aspart (novoLOG) injection 0-9 Units  0-9 Units Subcutaneous TID WC Cox, Amy N, DO   2 Units at 04/03/21 1150   melatonin tablet 5 mg  5 mg Oral QHS PRN Cox, Amy N, DO       morphine 2 MG/ML injection 0.5 mg  0.5 mg Intravenous Q2H PRN Cox, Amy N, DO   0.5 mg at 04/03/21 1152   multivitamin with minerals tablet 1 tablet  1 tablet Oral Daily Elgergawy, Silver Huguenin, MD   1 tablet at 04/03/21 1150   polyethylene glycol (MIRALAX / GLYCOLAX) packet 17 g  17 g Oral Daily Cox, Amy N, DO   17 g at 04/03/21 1151   Current Outpatient Medications  Medication Sig Dispense Refill   acetaminophen (TYLENOL) 325 MG tablet Take 2 tablets (650 mg total) by mouth every 6 (six) hours as needed. 60 tablet 0   lidocaine (LIDODERM) 5 % Place 1 patch onto the skin daily. Remove & Discard patch within 12 hours or as directed by MD 15 patch 0   loratadine (CLARITIN) 10 MG tablet Take 10 mg by mouth daily.       Discharge Medications: Please see discharge summary for a list of discharge medications.  Relevant Imaging Results:  Relevant Lab Results:   Additional Information SS# [id number]  Adelene Amas, LCSWA

## 2021-04-03 NOTE — TOC Initial Note (Signed)
Transition of Care [facility]) - Initial/Assessment Note    Patient Details  Name: Tiffany Martinez MRN: [id number] Date of Birth: July 27, 1933  Transition of Care [facility]) CM/SW Contact:    Ova Freshwater Phone Number: [phone] 04/03/2021, 4:51 PM  Clinical Narrative:                  Patient pres[facility] with [facility] from [facility] independent living.  Patient is independent with all ADLs. PT/OT recommend SNF placement.  CSW spoke with [facility] admissions coordinator at [facility]  Seth Bake stated she would be willing to admit the patient tomorrow as long as all paperwork and orders were in by noon on 04/04/2021.  CSW updated Attending, who confirmed all necessary documentation and orders would be in by noon tomorrow.  Expected Discharge Plan: Skilled Nursing Facility Barriers to Discharge: SNF Pending bed offer   Patient Goals and CMS Choice        Expected Discharge Plan and Services Expected Discharge Plan: [place] In-house Referral: Clinical Social Work   Post Acute Care Choice: [place] Living arrangements for the past 2 months: [place] ([place])                                      Prior Living Arrangements/Services Living arrangements for the past 2 months: [place] ([place]) Lives with:: Self Patient language and need for interpreter reviewed:: Yes Do you feel safe going back to the place where you live?: Yes      Need for Family Participation in Patient Care: Yes (Comment) Care giver support system in place?: Yes (comment)   Criminal Activity/Legal Involvement Pertinent to Current Situation/Hospitalization: No - Comment as needed  Activities of Daily Living      Permission Sought/Granted Permission sought to share information with : Investment banker, corporate granted to share info w AGENCY: [facility] SNF         Emotional Assessment Appearance:: Appears stated age Attitude/Demeanor/Rapport: Engaged Affect (typically observed): Stable Orientation: : Oriented to Self, Oriented to Place, Oriented to  Time, Oriented to Situation Alcohol / Substance Use: Not Applicable Psych Involvement: No (comment)  Admission diagnosis:  Hip pain [M25.559] Patient Active Problem List   Diagnosis Date Noted   Hip pain 04/02/2021   Thrombocytopenia ([place]) 03/28/2020   Malnutrition of mild degree ([place]) 11/29/2019   Secondary hyperparathyroidism of renal origin ([place]) 06/30/2018   Insomnia 02/02/2018   COPD (chronic obstructive pulmonary disease) ([place]) 12/26/2017   Chronic renal disease, stage IV ([place]) 12/26/2017   Aortic atherosclerosis ([place]) 09/14/2017   CLL (chronic lymphocytic leukemia) ([place]) 09/14/2017   Chronic diastolic heart failure ([place]) 08/25/2017   Advanced directives, counseling/discussion 01/15/2014   AAA (abdominal aortic aneurysm)    Routine general medical examination at a health care facility 07/07/2012   History of melanoma 12/24/2011   Peripheral arterial disease ([place]) 12/21/2010   History of squamous cell carcinoma of skin 09/28/2010   Chronic anemia 04/02/2010   VENOUS INSUFFICIENCY, CHRONIC 04/02/2010   Hyperlipidemia 06/23/2009   Episodic mood disorder ([place]) 06/23/2009   Essential hypertension, benign 06/23/2009   PCP:  Venia Carbon, MD Pharmacy:   [facility] [place], [place] [address]  Phone: [phone] Fax: [phone]  [facility] [place], [place] [address] Phone: [phone] Fax: [phone]  [facility] [place], [place] [address] [place] [address] Phone: [phone] Fax: [phone]     Social Determinants  of Health (SDOH) Interventions    Readmission Risk Interventions No flowsheet data found.

## 2021-04-03 NOTE — Progress Notes (Signed)
PHARMACIST - PHYSICIAN COMMUNICATION  CONCERNING:  Enoxaparin (Lovenox) for DVT Prophylaxis    RECOMMENDATION: Patient was prescribed enoxaprin 40mg  q24 hours for VTE prophylaxis.   Filed Weights   04/02/21 1409  Weight: 64 kg (141 lb 1.5 oz)    Body mass index is 23.84 kg/m.  Estimated Creatinine Clearance: 21.2 mL/min (A) (by C-G formula based on SCr of 1.71 mg/dL (H)).  Patient is candidate for enoxaparin 30mg  every 24 hours based on CrCl <82ml/min or Weight <45kg  DESCRIPTION: Pharmacy has adjusted enoxaparin dose per [facility] policy.  Patient is now receiving enoxaparin 30 mg every 24 hours   Renda Rolls, PharmD, [facility] 04/03/2021 1:21 AM

## 2021-04-03 NOTE — Progress Notes (Signed)
PROGRESS NOTE    Tiffany Martinez  ZWC:[id number] DOB: 12/15/1933 DOA: 04/02/2021 PCP: Venia Carbon, MD   Chief Complaint  Patient presents with   Fall   Hip Pain   Chest Pain    Brief Narrative:     Tiffany Martinez is a pleasant 87 y.o. female with medical history significant for hypertension, history of abdominal aortic aneurysm status postrepair at [facility] in 02/16/2012, history of melanoma excision, history of squamous cell carcinoma excision, who presents to the emergency department for chief concerns of left hip pain after a fall.   Patient states that she was taking out the trash in the driveway when she tripped and fell on her right hip.  She was able to bear weight at that time.  However as the day progressed, the pain became excruciating and she was not able to bear weight.  At its peak the pain was a 10 out of 10 and sharp.  Currently the pain is more tolerable however she is not able to bear weight.   She denies syncope, loss of consciousness, chest pain, shortness of breath, abdominal pain, diarrhea, hematuria.  She endorses dysuria however this has been ongoing for the last 6 months.   At baseline patient states that she is able to care for herself and perform her own ADLs.   She reports to nursing staff that a few months ago she decided to stop taking her medications because her doctor prescribing her too many medications.   Social history: Patient is a widow and she does not have children of her own.  She currently lives at independent living, [facility] [place] [facility].  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  She denies tobacco, EtOH, recreational drug use.    Assessment & Plan:   Principal Problem:   Hip pain Active Problems:   Hyperlipidemia   Essential hypertension, benign   Peripheral arterial disease (HCC)   History of melanoma   AAA (abdominal aortic aneurysm)   CLL (chronic lymphocytic leukemia) (HCC)   COPD (chronic obstructive pulmonary disease) (HCC)  right  superior and inferior pubic rami fracture and nondisplaced sacral ala fracture on the right side -Orthopedic input greatly appreciated, plan for conservative management, this is nonoperative fracture, continue with weight-bear as tolerating on right lower extremity, she has been seen by PT OT, continue with pain management, plan for SNF placement [facility] hopefully tomorrow - Can follow-up with [facility] orthopedics in 2 weeks.   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  Patient will need to be discharged to SNF, as discussed with social worker, she will be able to go back to [place] [facility] tomorrow.  We will have her discharge papers ready before noon.    Consultants:  orthopedic   Subjective:  Complains of pain in the hip and groin area  Objective: Vitals:   04/03/21  0131 04/03/21 0300 04/03/21 0500 04/03/21 0615  BP: (!) 199/60 (!) 157/59 (!) 155/61 (!) 163/64  Pulse: 63 (!) 59 (!) 57 (!) 59  Resp:  16 15 16   Temp: 98.3 F (36.8 C)     TempSrc: Oral     SpO2: 96% 95% 94% 96%  Weight:      Height:       No intake or output data in the 24 hours ending 04/03/21 1601 Filed Weights   04/02/21 1409  Weight: 64 kg    Examination:  General exam: Appears calm and comfortable   Respiratory system: Clear to auscultation. Respiratory effort normal. Cardiovascular system: S1 & S2 heard, RRR. No JVD, murmurs, rubs, gallops or clicks. No pedal edema. Gastrointestinal system: Abdomen is nondistended, soft and nontender. No organomegaly or masses felt. Normal bowel sounds heard. Central nervous system: Alert and oriented. No focal neurological deficits. Extremities: Symmetric 5 x 5 power. Skin: No rashes, lesions or ulcers Psychiatry: Judgement and insight appear normal. Mood & affect appropriate.     Data Reviewed: I have personally reviewed following labs and imaging studies  CBC: Recent Labs  Lab 04/03/21 0045  WBC 16.9*  NEUTROABS 4.1  HGB 10.5*  HCT 31.6*  MCV 105.7*  PLT 116*    Basic Metabolic Panel: Recent Labs  Lab 04/03/21 0045  NA 138  K 3.6  CL 107  CO2 23  GLUCOSE 181*  BUN 27*  CREATININE 1.71*  CALCIUM 9.5    GFR: Estimated Creatinine Clearance: 21.2 mL/min (A) (by C-G formula based on SCr of 1.71 mg/dL (H)).  Liver Function Tests: No results for input(s): AST, ALT, ALKPHOS, BILITOT, PROT, ALBUMIN in the last 168 hours.  CBG: Recent Labs  Lab 04/03/21 1140  GLUCAP 165*     Recent Results (from the past 240 hour(s))  Resp Panel by RT-PCR (Flu A&B, Covid) Nasopharyngeal Swab     Status: None   Collection Time: 04/03/21 12:45 AM   Specimen: Nasopharyngeal Swab; Nasopharyngeal(NP) swabs in vial transport medium  Result Value Ref Range Status   SARS Coronavirus 2 by RT PCR NEGATIVE NEGATIVE Final    Comment: (NOTE) SARS-CoV-2 target nucleic acids are NOT DETECTED.  The SARS-CoV-2 RNA is generally detectable in upper respiratory specimens during the acute phase of infection. The lowest concentration of SARS-CoV-2 viral copies this assay can detect is 138 copies/mL. A negative result does not preclude SARS-Cov-2 infection and should not be used as the sole basis for treatment or other patient management decisions. A  negative result may occur with  improper specimen collection/handling, submission of specimen other than nasopharyngeal swab, presence of viral mutation(s) within the areas targeted by this assay, and inadequate number of viral copies(<138 copies/mL). A negative result must be combined with clinical observations, patient history, and epidemiological information. The expected result is Negative.  Fact Sheet for Patients:  EntrepreneurPulse.com.au  Fact Sheet for Healthcare Providers:  IncredibleEmployment.be  This test is no t yet approved or cleared by the [place] FDA and  has been authorized for detection and/or diagnosis of SARS-CoV-2 by FDA under an Emergency Use Authorization (EUA). This EUA will remain  in effect (meaning this test can be used) for the duration of the COVID-19 declaration under Section 564(b)(1) of the Act, 21 U.S.C.section 360bbb-3(b)(1), unless the authorization is terminated  or revoked sooner.       Influenza A by PCR NEGATIVE NEGATIVE Final   Influenza B by PCR NEGATIVE NEGATIVE Final    Comment: (  NOTE) The Xpert Xpress SARS-CoV-2/FLU/RSV plus assay is intended as an aid in the diagnosis of influenza from Nasopharyngeal swab specimens and should not be used as a sole basis for treatment. Nasal washings and aspirates are unacceptable for Xpert Xpress SARS-CoV-2/FLU/RSV testing.  Fact Sheet for Patients: EntrepreneurPulse.com.au  Fact Sheet for Healthcare Providers: IncredibleEmployment.be  This test is not yet approved or cleared by the [place] FDA and has been authorized for detection and/or diagnosis of SARS-CoV-2 by FDA under an Emergency Use Authorization (EUA). This EUA will remain in effect (meaning this test can be used) for the duration of the COVID-19 declaration under Section 564(b)(1) of the Act, 21 U.S.C. section 360bbb-3(b)(1), unless the authorization  is terminated or revoked.  Performed at [facility], [address], [place], [place] [address]          Radiology Studies: DG Chest 2 View  Result Date: 04/02/2021 CLINICAL DATA:  Status post fall with subsequent right-sided rib pain. EXAM: CHEST - 2 VIEW COMPARISON:  March 11, 2021 FINDINGS: The heart size and mediastinal contours are within normal limits. There is marked severity calcification of aortic arch and descending thoracic aorta. Both lungs are clear. Radiopaque surgical clips and vascular stent are seen overlying the midline of the upper abdomen. A chronic eighth left rib deformity is seen. The visualized skeletal structures are otherwise unremarkable. IMPRESSION: No active cardiopulmonary disease. Electronically Signed   By: Virgina Norfolk M.D.   On: 04/02/2021 15:50   MR PELVIS WO CONTRAST  Result Date: 04/03/2021 CLINICAL DATA:  Pelvic trauma.  Unable to bear weight. EXAM: MR OF THE RIGHT HIP WITHOUT CONTRAST MR OF THE PELVIS WITHOUT CONTRAST TECHNIQUE: Multiplanar, multisequence MR imaging of the pelvis was performed. No intravenous contrast was administered. Multiplanar, multisequence MR imaging of the right hip was performed. No intravenous contrast was administered. COMPARISON:  CT abdomen/pelvis 12/30/2015 FINDINGS: Bones: No left hip fracture, dislocation or avascular necrosis. Right hip arthroplasty without hardware failure or complication. No periarticular fluid collection or osteolysis. Nondisplaced vertical fracture through the right sacral ala with surrounding marrow edema. Nondisplaced fracture of the right pubic body with surrounding bone marrow edema. Nondisplaced fracture of the right inferior pubic ramus. Nondisplaced fracture of the left superior pubic ramus-acetabular junction with surrounding bone marrow edema. No periosteal reaction or bone destruction. No aggressive osseous lesion. Mild osteoarthritis of bilateral SI joints. Disc desiccation  at L4-5 and L5-S1 with mild broad-based disc bulges. Bilateral facet arthropathy at L4-5. Articular cartilage and labrum Articular cartilage: Right total hip arthroplasty. No focal chondral defect of the left hip. Labrum: Grossly intact, but evaluation is limited by lack of intraarticular fluid. Joint or bursal effusion Joint effusion:  No hip joint effusion.  No SI joint effusion. Bursae: Small amount of fluid in the right greater trochanteric bursa. Muscles and tendons Flexors: Normal. Extensors: Normal. Abductors: Normal. Adductors: Mild muscle edema in the right pectineus, obturator internus, obturator externus, piriformis and adductor magnus muscles. Gluteals: Normal. Hamstrings: Normal. Other findings No pelvic free fluid. No fluid collection or hematoma. No inguinal lymphadenopathy. No inguinal hernia. Multiple pelvic masses again noted with the largest measuring 5.9 cm , unchanged compared with 12/30/2015. IMPRESSION: 1. Right total hip arthroplasty without hardware failure or complication. 2. No left hip fracture or dislocation. 3. Nondisplaced vertical fracture through the right sacral ala with surrounding marrow edema. 4. Nondisplaced fracture of the right pubic body with surrounding bone marrow edema. 5. Nondisplaced fracture of the right inferior pubic ramus. Nondisplaced  fracture of the left superior pubic ramus-acetabular junction with surrounding bone marrow edema. 6. Mild muscle edema in the right pectineus, obturator internus, obturator externus, piriformis and adductor magnus muscles likely reflecting mild muscle strain. 7. Mild right greater trochanteric bursitis. 8. Multiple pelvic masses again noted with the largest measuring 5.9 cm likely reflecting uterine fibroids, unchanged compared with 12/30/2015. Electronically Signed   By: Kathreen Devoid M.D.   On: 04/03/2021 05:54   MR HIP RIGHT WO CONTRAST  Result Date: 04/03/2021 CLINICAL DATA:  Pelvic trauma.  Unable to bear weight. EXAM: MR OF  THE RIGHT HIP WITHOUT CONTRAST MR OF THE PELVIS WITHOUT CONTRAST TECHNIQUE: Multiplanar, multisequence MR imaging of the pelvis was performed. No intravenous contrast was administered. Multiplanar, multisequence MR imaging of the right hip was performed. No intravenous contrast was administered. COMPARISON:  CT abdomen/pelvis 12/30/2015 FINDINGS: Bones: No left hip fracture, dislocation or avascular necrosis. Right hip arthroplasty without hardware failure or complication. No periarticular fluid collection or osteolysis. Nondisplaced vertical fracture through the right sacral ala with surrounding marrow edema. Nondisplaced fracture of the right pubic body with surrounding bone marrow edema. Nondisplaced fracture of the right inferior pubic ramus. Nondisplaced fracture of the left superior pubic ramus-acetabular junction with surrounding bone marrow edema. No periosteal reaction or bone destruction. No aggressive osseous lesion. Mild osteoarthritis of bilateral SI joints. Disc desiccation at L4-5 and L5-S1 with mild broad-based disc bulges. Bilateral facet arthropathy at L4-5. Articular cartilage and labrum Articular cartilage: Right total hip arthroplasty. No focal chondral defect of the left hip. Labrum: Grossly intact, but evaluation is limited by lack of intraarticular fluid. Joint or bursal effusion Joint effusion:  No hip joint effusion.  No SI joint effusion. Bursae: Small amount of fluid in the right greater trochanteric bursa. Muscles and tendons Flexors: Normal. Extensors: Normal. Abductors: Normal. Adductors: Mild muscle edema in the right pectineus, obturator internus, obturator externus, piriformis and adductor magnus muscles. Gluteals: Normal. Hamstrings: Normal. Other findings No pelvic free fluid. No fluid collection or hematoma. No inguinal lymphadenopathy. No inguinal hernia. Multiple pelvic masses again noted with the largest measuring 5.9 cm , unchanged compared with 12/30/2015. IMPRESSION: 1.  Right total hip arthroplasty without hardware failure or complication. 2. No left hip fracture or dislocation. 3. Nondisplaced vertical fracture through the right sacral ala with surrounding marrow edema. 4. Nondisplaced fracture of the right pubic body with surrounding bone marrow edema. 5. Nondisplaced fracture of the right inferior pubic ramus. Nondisplaced fracture of the left superior pubic ramus-acetabular junction with surrounding bone marrow edema. 6. Mild muscle edema in the right pectineus, obturator internus, obturator externus, piriformis and adductor magnus muscles likely reflecting mild muscle strain. 7. Mild right greater trochanteric bursitis. 8. Multiple pelvic masses again noted with the largest measuring 5.9 cm likely reflecting uterine fibroids, unchanged compared with 12/30/2015. Electronically Signed   By: Kathreen Devoid M.D.   On: 04/03/2021 05:54   DG Hip Unilat  With Pelvis 2-3 Views Right  Result Date: 04/02/2021 CLINICAL DATA:  Status post fall with right hip pain. EXAM: DG HIP (WITH OR WITHOUT PELVIS) 2-3V RIGHT COMPARISON:  October 16, 2019 FINDINGS: A total right hip replacement is seen. There is no evidence of surrounding lucency to suggest the presence of hardware loosening or infection. There is no evidence of hip fracture or dislocation. There is no evidence of arthropathy or other focal bone abnormality. Radiopaque vascular stents are seen overlying the lower lumbar spine. A lobulated calcified uterine fibroid is suspected. IMPRESSION: 1.  No acute abnormality. 2. Right hip replacement without evidence of hardware complication. Electronically Signed   By: Virgina Norfolk M.D.   On: 04/02/2021 15:52        Scheduled Meds:  amLODipine  5 mg Oral Daily   atorvastatin  20 mg Oral QHS   enoxaparin (LOVENOX) injection  30 mg Subcutaneous Q24H   feeding supplement  237 mL Oral BID BM   insulin aspart  0-5 Units Subcutaneous QHS   insulin aspart  0-9 Units Subcutaneous TID WC    multivitamin with minerals  1 tablet Oral Daily   polyethylene glycol  17 g Oral Daily   Continuous Infusions:   LOS: 0 days      Phillips Climes, MD [facility]   To contact the attending provider between 7A-7P or the covering provider during after hours 7P-7A, please log into the web site [email or web address] and access using universal [place] password for that web site. If you do not have the password, please call the hospital operator.  04/03/2021, 4:01 PM

## 2021-04-03 NOTE — Evaluation (Signed)
Physical Therapy Evaluation Patient Details Name: Tiffany Martinez MRN: [id number] DOB: [birth year] Today's Date: 04/03/2021  History of Present Illness  Pt is an 87 y/o F who presented from [place] on 04/02/21 after experiencing a fall while taking out the trash. Pt with c/o R LE & R Rib pain. Imaging reveals acute minimally displaced obliquely oriented fractures of the right superior pubic ramus at the pubic symphysis, right inferior pubic ramus at the junction of the ischium, vertically oriented fracture of the right sacral alla without significant displacement in keeping with lateral compression type I pelvic fracture. PMH: AAA, anemia, anxiety, CHF, CLL, HLD, HTN, melanoma, PAD,  Clinical Impression  Pt seen for PT evaluation with pt denying pain at rest, but endorsing worsening R hip pain with movement/weight bearing. Pt is able to performs bed mobility with supervision but reliance on hospital bed features. Pt requires min<>Mod assist for sit>stand, depending on height of surface and CGA to ambulate with decreased gait speed. Pt's mobility & balance is impaired by RLE pain. At this time pt is unsafe to d/c home alone & would benefit from STR upon d/c to maximize independence with functional mobility & reduce fall risk prior to return home. Will continue to follow pt acutely to progress transfers, gait & activity tolerance as able.         Recommendations for follow up therapy are one component of a multi-disciplinary discharge planning process, led by the attending physician.  Recommendations may be updated based on patient status, additional functional criteria and insurance authorization.  Follow Up Recommendations Skilled nursing-short term rehab (<3 hours/day)    Assistance Recommended at Discharge Frequent or constant Supervision/Assistance  Functional Status Assessment Patient has had a recent decline in their functional status and demonstrates the ability to make significant  improvements in function in a reasonable and predictable amount of time.  Equipment Recommendations  None recommended by PT    Recommendations for Other Services       Precautions / Restrictions Precautions Precautions: Fall Restrictions Weight Bearing Restrictions: Yes RLE Weight Bearing: Weight bearing as tolerated      Mobility  Bed Mobility Overal bed mobility: Needs Assistance Bed Mobility: Supine to Sit     Supine to sit: Supervision;HOB elevated     General bed mobility comments: uses LLE to move RLE to EOB    Transfers Overall transfer level: Needs assistance Equipment used: Rolling walker (2 wheels) Transfers: Sit to/from Stand Sit to Stand: Min assist;Mod assist           General transfer comment: min assist from elevated EOB, mod assist from low toilet in bathroom, cuing for hand placement    Ambulation/Gait Ambulation/Gait assistance: Min guard Gait Distance (Feet): 35 Feet (x 2) Assistive device: Rolling walker (2 wheels)   Gait velocity: decreased     General Gait Details: step to progressing to step through pattern, cuing for smaller step length RLE for pain management, uses BUE support on RW to decrease weight bearing through RLE.  Stairs            Wheelchair Mobility    Modified Rankin (Stroke Patients Only)       Balance Overall balance assessment: Needs assistance Sitting-balance support: Feet supported;Bilateral upper extremity supported Sitting balance-Leahy Scale: Good     Standing balance support: During functional activity;Bilateral upper extremity supported Standing balance-Leahy Scale: Fair Standing balance comment: BUE support on RW  Pertinent Vitals/Pain Pain Assessment: Faces Faces Pain Scale: Hurts even more Pain Location: R hip with movement/weight bearing, also c/o burning with urination (nurse made aware) Pain Descriptors / Indicators: Discomfort Pain  Intervention(s): Limited activity within patient's tolerance;Monitored during session    [place] expects to be discharged to:: Private residence [facility] ILF) Living Arrangements: Alone Available Help at Discharge: Other (Comment);Available PRN/intermittently (aide a couple of times a week that drives her to the grocery store) Type of Home: Independent living facility Home Access: Level entry       Home Layout: One level Home Equipment: Rollator (4 wheels);Rolling Walker (2 wheels)      Prior Function Prior Level of Function : Independent/Modified Independent             Mobility Comments: Pt reports she is mod I with rollator. Pt endorses "little ones" re: falls then states she "sort of trips & recovers my balance". Doesn't drive.       Hand Dominance        Extremity/Trunk Assessment   Upper Extremity Assessment Upper Extremity Assessment: Overall WFL for tasks assessed    Lower Extremity Assessment Lower Extremity Assessment: RLE deficits/detail RLE Deficits / Details: able to perform 3/5 knee extension in sitting but not formally tested 2/2 pain, no buckling noted during gait RLE: Unable to fully assess due to pain       Communication   Communication: No difficulties  Cognition Arousal/Alertness: Awake/alert Behavior During Therapy: WFL for tasks assessed/performed Overall Cognitive Status: Within Functional Limits for tasks assessed                                 General Comments: Very pleasant lady, encouraged to mobility during session.        General Comments General comments (skin integrity, edema, etc.): Pt with continent void & performs peri hygiene during session. Pt c/o burning with urination & nurse notified.    Exercises     Assessment/Plan    PT Assessment Patient needs continued PT services  PT Problem List Decreased strength;Pain;Decreased activity tolerance;Decreased balance;Decreased  mobility;Decreased knowledge of use of DME       PT Treatment Interventions DME instruction;Therapeutic exercise;Gait training;Stair training;Neuromuscular re-education;Modalities;Manual techniques;Balance training;Functional mobility training;Patient/family education;Therapeutic activities    PT Goals (Current goals can be found in the Care Plan section)  Acute Rehab PT Goals Patient Stated Goal: decreased pain, return to PLOF PT Goal Formulation: With patient Time For Goal Achievement: 04/17/21 Potential to Achieve Goals: Good    Frequency 7X/week   Barriers to discharge Decreased caregiver support lives alone    Co-evaluation               AM-PAC PT "6 Clicks" Mobility  Outcome Measure Help needed turning from your back to your side while in a flat bed without using bedrails?: A Little Help needed moving from lying on your back to sitting on the side of a flat bed without using bedrails?: A Little Help needed moving to and from a bed to a chair (including a wheelchair)?: A Little Help needed standing up from a chair using your arms (e.g., wheelchair or bedside chair)?: A Little Help needed to walk in hospital room?: A Little Help needed climbing 3-5 steps with a railing? : Total 6 Click Score: 16    End of Session Equipment Utilized During Treatment: Gait belt Activity Tolerance: Patient tolerated treatment well Patient left: in  chair;with call bell/phone within reach Nurse Communication: Mobility status PT Visit Diagnosis: Unsteadiness on feet (R26.81);Muscle weakness (generalized) (M62.81);Difficulty in walking, not elsewhere classified (R26.2);Pain Pain - Right/Left: Right Pain - part of body: Hip    Time: 0903-0928 PT Time Calculation (min) (ACUTE ONLY): 25 min   Charges:   PT Evaluation $PT Eval Low Complexity: 1 Low PT Treatments $Therapeutic Activity: 8-22 mins        Lavone Nian, PT, DPT 04/03/21, 10:25 AM   Waunita Schooner 04/03/2021,  10:23 AM

## 2021-04-04 DIAGNOSIS — D696 Thrombocytopenia, unspecified: Secondary | ICD-10-CM | POA: Diagnosis not present

## 2021-04-04 DIAGNOSIS — I5032 Chronic diastolic (congestive) heart failure: Secondary | ICD-10-CM | POA: Diagnosis not present

## 2021-04-04 DIAGNOSIS — S32591D Other specified fracture of right pubis, subsequent encounter for fracture with routine healing: Secondary | ICD-10-CM | POA: Diagnosis not present

## 2021-04-04 DIAGNOSIS — S329XXD Fracture of unspecified parts of lumbosacral spine and pelvis, subsequent encounter for fracture with routine healing: Secondary | ICD-10-CM | POA: Diagnosis not present

## 2021-04-04 DIAGNOSIS — N184 Chronic kidney disease, stage 4 (severe): Secondary | ICD-10-CM | POA: Diagnosis not present

## 2021-04-04 DIAGNOSIS — J449 Chronic obstructive pulmonary disease, unspecified: Secondary | ICD-10-CM | POA: Diagnosis not present

## 2021-04-04 DIAGNOSIS — I10 Essential (primary) hypertension: Secondary | ICD-10-CM

## 2021-04-04 DIAGNOSIS — S329XXA Fracture of unspecified parts of lumbosacral spine and pelvis, initial encounter for closed fracture: Secondary | ICD-10-CM | POA: Diagnosis not present

## 2021-04-04 DIAGNOSIS — C9110 Chronic lymphocytic leukemia of B-cell type not having achieved remission: Secondary | ICD-10-CM | POA: Diagnosis not present

## 2021-04-04 DIAGNOSIS — S72001D Fracture of unspecified part of neck of right femur, subsequent encounter for closed fracture with routine healing: Secondary | ICD-10-CM | POA: Diagnosis not present

## 2021-04-04 DIAGNOSIS — R300 Dysuria: Secondary | ICD-10-CM | POA: Diagnosis not present

## 2021-04-04 DIAGNOSIS — I739 Peripheral vascular disease, unspecified: Secondary | ICD-10-CM | POA: Diagnosis not present

## 2021-04-04 DIAGNOSIS — R4189 Other symptoms and signs involving cognitive functions and awareness: Secondary | ICD-10-CM | POA: Diagnosis not present

## 2021-04-04 DIAGNOSIS — Z7401 Bed confinement status: Secondary | ICD-10-CM | POA: Diagnosis not present

## 2021-04-04 DIAGNOSIS — Z741 Need for assistance with personal care: Secondary | ICD-10-CM | POA: Diagnosis not present

## 2021-04-04 DIAGNOSIS — Z20822 Contact with and (suspected) exposure to covid-19: Secondary | ICD-10-CM | POA: Diagnosis not present

## 2021-04-04 DIAGNOSIS — S32511D Fracture of superior rim of right pubis, subsequent encounter for fracture with routine healing: Secondary | ICD-10-CM | POA: Diagnosis not present

## 2021-04-04 DIAGNOSIS — S329XXS Fracture of unspecified parts of lumbosacral spine and pelvis, sequela: Secondary | ICD-10-CM | POA: Diagnosis not present

## 2021-04-04 DIAGNOSIS — S3289XA Fracture of other parts of pelvis, initial encounter for closed fracture: Secondary | ICD-10-CM | POA: Diagnosis not present

## 2021-04-04 DIAGNOSIS — I130 Hypertensive heart and chronic kidney disease with heart failure and stage 1 through stage 4 chronic kidney disease, or unspecified chronic kidney disease: Secondary | ICD-10-CM | POA: Diagnosis not present

## 2021-04-04 DIAGNOSIS — R2681 Unsteadiness on feet: Secondary | ICD-10-CM | POA: Diagnosis not present

## 2021-04-04 DIAGNOSIS — R0781 Pleurodynia: Secondary | ICD-10-CM | POA: Diagnosis not present

## 2021-04-04 DIAGNOSIS — M6281 Muscle weakness (generalized): Secondary | ICD-10-CM | POA: Diagnosis not present

## 2021-04-04 DIAGNOSIS — R278 Other lack of coordination: Secondary | ICD-10-CM | POA: Diagnosis not present

## 2021-04-04 LAB — GLUCOSE, CAPILLARY
Glucose-Capillary: 112 mg/dL — ABNORMAL HIGH (ref 70–99)
Glucose-Capillary: 106 mg/dL — ABNORMAL HIGH (ref 70–99)

## 2021-04-04 MED ORDER — MELATONIN 5 MG PO TABS: 5.0000 mg | ORAL_TABLET | Freq: Every evening | ORAL | 0 refills | Status: DC | PRN

## 2021-04-04 MED ORDER — ADULT MULTIVITAMIN W/MINERALS CH
1.0000 | ORAL_TABLET | Freq: Every day | ORAL | Status: DC
Start: 2021-04-04 — End: 2021-04-24

## 2021-04-04 MED ORDER — AMLODIPINE BESYLATE 10 MG PO TABS: 10.0000 mg | ORAL_TABLET | Freq: Every day | ORAL | Status: DC

## 2021-04-04 MED ORDER — HYDRALAZINE HCL 25 MG PO TABS: 25.0000 mg | ORAL_TABLET | Freq: Three times a day (TID) | ORAL | Status: DC | PRN

## 2021-04-04 MED ORDER — ENSURE ENLIVE PO LIQD: 237.0000 mL | Freq: Two times a day (BID) | ORAL | 12 refills | Status: DC

## 2021-04-04 MED ORDER — HYDROCODONE-ACETAMINOPHEN 5-325 MG PO TABS: 1.0000 | ORAL_TABLET | Freq: Four times a day (QID) | ORAL | 0 refills | Status: DC | PRN

## 2021-04-04 MED ORDER — ACETAMINOPHEN 325 MG PO TABS: 325.0000 mg | ORAL_TABLET | Freq: Four times a day (QID) | ORAL | 0 refills | Status: DC | PRN

## 2021-04-04 MED ORDER — ATORVASTATIN CALCIUM 20 MG PO TABS
20.0000 mg | ORAL_TABLET | Freq: Every day | ORAL | Status: DC
Start: 2021-04-04 — End: 2022-07-30

## 2021-04-04 MED ORDER — POLYETHYLENE GLYCOL 3350 17 G PO PACK: 17.0000 g | PACK | Freq: Every day | ORAL | 0 refills | Status: DC | PRN

## 2021-04-04 NOTE — Discharge Instructions (Addendum)
Follow with Primary MD Venia Carbon, MD in 7 days   Get CBC, CMP, IN 3 DAYS   Activity: As tolerated with Full fall precautions use walker/cane & assistance as needed, Patient may weight-bear as tolerated on right lower extremity.   Disposition [facility]   Diet: Heart Healthy .   On your next visit with your primary care physician please Get Medicines reviewed and adjusted.   Please request your Prim.MD to go over all Hospital Tests and Procedure/Radiological results at the follow up, please get all Hospital records sent to your Prim MD by signing hospital release before you go home.   If you experience worsening of your admission symptoms, develop shortness of breath, life threatening emergency, suicidal or homicidal thoughts you must seek medical attention immediately by calling 911 or calling your MD immediately  if symptoms less severe.  You Must read complete instructions/literature along with all the possible adverse reactions/side effects for all the Medicines you take and that have been prescribed to you. Take any new Medicines after you have completely understood and accpet all the possible adverse reactions/side effects.   Do not drive, operating heavy machinery, perform activities at heights, swimming or participation in water activities or provide baby sitting services if your were admitted for syncope or siezures until you have seen by Primary MD or a Neurologist and advised to do so again.  Do not drive when taking Pain medications.    Do not take more than prescribed Pain, Sleep and Anxiety Medications  Special Instructions: If you have smoked or chewed Tobacco  in the last 2 yrs please stop smoking, stop any regular Alcohol  and or any Recreational drug use.  Wear Seat belts while driving.   Please note  You were cared for by a hospitalist during your hospital stay. If you have any questions about your discharge medications or the care you received while  you were in the hospital after you are discharged, you can call the unit and asked to speak with the hospitalist on call if the hospitalist that took care of you is not available. Once you are discharged, your primary care physician will handle any further medical issues. Please note that NO REFILLS for any discharge medications will be authorized once you are discharged, as it is imperative that you return to your primary care physician (or establish a relationship with a primary care physician if you do not have one) for your aftercare needs so that they can reassess your need for medications and monitor your lab values.

## 2021-04-04 NOTE — TOC Transition Note (Signed)
Transition of Care [facility]) - CM/SW Discharge Note   Patient Details  Name: Tiffany Martinez MRN: [id number] Date of Birth: 1934-03-16  Transition of Care [facility] CM/SW Contact:  Harriet Masson, RN Phone Number:[phone] 04/04/2021, 9:57 AM   Clinical Narrative:    Pt to be discharged today to [facility]. Spoke with Seth Bake who confirmed room #108 and report for floor nurse is [phone]. Discharge summary sent via HIB to the facility. Updated pt on the transport back to the facility today who agreed to EMS for transport services. [facility] will provide transportation services today. No other request or needs ata this time.   TOC will remain available for any additional needs.   Final next level of care: Skilled Nursing Facility Barriers to Discharge: SNF Pending bed offer   Patient Goals and CMS Choice        Discharge Placement                       Discharge Plan and Services In-house Referral: Clinical Social Work   Post Acute Care Choice: [place]                               Social Determinants of Health (SDOH) Interventions     Readmission Risk Interventions No flowsheet data found.

## 2021-04-04 NOTE — Discharge Summary (Signed)
Physician Discharge Summary  Tiffany Martinez SFK:[id number] DOB: [birth year] DOA: 04/02/2021  PCP: Venia Carbon, MD  Admit date: 04/02/2021 Discharge date: 04/04/2021  [place] [facility] Disposition:  [facility]  Recommendations for Outpatient Follow-up:  Follow up with SNF physician in 3 days Please obtain BMP/CBC in 3 days Patient to follow with [facility] orthopedic and 2 weeks. Please continue to monitor blood pressure closely and adjust regimen as needed.    Discharge Condition:Stable CODE STATUS:DNR Diet recommendation: Heart Healthy  Brief/Interim Summary:   Tiffany Martinez is a pleasant 87 y.o. female with medical history significant for hypertension, history of abdominal aortic aneurysm status postrepair at [facility] in 02/16/2012, history of melanoma excision, history of squamous cell carcinoma excision, who presents to the emergency department for chief concerns of left hip pain after a fall.   Patient states that she was taking out the trash in the driveway when she tripped and fell on her right hip.  She was able to bear weight at that time.  However as the day progressed, the pain became excruciating and she was not able to bear weight.   She denies syncope, loss of consciousness, chest pain, shortness of breath, abdominal pain, diarrhea, hematuria.  She endorses dysuria however this has been ongoing for the last 6 months.   At baseline patient states that she is able to care for herself and perform her own ADLs.   She reports to nursing staff that a few months ago she decided to stop taking her medications because her doctor prescribing her too many medications.  Patient work-up was significant for pelvic fracture, with recommendation for nonsurgical management, please see discussion below.   Social history: Patient is a widow and she does not have children of her own.  She currently lives at independent living, [place].  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  She denies tobacco, EtOH, recreational drug use.     Pelvic fracture >right superior and inferior pubic rami fracture and nondisplaced sacral ala fracture on the right side -Orthopedic input greatly appreciated, plan for conservative management, this is nonoperative fracture, continue with weight-bear as tolerating on right lower extremity, she has been seen by PT OT, continue with pain management, plan for SNF [facility]  - Can follow-up with [facility] orthopedics in 2 weeks.   Hypertension -Blood pressure was noted to be elevated during hospital stay, unclear related to her pain or not, but apparently she stopped taking all her meds few month ago, unclear if she was on any antihypertensive medications or not.   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 - Serum creatinine on presentation was 1.71, GFR 29, which has been her baseline over the last year     Discharge Diagnoses:  Principal Problem:   Pelvic fracture ([place]) Active Problems:   Hyperlipidemia   Essential hypertension, benign   Peripheral arterial disease (HCC)   History of melanoma   AAA (abdominal aortic aneurysm)   CLL (chronic lymphocytic leukemia) (HCC)   COPD (chronic obstructive pulmonary disease) (HCC)   Hip pain    Discharge Instructions  Discharge Instructions     Diet - low sodium heart healthy   Complete by: As directed    Discharge instructions   Complete by: As directed    Follow with Primary MD Venia Carbon, MD in 7 days   Get CBC, CMP, IN 3  DAYS   Activity: As tolerated with Full fall precautions use walker/cane & assistance as needed, Patient may weight-bear as tolerated on right lower extremity.   Disposition [facility]   Diet: Heart Healthy .   On your next visit with your primary care physician please Get Medicines reviewed and adjusted.   Please request your Prim.MD to go over all Hospital Tests and Procedure/Radiological results at the follow up, please get all Hospital records sent to your Prim MD by signing hospital release before you go home.   If you experience worsening of your admission symptoms, develop shortness of breath, life threatening emergency, suicidal or homicidal thoughts you must seek medical attention immediately by calling 911 or calling your MD immediately  if symptoms less severe.  You Must read complete instructions/literature along with all the possible adverse reactions/side effects for all the Medicines you take and that have been prescribed to you. Take any new Medicines after you have completely understood and accpet all the possible adverse reactions/side effects.   Do not drive, operating heavy machinery, perform activities at heights, swimming or participation in water activities or provide baby sitting services if your were admitted for syncope or siezures until you have seen by Primary MD or a Neurologist and advised to do so again.  Do not drive when taking Pain medications.    Do not take more than prescribed Pain, Sleep and Anxiety Medications  Special Instructions: If you have smoked or chewed Tobacco  in the last 2 yrs please stop smoking, stop any regular Alcohol  and or any Recreational drug use.  Wear Seat belts while driving.   Please note  You were cared for by a hospitalist during your hospital stay. If you have any questions about your discharge medications or the care you received while you were in the hospital after you are discharged, you can call the unit and  asked to speak with the hospitalist on call if the hospitalist that took care of you is not available. Once you are discharged, your primary care physician will handle any further medical issues. Please note that NO REFILLS for any discharge medications will be authorized once you are discharged, as it is imperative that you return to your primary care physician (or establish a relationship with a primary care physician if you do not have one) for your aftercare needs so that they can reassess your need for medications and monitor your lab values.   Increase activity slowly   Complete by: As directed       Allergies as of 04/04/2021       Reactions   Triamterene Hives        Medication List     STOP taking these medications    lidocaine 5 % Commonly known as: Lidoderm   loratadine 10 MG tablet Commonly known as: CLARITIN       TAKE these medications    acetaminophen 325 MG tablet Commonly known as: TYLENOL Take 1 tablet (325 mg total) by mouth every 6 (six) hours as needed. What changed: how much to take   amLODipine 10 MG tablet Commonly known as: NORVASC Take 1 tablet (10 mg total) by mouth daily.  atorvastatin 20 MG tablet Commonly known as: LIPITOR Take 1 tablet (20 mg total) by mouth at bedtime.   feeding supplement Liqd Take 237 mLs by mouth 2 (two) times daily between meals.   hydrALAZINE 25 MG tablet Commonly known as: APRESOLINE Take 1 tablet (25 mg total) by mouth 3 (three) times daily as needed (for SBP > 160).   HYDROcodone-acetaminophen 5-325 MG tablet Commonly known as: NORCO/VICODIN Take 1 tablet by mouth every 6 (six) hours as needed for moderate pain.   melatonin 5 MG Tabs Take 1 tablet (5 mg total) by mouth at bedtime as needed.   multivitamin with minerals Tabs tablet Take 1 tablet by mouth daily.   polyethylene glycol 17 g packet Commonly known as: MIRALAX / GLYCOLAX Take 17 g by mouth daily as needed.        Allergies   Allergen Reactions   Triamterene Hives    Consultations: ORTHOPEDIC   Procedures/Studies: DG Chest 2 View  Result Date: 04/02/2021 CLINICAL DATA:  Status post fall with subsequent right-sided rib pain. EXAM: CHEST - 2 VIEW COMPARISON:  March 11, 2021 FINDINGS: The heart size and mediastinal contours are within normal limits. There is marked severity calcification of aortic arch and descending thoracic aorta. Both lungs are clear. Radiopaque surgical clips and vascular stent are seen overlying the midline of the upper abdomen. A chronic eighth left rib deformity is seen. The visualized skeletal structures are otherwise unremarkable. IMPRESSION: No active cardiopulmonary disease. Electronically Signed   By: Virgina Norfolk M.D.   On: 04/02/2021 15:50   DG Chest 2 View  Result Date: 03/11/2021 CLINICAL DATA:  Left upper shoulder pain. Additional provided: Generalized weakness. Patient reports history of CHF. EXAM: CHEST - 2 VIEW COMPARISON:  Prior chest radiographs 10/09/2019 and earlier. FINDINGS: Heart size within normal limits. Aortic atherosclerosis. No appreciable airspace consolidation or pulmonary edema. Minimal atelectasis versus scarring within the lateral right lung base. No evidence of pleural effusion or pneumothorax. No acute bony abnormality identified. Aortic stent graft within the partially imaged upper abdomen. IMPRESSION: Minimal atelectasis versus scarring within the lateral right lung base. Otherwise, no evidence of acute cardiopulmonary abnormality. Aortic Atherosclerosis (ICD10-I70.0). Electronically Signed   By: Kellie Simmering D.O.   On: 03/11/2021 13:46   MR PELVIS WO CONTRAST  Result Date: 04/03/2021 CLINICAL DATA:  Pelvic trauma.  Unable to bear weight. EXAM: MR OF THE RIGHT HIP WITHOUT CONTRAST MR OF THE PELVIS WITHOUT CONTRAST TECHNIQUE: Multiplanar, multisequence MR imaging of the pelvis was performed. No intravenous contrast was administered. Multiplanar,  multisequence MR imaging of the right hip was performed. No intravenous contrast was administered. COMPARISON:  CT abdomen/pelvis 12/30/2015 FINDINGS: Bones: No left hip fracture, dislocation or avascular necrosis. Right hip arthroplasty without hardware failure or complication. No periarticular fluid collection or osteolysis. Nondisplaced vertical fracture through the right sacral ala with surrounding marrow edema. Nondisplaced fracture of the right pubic body with surrounding bone marrow edema. Nondisplaced fracture of the right inferior pubic ramus. Nondisplaced fracture of the left superior pubic ramus-acetabular junction with surrounding bone marrow edema. No periosteal reaction or bone destruction. No aggressive osseous lesion. Mild osteoarthritis of bilateral SI joints. Disc desiccation at L4-5 and L5-S1 with mild broad-based disc bulges. Bilateral facet arthropathy at L4-5. Articular cartilage and labrum Articular cartilage: Right total hip arthroplasty. No focal chondral defect of the left hip. Labrum: Grossly intact, but evaluation is limited by lack of intraarticular fluid. Joint or bursal effusion Joint effusion:  No hip joint effusion.  No SI joint effusion. Bursae: Small amount of fluid in the right greater trochanteric bursa. Muscles and tendons Flexors: Normal. Extensors: Normal. Abductors: Normal. Adductors: Mild muscle edema in the right pectineus, obturator internus, obturator externus, piriformis and adductor magnus muscles. Gluteals: Normal. Hamstrings: Normal. Other findings No pelvic free fluid. No fluid collection or hematoma. No inguinal lymphadenopathy. No inguinal hernia. Multiple pelvic masses again noted with the largest measuring 5.9 cm , unchanged compared with 12/30/2015. IMPRESSION: 1. Right total hip arthroplasty without hardware failure or complication. 2. No left hip fracture or dislocation. 3. Nondisplaced vertical fracture through the right sacral ala with surrounding marrow  edema. 4. Nondisplaced fracture of the right pubic body with surrounding bone marrow edema. 5. Nondisplaced fracture of the right inferior pubic ramus. Nondisplaced fracture of the left superior pubic ramus-acetabular junction with surrounding bone marrow edema. 6. Mild muscle edema in the right pectineus, obturator internus, obturator externus, piriformis and adductor magnus muscles likely reflecting mild muscle strain. 7. Mild right greater trochanteric bursitis. 8. Multiple pelvic masses again noted with the largest measuring 5.9 cm likely reflecting uterine fibroids, unchanged compared with 12/30/2015. Electronically Signed   By: Kathreen Devoid M.D.   On: 04/03/2021 05:54   MR HIP RIGHT WO CONTRAST  Result Date: 04/03/2021 CLINICAL DATA:  Pelvic trauma.  Unable to bear weight. EXAM: MR OF THE RIGHT HIP WITHOUT CONTRAST MR OF THE PELVIS WITHOUT CONTRAST TECHNIQUE: Multiplanar, multisequence MR imaging of the pelvis was performed. No intravenous contrast was administered. Multiplanar, multisequence MR imaging of the right hip was performed. No intravenous contrast was administered. COMPARISON:  CT abdomen/pelvis 12/30/2015 FINDINGS: Bones: No left hip fracture, dislocation or avascular necrosis. Right hip arthroplasty without hardware failure or complication. No periarticular fluid collection or osteolysis. Nondisplaced vertical fracture through the right sacral ala with surrounding marrow edema. Nondisplaced fracture of the right pubic body with surrounding bone marrow edema. Nondisplaced fracture of the right inferior pubic ramus. Nondisplaced fracture of the left superior pubic ramus-acetabular junction with surrounding bone marrow edema. No periosteal reaction or bone destruction. No aggressive osseous lesion. Mild osteoarthritis of bilateral SI joints. Disc desiccation at L4-5 and L5-S1 with mild broad-based disc bulges. Bilateral facet arthropathy at L4-5. Articular cartilage and labrum Articular  cartilage: Right total hip arthroplasty. No focal chondral defect of the left hip. Labrum: Grossly intact, but evaluation is limited by lack of intraarticular fluid. Joint or bursal effusion Joint effusion:  No hip joint effusion.  No SI joint effusion. Bursae: Small amount of fluid in the right greater trochanteric bursa. Muscles and tendons Flexors: Normal. Extensors: Normal. Abductors: Normal. Adductors: Mild muscle edema in the right pectineus, obturator internus, obturator externus, piriformis and adductor magnus muscles. Gluteals: Normal. Hamstrings: Normal. Other findings No pelvic free fluid. No fluid collection or hematoma. No inguinal lymphadenopathy. No inguinal hernia. Multiple pelvic masses again noted with the largest measuring 5.9 cm , unchanged compared with 12/30/2015. IMPRESSION: 1. Right total hip arthroplasty without hardware failure or complication. 2. No left hip fracture or dislocation. 3. Nondisplaced vertical fracture through the right sacral ala with surrounding marrow edema. 4. Nondisplaced fracture of the right pubic body with surrounding bone marrow edema. 5. Nondisplaced fracture of the right inferior pubic ramus. Nondisplaced fracture of the left superior pubic ramus-acetabular junction with surrounding bone marrow edema. 6. Mild muscle edema in the right pectineus, obturator internus, obturator externus, piriformis and adductor magnus muscles likely reflecting mild muscle strain. 7. Mild right greater trochanteric bursitis. 8. Multiple pelvic masses  again noted with the largest measuring 5.9 cm likely reflecting uterine fibroids, unchanged compared with 12/30/2015. Electronically Signed   By: Kathreen Devoid M.D.   On: 04/03/2021 05:54   DG Hip Unilat  With Pelvis 2-3 Views Right  Result Date: 04/02/2021 CLINICAL DATA:  Status post fall with right hip pain. EXAM: DG HIP (WITH OR WITHOUT PELVIS) 2-3V RIGHT COMPARISON:  October 16, 2019 FINDINGS: A total right hip replacement is seen.  There is no evidence of surrounding lucency to suggest the presence of hardware loosening or infection. There is no evidence of hip fracture or dislocation. There is no evidence of arthropathy or other focal bone abnormality. Radiopaque vascular stents are seen overlying the lower lumbar spine. A lobulated calcified uterine fibroid is suspected. IMPRESSION: 1. No acute abnormality. 2. Right hip replacement without evidence of hardware complication. Electronically Signed   By: Virgina Norfolk M.D.   On: 04/02/2021 15:52     Subjective:  No significant events as D/W staff, patient reports her pain is controlled on current regmine  Discharge Exam: Vitals:   04/04/21 0049 04/04/21 0614  BP: (!) 178/75 (!) 174/59  Pulse: 70 64  Resp:  20  Temp:  (!) 97.3 F (36.3 C)  SpO2: 97% 96%   Vitals:   04/03/21 2328 04/04/21 0005 04/04/21 0049 04/04/21 0614  BP: (!) 185/75 (!) 185/54 (!) 178/75 (!) 174/59  Pulse: 62 61 70 64  Resp: 16   20  Temp: 97.8 F (36.6 C)   (!) 97.3 F (36.3 C)  TempSrc: Oral   Oral  SpO2: 100%  97% 96%  Weight:      Height:        General: Pt is alert, awake, not in acute distress Cardiovascular: RRR, S1/S2 +, no rubs, no gallops Respiratory: CTA bilaterally, no wheezing, no rhonchi Abdominal: Soft, NT, ND, bowel sounds + Extremities: no edema, no cyanosis    The results of significant diagnostics from this hospitalization (including imaging, microbiology, ancillary and laboratory) are listed below for reference.     Microbiology: Recent Results (from the past 240 hour(s))  Resp Panel by RT-PCR (Flu A&B, Covid) Nasopharyngeal Swab     Status: None   Collection Time: 04/03/21 12:45 AM   Specimen: Nasopharyngeal Swab; Nasopharyngeal(NP) swabs in vial transport medium  Result Value Ref Range Status   SARS Coronavirus 2 by RT PCR NEGATIVE NEGATIVE Final    Comment: (NOTE) SARS-CoV-2 target nucleic acids are NOT DETECTED.  The SARS-CoV-2 RNA is generally  detectable in upper respiratory specimens during the acute phase of infection. The lowest concentration of SARS-CoV-2 viral copies this assay can detect is 138 copies/mL. A negative result does not preclude SARS-Cov-2 infection and should not be used as the sole basis for treatment or other patient management decisions. A negative result may occur with  improper specimen collection/handling, submission of specimen other than nasopharyngeal swab, presence of viral mutation(s) within the areas targeted by this assay, and inadequate number of viral copies(<138 copies/mL). A negative result must be combined with clinical observations, patient history, and epidemiological information. The expected result is Negative.  Fact Sheet for Patients:  EntrepreneurPulse.com.au  Fact Sheet for Healthcare Providers:  IncredibleEmployment.be  This test is no t yet approved or cleared by the [place] FDA and  has been authorized for detection and/or diagnosis of SARS-CoV-2 by FDA under an Emergency Use Authorization (EUA). This EUA will remain  in effect (meaning this test can be used) for the duration of  the COVID-19 declaration under Section 564(b)(1) of the Act, 21 U.S.C.section 360bbb-3(b)(1), unless the authorization is terminated  or revoked sooner.       Influenza A by PCR NEGATIVE NEGATIVE Final   Influenza B by PCR NEGATIVE NEGATIVE Final    Comment: (NOTE) The Xpert Xpress SARS-CoV-2/FLU/RSV plus assay is intended as an aid in the diagnosis of influenza from Nasopharyngeal swab specimens and should not be used as a sole basis for treatment. Nasal washings and aspirates are unacceptable for Xpert Xpress SARS-CoV-2/FLU/RSV testing.  Fact Sheet for Patients: EntrepreneurPulse.com.au  Fact Sheet for Healthcare Providers: IncredibleEmployment.be  This test is not yet approved or cleared by the [place] FDA  and has been authorized for detection and/or diagnosis of SARS-CoV-2 by FDA under an Emergency Use Authorization (EUA). This EUA will remain in effect (meaning this test can be used) for the duration of the COVID-19 declaration under Section 564(b)(1) of the Act, 21 U.S.C. section 360bbb-3(b)(1), unless the authorization is terminated or revoked.  Performed at [facility] [address] [place], [place] [address]      Labs: BNP (last 3 results) No results for input(s): BNP in the last 8760 hours. Basic Metabolic Panel: Recent Labs  Lab 04/03/21 0045  NA 138  K 3.6  CL 107  CO2 23  GLUCOSE 181*  BUN 27*  CREATININE 1.71*  CALCIUM 9.5   Liver Function Tests: No results for input(s): AST, ALT, ALKPHOS, BILITOT, PROT, ALBUMIN in the last 168 hours. No results for input(s): LIPASE, AMYLASE in the last 168 hours. No results for input(s): AMMONIA in the last 168 hours. CBC: Recent Labs  Lab 04/03/21 0045  WBC 16.9*  NEUTROABS 4.1  HGB 10.5*  HCT 31.6*  MCV 105.7*  PLT 116*   Cardiac Enzymes: No results for input(s): CKTOTAL, CKMB, CKMBINDEX, TROPONINI in the last 168 hours. BNP: Invalid input(s): POCBNP CBG: Recent Labs  Lab 04/03/21 1140 04/03/21 1636 04/03/21 2013  GLUCAP 165* 91 108*   D-Dimer No results for input(s): DDIMER in the last 72 hours. Hgb A1c Recent Labs    04/03/21 0756  HGBA1C 4.9   Lipid Profile No results for input(s): CHOL, HDL, LDLCALC, TRIG, CHOLHDL, LDLDIRECT in the last 72 hours. Thyroid function studies No results for input(s): TSH, T4TOTAL, T3FREE, THYROIDAB in the last 72 hours.  Invalid input(s): FREET3 Anemia work up No results for input(s): VITAMINB12, FOLATE, FERRITIN, TIBC, IRON, RETICCTPCT in the last 72 hours. Urinalysis    Component Value Date/Time   COLORURINE YELLOW (A) 10/09/2019 1559   APPEARANCEUR CLEAR (A) 10/09/2019 1559   APPEARANCEUR Clear 08/21/2014 1141   LABSPEC 1.009 10/09/2019 1559    LABSPEC 1.017 08/21/2014 1141   PHURINE 7.0 10/09/2019 1559   GLUCOSEU NEGATIVE 10/09/2019 1559   GLUCOSEU Negative 08/21/2014 1141   HGBUR NEGATIVE 10/09/2019 1559   BILIRUBINUR NEGATIVE 10/09/2019 1559   BILIRUBINUR Negative 08/21/2014 1141   KETONESUR NEGATIVE 10/09/2019 1559   PROTEINUR 100 (A) 10/09/2019 1559   UROBILINOGEN negative 01/26/2012 1215   NITRITE NEGATIVE 10/09/2019 1559   LEUKOCYTESUR NEGATIVE 10/09/2019 1559   LEUKOCYTESUR Negative 08/21/2014 1141   Sepsis Labs Invalid input(s): PROCALCITONIN,  WBC,  LACTICIDVEN Microbiology Recent Results (from the past 240 hour(s))  Resp Panel by RT-PCR (Flu A&B, Covid) Nasopharyngeal Swab     Status: None   Collection Time: 04/03/21 12:45 AM   Specimen: Nasopharyngeal Swab; Nasopharyngeal(NP) swabs in vial transport medium  Result Value Ref Range Status   SARS Coronavirus 2  by RT PCR NEGATIVE NEGATIVE Final    Comment: (NOTE) SARS-CoV-2 target nucleic acids are NOT DETECTED.  The SARS-CoV-2 RNA is generally detectable in upper respiratory specimens during the acute phase of infection. The lowest concentration of SARS-CoV-2 viral copies this assay can detect is 138 copies/mL. A negative result does not preclude SARS-Cov-2 infection and should not be used as the sole basis for treatment or other patient management decisions. A negative result may occur with  improper specimen collection/handling, submission of specimen other than nasopharyngeal swab, presence of viral mutation(s) within the areas targeted by this assay, and inadequate number of viral copies(<138 copies/mL). A negative result must be combined with clinical observations, patient history, and epidemiological information. The expected result is Negative.  Fact Sheet for Patients:  EntrepreneurPulse.com.au  Fact Sheet for Healthcare Providers:  IncredibleEmployment.be  This test is no t yet approved or cleared by the  [place] FDA and  has been authorized for detection and/or diagnosis of SARS-CoV-2 by FDA under an Emergency Use Authorization (EUA). This EUA will remain  in effect (meaning this test can be used) for the duration of the COVID-19 declaration under Section 564(b)(1) of the Act, 21 U.S.C.section 360bbb-3(b)(1), unless the authorization is terminated  or revoked sooner.       Influenza A by PCR NEGATIVE NEGATIVE Final   Influenza B by PCR NEGATIVE NEGATIVE Final    Comment: (NOTE) The Xpert Xpress SARS-CoV-2/FLU/RSV plus assay is intended as an aid in the diagnosis of influenza from Nasopharyngeal swab specimens and should not be used as a sole basis for treatment. Nasal washings and aspirates are unacceptable for Xpert Xpress SARS-CoV-2/FLU/RSV testing.  Fact Sheet for Patients: EntrepreneurPulse.com.au  Fact Sheet for Healthcare Providers: IncredibleEmployment.be  This test is not yet approved or cleared by the [place] FDA and has been authorized for detection and/or diagnosis of SARS-CoV-2 by FDA under an Emergency Use Authorization (EUA). This EUA will remain in effect (meaning this test can be used) for the duration of the COVID-19 declaration under Section 564(b)(1) of the Act, 21 U.S.C. section 360bbb-3(b)(1), unless the authorization is terminated or revoked.  Performed at [facility], [address], [place], [place] [address]      Time coordinating discharge: Over 30 minutes  SIGNED:   Phillips Climes, MD  [facility] 04/04/2021, 8:00 AM Pager   If 7PM-7AM, please contact night-coverage [email or web address] Password [email or web address]

## 2021-04-04 NOTE — Progress Notes (Signed)
Transport picked patient up, discharged to [facility].

## 2021-04-04 NOTE — Progress Notes (Signed)
Report given to Elane Fritz, Therapist, sports at [facility].

## 2021-04-06 DIAGNOSIS — I10 Essential (primary) hypertension: Secondary | ICD-10-CM | POA: Diagnosis not present

## 2021-04-06 DIAGNOSIS — C9110 Chronic lymphocytic leukemia of B-cell type not having achieved remission: Secondary | ICD-10-CM | POA: Diagnosis not present

## 2021-04-06 DIAGNOSIS — D696 Thrombocytopenia, unspecified: Secondary | ICD-10-CM | POA: Diagnosis not present

## 2021-04-06 DIAGNOSIS — J449 Chronic obstructive pulmonary disease, unspecified: Secondary | ICD-10-CM | POA: Diagnosis not present

## 2021-04-06 DIAGNOSIS — I5032 Chronic diastolic (congestive) heart failure: Secondary | ICD-10-CM | POA: Diagnosis not present

## 2021-04-06 DIAGNOSIS — S329XXA Fracture of unspecified parts of lumbosacral spine and pelvis, initial encounter for closed fracture: Secondary | ICD-10-CM | POA: Diagnosis not present

## 2021-04-13 DIAGNOSIS — R300 Dysuria: Secondary | ICD-10-CM | POA: Diagnosis not present

## 2021-04-13 DIAGNOSIS — I10 Essential (primary) hypertension: Secondary | ICD-10-CM | POA: Diagnosis not present

## 2021-04-20 DIAGNOSIS — J449 Chronic obstructive pulmonary disease, unspecified: Secondary | ICD-10-CM

## 2021-04-20 DIAGNOSIS — I739 Peripheral vascular disease, unspecified: Secondary | ICD-10-CM

## 2021-04-20 DIAGNOSIS — S329XXD Fracture of unspecified parts of lumbosacral spine and pelvis, subsequent encounter for fracture with routine healing: Secondary | ICD-10-CM

## 2021-04-20 DIAGNOSIS — I10 Essential (primary) hypertension: Secondary | ICD-10-CM

## 2021-04-20 DIAGNOSIS — I5032 Chronic diastolic (congestive) heart failure: Secondary | ICD-10-CM

## 2021-04-22 DIAGNOSIS — I739 Peripheral vascular disease, unspecified: Secondary | ICD-10-CM | POA: Diagnosis not present

## 2021-04-22 DIAGNOSIS — R278 Other lack of coordination: Secondary | ICD-10-CM | POA: Diagnosis not present

## 2021-04-22 DIAGNOSIS — I130 Hypertensive heart and chronic kidney disease with heart failure and stage 1 through stage 4 chronic kidney disease, or unspecified chronic kidney disease: Secondary | ICD-10-CM | POA: Diagnosis not present

## 2021-04-22 DIAGNOSIS — R2681 Unsteadiness on feet: Secondary | ICD-10-CM | POA: Diagnosis not present

## 2021-04-22 DIAGNOSIS — R4189 Other symptoms and signs involving cognitive functions and awareness: Secondary | ICD-10-CM | POA: Diagnosis not present

## 2021-04-22 DIAGNOSIS — Z741 Need for assistance with personal care: Secondary | ICD-10-CM | POA: Diagnosis not present

## 2021-04-22 DIAGNOSIS — S32591D Other specified fracture of right pubis, subsequent encounter for fracture with routine healing: Secondary | ICD-10-CM | POA: Diagnosis not present

## 2021-04-22 DIAGNOSIS — M6281 Muscle weakness (generalized): Secondary | ICD-10-CM | POA: Diagnosis not present

## 2021-04-22 DIAGNOSIS — J449 Chronic obstructive pulmonary disease, unspecified: Secondary | ICD-10-CM | POA: Diagnosis not present

## 2021-04-22 DIAGNOSIS — I5032 Chronic diastolic (congestive) heart failure: Secondary | ICD-10-CM | POA: Diagnosis not present

## 2021-04-22 DIAGNOSIS — S32511D Fracture of superior rim of right pubis, subsequent encounter for fracture with routine healing: Secondary | ICD-10-CM | POA: Diagnosis not present

## 2021-04-23 DIAGNOSIS — S32511D Fracture of superior rim of right pubis, subsequent encounter for fracture with routine healing: Secondary | ICD-10-CM | POA: Diagnosis not present

## 2021-04-23 DIAGNOSIS — I130 Hypertensive heart and chronic kidney disease with heart failure and stage 1 through stage 4 chronic kidney disease, or unspecified chronic kidney disease: Secondary | ICD-10-CM | POA: Diagnosis not present

## 2021-04-23 DIAGNOSIS — I5032 Chronic diastolic (congestive) heart failure: Secondary | ICD-10-CM | POA: Diagnosis not present

## 2021-04-23 DIAGNOSIS — I739 Peripheral vascular disease, unspecified: Secondary | ICD-10-CM | POA: Diagnosis not present

## 2021-04-23 DIAGNOSIS — S32591D Other specified fracture of right pubis, subsequent encounter for fracture with routine healing: Secondary | ICD-10-CM | POA: Diagnosis not present

## 2021-04-23 DIAGNOSIS — J449 Chronic obstructive pulmonary disease, unspecified: Secondary | ICD-10-CM | POA: Diagnosis not present

## 2021-04-24 ENCOUNTER — Ambulatory Visit (INDEPENDENT_AMBULATORY_CARE_PROVIDER_SITE_OTHER): Payer: Medicare Other | Admitting: Internal Medicine

## 2021-04-24 ENCOUNTER — Encounter: Payer: Self-pay | Admitting: Internal Medicine

## 2021-04-24 ENCOUNTER — Other Ambulatory Visit: Payer: Self-pay

## 2021-04-24 DIAGNOSIS — E441 Mild protein-calorie malnutrition: Secondary | ICD-10-CM

## 2021-04-24 DIAGNOSIS — D492 Neoplasm of unspecified behavior of bone, soft tissue, and skin: Secondary | ICD-10-CM

## 2021-04-24 DIAGNOSIS — S32401A Unspecified fracture of right acetabulum, initial encounter for closed fracture: Secondary | ICD-10-CM | POA: Diagnosis not present

## 2021-04-24 DIAGNOSIS — I10 Essential (primary) hypertension: Secondary | ICD-10-CM | POA: Diagnosis not present

## 2021-04-24 DIAGNOSIS — S32401S Unspecified fracture of right acetabulum, sequela: Secondary | ICD-10-CM

## 2021-04-24 DIAGNOSIS — I5032 Chronic diastolic (congestive) heart failure: Secondary | ICD-10-CM | POA: Diagnosis not present

## 2021-04-24 DIAGNOSIS — D696 Thrombocytopenia, unspecified: Secondary | ICD-10-CM

## 2021-04-24 DIAGNOSIS — J449 Chronic obstructive pulmonary disease, unspecified: Secondary | ICD-10-CM

## 2021-04-24 MED ORDER — AMLODIPINE BESYLATE 5 MG PO TABS: 5.0000 mg | ORAL_TABLET | Freq: Every day | ORAL | 3 refills | Status: DC

## 2021-04-24 MED ORDER — LOSARTAN POTASSIUM 100 MG PO TABS: 100.0000 mg | ORAL_TABLET | Freq: Every day | ORAL | 3 refills | Status: DC

## 2021-04-24 NOTE — Assessment & Plan Note (Signed)
Compensated without diuretics Is now back on the losartan

## 2021-04-24 NOTE — Assessment & Plan Note (Signed)
Stable function No meds Did stop smoking

## 2021-04-24 NOTE — Assessment & Plan Note (Signed)
Has improved Back home with episodic aide help (showers, shopping, etc) Not using the hydrocodone now Has started home therapy

## 2021-04-24 NOTE — Assessment & Plan Note (Signed)
Will refer back to Dr Wells Guiles couldn't get in for a few months

## 2021-04-24 NOTE — Assessment & Plan Note (Signed)
Has managed to gain back 2 pounds but still 10# underweight Urged her to keep the prepared meals--I don't think she was cooking

## 2021-04-24 NOTE — Assessment & Plan Note (Signed)
BP Readings from Last 3 Encounters:  04/24/21 136/68  04/04/21 (!) 180/59  03/11/21 (!) 162/71   Now back controlled again on amlodipine and losartan

## 2021-04-24 NOTE — Assessment & Plan Note (Signed)
Mildly low No abnormal bleeding but bruises easily

## 2021-04-24 NOTE — Progress Notes (Signed)
Subjective:    Patient ID: Tiffany Martinez, female    DOB: [birth year], 87 y.o.   MRN: [id number]  HPI Here for follow up after hospitalization for pelvic fracture--then brief rehab  Got home from rehab 3 days ago---stressed because the aide wasn't there to do the shopping Had to get meal from [facility] then the next day They had planned daily aide--but she is only wanting a few days (to help with groceries and heavy housework) Scared of showering--does sponge bath unless aide is there  Walks with her rollator--being very careful (had fallen when rollator slipped off curb and took her with it) No longer needs to hydrocodone (but still has a few)  BP meds changed in hospital --and then I changed them again Now on amlodipine 5mg  (not 10) and back on losartan---increased from 25-50 then 100 No chest pain No SOB Does feel "a heavier heartbeat" ---usually in bed at rest and mild  Last platelet count 116K Bruises easily  Current Outpatient Medications on File Prior to Visit  Medication Sig Dispense Refill   atorvastatin (LIPITOR) 20 MG tablet Take 1 tablet (20 mg total) by mouth at bedtime.     lactose free nutrition (BOOST) LIQD Take 237 mLs by mouth daily as needed.     No current facility-administered medications on file prior to visit.    Allergies  Allergen Reactions   Triamterene Hives    Past Medical History:  Diagnosis Date   AAA (abdominal aortic aneurysm) 2012   3.9cm, rec rpt 1 yr   Anemia    Anxiety    CHF (congestive heart failure) (HCC)    Chronic venous insufficiency    CLL (chronic lymphocytic leukemia) ([place]) 2014   Stage 0--by flow cytometry   Hx of colonoscopy    Hyperlipidemia    Hypertension    Melanoma ([place])    Right Arm   Migraine headache    3-4X/yr   PAD (peripheral artery disease) ([place]) 2012   severe R common iliac stenosis, ABI 0.73, mid L common iliac stenosis ABI 1.1   PAD (peripheral artery disease) ([place])    Wears dentures    full  upper and lower    Past Surgical History:  Procedure Laterality Date   ABDOMINAL AORTIC ANEURYSM REPAIR  02/16/12   [facility]--Dr Sammuel Hines   CARDIOVASCULAR STRESS TEST  2008   negative   CATARACT EXTRACTION W/PHACO Left 07/16/2019   Procedure: CATARACT EXTRACTION PHACO AND INTRAOCULAR LENS PLACEMENT (IOC) LEFT 8.26  00:46.6;  Surgeon: Eulogio Bear, MD;  Location: [place];  Service: Ophthalmology;  Laterality: Left;   DOBUTAMINE STRESS ECHO  11/11   negative   HIP ARTHROPLASTY Right 10/10/2019   Procedure: ARTHROPLASTY BIPOLAR HIP (HEMIARTHROPLASTY);  Surgeon: Corky Mull, MD;  Location: [facility];  Service: Orthopedics;  Laterality: Right;   KNEE ARTHROSCOPY  1998   right   MELANOMA EXCISION  11/09/10   wide excision right upper arm   SQUAMOUS CELL CARCINOMA EXCISION  02/2016   TONSILLECTOMY AND ADENOIDECTOMY  1960    Family History  Problem Relation Age of Onset   Alzheimer's disease Mother    Pancreatic cancer Father    Parkinson's disease Brother    Coronary artery disease Neg Hx    Diabetes Neg Hx    Cancer Neg Hx        breast or colon    Social History   Socioeconomic History   Marital status: Widowed    Spouse name: Not  on file   Number of children: 4   Years of education: Not on file   Highest education level: Not on file  Occupational History   Occupation: retired- Therapist, sports,   Tobacco Use   Smoking status: Former    Types: Cigarettes    Quit date: 03/17/2021    Years since quitting: 0.1   Smokeless tobacco: Never   Tobacco comments:    occassionally  Vaping Use   Vaping Use: Never used  Substance and Sexual Activity   Alcohol use: Yes    Alcohol/week: 7.0 standard drinks    Types: 7 Glasses of wine per week    Comment: regular wine with dinner   Drug use: No   Sexual activity: Never  Other Topics Concern   Not on file  Social History Narrative   Widowed 2/11 -2nd for him , 1st for her. 4 step sons. 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         Has living will and health care POA--changing her POA at this point   Requests DNR   Discussed MOST form---but would probably accept IV fluids/antibiotics, etc   Requests no feeding tube         Social Determinants of Health   Financial Resource Strain: Not on file  Food Insecurity: Not on file  Transportation Needs: Not on file  Physical Activity: Not on file  Stress: Not on file  Social Connections: Not on file  Intimate Partner Violence: Not on file   Review of Systems Eating well ---prepared meals still Not sleeping well Didn't restart smoking     Objective:   Physical Exam Constitutional:      Appearance: Normal appearance.  Cardiovascular:     Rate and Rhythm: Normal rate and regular rhythm.     Heart sounds: No murmur heard.   No gallop.  Pulmonary:     Effort: Pulmonary effort is normal.     Breath sounds: No wheezing or rales.     Comments: Decreased breath sounds but clear Musculoskeletal:     Cervical back: Neck supple.  Lymphadenopathy:     Cervical: No cervical adenopathy.  Skin:    Comments: 24mm neoplasm on right calf  Neurological:     Mental Status: She is alert.           Assessment & Plan:

## 2021-04-27 DIAGNOSIS — J449 Chronic obstructive pulmonary disease, unspecified: Secondary | ICD-10-CM | POA: Diagnosis not present

## 2021-04-27 DIAGNOSIS — S32591D Other specified fracture of right pubis, subsequent encounter for fracture with routine healing: Secondary | ICD-10-CM | POA: Diagnosis not present

## 2021-04-27 DIAGNOSIS — S32511D Fracture of superior rim of right pubis, subsequent encounter for fracture with routine healing: Secondary | ICD-10-CM | POA: Diagnosis not present

## 2021-04-27 DIAGNOSIS — I5032 Chronic diastolic (congestive) heart failure: Secondary | ICD-10-CM | POA: Diagnosis not present

## 2021-04-27 DIAGNOSIS — I739 Peripheral vascular disease, unspecified: Secondary | ICD-10-CM | POA: Diagnosis not present

## 2021-04-27 DIAGNOSIS — I130 Hypertensive heart and chronic kidney disease with heart failure and stage 1 through stage 4 chronic kidney disease, or unspecified chronic kidney disease: Secondary | ICD-10-CM | POA: Diagnosis not present

## 2021-04-30 DIAGNOSIS — J449 Chronic obstructive pulmonary disease, unspecified: Secondary | ICD-10-CM | POA: Diagnosis not present

## 2021-04-30 DIAGNOSIS — S32511D Fracture of superior rim of right pubis, subsequent encounter for fracture with routine healing: Secondary | ICD-10-CM | POA: Diagnosis not present

## 2021-04-30 DIAGNOSIS — I5032 Chronic diastolic (congestive) heart failure: Secondary | ICD-10-CM | POA: Diagnosis not present

## 2021-04-30 DIAGNOSIS — S32591D Other specified fracture of right pubis, subsequent encounter for fracture with routine healing: Secondary | ICD-10-CM | POA: Diagnosis not present

## 2021-04-30 DIAGNOSIS — I739 Peripheral vascular disease, unspecified: Secondary | ICD-10-CM | POA: Diagnosis not present

## 2021-04-30 DIAGNOSIS — I130 Hypertensive heart and chronic kidney disease with heart failure and stage 1 through stage 4 chronic kidney disease, or unspecified chronic kidney disease: Secondary | ICD-10-CM | POA: Diagnosis not present

## 2021-05-04 DIAGNOSIS — J449 Chronic obstructive pulmonary disease, unspecified: Secondary | ICD-10-CM | POA: Diagnosis not present

## 2021-05-04 DIAGNOSIS — S32591D Other specified fracture of right pubis, subsequent encounter for fracture with routine healing: Secondary | ICD-10-CM | POA: Diagnosis not present

## 2021-05-04 DIAGNOSIS — I130 Hypertensive heart and chronic kidney disease with heart failure and stage 1 through stage 4 chronic kidney disease, or unspecified chronic kidney disease: Secondary | ICD-10-CM | POA: Diagnosis not present

## 2021-05-04 DIAGNOSIS — S32511D Fracture of superior rim of right pubis, subsequent encounter for fracture with routine healing: Secondary | ICD-10-CM | POA: Diagnosis not present

## 2021-05-04 DIAGNOSIS — I739 Peripheral vascular disease, unspecified: Secondary | ICD-10-CM | POA: Diagnosis not present

## 2021-05-04 DIAGNOSIS — I5032 Chronic diastolic (congestive) heart failure: Secondary | ICD-10-CM | POA: Diagnosis not present

## 2021-05-05 DIAGNOSIS — J449 Chronic obstructive pulmonary disease, unspecified: Secondary | ICD-10-CM | POA: Diagnosis not present

## 2021-05-05 DIAGNOSIS — I130 Hypertensive heart and chronic kidney disease with heart failure and stage 1 through stage 4 chronic kidney disease, or unspecified chronic kidney disease: Secondary | ICD-10-CM | POA: Diagnosis not present

## 2021-05-05 DIAGNOSIS — I5032 Chronic diastolic (congestive) heart failure: Secondary | ICD-10-CM | POA: Diagnosis not present

## 2021-05-05 DIAGNOSIS — S32591D Other specified fracture of right pubis, subsequent encounter for fracture with routine healing: Secondary | ICD-10-CM | POA: Diagnosis not present

## 2021-05-05 DIAGNOSIS — S32511D Fracture of superior rim of right pubis, subsequent encounter for fracture with routine healing: Secondary | ICD-10-CM | POA: Diagnosis not present

## 2021-05-05 DIAGNOSIS — I739 Peripheral vascular disease, unspecified: Secondary | ICD-10-CM | POA: Diagnosis not present

## 2021-05-06 DIAGNOSIS — S32511D Fracture of superior rim of right pubis, subsequent encounter for fracture with routine healing: Secondary | ICD-10-CM | POA: Diagnosis not present

## 2021-05-06 DIAGNOSIS — I130 Hypertensive heart and chronic kidney disease with heart failure and stage 1 through stage 4 chronic kidney disease, or unspecified chronic kidney disease: Secondary | ICD-10-CM | POA: Diagnosis not present

## 2021-05-06 DIAGNOSIS — S32591D Other specified fracture of right pubis, subsequent encounter for fracture with routine healing: Secondary | ICD-10-CM | POA: Diagnosis not present

## 2021-05-06 DIAGNOSIS — I5032 Chronic diastolic (congestive) heart failure: Secondary | ICD-10-CM | POA: Diagnosis not present

## 2021-05-06 DIAGNOSIS — J449 Chronic obstructive pulmonary disease, unspecified: Secondary | ICD-10-CM | POA: Diagnosis not present

## 2021-05-06 DIAGNOSIS — I739 Peripheral vascular disease, unspecified: Secondary | ICD-10-CM | POA: Diagnosis not present

## 2021-05-19 DIAGNOSIS — R2681 Unsteadiness on feet: Secondary | ICD-10-CM | POA: Diagnosis not present

## 2021-05-19 DIAGNOSIS — Z741 Need for assistance with personal care: Secondary | ICD-10-CM | POA: Diagnosis not present

## 2021-05-19 DIAGNOSIS — I130 Hypertensive heart and chronic kidney disease with heart failure and stage 1 through stage 4 chronic kidney disease, or unspecified chronic kidney disease: Secondary | ICD-10-CM | POA: Diagnosis not present

## 2021-05-19 DIAGNOSIS — S32511D Fracture of superior rim of right pubis, subsequent encounter for fracture with routine healing: Secondary | ICD-10-CM | POA: Diagnosis not present

## 2021-05-19 DIAGNOSIS — I739 Peripheral vascular disease, unspecified: Secondary | ICD-10-CM | POA: Diagnosis not present

## 2021-05-19 DIAGNOSIS — I5032 Chronic diastolic (congestive) heart failure: Secondary | ICD-10-CM | POA: Diagnosis not present

## 2021-05-19 DIAGNOSIS — R4189 Other symptoms and signs involving cognitive functions and awareness: Secondary | ICD-10-CM | POA: Diagnosis not present

## 2021-05-19 DIAGNOSIS — M6281 Muscle weakness (generalized): Secondary | ICD-10-CM | POA: Diagnosis not present

## 2021-05-19 DIAGNOSIS — R278 Other lack of coordination: Secondary | ICD-10-CM | POA: Diagnosis not present

## 2021-05-19 DIAGNOSIS — J449 Chronic obstructive pulmonary disease, unspecified: Secondary | ICD-10-CM | POA: Diagnosis not present

## 2021-05-19 DIAGNOSIS — S32591D Other specified fracture of right pubis, subsequent encounter for fracture with routine healing: Secondary | ICD-10-CM | POA: Diagnosis not present

## 2021-05-21 DIAGNOSIS — I130 Hypertensive heart and chronic kidney disease with heart failure and stage 1 through stage 4 chronic kidney disease, or unspecified chronic kidney disease: Secondary | ICD-10-CM | POA: Diagnosis not present

## 2021-05-21 DIAGNOSIS — I5032 Chronic diastolic (congestive) heart failure: Secondary | ICD-10-CM | POA: Diagnosis not present

## 2021-05-21 DIAGNOSIS — S32511D Fracture of superior rim of right pubis, subsequent encounter for fracture with routine healing: Secondary | ICD-10-CM | POA: Diagnosis not present

## 2021-05-21 DIAGNOSIS — S32591D Other specified fracture of right pubis, subsequent encounter for fracture with routine healing: Secondary | ICD-10-CM | POA: Diagnosis not present

## 2021-05-21 DIAGNOSIS — I739 Peripheral vascular disease, unspecified: Secondary | ICD-10-CM | POA: Diagnosis not present

## 2021-05-21 DIAGNOSIS — J449 Chronic obstructive pulmonary disease, unspecified: Secondary | ICD-10-CM | POA: Diagnosis not present

## 2021-06-11 ENCOUNTER — Emergency Department: Payer: Medicare Other

## 2021-06-11 ENCOUNTER — Other Ambulatory Visit: Payer: Self-pay

## 2021-06-11 ENCOUNTER — Encounter: Payer: Self-pay | Admitting: Emergency Medicine

## 2021-06-11 ENCOUNTER — Emergency Department
Admission: EM | Admit: 2021-06-11 | Discharge: 2021-06-11 | Payer: Medicare Other | Attending: Student in an Organized Health Care Education/Training Program | Admitting: Student in an Organized Health Care Education/Training Program

## 2021-06-11 DIAGNOSIS — R1084 Generalized abdominal pain: Secondary | ICD-10-CM | POA: Diagnosis not present

## 2021-06-11 DIAGNOSIS — Z20822 Contact with and (suspected) exposure to covid-19: Secondary | ICD-10-CM | POA: Diagnosis not present

## 2021-06-11 DIAGNOSIS — R0789 Other chest pain: Secondary | ICD-10-CM | POA: Diagnosis not present

## 2021-06-11 DIAGNOSIS — R112 Nausea with vomiting, unspecified: Secondary | ICD-10-CM | POA: Insufficient documentation

## 2021-06-11 DIAGNOSIS — I10 Essential (primary) hypertension: Secondary | ICD-10-CM | POA: Insufficient documentation

## 2021-06-11 DIAGNOSIS — R0602 Shortness of breath: Secondary | ICD-10-CM | POA: Insufficient documentation

## 2021-06-11 DIAGNOSIS — R110 Nausea: Secondary | ICD-10-CM | POA: Diagnosis not present

## 2021-06-11 DIAGNOSIS — R52 Pain, unspecified: Secondary | ICD-10-CM | POA: Diagnosis not present

## 2021-06-11 DIAGNOSIS — D72829 Elevated white blood cell count, unspecified: Secondary | ICD-10-CM | POA: Insufficient documentation

## 2021-06-11 DIAGNOSIS — R051 Acute cough: Secondary | ICD-10-CM | POA: Diagnosis not present

## 2021-06-11 DIAGNOSIS — R001 Bradycardia, unspecified: Secondary | ICD-10-CM | POA: Diagnosis not present

## 2021-06-11 LAB — URINALYSIS, COMPLETE (UACMP) WITH MICROSCOPIC
Bilirubin Urine: NEGATIVE
Glucose, UA: NEGATIVE mg/dL
Hgb urine dipstick: NEGATIVE
Ketones, ur: NEGATIVE mg/dL
Leukocytes,Ua: NEGATIVE
Nitrite: NEGATIVE
Protein, ur: 100 mg/dL — AB
Specific Gravity, Urine: 1.02 (ref 1.005–1.030)
pH: 5.5 (ref 5.0–8.0)

## 2021-06-11 LAB — COMPREHENSIVE METABOLIC PANEL
ALT: 13 U/L (ref 0–44)
AST: 24 U/L (ref 15–41)
Albumin: 3.6 g/dL (ref 3.5–5.0)
Alkaline Phosphatase: 78 U/L (ref 38–126)
Anion gap: 7 (ref 5–15)
BUN: 34 mg/dL — ABNORMAL HIGH (ref 8–23)
CO2: 25 mmol/L (ref 22–32)
Calcium: 10.2 mg/dL (ref 8.9–10.3)
Chloride: 104 mmol/L (ref 98–111)
Creatinine, Ser: 1.56 mg/dL — ABNORMAL HIGH (ref 0.44–1.00)
GFR, Estimated: 32 mL/min — ABNORMAL LOW (ref >60)
Glucose, Bld: 136 mg/dL — ABNORMAL HIGH (ref 70–99)
Potassium: 3.8 mmol/L (ref 3.5–5.1)
Sodium: 136 mmol/L (ref 135–145)
Total Bilirubin: 0.5 mg/dL (ref 0.3–1.2)
Total Protein: 6.7 g/dL (ref 6.5–8.1)

## 2021-06-11 LAB — CBC
HCT: 29.4 % — ABNORMAL LOW (ref 36.0–46.0)
Hemoglobin: 10.8 g/dL — ABNORMAL LOW (ref 12.0–15.0)
MCH: 40.1 pg — ABNORMAL HIGH (ref 26.0–34.0)
MCHC: 36.7 g/dL — ABNORMAL HIGH (ref 30.0–36.0)
MCV: 109.3 fL — ABNORMAL HIGH (ref 80.0–100.0)
Platelets: 180 10*3/uL (ref 150–400)
RBC: 2.69 MIL/uL — ABNORMAL LOW (ref 3.87–5.11)
RDW: 16.9 % — ABNORMAL HIGH (ref 11.5–15.5)
WBC: 15.6 10*3/uL — ABNORMAL HIGH (ref 4.0–10.5)
nRBC: 0 % (ref 0.0–0.2)

## 2021-06-11 LAB — LIPASE, BLOOD: Lipase: 40 U/L (ref 11–51)

## 2021-06-11 LAB — TROPONIN I (HIGH SENSITIVITY)
Troponin I (High Sensitivity): 15 ng/L (ref <18)
Troponin I (High Sensitivity): 15 ng/L (ref <18)

## 2021-06-11 LAB — BRAIN NATRIURETIC PEPTIDE: B Natriuretic Peptide: 216.6 pg/mL — ABNORMAL HIGH (ref 0.0–100.0)

## 2021-06-11 LAB — RESP PANEL BY RT-PCR (FLU A&B, COVID) ARPGX2
Influenza A by PCR: NEGATIVE
Influenza B by PCR: NEGATIVE
SARS Coronavirus 2 by RT PCR: NEGATIVE

## 2021-06-11 LAB — LACTIC ACID, PLASMA: Lactic Acid, Venous: 0.8 mmol/L (ref 0.5–1.9)

## 2021-06-11 MED ORDER — AZITHROMYCIN 250 MG PO TABS: 250.0000 mg | ORAL_TABLET | Freq: Every day | ORAL | 0 refills | Status: DC

## 2021-06-11 MED ORDER — ONDANSETRON HCL 4 MG PO TABS: 4.0000 mg | ORAL_TABLET | Freq: Three times a day (TID) | ORAL | 0 refills | Status: DC | PRN

## 2021-06-11 MED ORDER — AZITHROMYCIN 500 MG PO TABS
500.0000 mg | ORAL_TABLET | Freq: Once | ORAL | Status: AC
  Administered 2021-06-11: 500 mg via ORAL
  Filled 2021-06-11: qty 1

## 2021-06-11 MED ORDER — CEPHALEXIN 500 MG PO CAPS
500.0000 mg | ORAL_CAPSULE | Freq: Once | ORAL | Status: AC
  Administered 2021-06-11: 500 mg via ORAL
  Filled 2021-06-11: qty 1

## 2021-06-11 MED ORDER — CEPHALEXIN 500 MG PO CAPS: 500.0000 mg | ORAL_CAPSULE | Freq: Two times a day (BID) | ORAL | 0 refills | Status: DC

## 2021-06-11 NOTE — ED Notes (Signed)
Resting. Awake and reading a book. Denies feeling nauseated at this time. Eaten sandwich and has been able to keep it done. No complaints of pain. Sinus Rhythm per cardiac monitor. A&Ox4. Skin p/w/d. RR even and non-labored. RA.

## 2021-06-11 NOTE — ED Triage Notes (Signed)
Pt coming from [facility] via [facility]. Per EMS, pt has been c/o of nausea with vomiting since this morning. Pt denies any diarrhea nor blood in stool. EMS gave pt 4mg  of zofran due to pt vomiting in route 3-4 times.

## 2021-06-11 NOTE — ED Notes (Signed)
This RN spoke with nurse at [facility] who confirmed that security will come transport patient back to residence.  Security number for future [phone]

## 2021-06-11 NOTE — ED Provider Notes (Signed)
[facility] Provider Note    Event Date/Time   First MD Initiated Contact with Patient 06/11/21 1613     (approximate)   History   Nausea and Emesis   HPI  Tiffany Martinez is a 88 y.o. female who presents to the ER for evaluation of nausea and vomiting as well as some chest discomfort.  States she has a history of bradycardia and hypertension.  Feels very short of breath particular with any exertion over the past few days.  She did endorse abdominal pain earlier with vomiting but she denies any abdominal pain at this time.  No pain in her back.  No pain with deep inspiration.     Physical Exam   Triage Vital Signs: ED Triage Vitals  Enc Vitals Group     BP 06/11/21 1615 (!) 193/64     Pulse Rate 06/11/21 1615 (!) 59     Resp 06/11/21 1615 16     Temp 06/11/21 1615 98 F (36.7 C)     Temp Source 06/11/21 1615 Oral     SpO2 06/11/21 1615 98 %     Weight 06/11/21 1618 130 lb (59 kg)     Height 06/11/21 1618 5\' 4"  (1.626 m)     Head Circumference --      Peak Flow --      Pain Score 06/11/21 1615 0     Pain Loc --      Pain Edu? --      Excl. in [place]? --     Most recent vital signs: Vitals:   06/11/21 2015 06/11/21 2030  BP:  (!) 130/107  Pulse: 64 72  Resp: 15 20  Temp:    SpO2: 98% 97%     Constitutional: Alert  Eyes: Conjunctivae are normal.  Head: Atraumatic. Nose: No congestion/rhinnorhea. Mouth/Throat: Mucous membranes are moist.   Neck: Painless ROM.  Cardiovascular:   Good peripheral circulation. Respiratory: Normal respiratory effort.  No retractions.  Gastrointestinal: Soft and nontender.  Musculoskeletal:  no deformity Neurologic:  MAE spontaneously. No gross focal neurologic deficits are appreciated.  Skin:  Skin is warm, dry and intact. No rash noted. Psychiatric: Mood and affect are normal. Speech and behavior are normal.    ED Results / Procedures / Treatments   Labs (all labs ordered are listed, but only  abnormal results are displayed) Labs Reviewed  CBC - Abnormal; Notable for the following components:      Result Value   WBC 15.6 (*)    RBC 2.69 (*)    Hemoglobin 10.8 (*)    HCT 29.4 (*)    MCV 109.3 (*)    MCH 40.1 (*)    MCHC 36.7 (*)    RDW 16.9 (*)    All other components within normal limits  COMPREHENSIVE METABOLIC PANEL - Abnormal; Notable for the following components:   Glucose, Bld 136 (*)    BUN 34 (*)    Creatinine, Ser 1.56 (*)    GFR, Estimated 32 (*)    All other components within normal limits  BRAIN NATRIURETIC PEPTIDE - Abnormal; Notable for the following components:   B Natriuretic Peptide 216.6 (*)    All other components within normal limits  RESP PANEL BY RT-PCR (FLU A&B, COVID) ARPGX2  LIPASE, BLOOD  LACTIC ACID, PLASMA  URINALYSIS, COMPLETE (UACMP) WITH MICROSCOPIC  TROPONIN I (HIGH SENSITIVITY)  TROPONIN I (HIGH SENSITIVITY)     EKG  ED ECG REPORT I, Merlyn Lot, the  attending physician, personally viewed and interpreted this ECG.   Date: 06/11/2021  EKG Time: 16:13  Rate: 60  Rhythm: sinus  Axis: normal  Intervals:normal qt  ST&T Change: lvh, no stemi    RADIOLOGY Please see ED Course for my review and interpretation.  I personally reviewed all radiographic images ordered to evaluate for the above acute complaints and reviewed radiology reports and findings.  These findings were personally discussed with the patient.  Please see medical record for radiology report.    PROCEDURES:  Critical Care performed:   Procedures   MEDICATIONS ORDERED IN ED: Medications  cephALEXin (KEFLEX) capsule 500 mg (500 mg Oral Given 06/11/21 2102)  azithromycin (ZITHROMAX) tablet 500 mg (500 mg Oral Given 06/11/21 2102)     IMPRESSION / MDM / ASSESSMENT AND PLAN / ED COURSE  I reviewed the triage vital signs and the nursing notes.                              Differential diagnosis includes, but is not limited to, acs, enteritis,  gastritis, sbo, diverticulitis, pancreatitis, biliary pathology, pna, chf  Patient presenting with nausea few episodes of vomiting with few episodes of coughing earlier today.  She denies any abdominal pain right now.  States she was reluctant to come to the ER.  Does have history of cardiac disease but denies any chest pain or pressure at this time.  Denies any dysuria no diarrhea.  Does have some neighbors that are sick with upper respiratory infection.  Clinical Course as of 06/11/21 2108  Thu Jun 11, 2021  1645 Chest x-ray by my review does not show any evidence of pneumothorax. [PR]  1811 Patient reassessed.  She states she feels clinically very well is not having any nausea she denies any abdominal pain.  Is requesting something to eat.  [place] Busing is normal.  Does have chronic leukocytosis but no fever here no hypoxia.  She is requesting discharge home. [PR]  2105 Reassessed.  States that she feels very well.  She is up reading her book is requesting discharge home.  Discussed possible need for admission given findings of possible early pneumonia with nausea and vomiting but patient not agreeable to be admitted at this time.  Not hypoxic with reassuring workup, I think trial of outpatient management is appropriate.  given her findings I do feel that treating with a course of antibiotics is appropriate patient agreeable to plan. .Have discussed with the patient and available family all diagnostics and treatments performed thus far and all questions were answered to the best of my ability. The patient demonstrates understanding and agreement with plan.   [PR]    Clinical Course User Index [PR] Merlyn Lot, MD     FINAL CLINICAL IMPRESSION(S) / ED DIAGNOSES   Final diagnoses:  Nausea and vomiting, unspecified vomiting type  Acute cough     Rx / DC Orders   ED Discharge Orders          Ordered    cephALEXin (KEFLEX) 500 MG capsule  2 times daily        06/11/21 2105    azithromycin  (ZITHROMAX) 250 MG tablet  Daily        06/11/21 2105    ondansetron (ZOFRAN) 4 MG tablet  Every 8 hours PRN        06/11/21 2105             Note:  This  document was prepared using Systems analyst and may include unintentional dictation errors.    Merlyn Lot, MD 06/11/21 2108

## 2021-06-15 ENCOUNTER — Ambulatory Visit (INDEPENDENT_AMBULATORY_CARE_PROVIDER_SITE_OTHER): Payer: Medicare Other | Admitting: Internal Medicine

## 2021-06-15 ENCOUNTER — Other Ambulatory Visit: Payer: Self-pay

## 2021-06-15 ENCOUNTER — Encounter: Payer: Self-pay | Admitting: Internal Medicine

## 2021-06-15 DIAGNOSIS — N184 Chronic kidney disease, stage 4 (severe): Secondary | ICD-10-CM

## 2021-06-15 DIAGNOSIS — K529 Noninfective gastroenteritis and colitis, unspecified: Secondary | ICD-10-CM | POA: Diagnosis not present

## 2021-06-15 DIAGNOSIS — J181 Lobar pneumonia, unspecified organism: Secondary | ICD-10-CM | POA: Insufficient documentation

## 2021-06-15 NOTE — Assessment & Plan Note (Signed)
GFR stable despite the GI bug Continue losartan 100mg  daily

## 2021-06-15 NOTE — Assessment & Plan Note (Signed)
Mild RLL changes on CXR Has taken the z-pak--and I am concerned about diarrhea being related to this Will stop both antibiotics Plan to recheck CXR at next appointment

## 2021-06-15 NOTE — Assessment & Plan Note (Addendum)
Course most consistent with viral gastroenteritis Has ondansetron 4mg  for prn use Unclear why she relapses (unless it was reaction to the antibiotics--will stop them) Abdominal exam is quite benign

## 2021-06-15 NOTE — Progress Notes (Signed)
Subjective:    Patient ID: Tiffany Martinez, female    DOB: [birth year], 88 y.o.   MRN: [id number]  HPI Here for ER follow up  Developed sudden diarrhea and vomiting--went to ER that evening (1/26) Felt ready to pass out Bad enough to prompt ER visit---ER records, labs, CXR reviewed COVID negative Testing generally benign -----only finding mild RLL infiltrate She had only mild cough and some phlegm Had some "rush of heat" with the vomiting--but doesn't feel she has fever  She could feel her abdomen---"travelling across it"--but no major pain (only brief) Not hard or tender No hematemesis 3-4 episodes of diarrhea---urgency  Got home and went straight to bed Felt pretty good the next 2 days---then it recurred last night Wondered if it was the antibiotics (but no prior problem with these) [facility] team came to check her--didn't get sent back to ER though  Hasn't eaten today Drank coffee and water No diarrhea or vomiting today Hadn't used the ondansetron  GFR stable in the 20's  Current Outpatient Medications on File Prior to Visit  Medication Sig Dispense Refill   amLODipine (NORVASC) 5 MG tablet Take 1 tablet (5 mg total) by mouth daily. 90 tablet 3   atorvastatin (LIPITOR) 20 MG tablet Take 1 tablet (20 mg total) by mouth at bedtime.     azithromycin (ZITHROMAX) 250 MG tablet Take 1 tablet (250 mg total) by mouth daily for 4 days. Take 1 tablet daily for 3 days. 4 tablet 0   cephALEXin (KEFLEX) 500 MG capsule Take 1 capsule (500 mg total) by mouth 2 (two) times daily for 7 days. 14 capsule 0   lactose free nutrition (BOOST) LIQD Take 237 mLs by mouth daily as needed.     losartan (COZAAR) 100 MG tablet Take 1 tablet (100 mg total) by mouth daily. 90 tablet 3   ondansetron (ZOFRAN) 4 MG tablet Take 1 tablet (4 mg total) by mouth every 8 (eight) hours as needed for nausea or vomiting. 10 tablet 0   No current facility-administered medications on file prior to visit.     Allergies  Allergen Reactions   Triamterene Hives    Past Medical History:  Diagnosis Date   AAA (abdominal aortic aneurysm) 2012   3.9cm, rec rpt 1 yr   Anemia    Anxiety    CHF (congestive heart failure) (HCC)    Chronic venous insufficiency    CLL (chronic lymphocytic leukemia) ([place]) 2014   Stage 0--by flow cytometry   Hx of colonoscopy    Hyperlipidemia    Hypertension    Melanoma ([place])    Right Arm   Migraine headache    3-4X/yr   PAD (peripheral artery disease) ([place]) 2012   severe R common iliac stenosis, ABI 0.73, mid L common iliac stenosis ABI 1.1   PAD (peripheral artery disease) ([place])    Wears dentures    full upper and lower    Past Surgical History:  Procedure Laterality Date   ABDOMINAL AORTIC ANEURYSM REPAIR  02/16/12   [facility]--Dr Sammuel Hines   CARDIOVASCULAR STRESS TEST  2008   negative   CATARACT EXTRACTION W/PHACO Left 07/16/2019   Procedure: CATARACT EXTRACTION PHACO AND INTRAOCULAR LENS PLACEMENT (IOC) LEFT 8.26  00:46.6;  Surgeon: Eulogio Bear, MD;  Location: [place];  Service: Ophthalmology;  Laterality: Left;   DOBUTAMINE STRESS ECHO  11/11   negative   HIP ARTHROPLASTY Right 10/10/2019   Procedure: ARTHROPLASTY BIPOLAR HIP (HEMIARTHROPLASTY);  Surgeon: Corky Mull,  MD;  Location: [facility];  Service: Orthopedics;  Laterality: Right;   KNEE ARTHROSCOPY  1998   right   MELANOMA EXCISION  11/09/10   wide excision right upper arm   SQUAMOUS CELL CARCINOMA EXCISION  02/2016   TONSILLECTOMY AND ADENOIDECTOMY  1960    Family History  Problem Relation Age of Onset   Alzheimer's disease Mother    Pancreatic cancer Father    Parkinson's disease Brother    Coronary artery disease Neg Hx    Diabetes Neg Hx    Cancer Neg Hx        breast or colon    Social History   Socioeconomic History   Marital status: Widowed    Spouse name: Not on file   Number of children: 4   Years of education: Not on file   Highest education level:  Not on file  Occupational History   Occupation: retired- Therapist, sports,   Tobacco Use   Smoking status: Former    Types: Cigarettes    Quit date: 03/17/2021    Years since quitting: 0.2   Smokeless tobacco: Never   Tobacco comments:    occassionally  Vaping Use   Vaping Use: Never used  Substance and Sexual Activity   Alcohol use: Yes    Alcohol/week: 7.0 standard drinks    Types: 7 Glasses of wine per week    Comment: regular wine with dinner   Drug use: No   Sexual activity: Never  Other Topics Concern   Not on file  Social History Narrative   Widowed 2/11 -2nd for him , 1st for her. 4 step sons. 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         Has living will and health care POA--changing her POA at this point   Requests DNR   Discussed MOST form---but would probably accept IV fluids/antibiotics, etc   Requests no feeding tube         Social Determinants of Health   Financial Resource Strain: Not on file  Food Insecurity: Not on file  Transportation Needs: Not on file  Physical Activity: Not on file  Stress: Not on file  Social Connections: Not on file  Intimate Partner Violence: Not on file   Review of Systems No dysuria Still with little cough     Objective:   Physical Exam Constitutional:      Appearance: Normal appearance.  Cardiovascular:     Rate and Rhythm: Normal rate and regular rhythm.     Heart sounds: No murmur heard.   No gallop.  Pulmonary:     Effort: Pulmonary effort is normal.     Breath sounds: Normal breath sounds. No wheezing or rales.  Abdominal:     General: Bowel sounds are normal. There is no distension.     Palpations: Abdomen is soft.     Tenderness: There is no abdominal tenderness. There is no guarding or rebound.  Musculoskeletal:     Cervical back: Neck supple.     Right lower leg: No edema.     Left lower leg: No edema.  Lymphadenopathy:     Cervical: No cervical adenopathy.  Neurological:      Mental Status: She is alert.           Assessment & Plan:

## 2021-06-24 DIAGNOSIS — D485 Neoplasm of uncertain behavior of skin: Secondary | ICD-10-CM | POA: Diagnosis not present

## 2021-06-24 DIAGNOSIS — L858 Other specified epidermal thickening: Secondary | ICD-10-CM | POA: Diagnosis not present

## 2021-06-24 DIAGNOSIS — D0439 Carcinoma in situ of skin of other parts of face: Secondary | ICD-10-CM | POA: Diagnosis not present

## 2021-07-09 ENCOUNTER — Ambulatory Visit: Payer: Medicare Other | Admitting: Nurse Practitioner

## 2021-07-09 ENCOUNTER — Encounter: Payer: Self-pay | Admitting: Nurse Practitioner

## 2021-07-09 ENCOUNTER — Encounter (HOSPITAL_COMMUNITY): Payer: Self-pay | Admitting: Radiology

## 2021-07-09 ENCOUNTER — Other Ambulatory Visit: Payer: Self-pay

## 2021-07-09 VITALS — BP 160/70 | HR 61 | Temp 98.0°F | Ht 64.5 in

## 2021-07-09 DIAGNOSIS — M79644 Pain in right finger(s): Secondary | ICD-10-CM

## 2021-07-09 DIAGNOSIS — I10 Essential (primary) hypertension: Secondary | ICD-10-CM

## 2021-07-09 MED ORDER — DICLOFENAC SODIUM 1 % EX GEL: 2.0000 g | Freq: Four times a day (QID) | CUTANEOUS | 1 refills | Status: DC

## 2021-07-09 NOTE — Progress Notes (Signed)
Careteam: Patient Care Team: Venia Carbon, MD as PCP - Cyndia Diver, MD (Cardiology)  PLACE OF SERVICE:  [place] Directive information    Allergies  Allergen Reactions   Triamterene Hives    Chief Complaint  Patient presents with   Acute Visit    Patient complains of cyst. Patient has cyst on right wrist and had cyst for about 2 days. Cyst is very painful. No drainage. No redness in area of cyst. She denies any injury to wrist. Patient took oxycodone for pain about one hour ago.Pain is constant.      HPI: Patient is a 88 y.o. female due to pain cyst. Pt with hx of CKD, CHF, HTN, COPD, CLL, PAD.  Reports she has hx of ganglion cyst and feels like 2 are having a "argument"  Work up in the middle of the night due to pain. She did not take anything.  This morning was very painful.  Reports she had an old prescription for oxycodone but does not feel like it has benefited. She has been sleepy.  She has used menthol patch which has been helpful as well.  Noticed her fingers started to swell. When she does anything that requires movement of force it is very painful.  She reports hx of falls but no falls in months as she is very careful. No injury  No redness, swelling or drainage.    Elevated blood pressure today, she reports that it has been high and was told to monitor at home, she has not done this yet. Low sodium diet.  Review of Systems:  Review of Systems  Cardiovascular:  Negative for chest pain and leg swelling.  Musculoskeletal:  Positive for joint pain and myalgias. Negative for falls.  Skin:  Negative for itching and rash.  Neurological:  Negative for dizziness and headaches.   Past Medical History:  Diagnosis Date   AAA (abdominal aortic aneurysm) 2012   3.9cm, rec rpt 1 yr   Anemia    Anxiety    CHF (congestive heart failure) (HCC)    Chronic venous insufficiency    CLL (chronic lymphocytic leukemia) ([place]) 2014   Stage 0--by  flow cytometry   Hx of colonoscopy    Hyperlipidemia    Hypertension    Melanoma ([place])    Right Arm   Migraine headache    3-4X/yr   PAD (peripheral artery disease) ([place]) 2012   severe R common iliac stenosis, ABI 0.73, mid L common iliac stenosis ABI 1.1   PAD (peripheral artery disease) ([place])    Wears dentures    full upper and lower   Past Surgical History:  Procedure Laterality Date   ABDOMINAL AORTIC ANEURYSM REPAIR  02/16/12   [facility]--Dr Sammuel Hines   CARDIOVASCULAR STRESS TEST  2008   negative   CATARACT EXTRACTION W/PHACO Left 07/16/2019   Procedure: CATARACT EXTRACTION PHACO AND INTRAOCULAR LENS PLACEMENT (IOC) LEFT 8.26  00:46.6;  Surgeon: Eulogio Bear, MD;  Location: [place];  Service: Ophthalmology;  Laterality: Left;   DOBUTAMINE STRESS ECHO  11/11   negative   HIP ARTHROPLASTY Right 10/10/2019   Procedure: ARTHROPLASTY BIPOLAR HIP (HEMIARTHROPLASTY);  Surgeon: Corky Mull, MD;  Location: [facility];  Service: Orthopedics;  Laterality: Right;   KNEE ARTHROSCOPY  1998   right   MELANOMA EXCISION  11/09/10   wide excision right upper arm   SQUAMOUS CELL CARCINOMA EXCISION  02/2016   TONSILLECTOMY AND ADENOIDECTOMY  1960  Social History:   reports that she quit smoking about 3 months ago. Her smoking use included cigarettes. She has never used smokeless tobacco. She reports current alcohol use of about 7.0 standard drinks per week. She reports that she does not use drugs.  Family History  Problem Relation Age of Onset   Alzheimer's disease Mother    Pancreatic cancer Father    Parkinson's disease Brother    Coronary artery disease Neg Hx    Diabetes Neg Hx    Cancer Neg Hx        breast or colon    Medications: Patient's Medications  New Prescriptions   No medications on file  Previous Medications   AMLODIPINE (NORVASC) 5 MG TABLET    Take 1 tablet (5 mg total) by mouth daily.   ATORVASTATIN (LIPITOR) 20 MG TABLET    Take 1 tablet (20 mg total) by  mouth at bedtime.   LACTOSE FREE NUTRITION (BOOST) LIQD    Take 237 mLs by mouth daily as needed.   LOSARTAN (COZAAR) 100 MG TABLET    Take 1 tablet (100 mg total) by mouth daily.   ONDANSETRON (ZOFRAN) 4 MG TABLET    Take 1 tablet (4 mg total) by mouth every 8 (eight) hours as needed for nausea or vomiting.  Modified Medications   No medications on file  Discontinued Medications   No medications on file    Physical Exam:  Vitals:   07/09/21 1312  BP: (!) 160/70  Pulse: 61  Temp: 98 F (36.7 C)  SpO2: 97%  Height: 5' 4.5" (1.638 m)   Body mass index is 22.81 kg/m. Wt Readings from Last 3 Encounters:  06/15/21 135 lb (61.2 kg)  06/11/21 130 lb (59 kg)  04/24/21 140 lb (63.5 kg)    Physical Exam Constitutional:      Appearance: Normal appearance.  Musculoskeletal:     Right hand: Tenderness (to IP joint of thumb) and bony tenderness present.  Skin:    General: Skin is warm and dry.  Neurological:     Mental Status: She is alert.    Labs reviewed: Basic Metabolic Panel: Recent Labs    10/17/20 1214 03/11/21 1052 04/03/21 0045 06/11/21 1621  NA 134* 138 138 136  K 4.1 3.9 3.6 3.8  CL 103 107 107 104  CO2 19 24 23 25   GLUCOSE 104* 110* 181* 136*  BUN 40* 32* 27* 34*  CREATININE 1.74* 1.75* 1.71* 1.56*  CALCIUM 8.8 10.0 9.5 10.2  PHOS 4.2  --   --   --    Liver Function Tests: Recent Labs    10/17/20 1214 06/11/21 1621  AST  --  24  ALT  --  13  ALKPHOS  --  78  BILITOT  --  0.5  PROT  --  6.7  ALBUMIN 3.7 3.6   Recent Labs    06/11/21 1621  LIPASE 40   No results for input(s): AMMONIA in the last 8760 hours. CBC: Recent Labs    03/11/21 1052 04/03/21 0045 06/11/21 1621  WBC 22.6* 16.9* 15.6*  NEUTROABS  --  4.1  --   HGB 12.1 10.5* 10.8*  HCT 32.5* 31.6* 29.4*  MCV 108.0* 105.7* 109.3*  PLT 151 116* 180   Lipid Panel: No results for input(s): CHOL, HDL, LDLCALC, TRIG, CHOLHDL, LDLDIRECT in the last 8760 hours. TSH: No results for  input(s): TSH in the last 8760 hours. A1C: Lab Results  Component Value Date   HGBA1C  4.9 04/03/2021     Assessment/Plan 1. Thumb pain, right -can not do oral NSAID due to CKD stage 4.  -educated against continuing oxycodone due to side effects (very fatigue during todays visit)  -to try tylenol 325 mg 2 tabets every 6 hours as needed pain- she has not tired this yet.  -discuss short course of prednisone, she does not wish to take prednisone at this time -ice joint TID ~20-30 mins.  -continue menthol patch as needed but to not apply medication under or ice over.  - diclofenac Sodium (VOLTAREN) 1 % GEL; Apply 2 g topically 4 (four) times daily.  Dispense: 100 g; Refill: 1  2. Essential hypertension, benign -elevated today,  -DASH diet encouraged  -to follow  blood pressure at home -she plans to follow up with PCP regarding elevated BP.     Carlos American. [place] [phone]

## 2021-07-09 NOTE — Patient Instructions (Addendum)
Start with tylenol 325 mg by mouth 2 tablets every 6 hours as needed for pain To ice thumb three times daily ~20 mins  Can use voltaren gel 4 times dialy as needed for pain- use after ice.  Please call about your pharmacy and we can send it.

## 2021-07-30 ENCOUNTER — Other Ambulatory Visit: Payer: Self-pay

## 2021-07-30 ENCOUNTER — Encounter: Payer: Self-pay | Admitting: Internal Medicine

## 2021-07-30 ENCOUNTER — Ambulatory Visit (INDEPENDENT_AMBULATORY_CARE_PROVIDER_SITE_OTHER): Payer: Medicare Other | Admitting: Internal Medicine

## 2021-07-30 VITALS — BP 150/76 | HR 72 | Temp 98.6°F | Ht 64.0 in | Wt 139.0 lb

## 2021-07-30 DIAGNOSIS — C9110 Chronic lymphocytic leukemia of B-cell type not having achieved remission: Secondary | ICD-10-CM | POA: Diagnosis not present

## 2021-07-30 DIAGNOSIS — I700 Atherosclerosis of aorta: Secondary | ICD-10-CM | POA: Diagnosis not present

## 2021-07-30 DIAGNOSIS — F39 Unspecified mood [affective] disorder: Secondary | ICD-10-CM

## 2021-07-30 DIAGNOSIS — N184 Chronic kidney disease, stage 4 (severe): Secondary | ICD-10-CM

## 2021-07-30 DIAGNOSIS — Z0000 Encounter for general adult medical examination without abnormal findings: Secondary | ICD-10-CM

## 2021-07-30 DIAGNOSIS — I10 Essential (primary) hypertension: Secondary | ICD-10-CM | POA: Diagnosis not present

## 2021-07-30 DIAGNOSIS — I739 Peripheral vascular disease, unspecified: Secondary | ICD-10-CM

## 2021-07-30 DIAGNOSIS — I5032 Chronic diastolic (congestive) heart failure: Secondary | ICD-10-CM | POA: Diagnosis not present

## 2021-07-30 DIAGNOSIS — J449 Chronic obstructive pulmonary disease, unspecified: Secondary | ICD-10-CM

## 2021-07-30 NOTE — Assessment & Plan Note (Signed)
No claudication but walks very limited ?Urged her to cut or stop cigarettes ?Is on atorvastatin 20 ?

## 2021-07-30 NOTE — Assessment & Plan Note (Signed)
Mild symptoms ?Still smokes ?Okay without any Rx ?

## 2021-07-30 NOTE — Assessment & Plan Note (Signed)
Mild depression at times and frustrated by limitations ?Rx not indicated ?

## 2021-07-30 NOTE — Assessment & Plan Note (Addendum)
BP Readings from Last 3 Encounters:  ?07/30/21 (!) 150/76  ?07/09/21 (!) 160/70  ?06/15/21 140/70  ? ?Tends to have white coat HTN ?Will just continue the losartan '100mg'$  and amlodipine 5 daily ?

## 2021-07-30 NOTE — Assessment & Plan Note (Signed)
No fluid overload ?Doing okay on losartan '100mg'$  ?

## 2021-07-30 NOTE — Assessment & Plan Note (Signed)
rusn 29-32 ?Is on the losartan ?

## 2021-07-30 NOTE — Progress Notes (Signed)
? ?Subjective:  ? ? Patient ID: Tiffany Martinez, female    DOB: 1934/02/18, 88 y.o.   MRN: [id number] ? ?HPI ?Here for Medicare wellness visit and follow up of chronic health conditions ?Reviewed advanced directives ?Reviewed other doctors---Dr Isenstein--derm ?Hospitalized a few months ago for pelvic fracture--then to rehab ?Doing better now---using rollator ?Has [facility] staff to shop for her----deliver meals and meds, etc ?She does laundry, dishes, etc. She does sponge bath and independent with other ADLS ?Glass of wine with dinner ?Still smokes 3-5 cigarettes a day--not willing to stop ?No tobacco ?Vision is fine--doesn't see eye doctor ?Hearing is good ?Has had several close calls---but only the 1 fall with injury ?No exercise ?Mild depression--but not daily. Not really anhedonic ?Some memory issues ? ?She was concerned about letter regarding the CXR in January ?Had possible infiltrate--but no cough, fever and breathing okay ? ?ER visit for N/V ?This resolved and no further problems ?Eating fair and weight is stable ? ?Has trouble getting going in the morning ?Gets up and will feel weak---and needs to sit ?This improves in the afternoon ? ?No chest pain ?Wonders about her BP---afraid it is too high here (does normalize quickly) ?Slight palpitations ?No dizziness ?Mild swelling--did have some serous drainage after excision on right leg by derm ?No claudication--but limited walking ? ?Recent WBC 15.6 ?    ? ?Current Outpatient Medications on File Prior to Visit  ?Medication Sig Dispense Refill  ? amLODipine (NORVASC) 5 MG tablet Take 1 tablet (5 mg total) by mouth daily. 90 tablet 3  ? atorvastatin (LIPITOR) 20 MG tablet Take 1 tablet (20 mg total) by mouth at bedtime.    ? diclofenac Sodium (VOLTAREN) 1 % GEL Apply 2 g topically 4 (four) times daily. 100 g 1  ? lactose free nutrition (BOOST) LIQD Take 237 mLs by mouth daily as needed.    ? losartan (COZAAR) 100 MG tablet Take 1 tablet (100 mg total) by mouth  daily. 90 tablet 3  ? ondansetron (ZOFRAN) 4 MG tablet Take 1 tablet (4 mg total) by mouth every 8 (eight) hours as needed for nausea or vomiting. 10 tablet 0  ? ?No current facility-administered medications on file prior to visit.  ? ? ?Allergies  ?Allergen Reactions  ? Triamterene Hives  ? ? ?Past Medical History:  ?Diagnosis Date  ? AAA (abdominal aortic aneurysm) 2012  ? 3.9cm, rec rpt 1 yr  ? Anemia   ? Anxiety   ? CHF (congestive heart failure) ([place])   ? Chronic venous insufficiency   ? CLL (chronic lymphocytic leukemia) ([place]) 2014  ? Stage 0--by flow cytometry  ? Hx of colonoscopy   ? Hyperlipidemia   ? Hypertension   ? Melanoma ([place])   ? Right Arm  ? Migraine headache   ? 3-4X/yr  ? PAD (peripheral artery disease) ([place]) 2012  ? severe R common iliac stenosis, ABI 0.73, mid L common iliac stenosis ABI 1.1  ? PAD (peripheral artery disease) ([place])   ? Wears dentures   ? full upper and lower  ? ? ?Past Surgical History:  ?Procedure Laterality Date  ? ABDOMINAL AORTIC ANEURYSM REPAIR  02/16/12  ? [facility]--Dr Sammuel Hines  ? CARDIOVASCULAR STRESS TEST  2008  ? negative  ? CATARACT EXTRACTION W/PHACO Left 07/16/2019  ? Procedure: CATARACT EXTRACTION PHACO AND INTRAOCULAR LENS PLACEMENT (IOC) LEFT 8.26  00:46.6;  Surgeon: Eulogio Bear, MD;  Location: [place];  Service: Ophthalmology;  Laterality: Left;  ? DOBUTAMINE STRESS  ECHO  11/11  ? negative  ? HIP ARTHROPLASTY Right 10/10/2019  ? Procedure: ARTHROPLASTY BIPOLAR HIP (HEMIARTHROPLASTY);  Surgeon: Corky Mull, MD;  Location: [facility];  Service: Orthopedics;  Laterality: Right;  ? KNEE ARTHROSCOPY  1998  ? right  ? MELANOMA EXCISION  11/09/10  ? wide excision right upper arm  ? SQUAMOUS CELL CARCINOMA EXCISION  02/2016  ? TONSILLECTOMY AND ADENOIDECTOMY  1960  ? ? ?Family History  ?Problem Relation Age of Onset  ? Alzheimer's disease Mother   ? Pancreatic cancer Father   ? Parkinson's disease Brother   ? Coronary artery disease Neg Hx   ? Diabetes Neg Hx   ?  Cancer Neg Hx   ?     breast or colon  ? ? ?Social History  ? ?Socioeconomic History  ? Marital status: Widowed  ?  Spouse name: Not on file  ? Number of children: 4  ? Years of education: Not on file  ? Highest education level: Not on file  ?Occupational History  ? Occupation: retiredCommunity education officer,   ?Tobacco Use  ? Smoking status: Former  ?  Types: Cigarettes  ?  Quit date: 03/17/2021  ?  Years since quitting: 0.3  ? Smokeless tobacco: Never  ? Tobacco comments:  ?  occassionally  ?Vaping Use  ? Vaping Use: Never used  ?Substance and Sexual Activity  ? Alcohol use: Yes  ?  Alcohol/week: 7.0 standard drinks  ?  Types: 7 Glasses of wine per week  ?  Comment: regular wine with dinner  ? Drug use: No  ? Sexual activity: Never  ?Other Topics Concern  ? Not on file  ?Social History Narrative  ? Widowed 2/11 -2nd for him , 1st for her. 4 step sons. Married 1972  ? 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   ?   ? Has living will and health care POA--changing her POA at this point  ? Requests DNR  ? Discussed MOST form---but would probably accept IV fluids/antibiotics, etc  ? Requests no feeding tube  ?   ?   ? ?Social Determinants of Health  ? ?Financial Resource Strain: Not on file  ?Food Insecurity: Not on file  ?Transportation Needs: Not on file  ?Physical Activity: Not on file  ?Stress: Not on file  ?Social Connections: Not on file  ?Intimate Partner Violence: Not on file  ? ?Review of Systems ?Sleep is variable---naps in day. Likes to watch late night TV ?Full dentures--doesn't see dentist ?Wears seat belt ?No heartburn or dysphagia ?Bowels are fine--no blood ?Voids okay--no dysuria. No incontinence ?No sig back or joint pains ?   ?Objective:  ? Physical Exam ?Constitutional:   ?   Appearance: Normal appearance.  ?HENT:  ?   Mouth/Throat:  ?   Comments: No lesions ?Eyes:  ?   Conjunctiva/sclera: Conjunctivae normal.  ?   Pupils: Pupils are equal, round, and reactive to light.  ?Cardiovascular:  ?   Rate  and Rhythm: Normal rate and regular rhythm.  ?   Heart sounds: No murmur heard. ?  No gallop.  ?   Comments: Faint pulse left foot, 1+ on right ?Abdominal:  ?   Palpations: Abdomen is soft.  ?   Tenderness: There is no abdominal tenderness.  ?Musculoskeletal:  ?   Cervical back: Neck supple.  ?   Right lower leg: No edema.  ?   Left lower leg: No edema.  ?Lymphadenopathy:  ?  Cervical: No cervical adenopathy.  ?Skin: ?   Findings: No rash.  ?   Comments: Stasis changes on calves ?Healing biopsy spot--anterior right calf  ?Neurological:  ?   Mental Status: She is alert.  ?   Comments: Mini-Cog---- clock (several mistakes), memory 2/3  ?Psychiatric:     ?   Mood and Affect: Mood normal.     ?   Behavior: Behavior normal.  ?  ? ? ? ? ?   ?Assessment & Plan:  ? ?

## 2021-07-30 NOTE — Assessment & Plan Note (Signed)
I have personally reviewed the Medicare Annual Wellness questionnaire and have noted ?1. The patient's medical and social history ?2. Their use of alcohol, tobacco or illicit drugs ?3. Their current medications and supplements ?4. The patient's functional ability including ADL's, fall risks, home safety risks and hearing or visual ?            impairment. ?5. Diet and physical activities ?6. Evidence for depression or mood disorders ? ?The patients weight, height, BMI and visual acuity have been recorded in the chart ?I have made referrals, counseling and provided education to the patient based review of the above and I have provided the pt with a written personalized care plan for preventive services. ? ?I have provided you with a copy of your personalized plan for preventive services. Please take the time to review along with your updated medication list. ? ?No cancer screening due to age ?COVID vaccine if recommended in fall ?Flu vaccine in the fall ?Discussed some strengthening exercises ?Won't stop smoking ?

## 2021-07-30 NOTE — Assessment & Plan Note (Signed)
On imaging Is on the statin 

## 2021-07-30 NOTE — Assessment & Plan Note (Signed)
Mild WBC elevation and anemia ?May be related to her fatigue ?Will recheck next time ?Not considering Rx ?

## 2021-09-16 DIAGNOSIS — D485 Neoplasm of uncertain behavior of skin: Secondary | ICD-10-CM | POA: Diagnosis not present

## 2021-12-03 DIAGNOSIS — C44722 Squamous cell carcinoma of skin of right lower limb, including hip: Secondary | ICD-10-CM | POA: Diagnosis not present

## 2021-12-03 DIAGNOSIS — D485 Neoplasm of uncertain behavior of skin: Secondary | ICD-10-CM | POA: Diagnosis not present

## 2021-12-15 DIAGNOSIS — I480 Paroxysmal atrial fibrillation: Secondary | ICD-10-CM

## 2021-12-15 HISTORY — DX: Paroxysmal atrial fibrillation: I48.0

## 2021-12-30 ENCOUNTER — Encounter: Payer: Self-pay | Admitting: Emergency Medicine

## 2021-12-30 ENCOUNTER — Telehealth: Payer: Self-pay

## 2021-12-30 ENCOUNTER — Other Ambulatory Visit: Payer: Self-pay

## 2021-12-30 ENCOUNTER — Emergency Department
Admission: EM | Admit: 2021-12-30 | Discharge: 2021-12-30 | Discharge status: Against Medical Advice | Payer: Medicare Other | Attending: Emergency Medicine | Admitting: Emergency Medicine

## 2021-12-30 DIAGNOSIS — R42 Dizziness and giddiness: Secondary | ICD-10-CM | POA: Diagnosis not present

## 2021-12-30 DIAGNOSIS — R112 Nausea with vomiting, unspecified: Secondary | ICD-10-CM | POA: Insufficient documentation

## 2021-12-30 DIAGNOSIS — R0689 Other abnormalities of breathing: Secondary | ICD-10-CM | POA: Diagnosis not present

## 2021-12-30 DIAGNOSIS — Z5321 Procedure and treatment not carried out due to patient leaving prior to being seen by health care provider: Secondary | ICD-10-CM | POA: Insufficient documentation

## 2021-12-30 DIAGNOSIS — I10 Essential (primary) hypertension: Secondary | ICD-10-CM | POA: Diagnosis not present

## 2021-12-30 DIAGNOSIS — R55 Syncope and collapse: Secondary | ICD-10-CM | POA: Insufficient documentation

## 2021-12-30 DIAGNOSIS — R110 Nausea: Secondary | ICD-10-CM | POA: Diagnosis not present

## 2021-12-30 DIAGNOSIS — R451 Restlessness and agitation: Secondary | ICD-10-CM | POA: Diagnosis not present

## 2021-12-30 LAB — COMPREHENSIVE METABOLIC PANEL
ALT: 14 U/L (ref 0–44)
AST: 22 U/L (ref 15–41)
Albumin: 4.1 g/dL (ref 3.5–5.0)
Alkaline Phosphatase: 73 U/L (ref 38–126)
Anion gap: 8 (ref 5–15)
BUN: 29 mg/dL — ABNORMAL HIGH (ref 8–23)
CO2: 21 mmol/L — ABNORMAL LOW (ref 22–32)
Calcium: 9.9 mg/dL (ref 8.9–10.3)
Chloride: 108 mmol/L (ref 98–111)
Creatinine, Ser: 1.44 mg/dL — ABNORMAL HIGH (ref 0.44–1.00)
GFR, Estimated: 36 mL/min — ABNORMAL LOW (ref >60)
Glucose, Bld: 115 mg/dL — ABNORMAL HIGH (ref 70–99)
Potassium: 3.8 mmol/L (ref 3.5–5.1)
Sodium: 137 mmol/L (ref 135–145)
Total Bilirubin: 0.9 mg/dL (ref 0.3–1.2)
Total Protein: 6.8 g/dL (ref 6.5–8.1)

## 2021-12-30 LAB — CBC
HCT: 31.8 % — ABNORMAL LOW (ref 36.0–46.0)
Hemoglobin: 11.1 g/dL — ABNORMAL LOW (ref 12.0–15.0)
MCH: 37.9 pg — ABNORMAL HIGH (ref 26.0–34.0)
MCHC: 34.9 g/dL (ref 30.0–36.0)
MCV: 108.5 fL — ABNORMAL HIGH (ref 80.0–100.0)
Platelets: 149 10*3/uL — ABNORMAL LOW (ref 150–400)
RBC: 2.93 MIL/uL — ABNORMAL LOW (ref 3.87–5.11)
RDW: 14.7 % (ref 11.5–15.5)
WBC: 24.8 10*3/uL — ABNORMAL HIGH (ref 4.0–10.5)
nRBC: 0.2 % (ref 0.0–0.2)

## 2021-12-30 LAB — LIPASE, BLOOD: Lipase: 41 U/L (ref 11–51)

## 2021-12-30 LAB — TROPONIN I (HIGH SENSITIVITY): Troponin I (High Sensitivity): 18 ng/L — ABNORMAL HIGH (ref <18)

## 2021-12-30 MED ORDER — FUROSEMIDE 40 MG PO TABS: 40.0000 mg | ORAL_TABLET | Freq: Every day | ORAL | 0 refills | Status: DC

## 2021-12-30 NOTE — ED Triage Notes (Signed)
Pt presents via ems with c/o nausea, vomiting, and near syncope episode today. Pt reports throwing up x1 day. Pt agitated in triage yelling at this rn to hurry up and get blanket for her. Pt denies abdominal pain, chest pain, or shob.

## 2021-12-30 NOTE — Telephone Encounter (Signed)
Pt was warm transferred to me stating the last few weeks she has been having exhaustion when going outside in the heat. She has stopped going to the grocery store with her aide. In the last 2-3 days, she has had increased shortness of breath (could hear this while talking to her), fatigue, no energy, sleeping during the day, and ankle swelling. She also mentioned she has been waking up in the middle of the night with a rapid heart rate. She knows Dr Silvio Pate diagnosed her with CHF a few years back. She was wondering if she could be having AFib and made an appt with Dr Silvio Pate on 01-20-22. I advised that she did not need to wait that long. She agreed. I placed her on hold and called Dr Silvio Pate at home to see what he would like to do. He ordered Lasix 40 mg 1 immediately when she gets it today and take another in the morning. Made her an appt with Dr Silvio Pate tomorrow (8/17) at 145 with the agreement that if she worsens, she will go to the ER. I placed her on hold again to call [facility] to see if they had made their delivery to [facility] yet. [facility] said they will go at 530 and to go ahead and send the rx. Per orders of Dr Silvio Pate, rx for Lasix '40mg'$  1 tab by mouth daily #30 was sent to [facility]. She appreciated myself and Dr Silvio Pate for the concern and care.

## 2021-12-30 NOTE — ED Triage Notes (Signed)
EMS brings pt in from [facility] independent for c/o near-syncope while in [place]

## 2021-12-31 ENCOUNTER — Ambulatory Visit (INDEPENDENT_AMBULATORY_CARE_PROVIDER_SITE_OTHER): Payer: Medicare Other | Admitting: Internal Medicine

## 2021-12-31 ENCOUNTER — Encounter: Payer: Self-pay | Admitting: Internal Medicine

## 2021-12-31 VITALS — BP 136/64 | HR 70 | Temp 97.6°F | Ht 64.0 in | Wt 139.0 lb

## 2021-12-31 DIAGNOSIS — R002 Palpitations: Secondary | ICD-10-CM | POA: Diagnosis not present

## 2021-12-31 DIAGNOSIS — R0609 Other forms of dyspnea: Secondary | ICD-10-CM

## 2021-12-31 DIAGNOSIS — I5032 Chronic diastolic (congestive) heart failure: Secondary | ICD-10-CM

## 2021-12-31 MED ORDER — FUROSEMIDE 40 MG PO TABS: 40.0000 mg | ORAL_TABLET | Freq: Every day | ORAL | 1 refills | Status: DC | PRN

## 2021-12-31 NOTE — Assessment & Plan Note (Signed)
Now with apparent exacerbation--but hard to tell Discussed weighing daily----will hold off on the furosemide unless weight is up

## 2021-12-31 NOTE — Assessment & Plan Note (Signed)
I am concerned that she had a silent MI Not sure what her EF is like now Troponin borderline---lightly no acute MI Will set up with cardiology ---will at least need echo to determine function

## 2021-12-31 NOTE — Assessment & Plan Note (Signed)
She is concerned about atrial fib--but regular here May need holter monitor to determine rhythm

## 2021-12-31 NOTE — Progress Notes (Signed)
Subjective:    Patient ID: Tiffany Martinez, female    DOB: November 30, 1933, 88 y.o.   MRN: [id number]  HPI Here due to palpitations and increased SOB Went to ER last night but left before evaluation  Passed out last night  Found by pharmacy person delivering the med Then EMS brought her to ER  Worried about dehydration recently--due to not passing much fluid So she increased her fluid intake in the past 3-4 days Worsened SOB--with anything that upsets her Also with minimal activity---and will have to sit down Heart waking her up from beating fast--did seem irregular No edema  Did take furosemide last night and this morning---and did diurese this AM Some chest pain---"tight" feeling  Current Outpatient Medications on File Prior to Visit  Medication Sig Dispense Refill   amLODipine (NORVASC) 5 MG tablet Take 1 tablet (5 mg total) by mouth daily. 90 tablet 3   atorvastatin (LIPITOR) 20 MG tablet Take 1 tablet (20 mg total) by mouth at bedtime.     furosemide (LASIX) 40 MG tablet Take 1 tablet (40 mg total) by mouth daily. 30 tablet 0   lactose free nutrition (BOOST) LIQD Take 237 mLs by mouth daily as needed.     loratadine (CLARITIN) 10 MG tablet Take 10 mg by mouth daily.     losartan (COZAAR) 100 MG tablet Take 1 tablet (100 mg total) by mouth daily. 90 tablet 3   No current facility-administered medications on file prior to visit.    Allergies  Allergen Reactions   Triamterene Hives    Past Medical History:  Diagnosis Date   AAA (abdominal aortic aneurysm) ([place]) 2012   3.9cm, rec rpt 1 yr   Anemia    Anxiety    CHF (congestive heart failure) (HCC)    Chronic venous insufficiency    CLL (chronic lymphocytic leukemia) ([place]) 2014   Stage 0--by flow cytometry   Hx of colonoscopy    Hyperlipidemia    Hypertension    Melanoma ([place])    Right Arm   Migraine headache    3-4X/yr   PAD (peripheral artery disease) ([place]) 2012   severe R common iliac stenosis, ABI 0.73, mid  L common iliac stenosis ABI 1.1   PAD (peripheral artery disease) ([place])    Wears dentures    full upper and lower    Past Surgical History:  Procedure Laterality Date   ABDOMINAL AORTIC ANEURYSM REPAIR  02/16/12   [facility]--Dr Sammuel Hines   CARDIOVASCULAR STRESS TEST  2008   negative   CATARACT EXTRACTION W/PHACO Left 07/16/2019   Procedure: CATARACT EXTRACTION PHACO AND INTRAOCULAR LENS PLACEMENT (IOC) LEFT 8.26  00:46.6;  Surgeon: Eulogio Bear, MD;  Location: [place];  Service: Ophthalmology;  Laterality: Left;   DOBUTAMINE STRESS ECHO  11/11   negative   HIP ARTHROPLASTY Right 10/10/2019   Procedure: ARTHROPLASTY BIPOLAR HIP (HEMIARTHROPLASTY);  Surgeon: Corky Mull, MD;  Location: [facility];  Service: Orthopedics;  Laterality: Right;   KNEE ARTHROSCOPY  1998   right   MELANOMA EXCISION  11/09/10   wide excision right upper arm   SQUAMOUS CELL CARCINOMA EXCISION  02/2016   TONSILLECTOMY AND ADENOIDECTOMY  1960    Family History  Problem Relation Age of Onset   Alzheimer's disease Mother    Pancreatic cancer Father    Parkinson's disease Brother    Coronary artery disease Neg Hx    Diabetes Neg Hx    Cancer Neg Hx  breast or colon    Social History   Socioeconomic History   Marital status: Widowed    Spouse name: Not on file   Number of children: 4   Years of education: Not on file   Highest education level: Not on file  Occupational History   Occupation: retired- Therapist, sports,   Tobacco Use   Smoking status: Former    Types: Cigarettes    Quit date: 03/17/2021    Years since quitting: 0.7   Smokeless tobacco: Never   Tobacco comments:    occassionally  Vaping Use   Vaping Use: Never used  Substance and Sexual Activity   Alcohol use: Yes    Alcohol/week: 7.0 standard drinks of alcohol    Types: 7 Glasses of wine per week    Comment: regular wine with dinner   Drug use: No   Sexual activity: Never  Other Topics Concern   Not on file  Social History  Narrative   Widowed 2/11 -2nd for him , 1st for her. 4 step sons. 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 No PND Hit right ribs on rollator--thinks she may have bruised them     Objective:   Physical Exam Constitutional:      Appearance: Normal appearance.  Cardiovascular:     Rate and Rhythm: Normal rate and regular rhythm.     Heart sounds: No murmur heard.    No gallop.     Comments: Faint pedal pulses Pulmonary:     Effort: Pulmonary effort is normal.     Breath sounds: Normal breath sounds. No wheezing or rales.  Musculoskeletal:     Cervical back: Neck supple.     Right lower leg: No edema.     Left lower leg: No edema.  Lymphadenopathy:     Cervical: No cervical adenopathy.  Neurological:     Mental Status: She is alert.            Assessment & Plan:

## 2021-12-31 NOTE — Patient Instructions (Signed)
Please weigh yourself daily. Take the furosemide if you have any leg swelling or your weight is up.

## 2022-01-01 ENCOUNTER — Telehealth: Payer: Self-pay | Admitting: Cardiovascular Disease

## 2022-01-01 NOTE — Telephone Encounter (Signed)
Spoke with the patient. Patient was trasnported to the ED on 8/16 Patient left [facility] ED before being evaluated by a Provider.  Patient was last seen by Dr. Rockey Situ in Sept 2021.  Patient sts that her pcp Dr. Silvio Pate recently prescribed Lasix 40 mg daily prn. Has has taken the medication 2 days in a row. Pt reports intermittent dizziness.  Pt lives at [facility]. She is wanting to know what to do going in to the weekend.  Adv the pt to reach out to the Nursing staff at [facility]. To check her VS. Adv the pt to also contact Dr. Silvio Pate to provide an update.  Patient is scheduled with our office on 01/06/22. Adv the pt to keep that appt. Pt verbalized understanding and voiced appreciation for the assistance

## 2022-01-01 NOTE — Telephone Encounter (Signed)
Patient states she didn't receive any instructions upon discharge and would like to know if there is anything she should be doing. Please advise.

## 2022-01-04 ENCOUNTER — Ambulatory Visit: Payer: Medicare Other | Admitting: Physician Assistant

## 2022-01-05 NOTE — Progress Notes (Unsigned)
Cardiology Clinic Note   Patient Name: Tiffany Martinez Date of Encounter: 01/06/2022  Primary Care Provider:  Venia Carbon, MD Primary Cardiologist:  Ida Rogue, MD  Patient Profile    88 year old female with a past medical history of PAD, severe right common iliac stenosis, infrarenal AAA, right common iliac aneurysm, COPD, diastolic congestive heart failure, hypertensive heart disease, who follows up today after a recent visit to the emergency department for syncope with collapse.   Past Medical History    Past Medical History:  Diagnosis Date   AAA (abdominal aortic aneurysm) ([place]) 2012   3.9cm, rec rpt 1 yr   Anemia    Anxiety    CHF (congestive heart failure) (HCC)    Chronic venous insufficiency    CLL (chronic lymphocytic leukemia) ([place]) 2014   Stage 0--by flow cytometry   Hx of colonoscopy    Hyperlipidemia    Hypertension    Melanoma ([place])    Right Arm   Migraine headache    3-4X/yr   PAD (peripheral artery disease) ([place]) 2012   severe R common iliac stenosis, ABI 0.73, mid L common iliac stenosis ABI 1.1   PAD (peripheral artery disease) ([place])    Wears dentures    full upper and lower   Past Surgical History:  Procedure Laterality Date   ABDOMINAL AORTIC ANEURYSM REPAIR  02/16/12   [facility]--Dr Sammuel Hines   CARDIOVASCULAR STRESS TEST  2008   negative   CATARACT EXTRACTION W/PHACO Left 07/16/2019   Procedure: CATARACT EXTRACTION PHACO AND INTRAOCULAR LENS PLACEMENT (IOC) LEFT 8.26  00:46.6;  Surgeon: Eulogio Bear, MD;  Location: [facility];  Service: Ophthalmology;  Laterality: Left;   DOBUTAMINE STRESS ECHO  11/11   negative   HIP ARTHROPLASTY Right 10/10/2019   Procedure: ARTHROPLASTY BIPOLAR HIP (HEMIARTHROPLASTY);  Surgeon: Corky Mull, MD;  Location: [facility];  Service: Orthopedics;  Laterality: Right;   KNEE ARTHROSCOPY  1998   right   MELANOMA EXCISION  11/09/10   wide excision right upper arm   SQUAMOUS CELL CARCINOMA EXCISION   02/2016   TONSILLECTOMY AND ADENOIDECTOMY  1960    Allergies  Allergies  Allergen Reactions   Triamterene Hives    History of Present Illness    88 year old female with past medical history-bilateral PAD, severe right common iliac stenosis, infrarenal AAA, right, iliac aneurysm, COPD, diastolic congestive heart failure, and  hypertensive heart disease.    Evaluated in the office in 2012 for lower extremity PAD.  Abdominal aortic duplex and ABIs showed severe right common iliac stenosis and an ABI of 0.73 on the right with no significant disease on the left and a normal ABI on that side of his also notation of a 3.9 cm infrarenal AAA.  There was greater than 70% ostial right common iliac stenosis and a right common iliac aneurysm about 2 cm in diameter.  In June 2021 she presented to the emergency department via EMS from rehab center with complaint of a fall with a right ankle injury.  Was noted to have elevated blood pressure systolic blood pressures of up to 200.  She had outpatient follow-up after that emergency department visit.  Last echocardiogram was completed 09/2017 with an LVEF of 60 to 65%, no wall motion abnormalities were noted, grade 2 diastolic dysfunction, and mild aortic regurgitation.  She returns to clinic today after leaving the emergency department AMA.  She states she has had waited for 6 hours prior to any care and  decided to leave after she had an episode of syncope with collapse at home. Since that time she has noticed worsening fatigue and worsening shortness of breath over the last few months.  This is also exacerbated balance issues that she had had previously.  She states she has been weak in her legs until about midmorning to around lunch before she can stand and actually walk around she is also noted increased amount of palpitations.  Currently denies any chest pain or chest pressure.  Home Medications    Current Outpatient Medications  Medication Sig Dispense  Refill   amLODipine (NORVASC) 5 MG tablet Take 1 tablet (5 mg total) by mouth daily. 90 tablet 3   atorvastatin (LIPITOR) 20 MG tablet Take 1 tablet (20 mg total) by mouth at bedtime.     furosemide (LASIX) 40 MG tablet Take 1 tablet (40 mg total) by mouth daily as needed. If weight is up or for fluid 30 tablet 1   lactose free nutrition (BOOST) LIQD Take 237 mLs by mouth daily as needed.     loratadine (CLARITIN) 10 MG tablet Take 10 mg by mouth daily.     losartan (COZAAR) 100 MG tablet Take 1 tablet (100 mg total) by mouth daily. 90 tablet 3   Multiple Vitamin (MULTIVITAMIN ADULT PO) Take 1 tablet by mouth daily.     potassium chloride SA (KLOR-CON M) 20 MEQ tablet Take 20 mEq by mouth daily.     No current facility-administered medications for this visit.     Family History    Family History  Problem Relation Age of Onset   Alzheimer's disease Mother    Pancreatic cancer Father    Parkinson's disease Brother    Coronary artery disease Neg Hx    Diabetes Neg Hx    Cancer Neg Hx        breast or colon   She indicated that her mother is deceased. She indicated that her father is deceased. She indicated that both of her brothers are alive. She indicated that the status of her neg hx is unknown.  Social History    Social History   Socioeconomic History   Marital status: Widowed    Spouse name: Not on file   Number of children: 4   Years of education: Not on file   Highest education level: Not on file  Occupational History   Occupation: retired- Therapist, sports,   Tobacco Use   Smoking status: Former    Types: Cigarettes    Quit date: 03/17/2021    Years since quitting: 0.8   Smokeless tobacco: Never   Tobacco comments:    occassionally  Vaping Use   Vaping Use: Never used  Substance and Sexual Activity   Alcohol use: Yes    Alcohol/week: 7.0 standard drinks of alcohol    Types: 7 Glasses of wine per week    Comment: regular wine with dinner   Drug use: No   Sexual activity:  Never  Other Topics Concern   Not on file  Social History Narrative   Widowed 2/11 -2nd for him , 1st for her. 4 step sons. 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 Endorses fatigue. Cardiovascular:  No chest pain, endorses dyspnea on exertion, endorses edema, but denies orthopnea, palpitations, paroxysmal nocturnal dyspnea. Dermatological: No rash, lesions/masses Respiratory: No cough, endorses dyspnea Urologic: No hematuria, dysuria Abdominal:   No nausea, vomiting, diarrhea, bright red blood per rectum, melena, or hematemesis Neurologic:  No visual changes, wkns, changes in mental status. All other systems reviewed and are otherwise negative except as noted above.   Physical Exam    VS:  BP (!) 140/60 (BP Location: Left Arm)   Pulse 68   Ht '5\' 4"'$  (1.626 m)   Wt 142 lb 6.4 oz (64.6 kg)   LMP  (LMP Unknown)   SpO2 98%   BMI 24.44 kg/m  , BMI Body mass index is 24.44 kg/m.     Vitals:   01/06/22 1106 01/06/22 1141  BP: (!) 142/56 (!) 140/60   GEN: Well nourished, well developed, in no acute distress. HEENT: normal. Neck: Supple, no JVD, carotid bruits, or masses. Cardiac: RRR, no murmurs, rubs, or gallops. No clubbing, cyanosis, edema.  Radials/DP/PT 2+ and equal bilaterally.  Respiratory:  Respirations regular and unlabored, clear to auscultation bilaterally. GI: Soft, nontender,  nondistended, BS + x 4. MS: no deformity or atrophy. Skin: warm and dry, no rash. Neuro:  Strength and sensation are intact. Psych: Normal affect.  Accessory Clinical Findings    ECG personally reviewed by me today- sinus rate of 68, LVH with early repolarization, left axis deviation - No acute changes  Lab Results  Component Value Date   WBC 24.8 (H) 12/30/2021   HGB 11.1 (L) 12/30/2021   HCT 31.8 (L) 12/30/2021   MCV 108.5 (H) 12/30/2021   PLT 149 (L) 12/30/2021   Lab Results  Component Value Date   CREATININE 1.44 (H) 12/30/2021   BUN 29 (H) 12/30/2021   NA 137 12/30/2021   K 3.8 12/30/2021   CL 108 12/30/2021   CO2 21 (L) 12/30/2021   Lab Results  Component Value Date   ALT 14 12/30/2021   AST 22 12/30/2021   GGT 26 03/30/2012   ALKPHOS 73 12/30/2021   BILITOT 0.9 12/30/2021   Lab Results  Component Value Date   CHOL 164 02/21/2020   HDL 34.40 (L) 02/21/2020   LDLCALC 91 02/21/2020   LDLDIRECT 89.7 01/15/2014   TRIG 192.0 (H) 02/21/2020   CHOLHDL 5 02/21/2020    Lab Results  Component Value Date   HGBA1C 4.9 04/03/2021    Assessment & Plan   Syncope with collapse patient states when she had woken she had found himself lying on the kitchen floor.  She states that she did not hit her head that she is aware of but is unsure.  She was taken to the emergency department where she waited for extended period of time she stated without adequate treatment so she was feeling better had not had any further episodes so she left and went back home.  She did have some blood work that was completed but no further testing was done while she was in the emergency department.  She has been scheduled for a  ZIO XT for 2 weeks to rule out any arrhythmias.  2.  Chronic diastolic heart failure with some shortness of breath over the last few weeks and worsening fatigue over the last few months.  She does appear to be euvolemic on exam.  Last echocardiogram was done in 2019 which  revealed grade 2 diastolic dysfunction mild aortic regurgitation.  Repeat echocardiogram has been ordered for his symptoms.  3.  Peripheral arterial disease with abdominal aortic aneurysm severe right common iliac stenosis, for renal AAA, right common iliac aneurysm, and right internal iliac artery remains occluded at its origin but reconstitutes distally unchanged on CT from 2017.  Patient states this has been followed by her internal medicine doctor for greater than 15 years.  4.  Essential hypertension with blood pressure today 142/56, which is stable for today.  She is continue amlodipine 5 mg daily, furosemide 40 mg as needed and losartan 100 mg daily.  Also been advised to continue to maintain a monitor at home.  There were no changes made to her medication regimen today.  5.  COPD with long history of smoking and chronic cough likely with chronic bronchitis who presents today with shortness of breath.  She has been scheduled for an echocardiogram.  Disposition: Patient is to return to clinic to see MD/APP in 6 weeks after tests have resulted.     Rosalie Buenaventura, NP 01/06/2022, 1:31 PM

## 2022-01-06 ENCOUNTER — Encounter: Payer: Self-pay | Admitting: Cardiology

## 2022-01-06 ENCOUNTER — Ambulatory Visit (INDEPENDENT_AMBULATORY_CARE_PROVIDER_SITE_OTHER): Payer: Medicare Other | Admitting: Cardiology

## 2022-01-06 ENCOUNTER — Ambulatory Visit (INDEPENDENT_AMBULATORY_CARE_PROVIDER_SITE_OTHER): Payer: Medicare Other

## 2022-01-06 VITALS — BP 140/60 | HR 68 | Ht 64.0 in | Wt 142.4 lb

## 2022-01-06 DIAGNOSIS — I10 Essential (primary) hypertension: Secondary | ICD-10-CM

## 2022-01-06 DIAGNOSIS — R002 Palpitations: Secondary | ICD-10-CM | POA: Diagnosis not present

## 2022-01-06 DIAGNOSIS — I7143 Infrarenal abdominal aortic aneurysm, without rupture: Secondary | ICD-10-CM

## 2022-01-06 DIAGNOSIS — I5032 Chronic diastolic (congestive) heart failure: Secondary | ICD-10-CM | POA: Diagnosis not present

## 2022-01-06 DIAGNOSIS — R55 Syncope and collapse: Secondary | ICD-10-CM

## 2022-01-06 DIAGNOSIS — I739 Peripheral vascular disease, unspecified: Secondary | ICD-10-CM | POA: Diagnosis not present

## 2022-01-06 DIAGNOSIS — R0602 Shortness of breath: Secondary | ICD-10-CM

## 2022-01-06 NOTE — Patient Instructions (Signed)
Medication Instructions:   Your physician recommends that you continue on your current medications as directed. Please refer to the Current Medication list given to you today.  *If you need a refill on your cardiac medications before your next appointment, please call your pharmacy*   Testing/Procedures:  Your physician has requested that you have an echocardiogram. Echocardiography is a painless test that uses sound waves to create images of your heart. It provides your doctor with information about the size and shape of your heart and how well your heart's chambers and valves are working. This procedure takes approximately one hour. There are no restrictions for this procedure.  2.   Your physician has recommended that you wear a Zio XT monitor for 2 weeks. This will be mailed to your home address in 4-5 business days.   Your clinician has requested a Zio heart rhythm monitor by [employer] to be mailed to your home for you to wear for 14 days. You should expect a small box to arrive via USPS (or FedEx in some cases) within this next week. If you do not receive it please call [employer] at [phone].  Closely watching your heart at this time will help your care team understand more and provide information needed to develop your plan of care.  Please apply your Zio patch monitor the day you receive it. Keep this packaging, you will use this to return your Zio monitor.  You will easily be able to apply the monitor with the instructions provided in the Patient Guide.  If you need assistance, [employer] representatives are available 24/7 at [phone].  You can also download the [facility] app on your phone to view detailed application instructions and log symptoms.  After you wear your monitor for 14 days, place it back in the blue box or envelope, along with your Symptom Log.  To send your monitor back: Simply use the pre-addressed and pre-paid box/envelope.  Send it back through [employer] the  same day you remove it via your local post office or by placing it in your mailbox.  As soon as we receive the results, they will be reviewed and your clinician will contact you.  For the first 24 hours- it is essential to not shower or exercise, to allow the patch to adhere to your skin. Avoid excessive sweating to help maximize wear time. Do not submerge the device, no hot tubs, and no swimming pools. Keep any lotions or oils away from the patch. After 24 hours you may shower with the patch on. Take brief showers with your back facing the shower head.  Do not remove patch once it has been placed because that will interrupt data and decrease adhesive wear time. Push the button when you have any symptoms and write down what you were feeling. Once you have completed wearing your monitor, remove and place into box which has postage paid and place in your outgoing mailbox.  If for some reason you have misplaced your box then call our office and we can provide another box and/or mail it off for you.   Follow-Up: At [facility], you and your health needs are our priority.  As part of our continuing mission to provide you with exceptional heart care, we have created designated Provider Care Teams.  These Care Teams include your primary Cardiologist (physician) and Advanced Practice Providers (APPs -  Physician Assistants and Nurse Practitioners) who all work together to provide you with the care you need, when you need  it.  We recommend signing up for the patient portal called "MyChart".  Sign up information is provided on this After Visit Summary.  MyChart is used to connect with patients for Virtual Visits (Telemedicine).  Patients are able to view lab/test results, encounter notes, upcoming appointments, etc.  Non-urgent messages can be sent to your provider as well.   To learn more about what you can do with MyChart, go to [email or web address].    Your next appointment:   6 week(s)  The  format for your next appointment:   In Person  Provider:   You may see Ida Rogue, MD or one of the following Advanced Practice Providers on your designated Care Team:   Gerrie Nordmann, NP   Important Information About [place]

## 2022-01-10 ENCOUNTER — Emergency Department
Admission: EM | Admit: 2022-01-10 | Discharge: 2022-01-11 | Disposition: A | Discharge status: Home / Self Care | Payer: Medicare Other | Attending: Emergency Medicine | Admitting: Emergency Medicine

## 2022-01-10 ENCOUNTER — Other Ambulatory Visit: Payer: Self-pay

## 2022-01-10 ENCOUNTER — Emergency Department: Payer: Medicare Other

## 2022-01-10 DIAGNOSIS — D72829 Elevated white blood cell count, unspecified: Secondary | ICD-10-CM | POA: Diagnosis not present

## 2022-01-10 DIAGNOSIS — R069 Unspecified abnormalities of breathing: Secondary | ICD-10-CM | POA: Diagnosis not present

## 2022-01-10 DIAGNOSIS — M5136 Other intervertebral disc degeneration, lumbar region: Secondary | ICD-10-CM | POA: Diagnosis not present

## 2022-01-10 DIAGNOSIS — N189 Chronic kidney disease, unspecified: Secondary | ICD-10-CM | POA: Diagnosis not present

## 2022-01-10 DIAGNOSIS — R001 Bradycardia, unspecified: Secondary | ICD-10-CM | POA: Diagnosis not present

## 2022-01-10 DIAGNOSIS — Z20822 Contact with and (suspected) exposure to covid-19: Secondary | ICD-10-CM | POA: Insufficient documentation

## 2022-01-10 DIAGNOSIS — R1111 Vomiting without nausea: Secondary | ICD-10-CM | POA: Diagnosis not present

## 2022-01-10 DIAGNOSIS — I10 Essential (primary) hypertension: Secondary | ICD-10-CM | POA: Diagnosis not present

## 2022-01-10 DIAGNOSIS — K551 Chronic vascular disorders of intestine: Secondary | ICD-10-CM | POA: Insufficient documentation

## 2022-01-10 DIAGNOSIS — D259 Leiomyoma of uterus, unspecified: Secondary | ICD-10-CM | POA: Diagnosis not present

## 2022-01-10 DIAGNOSIS — J449 Chronic obstructive pulmonary disease, unspecified: Secondary | ICD-10-CM | POA: Insufficient documentation

## 2022-01-10 DIAGNOSIS — R0602 Shortness of breath: Secondary | ICD-10-CM | POA: Diagnosis not present

## 2022-01-10 DIAGNOSIS — N281 Cyst of kidney, acquired: Secondary | ICD-10-CM | POA: Diagnosis not present

## 2022-01-10 DIAGNOSIS — K449 Diaphragmatic hernia without obstruction or gangrene: Secondary | ICD-10-CM | POA: Diagnosis not present

## 2022-01-10 LAB — URINALYSIS, ROUTINE W REFLEX MICROSCOPIC
Bilirubin Urine: NEGATIVE
Glucose, UA: NEGATIVE mg/dL
Hgb urine dipstick: NEGATIVE
Ketones, ur: NEGATIVE mg/dL
Leukocytes,Ua: NEGATIVE
Nitrite: NEGATIVE
Protein, ur: 100 mg/dL — AB
Specific Gravity, Urine: 1.011 (ref 1.005–1.030)
Squamous Epithelial / HPF: NONE SEEN (ref 0–5)
pH: 5 (ref 5.0–8.0)

## 2022-01-10 LAB — CBC
HCT: 33.7 % — ABNORMAL LOW (ref 36.0–46.0)
Hemoglobin: 11.5 g/dL — ABNORMAL LOW (ref 12.0–15.0)
MCH: 36.2 pg — ABNORMAL HIGH (ref 26.0–34.0)
MCHC: 34.1 g/dL (ref 30.0–36.0)
MCV: 106 fL — ABNORMAL HIGH (ref 80.0–100.0)
Platelets: 155 10*3/uL (ref 150–400)
RBC: 3.18 MIL/uL — ABNORMAL LOW (ref 3.87–5.11)
RDW: 14.6 % (ref 11.5–15.5)
WBC: 26.4 10*3/uL — ABNORMAL HIGH (ref 4.0–10.5)
nRBC: 0.1 % (ref 0.0–0.2)

## 2022-01-10 LAB — COMPREHENSIVE METABOLIC PANEL
ALT: 13 U/L (ref 0–44)
AST: 29 U/L (ref 15–41)
Albumin: 3.9 g/dL (ref 3.5–5.0)
Alkaline Phosphatase: 65 U/L (ref 38–126)
Anion gap: 9 (ref 5–15)
BUN: 36 mg/dL — ABNORMAL HIGH (ref 8–23)
CO2: 22 mmol/L (ref 22–32)
Calcium: 9.8 mg/dL (ref 8.9–10.3)
Chloride: 108 mmol/L (ref 98–111)
Creatinine, Ser: 1.47 mg/dL — ABNORMAL HIGH (ref 0.44–1.00)
GFR, Estimated: 35 mL/min — ABNORMAL LOW (ref >60)
Glucose, Bld: 134 mg/dL — ABNORMAL HIGH (ref 70–99)
Potassium: 3.8 mmol/L (ref 3.5–5.1)
Sodium: 139 mmol/L (ref 135–145)
Total Bilirubin: 0.8 mg/dL (ref 0.3–1.2)
Total Protein: 6.8 g/dL (ref 6.5–8.1)

## 2022-01-10 LAB — LACTIC ACID, PLASMA: Lactic Acid, Venous: 1.2 mmol/L (ref 0.5–1.9)

## 2022-01-10 LAB — LIPASE, BLOOD: Lipase: 42 U/L (ref 11–51)

## 2022-01-10 LAB — TROPONIN I (HIGH SENSITIVITY)
Troponin I (High Sensitivity): 13 ng/L (ref <18)
Troponin I (High Sensitivity): 14 ng/L (ref <18)

## 2022-01-10 LAB — SARS CORONAVIRUS 2 BY RT PCR: SARS Coronavirus 2 by RT PCR: NEGATIVE

## 2022-01-10 MED ORDER — IOHEXOL 350 MG/ML SOLN
75.0000 mL | Freq: Once | INTRAVENOUS | Status: AC | PRN
  Administered 2022-01-10: 75 mL via INTRAVENOUS

## 2022-01-10 MED ORDER — LACTATED RINGERS IV BOLUS
500.0000 mL | Freq: Once | INTRAVENOUS | Status: AC
  Administered 2022-01-10: 500 mL via INTRAVENOUS

## 2022-01-10 NOTE — Progress Notes (Signed)
      Daily Progress Note  I was asked by Dr. Joni Fears [facility] ED) to review pt's CTA abd/pelvis: - prior EVAR - Pt has obvious CA and SMA disease  - CA imaging is incomplete with persistent disease into CA branches with obvious atherosclerotic disease distally  - SMA has either STO vs CTO in proximal segment - IMA remains occluded by EVAR - Femoral disease as documented  Quick review of chart reviews: - Pt's EVAR was completed by Dr. Sammuel Hines - Femoral findings noted today were presented previously - Doubt pt is a candidate for an open mesenteric bypass given her age and co-morbidities - Prior episode of recurrent viral gastroenteritis are documented   Adele Barthel, MD, FACS, FSVS Covering for [facility]: [phone]  01/10/2022, 10:35 PM

## 2022-01-10 NOTE — ED Triage Notes (Signed)
Pt also endorses pain in epigastric area and then has vomiting episodes.

## 2022-01-10 NOTE — ED Notes (Signed)
EDP [place] in room at this time.

## 2022-01-10 NOTE — ED Notes (Signed)
Pt reports intermittent upper abdominal pain over the past couple of weeks with multiple episodes of syncope/LOC. Denies urinary symptoms. Ambulates with a walker at baseline. A/O x 4

## 2022-01-10 NOTE — ED Provider Notes (Signed)
[facility] Provider Note    Event Date/Time   First MD Initiated Contact with Patient 01/10/22 1717     (approximate)   History   Chief Complaint: Shortness of Breath   HPI  Tiffany Martinez is a 88 y.o. female with a history of CLL, COPD, CKD, abdominal aortic aneurysm, hypertension, prior aortobifemoral stent who comes the ED complaining of epigastric pain and vomiting that started this morning after eating.  She has had a prior episode of this about 1 week ago.  She was also seen about a week ago at cardiology due to syncope.  She was ordered a ZIO heart monitor which she brings with her to the emergency department.  Denies any chest pain or shortness of breath.  Denies any palpitations.     Physical Exam   Triage Vital Signs: ED Triage Vitals  Enc Vitals Group     BP 01/10/22 1703 (!) 191/60     Pulse Rate 01/10/22 1703 (!) 59     Resp 01/10/22 1703 19     Temp 01/10/22 1703 98.7 F (37.1 C)     Temp Source 01/10/22 1703 Oral     SpO2 01/10/22 1703 96 %     Weight 01/10/22 1559 141 lb (64 kg)     Height 01/10/22 1559 '5\' 4"'$  (1.626 m)     Head Circumference --      Peak Flow --      Pain Score 01/10/22 1559 9     Pain Loc --      Pain Edu? --      Excl. in [place]? --     Most recent vital signs: Vitals:   01/10/22 2329 01/11/22 0000  BP: (!) 147/81 (!) 167/69  Pulse: (!) 59 79  Resp: 16 20  Temp:    SpO2: 93% 93%    General: Awake, no distress.  CV:  Good peripheral perfusion.  Regular rate and rhythm.  Symmetric peripheral pulses. Resp:  Normal effort.  Clear to auscultation bilaterally Abd:  No distention.  Soft and nontender.  No pulsatile mass Other:  No lower extremity edema.   ED Results / Procedures / Treatments   Labs (all labs ordered are listed, but only abnormal results are displayed) Labs Reviewed  CBC - Abnormal; Notable for the following components:      Result Value   WBC 26.4 (*)    RBC 3.18 (*)    Hemoglobin  11.5 (*)    HCT 33.7 (*)    MCV 106.0 (*)    MCH 36.2 (*)    All other components within normal limits  COMPREHENSIVE METABOLIC PANEL - Abnormal; Notable for the following components:   Glucose, Bld 134 (*)    BUN 36 (*)    Creatinine, Ser 1.47 (*)    GFR, Estimated 35 (*)    All other components within normal limits  URINALYSIS, ROUTINE W REFLEX MICROSCOPIC - Abnormal; Notable for the following components:   Color, Urine YELLOW (*)    APPearance HAZY (*)    Protein, ur 100 (*)    Bacteria, UA FEW (*)    All other components within normal limits  SARS CORONAVIRUS 2 BY RT PCR  LIPASE, BLOOD  LACTIC ACID, PLASMA  TROPONIN I (HIGH SENSITIVITY)  TROPONIN I (HIGH SENSITIVITY)     EKG Interpreted by me Sinus bradycardia rate of 49.  Normal axis and intervals.  Poor R wave progression.  Normal ST segments and T waves.  RADIOLOGY Chest x-ray interpreted by me, appears normal.  Radiology report reviewed.  CT angio abdomen pelvis shows chronic severe stenotic disease of multiple abdominal arteries.  Aortic stent patent   PROCEDURES:  Procedures   MEDICATIONS ORDERED IN ED: Medications  lactated ringers bolus 500 mL (0 mLs Intravenous Stopped 01/10/22 1941)  iohexol (OMNIPAQUE) 350 MG/ML injection 75 mL (75 mLs Intravenous Contrast Given 01/10/22 1918)     IMPRESSION / MDM / ASSESSMENT AND PLAN / ED COURSE  I reviewed the triage vital signs and the nursing notes.                              Differential diagnosis includes, but is not limited to, intestinal angina, mesenteric ischemia, pancreatitis, diverticulitis, gastritis, viral illness  Patient's presentation is most consistent with acute presentation with potential threat to life or bodily function.  Patient presents with abdominal pain and vomiting in the setting of known severe abdominal arterial stenosis.  She is currently feeling better in the ED.  Vital signs unremarkable.  Labs unremarkable with stable CKD.   Stable leukocytosis related to CLL.  COVID and urinalysis are normal, lactate and troponin are normal.  CT findings discussed with vascular surgery Dr. Bridgett Larsson who notes that the absence of any ischemic findings, normal lactate, no acidosis on labs are reassuring and patient should follow-up with her [facility] vascular surgeon Dr. Sammuel Hines.  Patient was given a sandwich and coffee for p.o. trial which she was able to eat quickly without symptoms.  She does not require admission and can be discharged home.  Return precautions discussed.  She was assisted with placing the Zio patch for continued cardiology evaluation.      FINAL CLINICAL IMPRESSION(S) / ED DIAGNOSES   Final diagnoses:  Chronic mesenteric ischemia ([place])     Rx / DC Orders   ED Discharge Orders          Ordered    ondansetron (ZOFRAN-ODT) 4 MG disintegrating tablet  Every 8 hours PRN        01/11/22 0017             Note:  This document was prepared using Dragon voice recognition software and may include unintentional dictation errors.   Carrie Mew, MD 01/11/22 0030

## 2022-01-10 NOTE — ED Notes (Signed)
Obtained urine sample and sent to lab.

## 2022-01-10 NOTE — ED Triage Notes (Signed)
Pt in via EMS from home with c/o vomiting since this am. Pt with similar episode last week, came here and left without being seen. Pt saw her MD who thinks she may have had a cardiac event and gave her a halter monitor order but she has not had it placed yet. Pt got sick this am after eating. 189/118. CBD 163, 98.1 temp. Pt was given '4mg'$  of zofran and #20g to right wrist

## 2022-01-10 NOTE — ED Notes (Signed)
Pt dizzy upon standing.  Assisted to stand and pivot to [facility] by this RN.

## 2022-01-10 NOTE — ED Notes (Signed)
Pt lactic recollected per lab request.

## 2022-01-10 NOTE — ED Notes (Signed)
Pt given coffee and sandwich tray for PO challenge.

## 2022-01-10 NOTE — ED Notes (Signed)
Pt taken to CT at this time.

## 2022-01-11 MED ORDER — ONDANSETRON 4 MG PO TBDP: 4.0000 mg | ORAL_TABLET | Freq: Three times a day (TID) | ORAL | 0 refills | Status: DC | PRN

## 2022-01-11 NOTE — ED Notes (Signed)
Report given to [place] at [facility] [phone].

## 2022-01-11 NOTE — ED Notes (Signed)
Per Francee Piccolo with [facility] security at [phone], they will be able to pick patient up and return to facility. Will call when on the way.

## 2022-01-11 NOTE — ED Notes (Signed)
Pt w/no pain or nausea at this time post PO challenge.  [place] notified.

## 2022-01-13 ENCOUNTER — Telehealth: Payer: Self-pay | Admitting: Internal Medicine

## 2022-01-13 NOTE — Telephone Encounter (Signed)
Patient called in asking what was her diagnosis from her hospital visit on 8/27. She would like a phone call with this information.

## 2022-01-13 NOTE — Telephone Encounter (Signed)
Spoke to pt. She is asking what Chronic Mesenteric Ischemia is and what does she need to do about it. She said the day after going to the ER, She only did liquids. Today she started incorporating some steamed fish for lunch. Drinking fluids as much as possible. Has been able to hold down her food today.

## 2022-01-14 NOTE — Telephone Encounter (Signed)
Spoke to pt. She has an OV coming up 01-20-22.

## 2022-01-20 ENCOUNTER — Encounter: Payer: Self-pay | Admitting: Internal Medicine

## 2022-01-20 ENCOUNTER — Ambulatory Visit (INDEPENDENT_AMBULATORY_CARE_PROVIDER_SITE_OTHER): Payer: Medicare Other | Admitting: Internal Medicine

## 2022-01-20 VITALS — BP 134/70 | HR 84 | Temp 97.7°F | Ht 64.0 in | Wt 138.0 lb

## 2022-01-20 DIAGNOSIS — K559 Vascular disorder of intestine, unspecified: Secondary | ICD-10-CM

## 2022-01-20 DIAGNOSIS — R002 Palpitations: Secondary | ICD-10-CM | POA: Diagnosis not present

## 2022-01-20 DIAGNOSIS — I5032 Chronic diastolic (congestive) heart failure: Secondary | ICD-10-CM | POA: Diagnosis not present

## 2022-01-20 DIAGNOSIS — J449 Chronic obstructive pulmonary disease, unspecified: Secondary | ICD-10-CM | POA: Diagnosis not present

## 2022-01-20 NOTE — Assessment & Plan Note (Signed)
Zio monitor still on Echo ordered

## 2022-01-20 NOTE — Assessment & Plan Note (Signed)
Has not tolerated any food Discussed small quantities of ice cream/boost Very small amounts of solid food as things settle down She MUST stop smoking---urged her to do this

## 2022-01-20 NOTE — Progress Notes (Signed)
Subjective:    Patient ID: Tiffany Martinez, female    DOB: 08/28/1933, 88 y.o.   MRN: [id number]  HPI Here for follow up of ER visits and heart problems  Had episode of abdominal pain and vomiting Went to ER---reviewed evaluation Did improve--and able to eat Then had recurrence of abdominal pain and vomiting (the next day)  Has cut out eating and pain is better Doesn't like boost---so having beef broth, electrolytes No solids--and no recurrence of the pain  Runs from right to left and in waves when the pain comes on  Current Outpatient Medications on File Prior to Visit  Medication Sig Dispense Refill   amLODipine (NORVASC) 5 MG tablet Take 1 tablet (5 mg total) by mouth daily. 90 tablet 3   atorvastatin (LIPITOR) 20 MG tablet Take 1 tablet (20 mg total) by mouth at bedtime.     loratadine (CLARITIN) 10 MG tablet Take 10 mg by mouth daily.     losartan (COZAAR) 100 MG tablet Take 1 tablet (100 mg total) by mouth daily. 90 tablet 3   Multiple Vitamin (MULTIVITAMIN ADULT PO) Take 1 tablet by mouth daily.     ondansetron (ZOFRAN-ODT) 4 MG disintegrating tablet Take 1 tablet (4 mg total) by mouth every 8 (eight) hours as needed for nausea or vomiting. 20 tablet 0   potassium chloride SA (KLOR-CON M) 20 MEQ tablet Take 20 mEq by mouth daily.     furosemide (LASIX) 40 MG tablet Take 1 tablet (40 mg total) by mouth daily as needed. If weight is up or for fluid (Patient not taking: Reported on 01/20/2022) 30 tablet 1   No current facility-administered medications on file prior to visit.    Allergies  Allergen Reactions   Triamterene Hives    Past Medical History:  Diagnosis Date   AAA (abdominal aortic aneurysm) ([place]) 2012   3.9cm, rec rpt 1 yr   Anemia    Anxiety    CHF (congestive heart failure) ([facility])    Chronic venous insufficiency    CLL (chronic lymphocytic leukemia) ([place]) 2014   Stage 0--by flow cytometry   Hx of colonoscopy    Hyperlipidemia    Hypertension     Melanoma ([place])    Right Arm   Migraine headache    3-4X/yr   PAD (peripheral artery disease) ([place]) 2012   severe R common iliac stenosis, ABI 0.73, mid L common iliac stenosis ABI 1.1   PAD (peripheral artery disease) ([place])    Wears dentures    full upper and lower    Past Surgical History:  Procedure Laterality Date   ABDOMINAL AORTIC ANEURYSM REPAIR  02/16/12   [facility]--Dr Sammuel Hines   CARDIOVASCULAR STRESS TEST  2008   negative   CATARACT EXTRACTION W/PHACO Left 07/16/2019   Procedure: CATARACT EXTRACTION PHACO AND INTRAOCULAR LENS PLACEMENT (IOC) LEFT 8.26  00:46.6;  Surgeon: Eulogio Bear, MD;  Location: [facility];  Service: Ophthalmology;  Laterality: Left;   DOBUTAMINE STRESS ECHO  11/11   negative   HIP ARTHROPLASTY Right 10/10/2019   Procedure: ARTHROPLASTY BIPOLAR HIP (HEMIARTHROPLASTY);  Surgeon: Corky Mull, MD;  Location: [facility];  Service: Orthopedics;  Laterality: Right;   KNEE ARTHROSCOPY  1998   right   MELANOMA EXCISION  11/09/10   wide excision right upper arm   SQUAMOUS CELL CARCINOMA EXCISION  02/2016   TONSILLECTOMY AND ADENOIDECTOMY  1960    Family History  Problem Relation Age of Onset   Alzheimer's disease Mother  Pancreatic cancer Father    Parkinson's disease Brother    Coronary artery disease Neg Hx    Diabetes Neg Hx    Cancer Neg Hx        breast or colon    Social History   Socioeconomic History   Marital status: Widowed    Spouse name: Not on file   Number of children: 4   Years of education: Not on file   Highest education level: Not on file  Occupational History   Occupation: retired- Therapist, sports,   Tobacco Use   Smoking status: Former    Types: Cigarettes    Quit date: 03/17/2021    Years since quitting: 0.8   Smokeless tobacco: Never   Tobacco comments:    occassionally  Vaping Use   Vaping Use: Never used  Substance and Sexual Activity   Alcohol use: Yes    Alcohol/week: 7.0 standard drinks of alcohol    Types: 7  Glasses of wine per week    Comment: regular wine with dinner   Drug use: No   Sexual activity: Never  Other Topics Concern   Not on file  Social History Narrative   Widowed 2/11 -2nd for him , 1st for her. 4 step sons. 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         Has living will and health care POA--changing her POA at this point   Requests DNR   Discussed MOST form---but would probably accept IV fluids/antibiotics, etc   Requests no feeding tube         Social Determinants of Health   Financial Resource Strain: Not on file  Food Insecurity: Not on file  Transportation Needs: Not on file  Physical Activity: Not on file  Stress: Not on file  Social Connections: Not on file  Intimate Partner Violence: Not on file   Review of Systems Breathing has been okay No further syncope Feels weak No chest pain    Objective:   Physical Exam Constitutional:      Appearance: Normal appearance.  Cardiovascular:     Rate and Rhythm: Normal rate and regular rhythm.     Heart sounds: No murmur heard.    No gallop.  Pulmonary:     Effort: Pulmonary effort is normal.     Breath sounds: No wheezing or rales.     Comments: Decreased breath sounds Abdominal:     Palpations: Abdomen is soft.     Tenderness: There is no abdominal tenderness.  Musculoskeletal:     Cervical back: Neck supple.     Right lower leg: No edema.     Left lower leg: No edema.  Lymphadenopathy:     Cervical: No cervical adenopathy.  Neurological:     Mental Status: She is alert.  Psychiatric:        Mood and Affect: Mood normal.        Behavior: Behavior normal.            Assessment & Plan:

## 2022-01-20 NOTE — Assessment & Plan Note (Signed)
Urged her to stop smoking Chronic DOE, some cough No Rx

## 2022-01-20 NOTE — Assessment & Plan Note (Signed)
Seems euthyroid Furosemide only if she gains weight Losartan '100mg'$  daily

## 2022-01-20 NOTE — Patient Instructions (Signed)
ABSOLUTELY NO CIGARETTES!!!!

## 2022-02-01 DIAGNOSIS — R002 Palpitations: Secondary | ICD-10-CM | POA: Diagnosis not present

## 2022-02-01 DIAGNOSIS — R55 Syncope and collapse: Secondary | ICD-10-CM | POA: Diagnosis not present

## 2022-02-02 ENCOUNTER — Ambulatory Visit: Payer: Medicare Other | Admitting: Internal Medicine

## 2022-02-03 ENCOUNTER — Telehealth: Payer: Self-pay | Admitting: Student

## 2022-02-03 NOTE — Telephone Encounter (Signed)
Attempted to contact patient to offer to schedule a Palliative Consult, no answer and unable to leave a message due to no voicemail set up.  No other contact numbers listed in Epic for patient.

## 2022-02-04 ENCOUNTER — Ambulatory Visit: Payer: Medicare Other | Attending: Cardiology

## 2022-02-04 DIAGNOSIS — R0602 Shortness of breath: Secondary | ICD-10-CM | POA: Insufficient documentation

## 2022-02-04 LAB — ECHOCARDIOGRAM COMPLETE
AR max vel: 2.37 cm2
AV Area VTI: 2.47 cm2
AV Area mean vel: 2.38 cm2
AV Mean grad: 5 mmHg
AV Peak grad: 12.1 mmHg
AV Vena cont: 0.43 cm
Ao pk vel: 1.74 m/s
Area-P 1/2: 2.12 cm2
P 1/2 time: 615 msec
S' Lateral: 3.5 cm
Single Plane A2C EF: 58.7 %

## 2022-02-05 ENCOUNTER — Telehealth: Payer: Self-pay | Admitting: *Deleted

## 2022-02-05 ENCOUNTER — Ambulatory Visit: Payer: TRICARE For Life (TFL) | Admitting: Internal Medicine

## 2022-02-05 DIAGNOSIS — I771 Stricture of artery: Secondary | ICD-10-CM

## 2022-02-05 DIAGNOSIS — K551 Chronic vascular disorders of intestine: Secondary | ICD-10-CM

## 2022-02-05 NOTE — Telephone Encounter (Signed)
-----   Message from Gerrie Nordmann, NP sent at 02/05/2022  8:56 AM EDT ----- Heart function is unchanged from previous study at 60-65%,. Leakage is still present in one of the valves. There is still noted stiffness as before to the heart muscle.Continue to monitor weight and take current medications to reduce fluid build-up. 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

## 2022-02-05 NOTE — Progress Notes (Signed)
Heart function is unchanged from previous study at 60-65%,. Leakage is still present in one of the valves. There is still noted stiffness as before to the heart muscle.Continue to monitor weight and take current medications to reduce fluid build-up. 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

## 2022-02-05 NOTE — Telephone Encounter (Signed)
Spoke with patient and reviewed her results and recommendations. Patient has not been able to eat much and was told to hydrate. When she came for her echocardiogram she states the lady told her she had a hole in hear heart and options are surgery, sit it out, or live with it and stated she may need more in depth discussion with provider. She states that the lady was so nice and she was very good. Confirmed her upcoming appointment here in our office. She discussed personal things such as setting up a trust and other personal barriers but these were not related to current call. Provided reassurance and that we will try to assist with helping her understand her plan of care. She verbalized understanding with no further questions for now.

## 2022-02-08 ENCOUNTER — Encounter: Payer: Self-pay | Admitting: Internal Medicine

## 2022-02-08 ENCOUNTER — Ambulatory Visit (INDEPENDENT_AMBULATORY_CARE_PROVIDER_SITE_OTHER): Payer: Medicare Other | Admitting: Internal Medicine

## 2022-02-08 DIAGNOSIS — K559 Vascular disorder of intestine, unspecified: Secondary | ICD-10-CM

## 2022-02-08 DIAGNOSIS — I5032 Chronic diastolic (congestive) heart failure: Secondary | ICD-10-CM

## 2022-02-08 NOTE — Progress Notes (Signed)
Subjective:    Patient ID: Tiffany Martinez, female    DOB: 10-02-1933, 88 y.o.   MRN: [id number]  HPI Here for follow up of severe abdominal pain and other issues  Still not eating much Rice pudding for breakfast Still unsure of whether any food is going to cause her pain Toast and marmalade (her favorite) has caused pain Is eating ice cream Has done okay with linguini alfredo Tolerated salmon Now doing okay with slow sips of boost Now problems with glass of wine  Recent echo--it looks good  Has never been significant smoker--1 a day often She has stopped this  Hasn't needed the furosemide---weight holding No swelling No chest pain No SOB  Current Outpatient Medications on File Prior to Visit  Medication Sig Dispense Refill   amLODipine (NORVASC) 5 MG tablet Take 1 tablet (5 mg total) by mouth daily. 90 tablet 3   atorvastatin (LIPITOR) 20 MG tablet Take 1 tablet (20 mg total) by mouth at bedtime.     loratadine (CLARITIN) 10 MG tablet Take 10 mg by mouth daily.     losartan (COZAAR) 100 MG tablet Take 1 tablet (100 mg total) by mouth daily. 90 tablet 3   Multiple Vitamin (MULTIVITAMIN ADULT PO) Take 1 tablet by mouth daily.     ondansetron (ZOFRAN-ODT) 4 MG disintegrating tablet Take 1 tablet (4 mg total) by mouth every 8 (eight) hours as needed for nausea or vomiting. 20 tablet 0   potassium chloride SA (KLOR-CON M) 20 MEQ tablet Take 20 mEq by mouth daily.     furosemide (LASIX) 40 MG tablet Take 1 tablet (40 mg total) by mouth daily as needed. If weight is up or for fluid (Patient not taking: Reported on 02/08/2022) 30 tablet 1   No current facility-administered medications on file prior to visit.    Allergies  Allergen Reactions   Triamterene Hives    Past Medical History:  Diagnosis Date   AAA (abdominal aortic aneurysm) ([place]) 2012   3.9cm, rec rpt 1 yr   Anemia    Anxiety    CHF (congestive heart failure) ([facility])    Chronic venous insufficiency    CLL  (chronic lymphocytic leukemia) ([place]) 2014   Stage 0--by flow cytometry   Hx of colonoscopy    Hyperlipidemia    Hypertension    Melanoma ([place])    Right Arm   Migraine headache    3-4X/yr   PAD (peripheral artery disease) ([place]) 2012   severe R common iliac stenosis, ABI 0.73, mid L common iliac stenosis ABI 1.1   PAD (peripheral artery disease) ([place])    Wears dentures    full upper and lower    Past Surgical History:  Procedure Laterality Date   ABDOMINAL AORTIC ANEURYSM REPAIR  02/16/12   [facility]--Dr Sammuel Hines   CARDIOVASCULAR STRESS TEST  2008   negative   CATARACT EXTRACTION W/PHACO Left 07/16/2019   Procedure: CATARACT EXTRACTION PHACO AND INTRAOCULAR LENS PLACEMENT (IOC) LEFT 8.26  00:46.6;  Surgeon: Eulogio Bear, MD;  Location: [facility];  Service: Ophthalmology;  Laterality: Left;   DOBUTAMINE STRESS ECHO  11/11   negative   HIP ARTHROPLASTY Right 10/10/2019   Procedure: ARTHROPLASTY BIPOLAR HIP (HEMIARTHROPLASTY);  Surgeon: Corky Mull, MD;  Location: [facility];  Service: Orthopedics;  Laterality: Right;   KNEE ARTHROSCOPY  1998   right   MELANOMA EXCISION  11/09/10   wide excision right upper arm   SQUAMOUS CELL CARCINOMA EXCISION  02/2016  TONSILLECTOMY AND ADENOIDECTOMY  1960    Family History  Problem Relation Age of Onset   Alzheimer's disease Mother    Pancreatic cancer Father    Parkinson's disease Brother    Coronary artery disease Neg Hx    Diabetes Neg Hx    Cancer Neg Hx        breast or colon    Social History   Socioeconomic History   Marital status: Widowed    Spouse name: Not on file   Number of children: 4   Years of education: Not on file   Highest education level: Not on file  Occupational History   Occupation: retired- Therapist, sports,   Tobacco Use   Smoking status: Former    Types: Cigarettes    Quit date: 03/17/2021    Years since quitting: 0.8    Passive exposure: Never   Smokeless tobacco: Never   Tobacco comments:     occassionally  Vaping Use   Vaping Use: Never used  Substance and Sexual Activity   Alcohol use: Yes    Alcohol/week: 7.0 standard drinks of alcohol    Types: 7 Glasses of wine per week    Comment: regular wine with dinner   Drug use: No   Sexual activity: Never  Other Topics Concern   Not on file  Social History Narrative   Widowed 2/11 -2nd for him , 1st for her. 4 step sons. 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  Cardiovascular:     Rate and Rhythm: Normal rate and regular rhythm.     Heart sounds: No murmur heard.    No gallop.  Pulmonary:     Effort: Pulmonary effort is normal.     Breath sounds: No wheezing or rales.     Comments: Decreased breath sounds but clear Abdominal:     Palpations: Abdomen is soft.     Tenderness: There is no abdominal tenderness. There is no guarding or rebound.  Musculoskeletal:     Cervical back: Neck supple.     Right lower leg: No edema.     Left lower leg: No edema.  Lymphadenopathy:     Cervical: No cervical adenopathy.  Neurological:     Mental Status: She is alert.            Assessment & Plan:

## 2022-02-08 NOTE — Assessment & Plan Note (Signed)
Compensated Discussed maintaining preload for her intestinal perfusion---so only take the furosemide if she has PND or clear edema

## 2022-02-08 NOTE — Assessment & Plan Note (Signed)
Is better Has progressed mildly with eating Discussed that she needs to continue with small frequent meals--no limitations on what she eats (as tolerated) Weight is stable

## 2022-02-09 ENCOUNTER — Telehealth: Payer: Self-pay | Admitting: *Deleted

## 2022-02-09 NOTE — Telephone Encounter (Signed)
Reviewed results and recommendations with patient. She confirmed appointment for next week. We discussed all of this information in detail and she is really concerned with all of this. She prefers to wait until her upcoming appointment in order to discuss all of this with the provider. Reviewed what each medication is for and we discussed patient assistance if needed. She repeated all information back and has tons of questions which she will review with Barbera Setters next week. She was appreciative for the call with no further questions at this time.

## 2022-02-09 NOTE — Telephone Encounter (Signed)
-----   Message from Gerrie Nordmann, NP sent at 02/08/2022  6:01 PM EDT ----- Average heart rate was around 66 bpm.  She did have 1 episode of atrial fibrillation that lasted for 1 hour and 47 minutes and the average rate was around 70 bpm.  Unfortunately with the finding of atrial fibrillation on her monitor we will need to make some medication changes as well as discuss anticoagulant therapy.  She has had an high burden of PVCs and could benefit from beta-blocker therapy.  Would like to start her on Toprol-XL 25 mg daily and decrease her amlodipine 2.5 mg daily.  We also need to discuss anticoagulant to reduce the risk of stroke with her have an new onset paroxysmal atrial fibrillation.  She would qualify for Eliquis 2.5 mg twice daily with her age and kidney function.

## 2022-02-10 ENCOUNTER — Telehealth: Payer: Self-pay | Admitting: Cardiovascular Disease

## 2022-02-10 NOTE — Telephone Encounter (Signed)
Spoke with the patient who states that she talked with her pharmacist and they were not sure about Eliquis. There seems to be some confusion on what the pharmacist told the patient. Patient had multiple questions about her heart monitor results and Eliquis that I have answered for her. She also has concerns about GI issues that she has been having recently which I have advised her to reach back out to her PCP In regards to. She will discuss heart monitor results and anticoagulation at her appointment on Monday.

## 2022-02-10 NOTE — Telephone Encounter (Signed)
Pt c/o medication issue:  1. Name of Medication:   Eliquis  2. How are you currently taking this medication (dosage and times per day)?  Not taking  3. Are you having a reaction (difficulty breathing--STAT)? N/A  4. What is your medication issue?   Patient stated she has not got this medication as yet as her pharmacy ([facility], [place]) stated they did not know about the regulations for this medication. Patient would like a call back regarding getting this medication.

## 2022-02-14 NOTE — Progress Notes (Unsigned)
Cardiology Clinic Note   Patient Name: Tiffany Martinez Date of Encounter: 02/15/2022  Primary Care Provider:  Venia Carbon, MD Primary Cardiologist:  Ida Rogue, MD  Patient Profile    88 year old female with a past medical history of PAD, severe right common iliac stenosis, infrarenal AAA, right common iliac aneurysm, COPD, diastolic congestive heart failure, hypertensive heart disease, who follows up today after previous work-up for syncope with collapse.  Past Medical History    Past Medical History:  Diagnosis Date   AAA (abdominal aortic aneurysm) ([place]) 2012   3.9cm, rec rpt 1 yr   Anemia    Anxiety    CHF (congestive heart failure) (H[facility])    Chronic venous insufficiency    CLL (chronic lymphocytic leukemia) ([place]) 2014   Stage 0--by flow cytometry   Hx of colonoscopy    Hyperlipidemia    Hypertension    Melanoma ([place])    Right Arm   Migraine headache    3-4X/yr   PAD (peripheral artery disease) ([place]) 2012   severe R common iliac stenosis, ABI 0.73, mid L common iliac stenosis ABI 1.1   PAD (peripheral artery disease) ([place])    Wears dentures    full upper and lower   Past Surgical History:  Procedure Laterality Date   ABDOMINAL AORTIC ANEURYSM REPAIR  02/16/12   [facility]--Dr Sammuel Hines   CARDIOVASCULAR STRESS TEST  2008   negative   CATARACT EXTRACTION W/PHACO Left 07/16/2019   Procedure: CATARACT EXTRACTION PHACO AND INTRAOCULAR LENS PLACEMENT (IOC) LEFT 8.26  00:46.6;  Surgeon: Eulogio Bear, MD;  Location: [facility];  Service: Ophthalmology;  Laterality: Left;   DOBUTAMINE STRESS ECHO  11/11   negative   HIP ARTHROPLASTY Right 10/10/2019   Procedure: ARTHROPLASTY BIPOLAR HIP (HEMIARTHROPLASTY);  Surgeon: Corky Mull, MD;  Location: [facility];  Service: Orthopedics;  Laterality: Right;   KNEE ARTHROSCOPY  1998   right   MELANOMA EXCISION  11/09/10   wide excision right upper arm   SQUAMOUS CELL CARCINOMA EXCISION  02/2016   TONSILLECTOMY  AND ADENOIDECTOMY  1960    Allergies  Allergies  Allergen Reactions   Triamterene Hives    History of Present Illness    88 year old female with a past medical history of PAD, severe right common iliac stenosis, infrarenal AAA, right iliac aneurysm, COPD, diastolic congestive heart failure, and hypertensive heart disease, previous episode of syncope with collapse.  She was evaluated in the office in 2012 for lower extremity PAD.  Abdominal aortic duplex and ABIs showed severe right common iliac stenosis and an ABI of 0.73 on the right with no significant disease in the left with normal ABI on that side, there was notation of a 3.9 cm infrarenal AAA.  There was greater than 70% ostial right common iliac stenosis and a right common iliac aneurysm about 2 cm in diameter.  In June 2021 she presented to the emergency department via EMS from rehab center with complaint of a fall and a right ankle injury.  Was noted to have elevated systolic blood pressure of up to 200.  She had outpatient follow-up after the emergency department visit.  Last echocardiogram was completed 09/2017 with an LVEF of 60-65%, no wall motion abnormalities, G2 DD, and mild aortic regurgitation.  She was last seen in clinic 01/06/2022 after leaving the emergency department AMA.  She stated she waited 6 hours prior to any care and decided to leave after she had an episode of syncope  with collapse at home.  Since that time she had noticed she had worsening fatigue and shortness of breath over the last several months.  She had an echocardiogram that was completed that revealed LVEF 60-65%, no regional wall motion abnormalities, G1 DD, and mild to moderate aortic regurgitation with aortic valve sclerosis being present.  Was also incidental finding greater than 77% stenosis in the celiac and SMA with and suggestion of a vascular consult was made.  She also wore a long-term heart monitor which revealed sinus rhythm the average heart rate  of 66 bpm and she had atrial fibrillation that lasted for about an hour and 47 minutes with an average rate of 70 bpm which was less than 1% burden.  She had 13 supraventricular tachycardia runs that occurred with a maximum of 154 bpm with the longest lasting 15 beats, there were isolated SVE's and isolated VE's there was noted ventricular trigeminy.  Vascular surgery did evaluate CTA abdomen and pelvis at the request of the [facility] emergency department and stated she had obvious CA and SMA disease, CTA imaging was incomplete with persistent disease only MCA branches with obvious atherosclerotic disease distally, SMA has either STO versus CTO in proximal segment, IMA remains occluded by EVAR, femoral disease as documented.  Femoral findings noted today were presented previously and they doubted the patient was a candidate for open mesenteric bypass given her age and comorbidities.  She returns to clinic today with questions and following up on previous testing she had completed. She states that she is doing faily well. She does continue to have palpitations and endorses fatigue. She has come today prepared with several questions about her recent testing and medications recommendations. She denies any recent syncopal episodes, dizziness or lightheadedness. She also denies any recent hospitalizations or visits to the emergency department.  Home Medications    Current Outpatient Medications  Medication Sig Dispense Refill   amLODipine (NORVASC) 5 MG tablet Take 1 tablet (5 mg total) by mouth daily. 90 tablet 3   apixaban (ELIQUIS) 2.5 MG TABS tablet Take 1 tablet (2.5 mg total) by mouth 2 (two) times daily. 60 tablet 3   atorvastatin (LIPITOR) 20 MG tablet Take 1 tablet (20 mg total) by mouth at bedtime.     loratadine (CLARITIN) 10 MG tablet Take 10 mg by mouth daily.     losartan (COZAAR) 100 MG tablet Take 1 tablet (100 mg total) by mouth daily. 90 tablet 3   metoprolol succinate (TOPROL XL) 25 MG 24 hr  tablet Take 0.5 tablets (12.5 mg total) by mouth daily. 45 tablet 3   Multiple Vitamin (MULTIVITAMIN ADULT PO) Take 1 tablet by mouth daily.     ondansetron (ZOFRAN-ODT) 4 MG disintegrating tablet Take 1 tablet (4 mg total) by mouth every 8 (eight) hours as needed for nausea or vomiting. 20 tablet 0   potassium chloride SA (KLOR-CON M) 20 MEQ tablet Take 20 mEq by mouth daily.     furosemide (LASIX) 40 MG tablet Take 1 tablet (40 mg total) by mouth daily as needed. If weight is up or for fluid (Patient not taking: Reported on 02/08/2022) 30 tablet 1   No current facility-administered medications for this visit.     Family History    Family History  Problem Relation Age of Onset   Alzheimer's disease Mother    Pancreatic cancer Father    Parkinson's disease Brother    Coronary artery disease Neg Hx    Diabetes Neg Hx  Cancer Neg Hx        breast or colon   She indicated that her mother is deceased. She indicated that her father is deceased. She indicated that both of her brothers are alive. She indicated that the status of her neg hx is unknown.  Social History    Social History   Socioeconomic History   Marital status: Widowed    Spouse name: Not on file   Number of children: 4   Years of education: Not on file   Highest education level: Not on file  Occupational History   Occupation: retired- Therapist, sports,   Tobacco Use   Smoking status: Former    Types: Cigarettes    Quit date: 03/17/2021    Years since quitting: 0.9    Passive exposure: Never   Smokeless tobacco: Never   Tobacco comments:    occassionally  Vaping Use   Vaping Use: Never used  Substance and Sexual Activity   Alcohol use: Yes    Alcohol/week: 7.0 standard drinks of alcohol    Types: 7 Glasses of wine per week    Comment: regular wine with dinner   Drug use: No   Sexual activity: Never  Other Topics Concern   Not on file  Social History Narrative   Widowed 2/11 -2nd for him , 1st for her. 4 step sons.  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 Endorses fatigue Cardiovascular:  No chest pain, dyspnea on exertion, endorses occasional peripheral edema edema, orthopnea, endorses palpitations, and denies paroxysmal nocturnal dyspnea. Dermatological: No rash, lesions/masses Respiratory: No cough, dyspnea Urologic: No hematuria, dysuria Abdominal:   No nausea, vomiting, diarrhea, bright red blood per rectum, melena, or hematemesis Neurologic:  No visual changes, wkns, changes in mental status. All other systems reviewed and are otherwise negative except as noted above.   Physical Exam    VS:  BP (!) 140/60 (BP Location: Left Arm, Patient Position: Sitting, Cuff Size: Normal)   Pulse 73   Ht 5' 4.5" (1.638 m)   Wt 139 lb 12.8 oz (63.4 kg)   LMP  (LMP Unknown)   SpO2 96%   BMI 23.63 kg/m  , BMI Body mass index is 23.63 kg/m.     GEN: Well nourished, well developed, in no acute distress. Walking with Rolator HEENT: normal.  Glasses on Neck: Supple, no JVD, carotid bruits, or masses. Cardiac: RRR, no murmurs, rubs, or gallops. No clubbing, cyanosis, edema.  Radials/DP/PT 2+ and equal bilaterally.  Respiratory:  Respirations regular and unlabored, clear to auscultation bilaterally. GI: Soft, nontender, nondistended, BS + x 4. MS: no deformity or atrophy. Skin: warm and dry, no rash. Neuro:  Strength  and sensation are intact. Psych: Normal affect.  Accessory Clinical Findings    ECG personally reviewed  by me today-sinus rhythm with occasional PVCs with a rate of 73 LVH with early repolarization and left axis deviation- No acute changes  Lab Results  Component Value Date   WBC 26.4 (H) 01/10/2022   HGB 11.5 (L) 01/10/2022   HCT 33.7 (L) 01/10/2022   MCV 106.0 (H) 01/10/2022   PLT 155 01/10/2022   Lab Results  Component Value Date   CREATININE 1.47 (H) 01/10/2022   BUN 36 (H) 01/10/2022   NA 139 01/10/2022   K 3.8 01/10/2022   CL 108 01/10/2022   CO2 22 01/10/2022   Lab Results  Component Value Date   ALT 13 01/10/2022   AST 29 01/10/2022   GGT 26 03/30/2012   ALKPHOS 65 01/10/2022   BILITOT 0.8 01/10/2022   Lab Results  Component Value Date   CHOL 164 02/21/2020   HDL 34.40 (L) 02/21/2020   LDLCALC 91 02/21/2020   LDLDIRECT 89.7 01/15/2014   TRIG 192.0 (H) 02/21/2020   CHOLHDL 5 02/21/2020    Lab Results  Component Value Date   HGBA1C 4.9 04/03/2021    Assessment & Plan   1.  Syncope with collapse as patient has not had any recurrent episodes since her last visit she did wear a Holter monitor which revealed 1 hour and 47 minutes of atrial fibrillation with an average rate of 70 bpm and, 13 supraventricular tachycardia runs the highest lasting 5 beats with a maximum heart rate of 115 bpm, she also had ventricular trigeminy that was present. There were no pauses noted. Patient has not driven for several years and continues to reside at [facility] so transportation is provided.   2.New onset paroxysmal atrial fibrillation that is rate controlled on the monitor.  With her continued complaints of palpitations should be started on Toprol-XL 12.5 mg daily this will also help set her blood pressure is elevated systolic 935.  She is also been started on Eliquis 2.5 mg twice daily for CHA2DS2-VASc score of at least 5. Lengthy discussion of the risk of stroke related to the  paroxysmal atrial fibrillation as well as medication side effects. She is being scheduled for a CBC and BMP afte being started on new medications to assess hgb and serum creatinine.  3.  Chronic diastolic heart failure with some occasional shortness of breath and worsening fatigue.  She continues to remain euvolemic on exam with a recent echocardiogram completed that showed LVEF 60-65%, no regional wall motion abnormalities, G1 DD, mild calcification of the aortic valve with mild to moderate aortic regurgitation.  She is continued on losartan daily and furosemide as needed.  4.  Essential hypertension with blood pressure today 140/68, which is stable for today.  She continues on amlodipine 5 mg daily furosemide 40 mg as needed losartan 100 mg daily and being started on Toprol-XL 12.5 mg daily.  She is continue to monitor her pressure at the facility.  We did discuss if her blood pressure is lower at that time that we can cut back on her Norvasc to allow for up titration of the Toprol-XL if needed.  5.  Peripheral arterial disease with abdominal aortic aneurysm with severe right common iliac stenosis, right internal iliac artery remains occluded at its origin but reconstitutes distally unchanged from CT.  Latest CT completed 01/10/2022 revealed obvious CAM SMA disease with CA imaging incomplete but persistent disease into the CA branches without obvious atherosclerotic disease distally, SMA is either STO versus CTO in proximal segment, IMA remains occluded by EVAR.  Patient states this continues to be followed by her internal medicine doctor for greater than 15 years.  She did have a chart review done by Irving Copas from vascular surgery after recent CT scan that was done in the emergency department who stated the patient is are not likely a candidate for any type of open procedure at this time her age and comorbidities.  6.  COPD with long history of smoking chronic cough likely with chronic bronchitis who  presents today with improved shortness of breath.  Echocardiogram reveals LVEF 60-65%.  This continues to be followed by her PCP as today her symptoms have improved.  7.  Disposition patient will  return to clinic in 4 weeks or sooner if needed to see MD/APP to re-evaluate symptoms and to ensure she is tolerating medication changes.  Mehek Grega, NP 02/15/2022, 12:18 PM

## 2022-02-15 ENCOUNTER — Ambulatory Visit: Payer: Medicare Other | Attending: Cardiology | Admitting: Cardiology

## 2022-02-15 ENCOUNTER — Encounter: Payer: Self-pay | Admitting: Cardiology

## 2022-02-15 VITALS — BP 140/60 | HR 73 | Ht 64.5 in | Wt 139.8 lb

## 2022-02-15 DIAGNOSIS — I739 Peripheral vascular disease, unspecified: Secondary | ICD-10-CM | POA: Insufficient documentation

## 2022-02-15 DIAGNOSIS — R55 Syncope and collapse: Secondary | ICD-10-CM | POA: Diagnosis not present

## 2022-02-15 DIAGNOSIS — I480 Paroxysmal atrial fibrillation: Secondary | ICD-10-CM | POA: Diagnosis not present

## 2022-02-15 DIAGNOSIS — K559 Vascular disorder of intestine, unspecified: Secondary | ICD-10-CM | POA: Insufficient documentation

## 2022-02-15 DIAGNOSIS — I771 Stricture of artery: Secondary | ICD-10-CM | POA: Insufficient documentation

## 2022-02-15 DIAGNOSIS — I10 Essential (primary) hypertension: Secondary | ICD-10-CM | POA: Insufficient documentation

## 2022-02-15 DIAGNOSIS — I5032 Chronic diastolic (congestive) heart failure: Secondary | ICD-10-CM | POA: Insufficient documentation

## 2022-02-15 MED ORDER — APIXABAN 2.5 MG PO TABS: 2.5000 mg | ORAL_TABLET | Freq: Two times a day (BID) | ORAL | 3 refills | Status: DC

## 2022-02-15 MED ORDER — METOPROLOL SUCCINATE ER 25 MG PO TB24: 12.5000 mg | ORAL_TABLET | Freq: Every day | ORAL | 3 refills | Status: DC

## 2022-02-15 NOTE — Patient Instructions (Signed)
Medication Instructions:   START Eliquis 2.'5mg'$  - Take one tablet by mouth twice a day.  START Metoprolol Succinate - take half tablet (12.'5mg'$ ) by mouth daily.   *If you need a refill on your cardiac medications before your next appointment, please call your pharmacy*   Lab Work:  Your physician recommends that you return for lab work in: 3 weeks at the medical mall.  No appt is needed. Hours are M-F 7AM- 6 PM.  If you have labs (blood work) drawn today and your tests are completely normal, you will receive your results only by: [place] (if you have MyChart) OR A paper copy in the mail If you have any lab test that is abnormal or we need to change your treatment, we will call you to review the results.   Testing/Procedures:  None Ordered   Follow-Up: At [facility], you and your health needs are our priority.  As part of our continuing mission to provide you with exceptional heart care, we have created designated Provider Care Teams.  These Care Teams include your primary Cardiologist (physician) and Advanced Practice Providers (APPs -  Physician Assistants and Nurse Practitioners) who all work together to provide you with the care you need, when you need it.  We recommend signing up for the patient portal called "MyChart".  Sign up information is provided on this After Visit Summary.  MyChart is used to connect with patients for Virtual Visits (Telemedicine).  Patients are able to view lab/test results, encounter notes, upcoming appointments, etc.  Non-urgent messages can be sent to your provider as well.   To learn more about what you can do with MyChart, go to [email or web address].    Your next appointment:   4 week(s)  The format for your next appointment:   In Person  Provider:   You may see Ida Rogue, MD or one of the following Advanced Practice Providers on your designated Care Team:   Murray Hodgkins, NP Christell Faith, PA-C Cadence Kathlen Mody,  PA-C Gerrie Nordmann, NP    Important Information About Sugar

## 2022-02-16 ENCOUNTER — Telehealth: Payer: Self-pay | Admitting: Cardiology

## 2022-02-16 MED ORDER — APIXABAN 2.5 MG PO TABS: 2.5000 mg | ORAL_TABLET | Freq: Two times a day (BID) | ORAL | 3 refills | Status: DC

## 2022-02-16 NOTE — Telephone Encounter (Signed)
Spoke with patient and reviewed her patient assistance application. She mentioned that she has not been paying for her prescriptions. Advised that if she is not paying for them then there is no need to complete the application. Provided her with the numbers to her pharmacies to call and see what her cost has been and then we will know if we need to proceed with the application. She verbalized understanding of our conversation and will call me back later this week to review if we do not need to send in that application. No further questions at this time.

## 2022-02-17 ENCOUNTER — Telehealth: Payer: Self-pay | Admitting: Cardiology

## 2022-02-17 NOTE — Telephone Encounter (Signed)
Pt c/o medication issue:  1. Name of Medication: apixaban (ELIQUIS) 2.5 MG TABS tablet  2. How are you currently taking this medication (dosage and times per day)? N/A  3. Are you having a reaction (difficulty breathing--STAT)? N/A  4. What is your medication issue? Jeani Hawking is calling from [facility] stating the patient's out of pocket cost for a 30 day supply of eliquis is $38. She reports the patient advised our office was wanting to know. Please advise.

## 2022-02-18 ENCOUNTER — Ambulatory Visit: Payer: TRICARE For Life (TFL) | Admitting: Cardiology

## 2022-02-19 ENCOUNTER — Telehealth: Payer: Self-pay | Admitting: Cardiovascular Disease

## 2022-02-19 NOTE — Telephone Encounter (Signed)
Pt states that she is wanting to speak to Tiffany Martinez in regards to a referral to see her from Gerrie Nordmann, NP. Requesting return call.

## 2022-02-22 NOTE — Telephone Encounter (Signed)
Spoke w/ pt.   She had several questions regarding her upcoming appts and meds. She lives @ [facility] and uses their transportation, so would like to know how to go about getting her labs drawn and what to tell them when she arrives. Advised her to go to the [facility] and tell them she is there for labs and they will tell her where to go.   She states that sounds too easy and requests the names of the labs she will be getting.  Advised her that [place] ordered CBC and BMET. She asks how much she can eat, as she had n/v and was prescribed Zofran; she has been eating only liquids - Boost, ice cream and some rice pudding and reports that she is starving.   Advised her to slowly increase her solid foods w/ bland diet. She reports that her n/v has subsided, but returns if she eats anything heavy.  She feels that she is losing wt and feels weak. She will try to incorporate soup, crackers and toast and see how she feels.  She is appreciative and will call back w/ any further questions or concerns.

## 2022-02-24 ENCOUNTER — Other Ambulatory Visit
Admission: RE | Admit: 2022-02-24 | Discharge: 2022-02-24 | Disposition: A | Discharge status: Home / Self Care | Payer: Medicare Other | Attending: Cardiology | Admitting: Cardiology

## 2022-02-24 DIAGNOSIS — I5032 Chronic diastolic (congestive) heart failure: Secondary | ICD-10-CM | POA: Insufficient documentation

## 2022-02-24 LAB — CBC WITH DIFFERENTIAL/PLATELET
Abs Immature Granulocytes: 0.07 10*3/uL (ref 0.00–0.07)
Basophils Absolute: 0.1 10*3/uL (ref 0.0–0.1)
Basophils Relative: 0 %
Eosinophils Absolute: 0.1 10*3/uL (ref 0.0–0.5)
Eosinophils Relative: 0 %
HCT: 31.9 % — ABNORMAL LOW (ref 36.0–46.0)
Hemoglobin: 10.4 g/dL — ABNORMAL LOW (ref 12.0–15.0)
Immature Granulocytes: 0 %
Lymphocytes Relative: 87 %
Lymphs Abs: 22 10*3/uL — ABNORMAL HIGH (ref 0.7–4.0)
MCH: 34.2 pg — ABNORMAL HIGH (ref 26.0–34.0)
MCHC: 32.6 g/dL (ref 30.0–36.0)
MCV: 104.9 fL — ABNORMAL HIGH (ref 80.0–100.0)
Monocytes Absolute: 0.5 10*3/uL (ref 0.1–1.0)
Monocytes Relative: 2 %
Neutro Abs: 2.9 10*3/uL (ref 1.7–7.7)
Neutrophils Relative %: 11 %
Platelets: 154 10*3/uL (ref 150–400)
RBC: 3.04 MIL/uL — ABNORMAL LOW (ref 3.87–5.11)
RDW: 14.9 % (ref 11.5–15.5)
Smear Review: NORMAL
WBC Morphology: ABNORMAL
WBC: 25.7 10*3/uL — ABNORMAL HIGH (ref 4.0–10.5)
nRBC: 0.1 % (ref 0.0–0.2)

## 2022-02-24 LAB — BASIC METABOLIC PANEL
Anion gap: 5 (ref 5–15)
BUN: 36 mg/dL — ABNORMAL HIGH (ref 8–23)
CO2: 25 mmol/L (ref 22–32)
Calcium: 9.5 mg/dL (ref 8.9–10.3)
Chloride: 107 mmol/L (ref 98–111)
Creatinine, Ser: 1.73 mg/dL — ABNORMAL HIGH (ref 0.44–1.00)
GFR, Estimated: 29 mL/min — ABNORMAL LOW (ref >60)
Glucose, Bld: 112 mg/dL — ABNORMAL HIGH (ref 70–99)
Potassium: 4.2 mmol/L (ref 3.5–5.1)
Sodium: 137 mmol/L (ref 135–145)

## 2022-02-24 NOTE — Progress Notes (Signed)
Please send labs to PCP as well. Continued to have elevated white count. Kidney function is slightly elevated, need to increase water intake to stay hydrated, recheck bmp in 2 weeks prior to return

## 2022-02-26 ENCOUNTER — Telehealth: Payer: Self-pay | Admitting: Cardiovascular Disease

## 2022-02-26 ENCOUNTER — Other Ambulatory Visit: Payer: Self-pay | Admitting: *Deleted

## 2022-02-26 DIAGNOSIS — R55 Syncope and collapse: Secondary | ICD-10-CM

## 2022-02-26 DIAGNOSIS — I5032 Chronic diastolic (congestive) heart failure: Secondary | ICD-10-CM

## 2022-02-26 NOTE — Telephone Encounter (Signed)
She is having some finance questions. She wants to know about her conditions to determine her financial concerns. She continues to discuss her throwing up with eating. Advised to contact her PCP about the throwing up and her dietary concerns. She is now wanting to know if she can fly to [place]. She would like to see what Sheri's recommendations would be on this matter. She then stated her PCP advised she should not fly. Reviewed that if that was his recommendations then I am sure Barbera Setters would agree. She continues to discuss her problems with eating and throwing up after she eats.  She wants to know if there is something out there she could eat and digest. Again deferred to her primary care provider.   She states she has some freebie of Eliquis and she wants to return them. Advised that we can not take those back. She then wanted to know what she should do with them. I advised to take them.   She then stated she has a company meeting in [place] which requires her to make some decisions.   She then started talking about losing weight and not getting any nutrition.   She then mentioned she would cancel her flight and have them reschedule the board meeting.   She then states she feels very weak from not being able to eat. Again deferred to her PCP for assessment of her concerns. She states she has intestinal issues and just needs to know about that.   Our conversation was very fragmented and she went back and forth about various issues. Most of which were about her dietary problems.   Reiterated that she should contact PCP and answered her questions. She was appreciative for the time with no further questions. Call was very extensive and lengthy.

## 2022-02-26 NOTE — Telephone Encounter (Signed)
Pt is requesting a call back in regards to her last appt she states. She did not want to give further details, but stated it was rather urgent.

## 2022-03-04 ENCOUNTER — Other Ambulatory Visit
Admission: RE | Admit: 2022-03-04 | Discharge: 2022-03-04 | Disposition: A | Discharge status: Home / Self Care | Payer: Medicare Other | Attending: Cardiology | Admitting: Cardiology

## 2022-03-04 DIAGNOSIS — R55 Syncope and collapse: Secondary | ICD-10-CM | POA: Diagnosis not present

## 2022-03-04 DIAGNOSIS — I5032 Chronic diastolic (congestive) heart failure: Secondary | ICD-10-CM | POA: Diagnosis not present

## 2022-03-04 LAB — BASIC METABOLIC PANEL
Anion gap: 6 (ref 5–15)
BUN: 25 mg/dL — ABNORMAL HIGH (ref 8–23)
CO2: 26 mmol/L (ref 22–32)
Calcium: 9.8 mg/dL (ref 8.9–10.3)
Chloride: 105 mmol/L (ref 98–111)
Creatinine, Ser: 1.66 mg/dL — ABNORMAL HIGH (ref 0.44–1.00)
GFR, Estimated: 30 mL/min — ABNORMAL LOW (ref >60)
Glucose, Bld: 103 mg/dL — ABNORMAL HIGH (ref 70–99)
Potassium: 3.5 mmol/L (ref 3.5–5.1)
Sodium: 137 mmol/L (ref 135–145)

## 2022-03-05 ENCOUNTER — Telehealth: Payer: Self-pay | Admitting: Internal Medicine

## 2022-03-05 NOTE — Telephone Encounter (Signed)
There is already a message about this.

## 2022-03-05 NOTE — Telephone Encounter (Signed)
Pt called in requesting a call back would like to discuss problems eating . [phone]

## 2022-03-05 NOTE — Telephone Encounter (Signed)
Patient would like a phone call to discuss eating disorder.

## 2022-03-05 NOTE — Telephone Encounter (Signed)
Tried to call pt . No answer.  

## 2022-03-05 NOTE — Telephone Encounter (Signed)
Pt has an appt here 10-25. Liquid diet last 2-3 weeks. Cannot keep anything down. Feels so hungry. If she eats too much it comes back up in the next few hours. She asked to move the 10-25 appt up. I moved her to 10-23.

## 2022-03-08 ENCOUNTER — Ambulatory Visit: Payer: Medicare Other | Admitting: Internal Medicine

## 2022-03-10 ENCOUNTER — Encounter: Payer: Self-pay | Admitting: Internal Medicine

## 2022-03-10 ENCOUNTER — Ambulatory Visit: Payer: Medicare Other | Admitting: Internal Medicine

## 2022-03-10 ENCOUNTER — Ambulatory Visit (INDEPENDENT_AMBULATORY_CARE_PROVIDER_SITE_OTHER): Payer: Medicare Other | Admitting: Internal Medicine

## 2022-03-10 DIAGNOSIS — K559 Vascular disorder of intestine, unspecified: Secondary | ICD-10-CM | POA: Diagnosis not present

## 2022-03-10 DIAGNOSIS — I5032 Chronic diastolic (congestive) heart failure: Secondary | ICD-10-CM

## 2022-03-10 NOTE — Assessment & Plan Note (Signed)
Compensated on current regimne--metoprolol 12.5, losartan 100

## 2022-03-10 NOTE — Assessment & Plan Note (Signed)
Still gets pain at times---but overall better Tolerating some food--discussed small quantities and experiment with other foods Can try the boost with added ice cream May improve over time

## 2022-03-10 NOTE — Progress Notes (Signed)
Subjective:    Patient ID: Tiffany Martinez, female    DOB: Nov 20, 1933, 88 y.o.   MRN: [id number]  HPI Here for follow up of ongoing intestinal issues  Still not doing great Would love to have a steak--but doesn't tolerate it Has frozen fettucini meal that she tolerates and really likes Does okay with milk and ice cream Boost causes vomiting--but not the pain  Will have pain 3 hours after eating if she overdoes it Weight is stable Pain will resolve with vomiting--she will sometimes induce this if pain is bad  Will have occasional light headedness--if she doesn't eat for a while  She feels better on the eliquis BP has been lower No chest pain Breathing is okay  Current Outpatient Medications on File Prior to Visit  Medication Sig Dispense Refill   amLODipine (NORVASC) 5 MG tablet Take 1 tablet (5 mg total) by mouth daily. 90 tablet 3   apixaban (ELIQUIS) 2.5 MG TABS tablet Take 1 tablet (2.5 mg total) by mouth 2 (two) times daily. 180 tablet 3   atorvastatin (LIPITOR) 20 MG tablet Take 1 tablet (20 mg total) by mouth at bedtime.     furosemide (LASIX) 40 MG tablet Take 1 tablet (40 mg total) by mouth daily as needed. If weight is up or for fluid 30 tablet 1   loratadine (CLARITIN) 10 MG tablet Take 10 mg by mouth daily.     losartan (COZAAR) 100 MG tablet Take 1 tablet (100 mg total) by mouth daily. 90 tablet 3   metoprolol succinate (TOPROL XL) 25 MG 24 hr tablet Take 0.5 tablets (12.5 mg total) by mouth daily. 45 tablet 3   Multiple Vitamin (MULTIVITAMIN ADULT PO) Take 1 tablet by mouth daily.     ondansetron (ZOFRAN-ODT) 4 MG disintegrating tablet Take 1 tablet (4 mg total) by mouth every 8 (eight) hours as needed for nausea or vomiting. 20 tablet 0   potassium chloride SA (KLOR-CON M) 20 MEQ tablet Take 20 mEq by mouth daily.     No current facility-administered medications on file prior to visit.    Allergies  Allergen Reactions   Triamterene Hives    Past Medical  History:  Diagnosis Date   AAA (abdominal aortic aneurysm) ([place]) 2012   3.9cm, rec rpt 1 yr   Anemia    Anxiety    CHF (congestive heart failure) (HCC)    Chronic venous insufficiency    CLL (chronic lymphocytic leukemia) ([place]) 2014   Stage 0--by flow cytometry   Hx of colonoscopy    Hyperlipidemia    Hypertension    Melanoma ([place])    Right Arm   Migraine headache    3-4X/yr   PAD (peripheral artery disease) ([place]) 2012   severe R common iliac stenosis, ABI 0.73, mid L common iliac stenosis ABI 1.1   PAD (peripheral artery disease) ([place])    Wears dentures    full upper and lower    Past Surgical History:  Procedure Laterality Date   ABDOMINAL AORTIC ANEURYSM REPAIR  02/16/12   [facility]--Dr Sammuel Hines   CARDIOVASCULAR STRESS TEST  2008   negative   CATARACT EXTRACTION W/PHACO Left 07/16/2019   Procedure: CATARACT EXTRACTION PHACO AND INTRAOCULAR LENS PLACEMENT (IOC) LEFT 8.26  00:46.6;  Surgeon: Eulogio Bear, MD;  Location: [facility];  Service: Ophthalmology;  Laterality: Left;   DOBUTAMINE STRESS ECHO  11/11   negative   HIP ARTHROPLASTY Right 10/10/2019   Procedure: ARTHROPLASTY BIPOLAR HIP (HEMIARTHROPLASTY);  Surgeon:  Poggi, Marshall Cork, MD;  Location: [facility];  Service: Orthopedics;  Laterality: Right;   KNEE ARTHROSCOPY  1998   right   MELANOMA EXCISION  11/09/10   wide excision right upper arm   SQUAMOUS CELL CARCINOMA EXCISION  02/2016   TONSILLECTOMY AND ADENOIDECTOMY  1960    Family History  Problem Relation Age of Onset   Alzheimer's disease Mother    Pancreatic cancer Father    Parkinson's disease Brother    Coronary artery disease Neg Hx    Diabetes Neg Hx    Cancer Neg Hx        breast or colon    Social History   Socioeconomic History   Marital status: Widowed    Spouse name: Not on file   Number of children: 4   Years of education: Not on file   Highest education level: Not on file  Occupational History   Occupation: retired- Therapist, sports,   Tobacco  Use   Smoking status: Former    Types: Cigarettes    Quit date: 03/17/2021    Years since quitting: 0.9    Passive exposure: Never   Smokeless tobacco: Never   Tobacco comments:    occassionally  Vaping Use   Vaping Use: Never used  Substance and Sexual Activity   Alcohol use: Yes    Alcohol/week: 7.0 standard drinks of alcohol    Types: 7 Glasses of wine per week    Comment: regular wine with dinner   Drug use: No   Sexual activity: Never  Other Topics Concern   Not on file  Social History Narrative   Widowed 2/11 -2nd for him , 1st for her. 4 step sons. 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 Gets up 8AM---discussed not going to sleep till midnight     Objective:   Physical Exam Constitutional:      Appearance: Normal appearance.  Abdominal:     Palpations: Abdomen is soft.     Tenderness: There is no abdominal tenderness.  Neurological:     Mental Status: She is alert.            Assessment & Plan:

## 2022-03-16 NOTE — Progress Notes (Unsigned)
Cardiology Clinic Note   Patient Name: Natali Lavallee Date of Encounter: 03/17/2022  Primary Care Provider:  Venia Carbon, MD Primary Cardiologist:  Ida Rogue, MD  Patient Profile    88 year old female with a past medical history of PAD, severe right common iliac stenosis, infrarenal AAA, right common iliac aneurysm, COPD, diastolic congestive heart failure, hypertensive heart disease, follows up today after recent onset of atrial fibrillation.  Past Medical History    Past Medical History:  Diagnosis Date   AAA (abdominal aortic aneurysm) ([place]) 2012   3.9cm, rec rpt 1 yr   Anemia    Anxiety    CHF (congestive heart failure) (HCC)    Chronic venous insufficiency    CLL (chronic lymphocytic leukemia) ([place]) 2014   Stage 0--by flow cytometry   Hx of colonoscopy    Hyperlipidemia    Hypertension    Melanoma ([place])    Right Arm   Migraine headache    3-4X/yr   PAD (peripheral artery disease) ([place]) 2012   severe R common iliac stenosis, ABI 0.73, mid L common iliac stenosis ABI 1.1   PAD (peripheral artery disease) ([place])    Wears dentures    full upper and lower   Past Surgical History:  Procedure Laterality Date   ABDOMINAL AORTIC ANEURYSM REPAIR  02/16/12   [facility]--Dr Sammuel Hines   CARDIOVASCULAR STRESS TEST  2008   negative   CATARACT EXTRACTION W/PHACO Left 07/16/2019   Procedure: CATARACT EXTRACTION PHACO AND INTRAOCULAR LENS PLACEMENT (IOC) LEFT 8.26  00:46.6;  Surgeon: Eulogio Bear, MD;  Location: [facility];  Service: Ophthalmology;  Laterality: Left;   DOBUTAMINE STRESS ECHO  11/11   negative   HIP ARTHROPLASTY Right 10/10/2019   Procedure: ARTHROPLASTY BIPOLAR HIP (HEMIARTHROPLASTY);  Surgeon: Corky Mull, MD;  Location: [facility];  Service: Orthopedics;  Laterality: Right;   KNEE ARTHROSCOPY  1998   right   MELANOMA EXCISION  11/09/10   wide excision right upper arm   SQUAMOUS CELL CARCINOMA EXCISION  02/2016   TONSILLECTOMY AND  ADENOIDECTOMY  1960    Allergies  Allergies  Allergen Reactions   Triamterene Hives    History of Present Illness    88 year old female with past medical history of PAD, severe right common iliac stenosis, infrarenal AAA, right iliac aneurysm, COPD, diastolic congestive heart failure, hypertensive heart disease, previous episode of syncope with collapse, and new diagnoses of paroxysmal atrial fibrillation started on chronic anticoagulation of apixaban.  Most recent echocardiogram revealed LVEF of 60 to 65%, no regional wall motion abnormalities, G1 DD, and mild to moderate aortic regurgitation with aortic valve sclerosis being present.    She also wore a long-term heart monitor which revealed sinus rhythm with average rate of 66 bpm and she had atrial fibrillation that lasted for about an hour and 47 minutes with an average rate of 70 beats per minutes which was less than 1% burden.  She had 13 supraventricular tachycardia runs that occurred with a maximum 154 bpm with the longest lasting 15 beats there were also isolated SVE's and isolated VE's there was noted ventricular trigeminy as well.  There was also an incidental finding of greater than 77% stenosis in the celiac and SMA with suggestion of a vascular consult.  She was evaluated by vascular in the emergency department.  Vascular surgery did look at the CT of the abdomen pelvis at the request of the emergency department provider and stated she had obvious CA and  SMA disease.  CTA imaging was incomplete with persistent disease only MCA branches with obvious atherosclerotic disease distally.  SMA was either STO versus CTO in the proximal segment.  IMA remains occluded by EVAR femoral disease as documented.  Femoral findings noted today were presented previously and they did not consider the patient to be a good candidate for open mesenteric bypass given her age and comorbidities.  She returns to clinic today stating that she has been better.   She states that her primary care provider has diagnosed her with mesenteric ischemia she has been having the large quantity of GI complaints.  Her weight has remained stable even though she says she has continuous nausea with vomiting.  That she has found several foods that prevent the nausea and vomiting and and has been encouraged to continue to incorporate newer foods and liquids into her diet.  She also continues to endorse fatigue with taking afternoon naps.  She denies any chest pain or shortness of breath or claudication.  She also denies any recent hospitalizations or visits to the emergency department. Home Medications    Current Outpatient Medications  Medication Sig Dispense Refill   amLODipine (NORVASC) 5 MG tablet Take 1 tablet (5 mg total) by mouth daily. 90 tablet 3   apixaban (ELIQUIS) 2.5 MG TABS tablet Take 1 tablet (2.5 mg total) by mouth 2 (two) times daily. 180 tablet 3   atorvastatin (LIPITOR) 20 MG tablet Take 1 tablet (20 mg total) by mouth at bedtime.     furosemide (LASIX) 40 MG tablet Take 1 tablet (40 mg total) by mouth daily as needed. If weight is up or for fluid 30 tablet 1   loratadine (CLARITIN) 10 MG tablet Take 10 mg by mouth daily.     losartan (COZAAR) 100 MG tablet Take 1 tablet (100 mg total) by mouth daily. 90 tablet 3   Multiple Vitamin (MULTIVITAMIN ADULT PO) Take 1 tablet by mouth daily.     ondansetron (ZOFRAN-ODT) 4 MG disintegrating tablet Take 1 tablet (4 mg total) by mouth every 8 (eight) hours as needed for nausea or vomiting. 20 tablet 0   potassium chloride SA (KLOR-CON M) 20 MEQ tablet Take 20 mEq by mouth daily.     metoprolol succinate (TOPROL XL) 25 MG 24 hr tablet Take 1 tablet (25 mg) by mouth once daily 45 tablet 3   No current facility-administered medications for this visit.     Family History    Family History  Problem Relation Age of Onset   Alzheimer's disease Mother    Pancreatic cancer Father    Parkinson's disease Brother     Coronary artery disease Neg Hx    Diabetes Neg Hx    Cancer Neg Hx        breast or colon   She indicated that her mother is deceased. She indicated that her father is deceased. She indicated that both of her brothers are alive. She indicated that the status of her neg hx is unknown.  Social History    Social History   Socioeconomic History   Marital status: Widowed    Spouse name: Not on file   Number of children: 4   Years of education: Not on file   Highest education level: Not on file  Occupational History   Occupation: retired- Therapist, sports,   Tobacco Use   Smoking status: Former    Types: Cigarettes    Quit date: 03/17/2021    Years since quitting: 1.0  Passive exposure: Never   Smokeless tobacco: Never   Tobacco comments:    occassionally  Vaping Use   Vaping Use: Never used  Substance and Sexual Activity   Alcohol use: Yes    Alcohol/week: 7.0 standard drinks of alcohol    Types: 7 Glasses of wine per week    Comment: regular wine with dinner   Drug use: No   Sexual activity: Never  Other Topics Concern   Not on file  Social History Narrative   Widowed 2/11 -2nd for him , 1st for her. 4 step sons. 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  Endorses fatigue Cardiovascular:  No chest pain, dyspnea on exertion, edema, orthopnea, palpitations, paroxysmal nocturnal  dyspnea. Dermatological: No rash, lesions/masses Respiratory: No cough, dyspnea Urologic: No hematuria, dysuria Abdominal:   No nausea, vomiting, diarrhea, bright red blood per rectum, melena, or hematemesis Neurologic:  No visual changes, wkns, changes in mental status. All other systems reviewed and are otherwise negative except as noted above.   Physical Exam    VS:  BP (!) 160/50 (BP Location: Left Arm, Patient Position: Sitting, Cuff Size: Normal)   Pulse 69   Ht '5\' 4"'$  (1.626 m)   Wt 139 lb 2 oz (63.1 kg)   LMP  (LMP Unknown)   SpO2 95%   BMI 23.88 kg/m  , BMI Body mass index is 23.88 kg/m.     Vitals:   03/17/22 0230 03/17/22 1411  BP: (!) 166/61 (!) 160/50    GEN: Well nourished, well developed, in no acute distress. HEENT: normal. Glasses on Neck: Supple, no JVD, carotid bruits, or masses. Cardiac: RRR, no murmurs, rubs, or gallops. No clubbing, cyanosis, edema.  Radials/DP/PT 2+ and equal bilaterally.  Respiratory:  Respirations regular and unlabored, clear to auscultation bilaterally. GI: Soft, nontender, nondistended, BS + x 4. MS: no deformity or atrophy. Skin: warm and dry, no rash. Neuro:  Strength and sensation are intact. Psych: Normal affect.  Accessory Clinical Findings    ECG personally reviewed by me today-no new tracings completed today  Lab Results  Component Value Date   WBC 25.7 (H) 02/24/2022   HGB 10.4 (L) 02/24/2022   HCT 31.9 (L) 02/24/2022   MCV 104.9 (H) 02/24/2022   PLT 154 02/24/2022   Lab Results  Component Value Date   CREATININE 1.66 (H) 03/04/2022   BUN 25 (H) 03/04/2022   NA 137 03/04/2022   K 3.5 03/04/2022   CL 105 03/04/2022   CO2 26 03/04/2022   Lab Results  Component Value Date   ALT 13 01/10/2022   AST 29 01/10/2022   GGT 26 03/30/2012   ALKPHOS 65 01/10/2022   BILITOT 0.8 01/10/2022   Lab Results  Component Value Date   CHOL 164 02/21/2020  HDL 34.40 (L) 02/21/2020   LDLCALC 91 02/21/2020   LDLDIRECT  89.7 01/15/2014   TRIG 192.0 (H) 02/21/2020   CHOLHDL 5 02/21/2020    Lab Results  Component Value Date   HGBA1C 4.9 04/03/2021    Assessment & Plan   1.  Paroxysmal atrial fibrillation remains in sinus rhythm today.  She has tolerated apixaban 2.5 mg twice daily for CHA2DS2-VASc score of at least 5 but without incident.  She is still concerned on the risk of a stroke even on a blood thinner.  She is also being continued on Toprol-XL with increased dose of 25 mg daily from 12.5 mg daily due to slightly elevated blood pressure today.  2.  Chronic diastolic heart failure with some occasional fatigue and peripheral edema.  She continues to remain euvolemic on exam today recent echocardiogram completed showed LVEF of 60 to 65%, no regional wall motion abnormalities, G1 DD, and mild calcification of the aortic valve with mild to moderate aortic regurgitation.  She is continued on Toprol-XL, losartan, and furosemide as needed.  3.  Essential hypertension with blood pressure today 116/50 repeat 166/61.  She is continued on amlodipine 5 mg, furosemide 40 mg as needed, losartan 100 mg daily, and her Toprol-XL 12.5 mg is increased to 25 mg daily.  She is to continue to monitor blood pressure at the facility if she continues to reside at [facility].  4.  Peripheral arterial disease with abdominal aortic aneurysm with severe right common iliac stenosis, right internal iliac artery remains occluded in its origin but reconstitutes distally unchanged from CT.  Latest CT completed 01/10/2022 revealed obvious CAM SMA disease with CA imaging incomplete but persistent disease into the CA branches without obvious atherosclerotic disease distally.  It was evaluated by vascular in the emergency department the patient was not considered a candidate for surgical intervention due to her age and comorbidities.  5.  COPD with long history of smoking chronic cough likely due to chronic bronchitis.  Echocardiogram remained  stable.  She remains euvolemic on exam and denies shortness of breath and peripheral edema today.  6.  Hyperlipidemia she is continued on Lipitor 20 mg daily.  This continues to be monitored by PCP.  7.  Disposition patient return to clinic to see MD/APP in 3 months or sooner if needed.  Mera Gunkel, NP 03/17/2022, 3:16 PM

## 2022-03-17 ENCOUNTER — Encounter: Payer: Self-pay | Admitting: Cardiology

## 2022-03-17 ENCOUNTER — Ambulatory Visit: Payer: Medicare Other | Attending: Cardiology | Admitting: Cardiology

## 2022-03-17 VITALS — BP 160/50 | HR 69 | Ht 64.0 in | Wt 139.1 lb

## 2022-03-17 DIAGNOSIS — E782 Mixed hyperlipidemia: Secondary | ICD-10-CM

## 2022-03-17 DIAGNOSIS — I10 Essential (primary) hypertension: Secondary | ICD-10-CM | POA: Diagnosis not present

## 2022-03-17 DIAGNOSIS — I739 Peripheral vascular disease, unspecified: Secondary | ICD-10-CM

## 2022-03-17 DIAGNOSIS — I5032 Chronic diastolic (congestive) heart failure: Secondary | ICD-10-CM

## 2022-03-17 DIAGNOSIS — I480 Paroxysmal atrial fibrillation: Secondary | ICD-10-CM

## 2022-03-17 MED ORDER — METOPROLOL SUCCINATE ER 25 MG PO TB24: ORAL_TABLET | ORAL | 3 refills | Status: DC

## 2022-03-17 NOTE — Patient Instructions (Signed)
Medication Instructions:  - Your physician has recommended you make the following change in your medication:   1) INCREASE Metoprolol succinate 25 mg: - take 1 tablet by mouth once daily   *If you need a refill on your cardiac medications before your next appointment, please call your pharmacy*  Lab Work: - none ordered  If you have labs (blood work) drawn today and your tests are completely normal, you will receive your results only by: [place] (if you have MyChart) OR A paper copy in the mail If you have any lab test that is abnormal or we need to change your treatment, we will call you to review the results.   Testing/Procedures: - none ordered   Follow-Up: At [facility], you and your health needs are our priority.  As part of our continuing mission to provide you with exceptional heart care, we have created designated Provider Care Teams.  These Care Teams include your primary Cardiologist (physician) and Advanced Practice Providers (APPs -  Physician Assistants and Nurse Practitioners) who all work together to provide you with the care you need, when you need it.  We recommend signing up for the patient portal called "MyChart".  Sign up information is provided on this After Visit Summary.  MyChart is used to connect with patients for Virtual Visits (Telemedicine).  Patients are able to view lab/test results, encounter notes, upcoming appointments, etc.  Non-urgent messages can be sent to your provider as well.   To learn more about what you can do with MyChart, go to [email or web address].    Your next appointment:   3 month(s)  The format for your next appointment:   In Person  Provider:   You may see Ida Rogue, MD or one of the following Advanced Practice Providers on your designated Care Team:    Gerrie Nordmann, NP    Other Instructions N/a  Important Information About Sugar

## 2022-04-30 ENCOUNTER — Non-Acute Institutional Stay: Payer: Medicare Other | Admitting: Hospice

## 2022-04-30 DIAGNOSIS — J449 Chronic obstructive pulmonary disease, unspecified: Secondary | ICD-10-CM

## 2022-04-30 DIAGNOSIS — Z515 Encounter for palliative care: Secondary | ICD-10-CM | POA: Diagnosis not present

## 2022-04-30 NOTE — Addendum Note (Signed)
Addended by: Laverda Sorenson I on: 04/30/2022 04:24 PM   Modules accepted: Level of Service

## 2022-04-30 NOTE — Progress Notes (Signed)
[facility] Palliative Care Consult Note Telephone: [phone]  Fax: [phone]  PATIENT NAME: Tiffany Martinez [place] [id number] [phone] (home)  DOB: 05-20-1933 MRN: [id number]  PRIMARY CARE PROVIDER:    Venia Carbon, MD,  [place] [address] [phone]  REFERRING PROVIDER:   Venia Carbon, MD [place],  [place] [address] [phone]  RESPONSIBLE PARTY:    Contact Information   None on File     TELEHEALTH VISIT STATEMENT Due to the COVID-19 crisis, this visit was done via telemedicine from my office and it was initiated and consent by this patient and or family.  I connected with patient OR PROXY by a telephone/video  and verified that I am speaking with the correct person. I discussed the limitations of evaluation and management by telemedicine. The patient expressed understanding and agreed to proceed. Palliative Care was asked to follow this patient to address advance care planning, complex medical decision making and goals of care clarification. This is the initial visit.   Previous efforts by Phineas Real NP to connect with patient was unsuccessful.  After initial discussions with patient, she stated that she is not interested in palliative service.  NP will advise Admin to discharge, and inform referral provider.    ASSESSMENT AND / RECOMMENDATIONS:   Advance Care Planning: Our advance care planning conversation included a discussion about:    The value and importance of advance care planning   CODE STATUS: Available records in Epic indicate patient is a DNR  Symptom Management/Plan: COPD: She reports no recent exacerbation.  Continue ongoing treatment and follow-up with pulmonologist as planned.  HOSPICE ELIGIBILITY/DIAGNOSIS: TBD  Chief Complaint: Initial Palliative care visit  HISTORY OF PRESENT ILLNESS:  Tiffany Martinez is a 88 y.o. year old  female  with multiple morbidities requiring close monitoring and with high risk of complications and  mortality: COPD, diastolic congestive heart failure, hypertensive heart disease, anxiety, AAA.  Patient reports doing well, and not interested in palliative services at this time. History obtained from review of EMR, and brief discussion with Tiffany Martinez.  Review and summarization of Epic records shows history from other than patient. Rest of 10 point ROS asked and negative.  Independent interpretation of tests and reviewed as needed, available labs, patient records, imaging, studies and related documents from the EMR.  PAST MEDICAL HISTORY:  Active Ambulatory Problems    Diagnosis Date Noted   Hyperlipidemia 06/23/2009   Chronic anemia 04/02/2010   Episodic mood disorder ([place]) 06/23/2009   Essential hypertension, benign 06/23/2009   VENOUS INSUFFICIENCY, CHRONIC 04/02/2010   History of squamous cell carcinoma of skin 09/28/2010   Peripheral arterial disease ([place]) 12/21/2010   History of melanoma 12/24/2011   Routine general medical examination at a health care facility 07/07/2012   AAA (abdominal aortic aneurysm)    Advanced directives, counseling/discussion 01/15/2014   Mesenteric ischemia (HCC) 09/16/2016   Chronic diastolic heart failure ([place]) 08/25/2017   Aortic atherosclerosis ([place]) 09/14/2017   CLL (chronic lymphocytic leukemia) ([place]) 09/14/2017   COPD (chronic obstructive pulmonary disease) ([place]) 12/26/2017   Chronic renal disease, stage IV ([place]) 12/26/2017   Insomnia 02/02/2018   Secondary hyperparathyroidism of renal origin ([place]) 06/30/2018   Thrombocytopenia ([place]) 03/28/2020   Pelvic fracture ([place]) 04/04/2021   Skin neoplasm 04/24/2021   Gastroenteritis 06/15/2021   DOE (dyspnea on exertion) 12/31/2021   Palpitations 12/31/2021   Resolved Ambulatory Problems  Diagnosis Date Noted   Adjustment disorder with depressed mood 06/23/2009   CHEST PAIN 06/23/2009   Smoker  12/21/2010   Numbness and tingling in right hand 03/15/2011   Acute bronchitis 01/14/2012   Cough 01/26/2012   Intertrigo labialis 01/26/2012   LLQ abdominal pain 08/27/2014   Abdominal pain 12/30/2015   Tobacco use 08/25/2017   Weakness 05/30/2019   Closed right hip fracture ([place]) 10/09/2019   Unsteady gait 10/09/2019   Hypokalemia 10/09/2019   Malnutrition of mild degree ([place]) 11/29/2019   Hip pain 04/02/2021   Lobar pneumonia ([place]) 06/15/2021   Past Medical History:  Diagnosis Date   Anemia    Anxiety    CHF (congestive heart failure) (HCC)    Chronic venous insufficiency    Hx of colonoscopy    Hypertension    Melanoma ([place])    Migraine headache    PAD (peripheral artery disease) ([place]) 2012   PAD (peripheral artery disease) (HCC)    Paroxysmal atrial fibrillation ([place]) 12/2021   Wears dentures     SOCIAL HX:  Social History   Tobacco Use   Smoking status: Former    Types: Cigarettes    Quit date: 03/17/2021    Years since quitting: 1.1    Passive exposure: Never   Smokeless tobacco: Never   Tobacco comments:    occassionally  Substance Use Topics   Alcohol use: Yes    Alcohol/week: 7.0 standard drinks of alcohol    Types: 7 Glasses of wine per week    Comment: regular wine with dinner     FAMILY HX:  Family History  Problem Relation Age of Onset   Alzheimer's disease Mother    Pancreatic cancer Father    Parkinson's disease Brother    Coronary artery disease Neg Hx    Diabetes Neg Hx    Cancer Neg Hx        breast or colon      ALLERGIES:  Allergies  Allergen Reactions   Triamterene Hives      PERTINENT MEDICATIONS:  Outpatient Encounter Medications as of 04/30/2022  Medication Sig   amLODipine (NORVASC) 5 MG tablet Take 1 tablet (5 mg total) by mouth daily.   apixaban (ELIQUIS) 2.5 MG TABS tablet Take 1 tablet (2.5 mg total) by mouth 2 (two) times daily.   atorvastatin (LIPITOR) 20 MG tablet Take 1 tablet (20 mg total) by mouth at bedtime.    furosemide (LASIX) 40 MG tablet Take 1 tablet (40 mg total) by mouth daily as needed. If weight is up or for fluid   loratadine (CLARITIN) 10 MG tablet Take 10 mg by mouth daily.   losartan (COZAAR) 100 MG tablet Take 1 tablet (100 mg total) by mouth daily.   metoprolol succinate (TOPROL XL) 25 MG 24 hr tablet Take 1 tablet (25 mg) by mouth once daily   Multiple Vitamin (MULTIVITAMIN ADULT PO) Take 1 tablet by mouth daily.   ondansetron (ZOFRAN-ODT) 4 MG disintegrating tablet Take 1 tablet (4 mg total) by mouth every 8 (eight) hours as needed for nausea or vomiting.   potassium chloride SA (KLOR-CON M) 20 MEQ tablet Take 20 mEq by mouth daily.   No facility-administered encounter medications on file as of 04/30/2022.   I spent 25 minutes providing this consultation; time includes spent with patient/family, chart review and documentation. More than 50% of the time in this consultation was spent on care coordination.   Thank you for the opportunity to participate in the care  of Ms. Faddis.  The palliative care team will continue to follow. Please call our office at [phone] if we can be of additional assistance.   Note: Portions of this note were generated with [employer]. Dictation errors may occur despite best attempts at proofreading.  Teodoro Spray, NP

## 2022-06-03 ENCOUNTER — Telehealth: Payer: Self-pay | Admitting: Internal Medicine

## 2022-06-03 NOTE — Telephone Encounter (Signed)
Pt called asking to speak to [facility]. Pt wants [facility] to research her last diagnoses & have info for her by 06/10/22. Call back # [phone]

## 2022-06-04 NOTE — Telephone Encounter (Signed)
Spoke to pt. She is wanting to know more about her Mesenteric Ischemia. She is having a hard eating the right things. Wants to get a copy of the CT.

## 2022-06-04 NOTE — Telephone Encounter (Signed)
Spoke to pt. We have printed out some information for her from several places. Will give it to her at her OV 06-10-22.

## 2022-06-10 ENCOUNTER — Encounter: Payer: Self-pay | Admitting: Internal Medicine

## 2022-06-10 ENCOUNTER — Ambulatory Visit (INDEPENDENT_AMBULATORY_CARE_PROVIDER_SITE_OTHER): Payer: Medicare Other | Admitting: Internal Medicine

## 2022-06-10 VITALS — BP 112/64 | HR 58 | Temp 97.2°F | Ht 64.0 in | Wt 133.0 lb

## 2022-06-10 DIAGNOSIS — I5032 Chronic diastolic (congestive) heart failure: Secondary | ICD-10-CM | POA: Diagnosis not present

## 2022-06-10 DIAGNOSIS — I480 Paroxysmal atrial fibrillation: Secondary | ICD-10-CM

## 2022-06-10 DIAGNOSIS — N184 Chronic kidney disease, stage 4 (severe): Secondary | ICD-10-CM | POA: Diagnosis not present

## 2022-06-10 DIAGNOSIS — K559 Vascular disorder of intestine, unspecified: Secondary | ICD-10-CM | POA: Diagnosis not present

## 2022-06-10 DIAGNOSIS — J449 Chronic obstructive pulmonary disease, unspecified: Secondary | ICD-10-CM | POA: Diagnosis not present

## 2022-06-10 NOTE — Assessment & Plan Note (Signed)
Ongoing pain if she is not careful with eating Discussed that pattern can be stable --on statin and if she stops cigarettes

## 2022-06-10 NOTE — Assessment & Plan Note (Signed)
Occasional symptoms On the eliquis 2.'5mg'$  bid and the toprol

## 2022-06-10 NOTE — Assessment & Plan Note (Signed)
Has been stable of late Hold the furosemide Losartan 100 daily

## 2022-06-10 NOTE — Progress Notes (Signed)
Subjective:    Patient ID: Tiffany Martinez, female    DOB: 1934/02/06, 89 y.o.   MRN: [id number]  HPI Here for follow up of CHF and other GI problems  Doing "not great" Having "funny things happen to me" Felt cold and trouble concentrating last night Worried about a stroke---but eventually got to sleep Feels better this morning  No edema No problems with SOB Has been taking the furosemide---not prn based on weight as it states Some dizziness--especially when standing Some sense of heart going fast---usually in bed and brief  Still not abel to eat much Knows she might get stomach upset---so defers eating if she is going out Does do well with salmon--grilled  Also has fettucine alfredo Can also tolerate eggs and chocolate Doesn't tolerate boost or ensure--but does tolerate ice cream  GFR 29-30   Current Outpatient Medications on File Prior to Visit  Medication Sig Dispense Refill   amLODipine (NORVASC) 5 MG tablet Take 1 tablet (5 mg total) by mouth daily. 90 tablet 3   apixaban (ELIQUIS) 2.5 MG TABS tablet Take 1 tablet (2.5 mg total) by mouth 2 (two) times daily. (Patient taking differently: Take 2.5 mg by mouth 2 (two) times daily. Pt only taking 1 daily) 180 tablet 3   atorvastatin (LIPITOR) 20 MG tablet Take 1 tablet (20 mg total) by mouth at bedtime.     furosemide (LASIX) 40 MG tablet Take 1 tablet (40 mg total) by mouth daily as needed. If weight is up or for fluid 30 tablet 1   loratadine (CLARITIN) 10 MG tablet Take 10 mg by mouth daily.     losartan (COZAAR) 100 MG tablet Take 1 tablet (100 mg total) by mouth daily. 90 tablet 3   metoprolol succinate (TOPROL XL) 25 MG 24 hr tablet Take 1 tablet (25 mg) by mouth once daily 90 tablet 3   Multiple Vitamin (MULTIVITAMIN ADULT PO) Take 1 tablet by mouth daily.     ondansetron (ZOFRAN-ODT) 4 MG disintegrating tablet Take 1 tablet (4 mg total) by mouth every 8 (eight) hours as needed for nausea or vomiting. 20 tablet 0    potassium chloride SA (KLOR-CON M) 20 MEQ tablet Take 20 mEq by mouth daily.     No current facility-administered medications on file prior to visit.    Allergies  Allergen Reactions   Triamterene Hives    Past Medical History:  Diagnosis Date   AAA (abdominal aortic aneurysm) ([place]) 2012   3.9cm 2012; 2.8cm 12/2021   Anemia    Anxiety    CHF (congestive heart failure) (HCC)    LVEF 60-65% in 2023   Chronic venous insufficiency    CLL (chronic lymphocytic leukemia) ([place]) 2014   Stage 0--by flow cytometry   Hx of colonoscopy    Hyperlipidemia    Hypertension    Melanoma ([place])    Right Arm   Migraine headache    3-4X/yr   PAD (peripheral artery disease) ([place]) 2012   severe R common iliac stenosis, ABI 0.73, mid L common iliac stenosis ABI 1.1   PAD (peripheral artery disease) (HCC)    Paroxysmal atrial fibrillation ([place]) 12/2021   Wears dentures    full upper and lower    Past Surgical History:  Procedure Laterality Date   ABDOMINAL AORTIC ANEURYSM REPAIR  02/16/12   [facility]--Dr Sammuel Hines   CARDIOVASCULAR STRESS TEST  2008   negative   CATARACT EXTRACTION W/PHACO Left 07/16/2019   Procedure: CATARACT EXTRACTION PHACO AND INTRAOCULAR LENS  PLACEMENT (IOC) LEFT 8.26  00:46.6;  Surgeon: Eulogio Bear, MD;  Location: [facility];  Service: Ophthalmology;  Laterality: Left;   DOBUTAMINE STRESS ECHO  11/11   negative   HIP ARTHROPLASTY Right 10/10/2019   Procedure: ARTHROPLASTY BIPOLAR HIP (HEMIARTHROPLASTY);  Surgeon: Corky Mull, MD;  Location: [facility];  Service: Orthopedics;  Laterality: Right;   KNEE ARTHROSCOPY  1998   right   MELANOMA EXCISION  11/09/10   wide excision right upper arm   SQUAMOUS CELL CARCINOMA EXCISION  02/2016   TONSILLECTOMY AND ADENOIDECTOMY  1960    Family History  Problem Relation Age of Onset   Alzheimer's disease Mother    Pancreatic cancer Father    Parkinson's disease Brother    Coronary artery disease Neg Hx    Diabetes Neg Hx     Cancer Neg Hx        breast or colon    Social History   Socioeconomic History   Marital status: Widowed    Spouse name: Not on file   Number of children: 4   Years of education: Not on file   Highest education level: Not on file  Occupational History   Occupation: retired- Therapist, sports,   Tobacco Use   Smoking status: Former    Types: Cigarettes    Quit date: 03/17/2021    Years since quitting: 1.2    Passive exposure: Never   Smokeless tobacco: Never   Tobacco comments:    occassionally  Vaping Use   Vaping Use: Never used  Substance and Sexual Activity   Alcohol use: Yes    Alcohol/week: 7.0 standard drinks of alcohol    Types: 7 Glasses of wine per week    Comment: regular wine with dinner   Drug use: No   Sexual activity: Never  Other Topics Concern   Not on file  Social History Narrative   Widowed 2/11 -2nd for him , 1st for her. 4 step sons. 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         Has living will and health care POA--changing her POA at this point   Requests DNR   Discussed MOST form---but would probably accept IV fluids/antibiotics, etc   Requests no feeding tube         Social Determinants of Health   Financial Resource Strain: Not on file  Food Insecurity: Not on file  Transportation Needs: Not on file  Physical Activity: Not on file  Stress: Not on file  Social Connections: Not on file  Intimate Partner Violence: Not on file   Review of Systems Losing sense of taste--some food aversion due to her pain Still will sneak a cigarette at times--discussed    Objective:   Physical Exam Constitutional:      Appearance: Normal appearance.  Cardiovascular:     Rate and Rhythm: Normal rate and regular rhythm.     Heart sounds: No murmur heard.    No gallop.  Pulmonary:     Effort: Pulmonary effort is normal.     Breath sounds: No rales.     Comments: Decreased breath sounds and slight wheezing Musculoskeletal:      Cervical back: Neck supple.     Right lower leg: No edema.     Left lower leg: No edema.  Lymphadenopathy:     Cervical: No cervical adenopathy.  Neurological:     Mental Status: She is alert.  Assessment & Plan:

## 2022-06-10 NOTE — Assessment & Plan Note (Signed)
Compensated ----and probably overdiuresed since she is taking the furosemide daily Change this to '40mg'$  prn only On losartan '100mg'$  and toprol 25

## 2022-06-10 NOTE — Assessment & Plan Note (Signed)
Urged her to stop all the cigarettes

## 2022-06-10 NOTE — Patient Instructions (Signed)
Please only take the furosemide if you have gained weight or you have leg swelling.

## 2022-06-20 NOTE — Progress Notes (Unsigned)
Cardiology Office Note  Date:  06/21/2022   ID:  Tiffany Martinez, DOB 06-Jun-1933, MRN [id number]  PCP:  Venia Carbon, MD   Chief Complaint  Patient presents with   3 month follow up     Patient c/o chest tightness and dizziness. Medications reviewed by the patient verbally.     HPI:  Tiffany Martinez is a charming 89 year-old woman with past medical history of  lower extremity PAD.  severe right common iliac stenosis   infrarenal AAA. 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 occluded at its origin but reconstitutes distally unchanged on CT 2017 Copd/smoker Hx of falls diastolic CHF/hypertensive heart disease Mesenteric ischemia Presents for f/u of her diastolic CHF, atrial fibrillation born in [place] and spent much time in [place] and [place]  Last seen by myself September 2021 Since then has been seen several times by one of our providers last seen November 2023  echocardiogram September 2023 revealed LVEF of 60 to 65%, no regional wall motion abnormalities, G1 DD, and mild to moderate aortic regurgitation     heart monitor  revealed sinus rhythm with average rate of 66 bpm and she had atrial fibrillation that lasted for about an hour and 47 minutes with an average rate of 70 beats per minutes which was less than 1% burden.  She had 13 supraventricular tachycardia runs that occurred with a maximum 154 bpm with the longest lasting 15 beats    77% stenosis in the celiac and SMA  She was evaluated by vascular in the emergency department.   CTA imaging  SMA was either STO versus CTO in the proximal segment.  IMA remains occluded by EVAR femoral disease as documented.   Vascular did not consider the patient to be a good candidate for open mesenteric bypass  On further discussion today, she continues to have chronic stomach pain, and has to be very for with her diet  Does best with pured, soft mechanical foods, eats ice cream for breakfast,  lots of liquids Can eat salmon , eggs, without pain Feels weak, especially when she has not eaten much, uses a walker For acute severe GI pain, will make herself vomit which often relieves the pain Periodic diarrhea,  Lab work reviewed CR 1.66, BUN 25, potassium 3.5 Reports no longer taking Lasix on a regular basis, denies significant leg swelling  EKG personally reviewed by myself on todays visit Nsr rate 62 bpm, unable to exclude old anterior MI, consider LVH by voltage criteria  Other past medical history reviewed [facility] History of fall 10/2019, mechanical 2 wks later broke ankle S/p nondisplaced fracture  Echo 09/2017  Left ventricle: The cavity size was normal. There was mild    concentric hypertrophy. Systolic function was normal. The    estimated ejection fraction was in the range of 60% to 65%. Wall    motion was normal; there were no regional wall motion    abnormalities. Features are consistent with a pseudonormal left    ventricular filling pattern, with concomitant abnormal relaxation    and increased filling pressure (grade 2 diastolic dysfunction).  - Aortic valve: There was mild regurgitation.  - Left atrium: The atrium was mildly dilated.  - Pulmonary arteries: Systolic pressure was mildly increased. PA    peak pressure: 43 mm Hg (S).   In the hospital 08/2017 LE edema, sob Records reviewed sbp 180, was not taking her htn medications Was prescribed norvasc D/c on lasix  and norvasc  Prior Doppler studies reviewed Abdominal aortic duplex and ABI's showed severe right common iliac stenosis and an ABI of 0.73 on the right with no significant disease on the left and a normal ABI on that side.  There was also notation of a 3.9 cm infrarenal AAA   PMH:   has a past medical history of AAA (abdominal aortic aneurysm) ([place]) (2012), Anemia, Anxiety, CHF (congestive heart failure) ([place]), Chronic venous insufficiency, CLL (chronic lymphocytic leukemia) ([place])  (2014), colonoscopy, Hyperlipidemia, Hypertension, Melanoma ([place]), Migraine headache, PAD (peripheral artery disease) ([place]) (2012), PAD (peripheral artery disease) ([place]), Paroxysmal atrial fibrillation ([place]) (12/2021), and Wears dentures.  PSH:    Past Surgical History:  Procedure Laterality Date   ABDOMINAL AORTIC ANEURYSM REPAIR  02/16/12   [facility]--Dr Sammuel Hines   CARDIOVASCULAR STRESS TEST  2008   negative   CATARACT EXTRACTION W/PHACO Left 07/16/2019   Procedure: CATARACT EXTRACTION PHACO AND INTRAOCULAR LENS PLACEMENT (IOC) LEFT 8.26  00:46.6;  Surgeon: Eulogio Bear, MD;  Location: [facility];  Service: Ophthalmology;  Laterality: Left;   DOBUTAMINE STRESS ECHO  11/11   negative   HIP ARTHROPLASTY Right 10/10/2019   Procedure: ARTHROPLASTY BIPOLAR HIP (HEMIARTHROPLASTY);  Surgeon: Corky Mull, MD;  Location: [facility];  Service: Orthopedics;  Laterality: Right;   KNEE ARTHROSCOPY  1998   right   MELANOMA EXCISION  11/09/10   wide excision right upper arm   SQUAMOUS CELL CARCINOMA EXCISION  02/2016   TONSILLECTOMY AND ADENOIDECTOMY  1960    Current Outpatient Medications  Medication Sig Dispense Refill   amLODipine (NORVASC) 5 MG tablet Take 1 tablet (5 mg total) by mouth daily. 90 tablet 3   apixaban (ELIQUIS) 2.5 MG TABS tablet Take 1 tablet (2.5 mg total) by mouth 2 (two) times daily. 180 tablet 3   atorvastatin (LIPITOR) 20 MG tablet Take 1 tablet (20 mg total) by mouth at bedtime.     loratadine (CLARITIN) 10 MG tablet Take 10 mg by mouth daily.     losartan (COZAAR) 100 MG tablet Take 1 tablet (100 mg total) by mouth daily. 90 tablet 3   metoprolol succinate (TOPROL XL) 25 MG 24 hr tablet Take 1 tablet (25 mg) by mouth once daily 90 tablet 3   Multiple Vitamin (MULTIVITAMIN ADULT PO) Take 1 tablet by mouth daily.     ondansetron (ZOFRAN-ODT) 4 MG disintegrating tablet Take 1 tablet (4 mg total) by mouth every 8 (eight) hours as needed for nausea or vomiting. 20 tablet 0    potassium chloride SA (KLOR-CON M) 20 MEQ tablet Take 20 mEq by mouth daily.     furosemide (LASIX) 40 MG tablet Take 1 tablet (40 mg total) by mouth daily as needed. If weight is up or for fluid (Patient not taking: Reported on 06/21/2022) 30 tablet 1   No current facility-administered medications for this visit.    Allergies:   Triamterene   Social History:  The patient  reports that she has been smoking cigarettes. She has been smoking an average of .5 packs per day. She has never been exposed to tobacco smoke. She has never used smokeless tobacco. She reports current alcohol use of about 7.0 standard drinks of alcohol per week. She reports that she does not use drugs.   Family History:   family history includes Alzheimer's disease in her mother; Pancreatic cancer in her father; Parkinson's disease in her brother.    Review of Systems: Review of Systems  Constitutional: Negative.  HENT: Negative.    Respiratory:  Positive for shortness of breath.   Cardiovascular: Negative.   Gastrointestinal: Negative.   Musculoskeletal: Negative.   Neurological: Negative.   Psychiatric/Behavioral: Negative.    All other systems reviewed and are negative.   PHYSICAL EXAM: VS:  BP (!) 160/50 (BP Location: Left Arm, Patient Position: Sitting, Cuff Size: Normal)   Pulse 62   Ht '5\' 4"'$  (1.626 m)   LMP  (LMP Unknown)   SpO2 90%   BMI 22.83 kg/m  , BMI Body mass index is 22.83 kg/m. Constitutional:  oriented to person, place, and time. No distress.  HENT:  Head: Grossly normal Eyes:  no discharge. No scleral icterus.  Neck: No JVD, no carotid bruits  Cardiovascular: Regular rate and rhythm, no murmurs appreciated Pulmonary/Chest: Clear to auscultation bilaterally, no wheezes or rails Abdominal: Soft.  no distension.  no tenderness.  Musculoskeletal: Normal range of motion Neurological:  normal muscle tone. Coordination normal. No atrophy Skin: Skin warm and dry Psychiatric: normal affect,  pleasant  Recent Labs: 01/10/2022: ALT 13 02/24/2022: Hemoglobin 10.4; Platelets 154 03/04/2022: BUN 25; Creatinine, Ser 1.66; Potassium 3.5; Sodium 137    Lipid Panel Lab Results  Component Value Date   CHOL 164 02/21/2020   HDL 34.40 (L) 02/21/2020   LDLCALC 91 02/21/2020   TRIG 192.0 (H) 02/21/2020      Wt Readings from Last 3 Encounters:  06/10/22 133 lb (60.3 kg)  03/17/22 139 lb 2 oz (63.1 kg)  03/10/22 139 lb (63 kg)     ASSESSMENT AND PLAN:  Problem List Items Addressed This Visit       Cardiology Problems   Chronic diastolic heart failure (HCC) - Primary (Chronic)   Mesenteric ischemia (HCC)   Paroxysmal atrial fibrillation (HCC)   Essential hypertension, benign   Peripheral arterial disease (HCC)   Hyperlipidemia   Other Visit Diagnoses     Syncope and collapse       Celiac artery stenosis (HCC)       SOB (shortness of breath)         Chronic diastolic CHF Off her diuretic, appears euvolemic, no significant leg swelling  Mesenteric ischemia /PAD Not a good surgical candidate for open surgical procedure Not amenable to PCI Has learned to tolerate soft mechanical foods For severe pain, symptoms resolved by self-induced emesis  Fatigue Recovering from hip fracture, gait instability Exacerbated by weight loss, poor p.o. intake Will often nap for 1 hour during the daytime to recharge  Hypertension Blood pressure elevated in the office, recommend she monitor blood pressure at home Will try to avoid higher dose amlodipine as this may exacerbate leg swelling  COPD Smoker, history of chronic cough, Likely chronic bronchitis   Total encounter time more than 30 minutes  Greater than 50% was spent in counseling and coordination of care with the patient    Signed, Esmond Plants, M.D., Ph.D. [place], [place]

## 2022-06-21 ENCOUNTER — Ambulatory Visit: Payer: Medicare Other | Attending: Cardiovascular Disease | Admitting: Cardiovascular Disease

## 2022-06-21 ENCOUNTER — Encounter: Payer: Self-pay | Admitting: Cardiovascular Disease

## 2022-06-21 VITALS — BP 160/50 | HR 62 | Ht 64.0 in

## 2022-06-21 DIAGNOSIS — R0602 Shortness of breath: Secondary | ICD-10-CM | POA: Insufficient documentation

## 2022-06-21 DIAGNOSIS — I771 Stricture of artery: Secondary | ICD-10-CM | POA: Insufficient documentation

## 2022-06-21 DIAGNOSIS — I480 Paroxysmal atrial fibrillation: Secondary | ICD-10-CM | POA: Diagnosis not present

## 2022-06-21 DIAGNOSIS — I5032 Chronic diastolic (congestive) heart failure: Secondary | ICD-10-CM | POA: Insufficient documentation

## 2022-06-21 DIAGNOSIS — R55 Syncope and collapse: Secondary | ICD-10-CM | POA: Diagnosis not present

## 2022-06-21 DIAGNOSIS — E782 Mixed hyperlipidemia: Secondary | ICD-10-CM | POA: Insufficient documentation

## 2022-06-21 DIAGNOSIS — K559 Vascular disorder of intestine, unspecified: Secondary | ICD-10-CM | POA: Insufficient documentation

## 2022-06-21 DIAGNOSIS — I739 Peripheral vascular disease, unspecified: Secondary | ICD-10-CM | POA: Insufficient documentation

## 2022-06-21 DIAGNOSIS — I10 Essential (primary) hypertension: Secondary | ICD-10-CM | POA: Diagnosis not present

## 2022-06-21 NOTE — Patient Instructions (Addendum)
Please monitor blood pressure at home   Medication Instructions:  No changes  If you need a refill on your cardiac medications before your next appointment, please call your pharmacy.   Lab work: No new labs needed  Testing/Procedures: No new testing needed  Follow-Up: At [facility], you and your health needs are our priority.  As part of our continuing mission to provide you with exceptional heart care, we have created designated Provider Care Teams.  These Care Teams include your primary Cardiologist (physician) and Advanced Practice Providers (APPs -  Physician Assistants and Nurse Practitioners) who all work together to provide you with the care you need, when you need it.  You will need a follow up appointment in 6 months  Providers on your designated Care Team:   Murray Hodgkins, NP Christell Faith, PA-C Cadence Kathlen Mody, [place]  COVID-19 Vaccine Information can be found at: [email or web address] For questions related to vaccine distribution or appointments, please email [email or web address] or call [phone].   Dr. Rockey Situ would like you to check your blood pressure dail.  Keep a journal of these daily blood pressure and heart rate readings  It is best to check your BP 1-2 hours after taking your medications to see the medications effectiveness on your BP.    Here are some tips that our clinical pharmacists share for home BP monitoring:          Rest 10 minutes before taking your blood pressure.          Don't smoke or drink caffeinated beverages for at least 30 minutes before.          Take your blood pressure before (not after) you eat.          Sit comfortably with your back supported and both feet on the floor (don't cross your legs).          Elevate your arm to heart level on a table or a desk.          Use the proper sized cuff. It should fit smoothly and snugly around your bare upper arm. There should be  enough room to slip a fingertip under the cuff. The bottom edge of the cuff should be 1 inch above the crease of the elbow.

## 2022-07-07 ENCOUNTER — Other Ambulatory Visit: Payer: Self-pay | Admitting: Cardiology

## 2022-07-07 DIAGNOSIS — I480 Paroxysmal atrial fibrillation: Secondary | ICD-10-CM

## 2022-07-07 NOTE — Telephone Encounter (Signed)
Eliquis 2.33m refill request received. Patient is 89years old, weight-60.3kg, Crea-1.66 on 03/04/22, Diagnosis-Afib, and last seen by Dr. GRockey Situon 06/21/22. Dose is appropriate based on dosing criteria. Will send in refill to requested pharmacy.

## 2022-07-07 NOTE — Telephone Encounter (Signed)
Please review

## 2022-07-30 ENCOUNTER — Encounter: Payer: Self-pay | Admitting: Student

## 2022-07-30 ENCOUNTER — Ambulatory Visit: Payer: Medicare Other | Admitting: Student

## 2022-07-30 VITALS — BP 138/76 | HR 58 | Temp 98.0°F | Ht 64.0 in | Wt 130.0 lb

## 2022-07-30 DIAGNOSIS — I10 Essential (primary) hypertension: Secondary | ICD-10-CM

## 2022-07-30 DIAGNOSIS — K551 Chronic vascular disorders of intestine: Secondary | ICD-10-CM | POA: Diagnosis not present

## 2022-07-30 DIAGNOSIS — F39 Unspecified mood [affective] disorder: Secondary | ICD-10-CM | POA: Diagnosis not present

## 2022-07-30 DIAGNOSIS — I872 Venous insufficiency (chronic) (peripheral): Secondary | ICD-10-CM | POA: Diagnosis not present

## 2022-07-30 DIAGNOSIS — R110 Nausea: Secondary | ICD-10-CM | POA: Diagnosis not present

## 2022-07-30 DIAGNOSIS — J449 Chronic obstructive pulmonary disease, unspecified: Secondary | ICD-10-CM

## 2022-07-30 DIAGNOSIS — I480 Paroxysmal atrial fibrillation: Secondary | ICD-10-CM

## 2022-07-30 DIAGNOSIS — N184 Chronic kidney disease, stage 4 (severe): Secondary | ICD-10-CM

## 2022-07-30 DIAGNOSIS — N2581 Secondary hyperparathyroidism of renal origin: Secondary | ICD-10-CM | POA: Diagnosis not present

## 2022-07-30 DIAGNOSIS — I5032 Chronic diastolic (congestive) heart failure: Secondary | ICD-10-CM

## 2022-07-30 DIAGNOSIS — C9110 Chronic lymphocytic leukemia of B-cell type not having achieved remission: Secondary | ICD-10-CM | POA: Diagnosis not present

## 2022-07-30 DIAGNOSIS — I739 Peripheral vascular disease, unspecified: Secondary | ICD-10-CM

## 2022-07-30 DIAGNOSIS — G3184 Mild cognitive impairment, so stated: Secondary | ICD-10-CM

## 2022-07-30 MED ORDER — ONDANSETRON HCL 4 MG PO TABS: 4.0000 mg | ORAL_TABLET | Freq: Three times a day (TID) | ORAL | 0 refills | Status: DC | PRN

## 2022-07-30 NOTE — Patient Instructions (Addendum)
The lab will come to your home to collect labs on Monday morning at 7AM.   For your nausea, I am sending a new one called ZOFRAN (Ondansetron) 4 mg to take every 8 hours. This medication you will put under your tongue 30 minutes before you eat.   Please write down what you eat, drink, number of times you have diarrhea.

## 2022-07-30 NOTE — Progress Notes (Signed)
Location:  [facility].   Provider: Dr. Earnestine Mealing  Code Status: DNR Goals of Care:     07/30/2022    1:25 PM  Advanced Directives  Does Patient Have a Medical Advance Directive? Yes  Type of Estate agent of [place];Living will;Out of facility DNR (pink MOST or yellow form)  Does patient want to make changes to medical advance directive? No - Patient declined  Copy of Healthcare Power of Attorney in Chart? No - copy requested     Chief Complaint  Patient presents with   Establish Care    NP to Establish Care.    Quality Metric Gaps    To discuss need for Zoster    HPI: Patient is a 89 y.o. female seen today for NP Establishment.    She wanted to meet me and see where she should get care. For the last week she has slept a lot. She has had a lot of falls, diarrhea, and and some n/v. She doesn't eat very much. She doesn't put on clothes often because she has very little warning. She has not had as much pain, but diarrhea. Her diarrhea is tan in color. She hasn't taken any medications for it. She doesn't have fursomeide anymore because she no longer has swelling. She has icecream for breakfast and she has fettucini as well. She has been able to drink wine. She drinks a glass per day with evening meal. It seems to hlep make things settle more easily. Boost "gives me an violent."  The symptoms started back in May 31, 2023.   She does not have family or relatives nearby. She is from [place] originally. She likes to read and she read the history of the wilderness trail. She has lived at [facility] for 13 years. Her husband passed away in May 30, 2009. She was in [place] for 15 years. Her husband was the vice chancelor there for some time. She is related to the [place] and friends of Bill Friday. 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 She met her husband in [place]. She doesn't cook anymore. She eats grocery food. She has an aide once  per week who comes to get her groceries. She used to wride horses, bred them, and she has fallen numeous times.   Memory - MMSe in 2015/05/31 showed score of 20/30. Today's   Medications- She uses a bubble pack . Which she takes well.  Matters most -   Mobility - she has a rollator. She doesn't do any exercise now.     Past Medical History:  Diagnosis Date   AAA (abdominal aortic aneurysm) (HCC) 05-30-10   3.9cm 05/30/10; 2.8cm 12/2021   Anemia    Anxiety    CHF (congestive heart failure) (HCC)    LVEF 60-65% in 2021-05-30   Chronic venous insufficiency    CLL (chronic lymphocytic leukemia) (HCC) 2014   Stage 0--by flow cytometry   Hx of colonoscopy    Hyperlipidemia    Hypertension    Melanoma (HCC)    Right Arm   Migraine headache    3-4X/yr   PAD (peripheral artery disease) (HCC) 05-30-2010   severe R common iliac stenosis, ABI 0.73, mid L common iliac stenosis ABI 1.1   PAD (peripheral artery disease) (HCC)    Paroxysmal atrial fibrillation (HCC) 12/2021   Wears dentures    full upper and lower    Past Surgical History:  Procedure Laterality Date  ABDOMINAL AORTIC ANEURYSM REPAIR  02/16/12   [facility]--Dr Pattricia Boss   CARDIOVASCULAR STRESS TEST  2008   negative   CATARACT EXTRACTION W/PHACO Left 07/16/2019   Procedure: CATARACT EXTRACTION PHACO AND INTRAOCULAR LENS PLACEMENT (IOC) LEFT 8.26  00:46.6;  Surgeon: Nevada Crane, MD;  Location: [facility];  Service: Ophthalmology;  Laterality: Left;   DOBUTAMINE STRESS ECHO  11/11   negative   HIP ARTHROPLASTY Right 10/10/2019   Procedure: ARTHROPLASTY BIPOLAR HIP (HEMIARTHROPLASTY);  Surgeon: Christena Flake, MD;  Location: [facility];  Service: Orthopedics;  Laterality: Right;   KNEE ARTHROSCOPY  1998   right   MELANOMA EXCISION  11/09/10   wide excision right upper arm   SQUAMOUS CELL CARCINOMA EXCISION  02/2016   TONSILLECTOMY AND ADENOIDECTOMY  1960    Allergies  Allergen Reactions   Triamterene Hives    Outpatient Encounter  Medications as of 07/30/2022  Medication Sig   amLODipine (NORVASC) 5 MG tablet Take 1 tablet (5 mg total) by mouth daily.   apixaban (ELIQUIS) 2.5 MG TABS tablet TAKE 1 TABLET BY MOUTH TWICE DAILY   loratadine (CLARITIN) 10 MG tablet Take 10 mg by mouth daily.   losartan (COZAAR) 100 MG tablet Take 1 tablet (100 mg total) by mouth daily.   metoprolol succinate (TOPROL-XL) 25 MG 24 hr tablet Take 12.5 mg by mouth daily.   Multiple Vitamin (MULTIVITAMIN ADULT PO) Take 1 tablet by mouth daily.   ondansetron (ZOFRAN) 4 MG tablet Take 1 tablet (4 mg total) by mouth every 8 (eight) hours as needed for nausea or vomiting.   potassium chloride SA (KLOR-CON M) 20 MEQ tablet Take 20 mEq by mouth daily.   [DISCONTINUED] atorvastatin (LIPITOR) 20 MG tablet Take 1 tablet (20 mg total) by mouth at bedtime.   [DISCONTINUED] furosemide (LASIX) 40 MG tablet Take 1 tablet (40 mg total) by mouth daily as needed. If weight is up or for fluid (Patient not taking: Reported on 06/21/2022)   [DISCONTINUED] metoprolol succinate (TOPROL XL) 25 MG 24 hr tablet Take 1 tablet (25 mg) by mouth once daily (Patient taking differently: 12.5 mg daily.)   [DISCONTINUED] ondansetron (ZOFRAN-ODT) 4 MG disintegrating tablet Take 1 tablet (4 mg total) by mouth every 8 (eight) hours as needed for nausea or vomiting.   No facility-administered encounter medications on file as of 07/30/2022.    Review of Systems:  Review of Systems  Health Maintenance  Topic Date Due   Zoster Vaccines- Shingrix (1 of 2) Never done   Medicare Annual Wellness (AWV)  07/31/2022   INFLUENZA VACCINE  08/15/2022 (Originally 12/15/2021)   MAMMOGRAM  05/17/2048 (Originally 10/01/2011)   COVID-19 Vaccine (5 - 2023-24 season) 08/12/2022   DTaP/Tdap/Td (3 - Tdap) 01/16/2024   Pneumonia Vaccine 36+ Years old  Completed   DEXA SCAN  Completed   HPV VACCINES  Aged Out    Physical Exam: Vitals:   07/30/22 1326  BP: 138/76  Pulse: (!) 58  Temp: 98 F (36.7  C)  SpO2: 92%  Weight: 130 lb (59 kg)  Height: 5\' 4"  (1.626 m)   Body mass index is 22.31 kg/m. Physical Exam Constitutional:      Appearance: Normal appearance.  Cardiovascular:     Rate and Rhythm: Normal rate.     Pulses: Normal pulses.  Pulmonary:     Effort: Pulmonary effort is normal.  Neurological:     Mental Status: She is alert and oriented to person, place, and time.  Labs reviewed: Basic Metabolic Panel: Recent Labs    01/10/22 1615 02/24/22 1320 03/04/22 1103 08/02/22 0000  NA 139 137 137 139  K 3.8 4.2 3.5 3.9  CL 108 107 105 106  CO2 22 25 26  27*  GLUCOSE 134* 112* 103*  --   BUN 36* 36* 25* 26*  CREATININE 1.47* 1.73* 1.66* 1.8*  CALCIUM 9.8 9.5 9.8 9.8   Liver Function Tests: Recent Labs    12/30/21 2057 01/10/22 1615 08/02/22 0000  AST 22 29 20   ALT 14 13 10   ALKPHOS 73 65 68  BILITOT 0.9 0.8  --   PROT 6.8 6.8  --   ALBUMIN 4.1 3.9 3.9   Recent Labs    12/30/21 2057 01/10/22 1615  LIPASE 41 42   No results for input(s): "AMMONIA" in the last 8760 hours. CBC: Recent Labs    12/30/21 2057 01/10/22 1615 02/24/22 1320 08/02/22 0000  WBC 24.8* 26.4* 25.7* 32.0  NEUTROABS  --   --  2.9 3,968.00  HGB 11.1* 11.5* 10.4* 11.1*  HCT 31.8* 33.7* 31.9* 33*  MCV 108.5* 106.0* 104.9*  --   PLT 149* 155 154 129*   Lipid Panel: No results for input(s): "CHOL", "HDL", "LDLCALC", "TRIG", "CHOLHDL", "LDLDIRECT" in the last 8760 hours. Lab Results  Component Value Date   HGBA1C 5.4 08/02/2022    Procedures since last visit: No results found.  Assessment/Plan 1. Nausea Patient with history of nausea. Subacute in nature based on patient's timeline. Hx of mesenteric stenosis. No abdominal pain at this time or vomiting. Will collect CBC, CMP at this time to assess for signs of infection or organ damage. Continue Zofran PRN.   2. Chronic diastolic heart failure (HCC) Patient appears euvolemic on exam. Most recent echocardiogram 01/2022  showed normal LV function 60-65%. Mild hypertrophy of LV, Grade 1 diastolic dysfunction. Moderate aortic valve sclerosis and regurgitation. IVC >50% normal. Continue metoprolol.   3. Venous (peripheral) insufficiency Minimal swelling on exam. No wounds present, CTM.   4. Essential hypertension, benign BP well-controlled at this time. Continue Amlodipine 5 mg daily. Losartan 100 mg daily.   5. Peripheral arterial disease (HCC) Denies claudication or pain at this time.   6. Paroxysmal atrial fibrillation (HCC) No signs of bleeding at this time. Continue apixiban.   7. Chronic obstructive pulmonary disease, unspecified COPD type (HCC) No symptoms or medication at this time, ctm.   8. Chronic renal disease, stage IV (HCC) Progressive disease. CMP ordered.   9. Secondary hyperparathyroidism of renal origin (HCC) Continue check of Vitamin D levels, PTH and Calcium.   10. Episodic mood disorder (HCC) Mood stable per report, CTM.   11. CLL (chronic lymphocytic leukemia) (HCC) Diagnosed in 2014. Most recent CBC notable leukocytosis of 26.   12. Mesenteric artery stenosis (HCC) Hx of MAS diagnosed in 2023. She was previously seen by vascular. No pain on exam today. Some chronic nausea.   13. MCI Patient with notable deficit on slums of 17 today. To be scanned into chart. She has a pill pack which is reassuring, however, patient has significant confusion and disorientation on evaluation. Will contact social work with concern and assessment for additional needs/support. Of note, in 2019 patient's MMSE was 20/30.    Labs/tests ordered:  * No order type specified * Next appt:  Visit date not found   I spent >60 minutes for the care of this patient with 50 percent in face to face time, care coordination, and patient education. Additional  time for chart review.

## 2022-08-02 DIAGNOSIS — D649 Anemia, unspecified: Secondary | ICD-10-CM | POA: Diagnosis not present

## 2022-08-02 DIAGNOSIS — R110 Nausea: Secondary | ICD-10-CM | POA: Diagnosis not present

## 2022-08-02 DIAGNOSIS — E785 Hyperlipidemia, unspecified: Secondary | ICD-10-CM | POA: Diagnosis not present

## 2022-08-02 DIAGNOSIS — I10 Essential (primary) hypertension: Secondary | ICD-10-CM | POA: Diagnosis not present

## 2022-08-02 LAB — CBC AND DIFFERENTIAL
HCT: 33 — AB (ref 36–46)
Hemoglobin: 11.1 — AB (ref 12.0–16.0)
Neutrophils Absolute: 3968
Platelets: 129 10*3/uL — AB (ref 150–400)
WBC: 32

## 2022-08-02 LAB — BASIC METABOLIC PANEL
BUN: 26 — AB (ref 4–21)
CO2: 27 — AB (ref 13–22)
Chloride: 106 (ref 99–108)
Creatinine: 1.8 — AB (ref 0.5–1.1)
Glucose: 95
Potassium: 3.9 mEq/L (ref 3.5–5.1)
Sodium: 139 (ref 137–147)

## 2022-08-02 LAB — COMPREHENSIVE METABOLIC PANEL
Albumin: 3.9 (ref 3.5–5.0)
Calcium: 9.8 (ref 8.7–10.7)
eGFR: 27

## 2022-08-02 LAB — CBC: RBC: 3.18 — AB (ref 3.87–5.11)

## 2022-08-02 LAB — HEMOGLOBIN A1C: Hemoglobin A1C: 5.4

## 2022-08-02 LAB — HEPATIC FUNCTION PANEL
ALT: 10 U/L (ref 7–35)
AST: 20 (ref 13–35)
Alkaline Phosphatase: 68 (ref 25–125)

## 2022-08-06 ENCOUNTER — Telehealth: Payer: Self-pay | Admitting: Internal Medicine

## 2022-08-06 NOTE — Telephone Encounter (Signed)
Spoke with patient and she stated that she will continue with Dr. Unk Lightning.

## 2022-08-06 NOTE — Telephone Encounter (Signed)
-----   Message from Venia Carbon, MD sent at 08/06/2022  8:40 AM EDT ----- Please check with her to see if she will continue with Dr Unk Lightning (and if so, cancel her upcoming appt) ----- Message ----- From: Dewayne Shorter, MD Sent: 08/05/2022  10:59 PM EDT To: Venia Carbon, MD

## 2022-08-06 NOTE — Telephone Encounter (Signed)
Thank you. I have canceled her April appt.

## 2022-08-30 ENCOUNTER — Telehealth: Payer: Self-pay | Admitting: Student

## 2022-08-30 NOTE — Telephone Encounter (Signed)
Pt called asking if her cpe was still scheduled on 09/09/22 with [place], told pt the appt was cancelled. Pt requested a call back from [place], pt wouldn't stated reason for call back. Call back # [phone]

## 2022-09-09 ENCOUNTER — Encounter: Payer: Medicare Other | Admitting: Internal Medicine

## 2022-09-29 ENCOUNTER — Ambulatory Visit: Payer: Medicare Other | Admitting: Student

## 2022-09-29 ENCOUNTER — Encounter: Payer: Self-pay | Admitting: Student

## 2022-09-29 VITALS — BP 138/68 | HR 70 | Temp 97.9°F | Ht 64.0 in | Wt 130.0 lb

## 2022-09-29 DIAGNOSIS — N2581 Secondary hyperparathyroidism of renal origin: Secondary | ICD-10-CM | POA: Diagnosis not present

## 2022-09-29 DIAGNOSIS — F03B0 Unspecified dementia, moderate, without behavioral disturbance, psychotic disturbance, mood disturbance, and anxiety: Secondary | ICD-10-CM

## 2022-09-29 DIAGNOSIS — C9110 Chronic lymphocytic leukemia of B-cell type not having achieved remission: Secondary | ICD-10-CM | POA: Diagnosis not present

## 2022-09-29 DIAGNOSIS — I5032 Chronic diastolic (congestive) heart failure: Secondary | ICD-10-CM

## 2022-09-29 DIAGNOSIS — N184 Chronic kidney disease, stage 4 (severe): Secondary | ICD-10-CM

## 2022-09-29 DIAGNOSIS — I480 Paroxysmal atrial fibrillation: Secondary | ICD-10-CM

## 2022-09-29 DIAGNOSIS — K559 Vascular disorder of intestine, unspecified: Secondary | ICD-10-CM

## 2022-09-29 DIAGNOSIS — I10 Essential (primary) hypertension: Secondary | ICD-10-CM | POA: Diagnosis not present

## 2022-09-29 NOTE — Progress Notes (Signed)
Location:  [facility].   Provider: Dr. Earnestine Mealing  Code Status: DNR Goals of Care:     09/29/2022    1:13 PM  Advanced Directives  Does Patient Have a Medical Advance Directive? Yes  Type of Estate agent of [place];Out of facility DNR (pink MOST or yellow form);Living will  Does patient want to make changes to medical advance directive? No - Patient declined  Copy of Healthcare Power of Attorney in Chart? No - copy requested     Chief Complaint  Patient presents with   Medical Management of Chronic Issues    Medical Management of Chronic Issues. 2 Month Follow up   Quality Metric Gaps    To discuss Need for Zoster, Covid and AWV or postpone if Patient refuses.     HPI: Patient is a 89 y.o. female seen today for medical management of chronic diseases.    She has not had diarrhea, nausea, or vomiting since 07/30/2022! She says this is a miracle. She has a list of foods that she typically eats for breakfast lunch and dinner.   She hasn't needed nausea medication either.   She previously had so much vomiting and diarrhea that they gave her  She had CT scan back in August which showed mesenteric ischemia.   She smokes a carton of cigarettes once a month-- doesn't have a specific number of cigarettes that she smokes.   She has to make a lot of decisions and doesn't have have family locally. She has a trust that helps manage finances.   She is not interested in chemotherapy. She feels exhausted and she has an aid who comes to get groceries. She no longer drives. She isn't interested in more information.   She has a bubble pack and still has furosemide in the pack. Needs help with making sure the medications are correct.   Past Medical History:  Diagnosis Date   AAA (abdominal aortic aneurysm) (HCC) 2012   3.9cm 2012; 2.8cm 12/2021   Anemia    Anxiety    CHF (congestive heart failure) (HCC)    LVEF 60-65% in 2023   Chronic venous  insufficiency    CLL (chronic lymphocytic leukemia) (HCC) 2014   Stage 0--by flow cytometry   Hx of colonoscopy    Hyperlipidemia    Hypertension    Melanoma (HCC)    Right Arm   Migraine headache    3-4X/yr   PAD (peripheral artery disease) (HCC) 2012   severe R common iliac stenosis, ABI 0.73, mid L common iliac stenosis ABI 1.1   PAD (peripheral artery disease) (HCC)    Paroxysmal atrial fibrillation (HCC) 12/2021   Wears dentures    full upper and lower    Past Surgical History:  Procedure Laterality Date   ABDOMINAL AORTIC ANEURYSM REPAIR  02/16/12   [facility]--Dr Pattricia Boss   CARDIOVASCULAR STRESS TEST  2008   negative   CATARACT EXTRACTION W/PHACO Left 07/16/2019   Procedure: CATARACT EXTRACTION PHACO AND INTRAOCULAR LENS PLACEMENT (IOC) LEFT 8.26  00:46.6;  Surgeon: Nevada Crane, MD;  Location: [facility];  Service: Ophthalmology;  Laterality: Left;   DOBUTAMINE STRESS ECHO  11/11   negative   HIP ARTHROPLASTY Right 10/10/2019   Procedure: ARTHROPLASTY BIPOLAR HIP (HEMIARTHROPLASTY);  Surgeon: Christena Flake, MD;  Location: [facility];  Service: Orthopedics;  Laterality: Right;   KNEE ARTHROSCOPY  1998   right   MELANOMA EXCISION  11/09/10   wide excision right  upper arm   SQUAMOUS CELL CARCINOMA EXCISION  02/2016   TONSILLECTOMY AND ADENOIDECTOMY  1960    Allergies  Allergen Reactions   Triamterene Hives    Outpatient Encounter Medications as of 09/29/2022  Medication Sig   amLODipine (NORVASC) 5 MG tablet Take 1 tablet (5 mg total) by mouth daily.   apixaban (ELIQUIS) 2.5 MG TABS tablet TAKE 1 TABLET BY MOUTH TWICE DAILY   loratadine (CLARITIN) 10 MG tablet Take 10 mg by mouth daily.   losartan (COZAAR) 100 MG tablet Take 1 tablet (100 mg total) by mouth daily.   metoprolol succinate (TOPROL-XL) 25 MG 24 hr tablet Take 12.5 mg by mouth daily.   Multiple Vitamin (MULTIVITAMIN ADULT PO) Take 1 tablet by mouth daily.   potassium chloride SA (KLOR-CON M) 20 MEQ  tablet Take 20 mEq by mouth daily.   [DISCONTINUED] ondansetron (ZOFRAN) 4 MG tablet Take 1 tablet (4 mg total) by mouth every 8 (eight) hours as needed for nausea or vomiting.   No facility-administered encounter medications on file as of 09/29/2022.    Review of Systems:  Review of Systems  Health Maintenance  Topic Date Due   Zoster Vaccines- Shingrix (1 of 2) Never done   Medicare Annual Wellness (AWV)  07/31/2022   COVID-19 Vaccine (5 - 2023-24 season) 08/12/2022   MAMMOGRAM  05/17/2048 (Originally 10/01/2011)   INFLUENZA VACCINE  12/16/2022   DTaP/Tdap/Td (3 - Tdap) 01/16/2024   Pneumonia Vaccine 17+ Years old  Completed   DEXA SCAN  Completed   HPV VACCINES  Aged Out    Physical Exam: Vitals:   09/29/22 1308  BP: 138/68  Pulse: 70  Temp: 97.9 F (36.6 C)  SpO2: 97%  Weight: 130 lb (59 kg)  Height: 5\' 4"  (1.626 m)   Body mass index is 22.31 kg/m. Physical Exam Cardiovascular:     Rate and Rhythm: Normal rate.     Pulses: Normal pulses.  Pulmonary:     Effort: Pulmonary effort is normal.     Breath sounds: Rhonchi present.  Abdominal:     General: Abdomen is flat.     Palpations: Abdomen is soft.  Neurological:     General: No focal deficit present.     Mental Status: She is alert. Mental status is at baseline.     Comments: Oriented to self and place, but difficult time remembering events.      Labs reviewed: Basic Metabolic Panel: Recent Labs    01/10/22 1615 02/24/22 1320 03/04/22 1103 08/02/22 0000  NA 139 137 137 139  K 3.8 4.2 3.5 3.9  CL 108 107 105 106  CO2 22 25 26  27*  GLUCOSE 134* 112* 103*  --   BUN 36* 36* 25* 26*  CREATININE 1.47* 1.73* 1.66* 1.8*  CALCIUM 9.8 9.5 9.8 9.8   Liver Function Tests: Recent Labs    12/30/21 2057 01/10/22 1615 08/02/22 0000  AST 22 29 20   ALT 14 13 10   ALKPHOS 73 65 68  BILITOT 0.9 0.8  --   PROT 6.8 6.8  --   ALBUMIN 4.1 3.9 3.9   Recent Labs    12/30/21 2057 01/10/22 1615  LIPASE 41 42    No results for input(s): "AMMONIA" in the last 8760 hours. CBC: Recent Labs    12/30/21 2057 01/10/22 1615 02/24/22 1320 08/02/22 0000  WBC 24.8* 26.4* 25.7* 32.0  NEUTROABS  --   --  2.9 3,968.00  HGB 11.1* 11.5* 10.4* 11.1*  HCT 31.8*  33.7* 31.9* 33*  MCV 108.5* 106.0* 104.9*  --   PLT 149* 155 154 129*   Lipid Panel: No results for input(s): "CHOL", "HDL", "LDLCALC", "TRIG", "CHOLHDL", "LDLDIRECT" in the last 8760 hours. Lab Results  Component Value Date   HGBA1C 5.4 08/02/2022    Procedures since last visit: No results found.  Assessment/Plan 1. Essential hypertension, benign BP well-controlled at this time. Continue Norvasc and losartan.   2. Chronic renal disease, stage IV (HCC) Stable with most recent labs. Continue quartlery BMP.   3. Chronic diastolic heart failure (HCC) Euvolemic at this time. Will discontinue potassium supplementation. Verify with pharmacy that patient has furosemide discontinued. Request for them to mail a new, updated pill pack.   4. CLL (chronic lymphocytic leukemia) (HCC) Most recent WBC elevated. Patient declines chemotherapy or treatment and prefers monitoring at this time. Will plan to repeat labs for follow up.   5. Mesenteric ischemia (HCC) No symptoms at this time. Continue eliquis 2.5 mg BID   6. Paroxysmal atrial fibrillation (HCC) Rate well-controlled with Toprol 25 mg . Continue eliquis 2.5 mg BID  7. Secondary hyperparathyroidism of renal origin (HCC) Continue to monitor PTH, Vitamin D, and calcium with quarterly labs.   8. Moderate dementia SLUMS 2024 16/30. No treatment at this time. Continue discussions with campus nursing and social work to determine need to transition to higher level of care. Currently receives pill pack and some help with cleaning. She doesn't cook and has some food delivered periodically. Groceries delivered as well. No driving-- uses campus transport. She has done well with her current routine. No  local family, however, she has a trust who helps with her affairs. Continue conversations and continue providing supportive care.   Labs/tests ordered:  * No order type specified * Next appt:  11/08/2022

## 2022-09-29 NOTE — Patient Instructions (Addendum)
It was so nice to see you today!  I would like to see you in 3 months. I would like you to get labs before that appointment-- CBC with differential and BMP to check on your electrolytes. We will have them come to your home to collect your blood.

## 2022-10-01 ENCOUNTER — Telehealth: Payer: Self-pay

## 2022-10-01 MED ORDER — METOPROLOL SUCCINATE ER 25 MG PO TB24
12.5000 mg | ORAL_TABLET | Freq: Every day | ORAL | 3 refills | Status: DC
Start: 2022-10-01 — End: 2023-06-23

## 2022-10-01 MED ORDER — LOSARTAN POTASSIUM 100 MG PO TABS
100.0000 mg | ORAL_TABLET | Freq: Every day | ORAL | 3 refills | Status: AC
Start: 2022-10-01 — End: ?

## 2022-10-01 MED ORDER — AMLODIPINE BESYLATE 5 MG PO TABS: 5.0000 mg | ORAL_TABLET | Freq: Every day | ORAL | 3 refills | Status: DC

## 2022-10-01 NOTE — Telephone Encounter (Signed)
Copied and pasted due to platform used to respond to message:   Earnestine Mealing, MD  You; May, Anita A, [place] minutes ago (2:10 PM)   VB She is correct. I was waiting on a response from Pharmacy. Patient is to have Lasix and potassium discontinued. Since we don't have a way to check her medications, she can continue her current medications until she receives her new pill pack.   Discussed above response with patient and she verbalized understanding

## 2022-10-01 NOTE — Telephone Encounter (Signed)
Incoming call received from patient. Patient stated " I seen Dr.Beamer on Wednesday, she was to send over a new rx for electrolytes and I have yet to receive it, can you please find out what is going on before the weekend."

## 2022-10-26 ENCOUNTER — Telehealth: Payer: Self-pay | Admitting: Nurse Practitioner

## 2022-10-26 NOTE — Telephone Encounter (Signed)
Patient will call back to schedule AWV was not home at time of phone call.Marland KitchenMarland Kitchen

## 2022-11-08 ENCOUNTER — Ambulatory Visit: Payer: Medicare Other | Admitting: Student

## 2022-12-01 DIAGNOSIS — H25811 Combined forms of age-related cataract, right eye: Secondary | ICD-10-CM | POA: Diagnosis not present

## 2022-12-27 DIAGNOSIS — I10 Essential (primary) hypertension: Secondary | ICD-10-CM | POA: Diagnosis not present

## 2022-12-27 DIAGNOSIS — N184 Chronic kidney disease, stage 4 (severe): Secondary | ICD-10-CM | POA: Diagnosis not present

## 2023-01-03 ENCOUNTER — Ambulatory Visit: Payer: Medicare Other | Admitting: Student

## 2023-01-05 ENCOUNTER — Ambulatory Visit: Payer: Medicare Other | Admitting: Student

## 2023-01-05 ENCOUNTER — Encounter: Payer: Self-pay | Admitting: Student

## 2023-01-05 VITALS — BP 162/64 | HR 58 | Temp 97.9°F | Ht 64.0 in | Wt 135.0 lb

## 2023-01-05 DIAGNOSIS — K559 Vascular disorder of intestine, unspecified: Secondary | ICD-10-CM

## 2023-01-05 DIAGNOSIS — I10 Essential (primary) hypertension: Secondary | ICD-10-CM | POA: Diagnosis not present

## 2023-01-05 DIAGNOSIS — N2581 Secondary hyperparathyroidism of renal origin: Secondary | ICD-10-CM

## 2023-01-05 DIAGNOSIS — N184 Chronic kidney disease, stage 4 (severe): Secondary | ICD-10-CM

## 2023-01-05 DIAGNOSIS — F03B0 Unspecified dementia, moderate, without behavioral disturbance, psychotic disturbance, mood disturbance, and anxiety: Secondary | ICD-10-CM

## 2023-01-05 DIAGNOSIS — I480 Paroxysmal atrial fibrillation: Secondary | ICD-10-CM

## 2023-01-05 DIAGNOSIS — I5032 Chronic diastolic (congestive) heart failure: Secondary | ICD-10-CM | POA: Diagnosis not present

## 2023-01-05 DIAGNOSIS — C9110 Chronic lymphocytic leukemia of B-cell type not having achieved remission: Secondary | ICD-10-CM | POA: Diagnosis not present

## 2023-01-05 DIAGNOSIS — I739 Peripheral vascular disease, unspecified: Secondary | ICD-10-CM | POA: Diagnosis not present

## 2023-01-05 NOTE — Patient Instructions (Addendum)
Nice to see you today!  Please do not nap during the day and try to sleep at night more.   Consider going to different activities to engage with peers.   Your kidney function is stable at this time.   You are due for some vaccines: Shingles vaccine and when it's available your flu vaccine.

## 2023-01-05 NOTE — Progress Notes (Unsigned)
Location:  [facility].   Provider: Dr. Earnestine Mealing  Code Status: DNR Goals of Care:     01/05/2023    1:51 PM  Advanced Directives  Does Patient Have a Medical Advance Directive? Yes  Type of Estate agent of [place];Living will;Out of facility DNR (pink MOST or yellow form)  Does patient want to make changes to medical advance directive? No - Patient declined  Copy of Healthcare Power of Attorney in Chart? No - copy requested     Chief Complaint  Patient presents with  . Medical Management of Chronic Issues    Medical Management of Chronic Issues. 3 Month Follow up    HPI: Patient is a 89 y.o. female seen today for medical management of chronic diseases.    She is here today for 3 month follow up. Sh ehas gained 5 lbs since her last appointment with me. She has started eating chicken recently. It's what will stay down. She hasn't had abdominal pain or diarrhea since May (her last visit with me). She remains   She has cataract surgery soon and needs an evaluation before that.   She doesn't drink much fluids -- drinks coffee tea and a glass of water. She doesn't want water very often. She drinks some ginger ale as well.   Denies blood in her urine or stool.   She wants to go to sleep.   She is doing research for a book and she is going to sleep at 11:30 and 4PM she has another one. She goes to bed before 10 now she is upat 4AM. 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   She takes her medicine. She says it (her BP) jumps at times. She doesn't check her BP at home.   Past Medical History:  Diagnosis Date  . AAA (abdominal aortic aneurysm) (HCC) 2012   3.9cm 2012; 2.8cm 12/2021  . Anemia   . Anxiety   . CHF (congestive heart failure) (HCC)    LVEF 60-65% in 2023  . Chronic venous insufficiency   . CLL (chronic lymphocytic leukemia) (HCC) 2014   Stage 0--by flow cytometry  . Hx of colonoscopy   . Hyperlipidemia   .  Hypertension   . Melanoma (HCC)    Right Arm  . Migraine headache    3-4X/yr  . PAD (peripheral artery disease) (HCC) 2012   severe R common iliac stenosis, ABI 0.73, mid L common iliac stenosis ABI 1.1  . PAD (peripheral artery disease) (HCC)   . Paroxysmal atrial fibrillation (HCC) 12/2021  . Wears dentures    full upper and lower    Past Surgical History:  Procedure Laterality Date  . ABDOMINAL AORTIC ANEURYSM REPAIR  02/16/12   [facility]--Dr Pattricia Boss  . CARDIOVASCULAR STRESS TEST  2008   negative  . CATARACT EXTRACTION W/PHACO Left 07/16/2019   Procedure: CATARACT EXTRACTION PHACO AND INTRAOCULAR LENS PLACEMENT (IOC) LEFT 8.26  00:46.6;  Surgeon: Nevada Crane, MD;  Location: [facility];  Service: Ophthalmology;  Laterality: Left;  . DOBUTAMINE STRESS ECHO  11/11   negative  . HIP ARTHROPLASTY Right 10/10/2019   Procedure: ARTHROPLASTY BIPOLAR HIP (HEMIARTHROPLASTY);  Surgeon: Christena Flake, MD;  Location: [facility];  Service: Orthopedics;  Laterality: Right;  . KNEE ARTHROSCOPY  1998   right  . MELANOMA EXCISION  11/09/10   wide excision right upper arm  . SQUAMOUS CELL CARCINOMA EXCISION  02/2016  . TONSILLECTOMY AND ADENOIDECTOMY  1960    Allergies  Allergen Reactions  . Triamterene Hives    Outpatient Encounter Medications as of 01/05/2023  Medication Sig  . amLODipine (NORVASC) 5 MG tablet Take 1 tablet (5 mg total) by mouth daily.  Marland Kitchen apixaban (ELIQUIS) 2.5 MG TABS tablet TAKE 1 TABLET BY MOUTH TWICE DAILY  . loratadine (CLARITIN) 10 MG tablet Take 10 mg by mouth daily.  Marland Kitchen losartan (COZAAR) 100 MG tablet Take 1 tablet (100 mg total) by mouth daily.  . metoprolol succinate (TOPROL-XL) 25 MG 24 hr tablet Take 0.5 tablets (12.5 mg total) by mouth daily.  . Multiple Vitamin (MULTIVITAMIN ADULT PO) Take 1 tablet by mouth daily.   No facility-administered encounter medications on file as of 01/05/2023.    Review of Systems:  Review of Systems  Health Maintenance   Topic Date Due  . Zoster Vaccines- Shingrix (1 of 2) 03/04/1953  . Medicare Annual Wellness (AWV)  07/31/2022  . COVID-19 Vaccine (5 - 2023-24 season) 08/12/2022  . INFLUENZA VACCINE  12/16/2022  . MAMMOGRAM  05/17/2048 (Originally 10/01/2011)  . DTaP/Tdap/Td (3 - Tdap) 01/16/2024  . Pneumonia Vaccine 10+ Years old  Completed  . DEXA SCAN  Completed  . HPV VACCINES  Aged Out    Physical Exam: Vitals:   01/05/23 1349 01/05/23 1352  BP: (!) 162/68 (!) 162/64  Pulse: (!) 58   Temp: 97.9 F (36.6 C)   SpO2: 92%   Weight: 135 lb (61.2 kg)   Height: 5\' 4"  (1.626 m)    Body mass index is 23.17 kg/m. Physical Exam  Labs reviewed: Basic Metabolic Panel: Recent Labs    02/24/22 1320 03/04/22 1103 08/02/22 0000 12/27/22 0814  NA 137 137 139 140  K 4.2 3.5 3.9 4.2  CL 107 105 106 109  CO2 25 26 27* 24  GLUCOSE 112* 103*  --  109*  BUN 36* 25* 26* 22  CREATININE 1.73* 1.66* 1.8* 1.52*  CALCIUM 9.5 9.8 9.8 9.5   Liver Function Tests: Recent Labs    01/10/22 1615 08/02/22 0000  AST 29 20  ALT 13 10  ALKPHOS 65 68  BILITOT 0.8  --   PROT 6.8  --   ALBUMIN 3.9 3.9   Recent Labs    01/10/22 1615  LIPASE 42   No results for input(s): "AMMONIA" in the last 8760 hours. CBC: Recent Labs    01/10/22 1615 02/24/22 1320 08/02/22 0000 12/27/22 0814  WBC 26.4* 25.7* 32.0 24.5*  NEUTROABS  --  2.9 3,968.00 3,455  HGB 11.5* 10.4* 11.1* 11.2*  HCT 33.7* 31.9* 33* 34.2*  MCV 106.0* 104.9*  --  105.6*  PLT 155 154 129* 77*   Lipid Panel: No results for input(s): "CHOL", "HDL", "LDLCALC", "TRIG", "CHOLHDL", "LDLDIRECT" in the last 8760 hours. Lab Results  Component Value Date   HGBA1C 5.4 08/02/2022    Procedures since last visit: No results found.  Assessment/Plan There are no diagnoses linked to this encounter.   Labs/tests ordered:  * No order type specified * Next appt:  Visit date not found

## 2023-01-06 ENCOUNTER — Encounter: Payer: Self-pay | Admitting: Student

## 2023-01-13 ENCOUNTER — Encounter: Payer: Self-pay | Admitting: Nurse Practitioner

## 2023-01-13 ENCOUNTER — Ambulatory Visit (INDEPENDENT_AMBULATORY_CARE_PROVIDER_SITE_OTHER): Payer: Medicare Other | Admitting: Nurse Practitioner

## 2023-01-13 DIAGNOSIS — Z0000 Encounter for general adult medical examination without abnormal findings: Secondary | ICD-10-CM

## 2023-01-13 DIAGNOSIS — E2839 Other primary ovarian failure: Secondary | ICD-10-CM | POA: Diagnosis not present

## 2023-01-13 NOTE — Progress Notes (Signed)
Subjective:   Nicole Haymon is a 89 y.o. female who presents for Medicare Annual (Subsequent) preventive examination.  Visit Complete: In person [place]    Review of Systems     Cardiac Risk Factors include: sedentary lifestyle;smoking/ tobacco exposure;advanced age (>49men, >65 women);dyslipidemia;hypertension     Objective:    Today's Vitals   01/13/23 1109  BP: 138/72  Pulse: 78  Temp: 97.9 F (36.6 C)  SpO2: 94%  Weight: 134 lb (60.8 kg)  Height: 5\' 4"  (1.626 m)   Body mass index is 23 kg/m. Wt Readings from Last 3 Encounters:  01/13/23 134 lb (60.8 kg)  01/05/23 135 lb (61.2 kg)  09/29/22 130 lb (59 kg)        01/13/2023   11:11 AM 01/05/2023    1:51 PM 09/29/2022    1:13 PM 07/30/2022    1:25 PM 01/10/2022    4:01 PM 04/03/2021   11:25 PM 04/02/2021    2:10 PM  Advanced Directives  Does Patient Have a Medical Advance Directive? Yes Yes Yes Yes No No No  Type of Estate agent of [place];Out of facility DNR (pink MOST or yellow form);Living will Healthcare Power of [place];Living will;Out of facility DNR (pink MOST or yellow form) Healthcare Power of [place];Out of facility DNR (pink MOST or yellow form);Living will Healthcare Power of [place];Living will;Out of facility DNR (pink MOST or yellow form)     Does patient want to make changes to medical advance directive? No - Patient declined No - Patient declined No - Patient declined No - Patient declined     Copy of Healthcare Power of Attorney in Chart? No - copy requested No - copy requested No - copy requested No - copy requested     Would patient like information on creating a medical advance directive?     No - Patient declined No - Patient declined     Current Medications (verified) Outpatient Encounter Medications as of 01/13/2023  Medication Sig   amLODipine (NORVASC) 5 MG tablet Take 1 tablet (5 mg total) by mouth daily.   apixaban (ELIQUIS) 2.5 MG TABS tablet TAKE 1 TABLET  BY MOUTH TWICE DAILY   loratadine (CLARITIN) 10 MG tablet Take 10 mg by mouth daily.   losartan (COZAAR) 100 MG tablet Take 1 tablet (100 mg total) by mouth daily.   metoprolol succinate (TOPROL-XL) 25 MG 24 hr tablet Take 0.5 tablets (12.5 mg total) by mouth daily.   Multiple Vitamin (MULTIVITAMIN ADULT PO) Take 1 tablet by mouth daily.   No facility-administered encounter medications on file as of 01/13/2023.    Allergies (verified) Triamterene   History: Past Medical History:  Diagnosis Date   AAA (abdominal aortic aneurysm) (HCC) 2012   3.9cm 2012; 2.8cm 12/2021   Anemia    Anxiety    CHF (congestive heart failure) (HCC)    LVEF 60-65% in 2023   Chronic venous insufficiency    CLL (chronic lymphocytic leukemia) (HCC) 2014   Stage 0--by flow cytometry   Hx of colonoscopy    Hyperlipidemia    Hypertension    Melanoma (HCC)    Right Arm   Migraine headache    3-4X/yr   PAD (peripheral artery disease) (HCC) 2012   severe R common iliac stenosis, ABI 0.73, mid L common iliac stenosis ABI 1.1   PAD (peripheral artery disease) (HCC)    Paroxysmal atrial fibrillation (HCC) 12/2021   Wears dentures    full upper and lower  Past Surgical History:  Procedure Laterality Date   ABDOMINAL AORTIC ANEURYSM REPAIR  02/16/12   [facility]--Dr Pattricia Boss   CARDIOVASCULAR STRESS TEST  2008   negative   CATARACT EXTRACTION W/PHACO Left 07/16/2019   Procedure: CATARACT EXTRACTION PHACO AND INTRAOCULAR LENS PLACEMENT (IOC) LEFT 8.26  00:46.6;  Surgeon: Nevada Crane, MD;  Location: [facility];  Service: Ophthalmology;  Laterality: Left;   DOBUTAMINE STRESS ECHO  11/11   negative   HIP ARTHROPLASTY Right 10/10/2019   Procedure: ARTHROPLASTY BIPOLAR HIP (HEMIARTHROPLASTY);  Surgeon: Christena Flake, MD;  Location: [facility];  Service: Orthopedics;  Laterality: Right;   KNEE ARTHROSCOPY  1998   right   MELANOMA EXCISION  11/09/10   wide excision right upper arm   SQUAMOUS CELL CARCINOMA  EXCISION  02/2016   TONSILLECTOMY AND ADENOIDECTOMY  1960   Family History  Problem Relation Age of Onset   Alzheimer's disease Mother    Pancreatic cancer Father    Parkinson's disease Brother    Coronary artery disease Neg Hx    Diabetes Neg Hx    Cancer Neg Hx        breast or colon   Social History   Socioeconomic History   Marital status: Widowed    Spouse name: Not on file   Number of children: 4   Years of education: Not on file   Highest education level: Not on file  Occupational History   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,   Tobacco Use   Smoking status: Every Day    Current packs/day: 0.00    Types: Cigarettes    Last attempt to quit: 03/17/2021    Years since quitting: 1.8    Passive exposure: Never   Smokeless tobacco: Never   Tobacco comments:    occassionally  Vaping Use   Vaping status: Never Used  Substance and Sexual Activity   Alcohol use: Yes    Alcohol/week: 7.0 standard drinks of alcohol    Types: 7 Glasses of wine per week    Comment: regular wine with dinner   Drug use: No   Sexual activity: Never  Other Topics Concern   Not on file  Social History Narrative   Widowed 2/11 -2nd for him , 1st for her. 4 step sons. 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         Has living will and health care POA--changing her POA at this point   Requests DNR   Discussed MOST form---but would probably accept IV fluids/antibiotics, etc   Requests no feeding tube         Social Determinants of Health   Financial Resource Strain: Not on file  Food Insecurity: Not on file  Transportation Needs: Not on file  Physical Activity: Not on file  Stress: Not on file  Social Connections: Not on file    Tobacco Counseling Ready to quit: Not Answered Counseling given: Not Answered Tobacco comments: occassionally   Clinical Intake:  Pre-visit preparation completed: Yes  Pain : No/denies pain     BMI - recorded:  23 Nutritional Status: BMI of 19-24  Normal Diabetes: No  How often do you need to have someone help you when you read instructions, pamphlets, or other written materials from your doctor or pharmacy?: 1 - Never         Activities of Daily Living    01/13/2023   11:28 AM  In your present state of health, do  you have any difficulty performing the following activities:  Hearing? 0  Vision? 1  Difficulty concentrating or making decisions? 1  Walking or climbing stairs? 1  Comment does not climb stairs  Dressing or bathing? 0  Doing errands, shopping? 1  Comment does not drive  Preparing Food and eating ? N  Using the Toilet? N  In the past six months, have you accidently leaked urine? N  Do you have problems with loss of bowel control? N  Managing your Medications? N  Managing your Finances? N  Housekeeping or managing your Housekeeping? N    Patient Care Team: Earnestine Mealing, MD as PCP - General (Family Medicine) Mariah Milling Tollie Pizza, MD as PCP - Cardiology (Cardiology) Tonny Bollman, MD (Cardiology)  Indicate any recent Medical Services you may have received from other than [facility] providers in the past year (date may be approximate).     Assessment:   This is a routine wellness examination for Melina.  Hearing/Vision screen No results found.  Dietary issues and exercise activities discussed:     Goals Addressed   None    Depression Screen    01/13/2023   11:12 AM 01/05/2023    1:51 PM 07/30/2022    1:26 PM 07/30/2021    2:58 PM 07/30/2021    2:27 PM 02/21/2020    3:12 PM 01/01/2019    2:59 PM  PHQ 2/9 Scores  PHQ - 2 Score 0 0 0 1 0  1  PHQ- 9 Score     0    Exception Documentation      Medical reason     Fall Risk    01/13/2023   11:11 AM 01/05/2023    1:51 PM 07/30/2022    1:26 PM 07/30/2021    2:58 PM 07/30/2021    2:28 PM  Fall Risk   Falls in the past year? 1 1 1 1 1   Number falls in past yr: 0 0 1 0 0  Injury with Fall? 0 0 1 1 1   Risk for fall  due to :     History of fall(s);Impaired balance/gait  Follow up    Falls prevention discussed     MEDICARE RISK AT HOME:    TIMED UP AND GO:  Was the test performed?  No    Cognitive Function:    01/13/2023   11:19 AM 08/20/2015   11:35 AM  MMSE - Mini Mental State Exam  Not completed: --   Orientation to time 4 5  Orientation to Place 5 5  Registration 3 3  Attention/ Calculation 5 0  Recall 2 3  Language- name 2 objects 2 0  Language- repeat 1 1  Language- follow 3 step command 3 3  Language- read & follow direction 1 0  Write a sentence 1 0  Copy design 1 0  Total score 28 20        Immunizations Immunization History  Administered Date(s) Administered   Fluad Quad(high Dose 65+) 02/21/2020   Influenza Split 04/01/2011, 03/20/2012   Influenza Whole 04/02/2010   Influenza, High Dose Seasonal PF 03/11/2017, 04/07/2018, 03/26/2019   Influenza, Seasonal, Injecte, Preservative Fre 02/17/2016   Influenza,inj,Quad PF,6+ Mos 02/05/2013, 01/15/2014   Moderna Sars-Covid-2 Vaccination 05/29/2019, 06/26/2019, 03/31/2020, 06/17/2022   Pneumococcal Conjugate-13 01/15/2014   Pneumococcal Polysaccharide-23 05/18/2003, 09/16/2016   Td 05/18/2003, 01/15/2014   Zoster, Live 05/17/2008, 08/30/2008    TDAP status: Up to date  Flu Vaccine status: Due, Education has been provided regarding the  importance of this vaccine. Advised may receive this vaccine at local pharmacy or Health Dept. Aware to provide a copy of the vaccination record if obtained from local pharmacy or Health Dept. Verbalized acceptance and understanding.  Pneumococcal vaccine status: Up to date  Covid-19 vaccine status: Information provided on how to obtain vaccines.   Qualifies for Shingles Vaccine? Yes   Zostavax completed No   Shingrix Completed?: No.    Education has been provided regarding the importance of this vaccine. Patient has been advised to call insurance company to determine out of pocket expense  if they have not yet received this vaccine. Advised may also receive vaccine at local pharmacy or Health Dept. Verbalized acceptance and understanding.  Screening Tests Health Maintenance  Topic Date Due   Zoster Vaccines- Shingrix (1 of 2) 03/04/1953   COVID-19 Vaccine (5 - 2023-24 season) 08/12/2022   INFLUENZA VACCINE  12/16/2022   Medicare Annual Wellness (AWV)  01/13/2024   DTaP/Tdap/Td (3 - Tdap) 01/16/2024   Pneumonia Vaccine 25+ Years old  Completed   DEXA SCAN  Completed   HPV VACCINES  Aged Out    Health Maintenance  Health Maintenance Due  Topic Date Due   Zoster Vaccines- Shingrix (1 of 2) 03/04/1953   COVID-19 Vaccine (5 - 2023-24 season) 08/12/2022   INFLUENZA VACCINE  12/16/2022    Colorectal cancer screening: No longer required.   Mammogram status: No longer required due to age.  Bone Density status: Ordered today. Pt provided with contact info and advised to call to schedule appt.  Lung Cancer Screening: (Low Dose CT Chest recommended if Age 65-80 years, 20 pack-year currently smoking OR have quit w/in 15years.) does not qualify.   Lung Cancer Screening Referral: na  Additional Screening:  Hepatitis C Screening: does not qualify  Vision Screening: Recommended annual ophthalmology exams for early detection of glaucoma and other disorders of the eye. Is the patient up to date with their annual eye exam?  Yes  Who is the provider or what is the name of the office in which the patient attends annual eye exams? [facility] If pt is not established with a provider, would they like to be referred to a provider to establish care? No .   Dental Screening: Recommended annual dental exams for proper oral hygiene    Community Resource Referral / Chronic Care Management: CRR required this visit?  No   CCM required this visit?  No     Plan:     I have personally reviewed and noted the following in the patient's chart:   Medical and  social history Use of alcohol, tobacco or illicit drugs  Current medications and supplements including opioid prescriptions. Patient is not currently taking opioid prescriptions. Functional ability and status Nutritional status Physical activity Advanced directives List of other physicians Hospitalizations, surgeries, and ER visits in previous 12 months Vitals Screenings to include cognitive, depression, and falls Referrals and appointments  In addition, I have reviewed and discussed with patient certain preventive protocols, quality metrics, and best practice recommendations. A written personalized care plan for preventive services as well as general preventive health recommendations were provided to patient.     Sharon Seller, NP   01/13/2023

## 2023-01-13 NOTE — Patient Instructions (Signed)
  Tiffany Martinez , Thank you for taking time to come for your Medicare Wellness Visit. I appreciate your ongoing commitment to your health goals. Please review the following plan we discussed and let me know if I can assist you in the future.    This is a list of the screening recommended for you and due dates:  Health Maintenance  Topic Date Due   Zoster (Shingles) Vaccine (1 of 2) 03/04/1953   COVID-19 Vaccine (5 - 2023-24 season) 08/12/2022   Flu Shot  12/16/2022   Mammogram  05/17/2048*   Medicare Annual Wellness Visit  01/13/2024   DTaP/Tdap/Td vaccine (3 - Tdap) 01/16/2024   Pneumonia Vaccine  Completed   DEXA scan (bone density measurement)  Completed   HPV Vaccine  Aged Out  *Topic was postponed. The date shown is not the original due date.   To get shingles vaccine at your local pharmacy

## 2023-03-17 DIAGNOSIS — Z961 Presence of intraocular lens: Secondary | ICD-10-CM | POA: Diagnosis not present

## 2023-03-17 DIAGNOSIS — H26492 Other secondary cataract, left eye: Secondary | ICD-10-CM | POA: Diagnosis not present

## 2023-03-17 DIAGNOSIS — H538 Other visual disturbances: Secondary | ICD-10-CM | POA: Diagnosis not present

## 2023-03-17 DIAGNOSIS — H2511 Age-related nuclear cataract, right eye: Secondary | ICD-10-CM | POA: Diagnosis not present

## 2023-03-21 DIAGNOSIS — M25531 Pain in right wrist: Secondary | ICD-10-CM | POA: Diagnosis not present

## 2023-03-21 DIAGNOSIS — M25431 Effusion, right wrist: Secondary | ICD-10-CM | POA: Diagnosis not present

## 2023-03-30 ENCOUNTER — Other Ambulatory Visit: Payer: Medicare Other

## 2023-03-31 DIAGNOSIS — M67431 Ganglion, right wrist: Secondary | ICD-10-CM | POA: Diagnosis not present

## 2023-04-08 ENCOUNTER — Ambulatory Visit: Payer: Medicare Other | Admitting: Student

## 2023-04-08 ENCOUNTER — Encounter: Payer: Self-pay | Admitting: Student

## 2023-04-08 VITALS — BP 130/72 | HR 54 | Temp 97.8°F | Ht 64.0 in | Wt 138.0 lb

## 2023-04-08 DIAGNOSIS — N184 Chronic kidney disease, stage 4 (severe): Secondary | ICD-10-CM

## 2023-04-08 DIAGNOSIS — I480 Paroxysmal atrial fibrillation: Secondary | ICD-10-CM | POA: Diagnosis not present

## 2023-04-08 DIAGNOSIS — C9110 Chronic lymphocytic leukemia of B-cell type not having achieved remission: Secondary | ICD-10-CM | POA: Diagnosis not present

## 2023-04-08 DIAGNOSIS — N2581 Secondary hyperparathyroidism of renal origin: Secondary | ICD-10-CM | POA: Diagnosis not present

## 2023-04-08 DIAGNOSIS — K559 Vascular disorder of intestine, unspecified: Secondary | ICD-10-CM

## 2023-04-08 DIAGNOSIS — I10 Essential (primary) hypertension: Secondary | ICD-10-CM

## 2023-04-08 DIAGNOSIS — I5032 Chronic diastolic (congestive) heart failure: Secondary | ICD-10-CM

## 2023-04-08 NOTE — Patient Instructions (Addendum)
Keep a journal of when you have hives. Please bring this to your next appointment.  Please go to your pharmacy to get your Shingles Vaccine.  Fasting labs on 07/11/23

## 2023-04-08 NOTE — Progress Notes (Unsigned)
Location:   [facility]    [facility] of Service[facility]   [facility] [place] [facility]  Provider: Sydnee Cabal  Code Status: DNR Goals of Care:     04/08/2023    1:35 PM  Advanced Directives  Does Patient Have a Medical Advance Directive? Yes  Type of Estate agent of [place];Living will  Does patient want to make changes to medical advance directive? No - Patient declined  Copy of Healthcare Power of Attorney in Chart? No - copy requested     Chief Complaint  Patient presents with   Medical Management of Chronic Issues    3 month follow-up. Discuss need for shingrix and covid booster. [facility] verified. Patient has personal concerns. Loratadine is not helping with hives.     HPI: Patient is a 89 y.o. female seen today for medical management of chronic diseases.    She had a fall in the kitchen about 2 weeks ago. He went to the ortho for an evaluation. EMS came and took her to   She has fallen so many times she always takes her time gettting up. She had a lot of brusiing in her back as well.   She called physical therapy and that she had mantenance of the walker. She got a new one   BP well-controlled at this time.   Chronic mesenteric Ischemia - sometimes has diarrhea without warning - Since may she has only had explosive diarrhea twice and it's getting less and less. She has been more careful about what she eats.   Dementia - weight is stable. Eating well and regular. Denies nausea or vomiting.   Hives - Claritine helps with it. It seems to come on at any time. It seems to travel all over her torso and upper arms. Its every couple of months.  Past Medical History:  Diagnosis Date   AAA (abdominal aortic aneurysm) (HCC) 2012   3.9cm 2012; 2.8cm 12/2021   Anemia    Anxiety    CHF (congestive heart failure) (HCC)    LVEF 60-65% in 2023   Chronic venous insufficiency    CLL (chronic lymphocytic leukemia) (HCC) 2014   Stage 0--by flow cytometry   Hx of colonoscopy    Hyperlipidemia     Hypertension    Melanoma (HCC)    Right Arm   Migraine headache    3-4X/yr   PAD (peripheral artery disease) (HCC) 2012   severe R common iliac stenosis, ABI 0.73, mid L common iliac stenosis ABI 1.1   PAD (peripheral artery disease) (HCC)    Paroxysmal atrial fibrillation (HCC) 12/2021   Wears dentures    full upper and lower    Past Surgical History:  Procedure Laterality Date   ABDOMINAL AORTIC ANEURYSM REPAIR  02/16/12   [facility]--Dr Pattricia Boss   CARDIOVASCULAR STRESS TEST  2008   negative   CATARACT EXTRACTION W/PHACO Left 07/16/2019   Procedure: CATARACT EXTRACTION PHACO AND INTRAOCULAR LENS PLACEMENT (IOC) LEFT 8.26  00:46.6;  Surgeon: Nevada Crane, MD;  Location: [facility];  Service: Ophthalmology;  Laterality: Left;   DOBUTAMINE STRESS ECHO  11/11   negative   HIP ARTHROPLASTY Right 10/10/2019   Procedure: ARTHROPLASTY BIPOLAR HIP (HEMIARTHROPLASTY);  Surgeon: Christena Flake, MD;  Location: [facility];  Service: Orthopedics;  Laterality: Right;   KNEE ARTHROSCOPY  1998   right   MELANOMA EXCISION  11/09/10   wide excision right upper arm   SQUAMOUS CELL CARCINOMA EXCISION  02/2016   TONSILLECTOMY AND ADENOIDECTOMY  1960    Allergies  Allergen Reactions   Triamterene Hives    Outpatient Encounter Medications as of 04/08/2023  Medication Sig   amLODipine (NORVASC) 5 MG tablet Take 1 tablet (5 mg total) by mouth daily.   apixaban (ELIQUIS) 2.5 MG TABS tablet TAKE 1 TABLET BY MOUTH TWICE DAILY   loratadine (CLARITIN) 10 MG tablet Take 10 mg by mouth daily.   losartan (COZAAR) 100 MG tablet Take 1 tablet (100 mg total) by mouth daily.   metoprolol succinate (TOPROL-XL) 25 MG 24 hr tablet Take 0.5 tablets (12.5 mg total) by mouth daily.   Multiple Vitamin (MULTIVITAMIN ADULT PO) Take 1 tablet by mouth daily.   No facility-administered encounter medications on file as of 04/08/2023.    Review of Systems:  Review of Systems  Health Maintenance  Topic Date Due    Zoster Vaccines- Shingrix (1 of 2) 03/04/1953   COVID-19 Vaccine (5 - 2023-24 season) 01/16/2023   Medicare Annual Wellness (AWV)  01/13/2024   DTaP/Tdap/Td (3 - Tdap) 01/16/2024   Pneumonia Vaccine 54+ Years old  Completed   INFLUENZA VACCINE  Completed   DEXA SCAN  Completed   HPV VACCINES  Aged Out    Physical Exam: Vitals:   04/08/23 0801  BP: 130/72  Pulse: (!) 54  Temp: 97.8 F (36.6 C)  TempSrc: Temporal  SpO2: 99%  Weight: 138 lb (62.6 kg)  Height: 5\' 4"  (1.626 m)   Body mass index is 23.69 kg/m. Physical Exam Constitutional:      Appearance: Normal appearance.  Cardiovascular:     Rate and Rhythm: Normal rate and regular rhythm.  Pulmonary:     Effort: Pulmonary effort is normal.     Breath sounds: Wheezing present.  Abdominal:     General: Abdomen is flat.     Palpations: Abdomen is soft.  Skin:    General: Skin is warm.  Neurological:     General: No focal deficit present.     Mental Status: She is alert and oriented to person, place, and time.     Labs reviewed: Basic Metabolic Panel: Recent Labs    08/02/22 0000 12/27/22 0814  NA 139 140  K 3.9 4.2  CL 106 109  CO2 27* 24  GLUCOSE  --  109*  BUN 26* 22  CREATININE 1.8* 1.52*  CALCIUM 9.8 9.5   Liver Function Tests: Recent Labs    08/02/22 0000  AST 20  ALT 10  ALKPHOS 68  ALBUMIN 3.9   No results for input(s): "LIPASE", "AMYLASE" in the last 8760 hours. No results for input(s): "AMMONIA" in the last 8760 hours. CBC: Recent Labs    08/02/22 0000 12/27/22 0814  WBC 32.0 24.5*  NEUTROABS 3,968.00 3,455  HGB 11.1* 11.2*  HCT 33* 34.2*  MCV  --  105.6*  PLT 129* 77*   Lipid Panel: No results for input(s): "CHOL", "HDL", "LDLCALC", "TRIG", "CHOLHDL", "LDLDIRECT" in the last 8760 hours. Lab Results  Component Value Date   HGBA1C 5.4 08/02/2022    Procedures since last visit: No results found.  Assessment/Plan Essential hypertension, benign - Plan: CBC With  Differential/Platelet, Complete Metabolic Panel with eGFR, Lipid Panel  CLL (chronic lymphocytic leukemia) (HCC)  Chronic renal disease, stage IV (HCC)  Mesenteric ischemia (HCC)  Chronic diastolic heart failure (HCC)  Secondary hyperparathyroidism of renal origin (HCC)  Paroxysmal atrial fibrillation (HCC) Patient with history of hypertension well-controlled.  History of CLL most recent labs stable, repeat labs in 3 months.  History of chronic mesenteric ischemia, continue anticoagulation cholesterol medication.  Encourage regular diet.  History of chronic diastolic heart failure, he appears euvolemic on exam.  Discussed patient's wheezing and long-term history of smoking, declines inhalers at this time.  Patient states she manages with slow "box breathing".  Rate well-controlled no signs of bleeding at this time.  Labs/tests ordered:  * No order type specified * Next appt:  07/13/23

## 2023-04-09 ENCOUNTER — Encounter: Payer: Self-pay | Admitting: Student

## 2023-06-10 ENCOUNTER — Emergency Department
Admission: EM | Admit: 2023-06-10 | Discharge: 2023-06-10 | Disposition: A | Discharge status: Home / Self Care | Payer: Medicare Other | Source: Home / Self Care | Attending: Emergency Medicine | Admitting: Emergency Medicine

## 2023-06-10 ENCOUNTER — Other Ambulatory Visit: Payer: Self-pay

## 2023-06-10 ENCOUNTER — Emergency Department: Payer: Medicare Other

## 2023-06-10 DIAGNOSIS — R001 Bradycardia, unspecified: Secondary | ICD-10-CM | POA: Diagnosis not present

## 2023-06-10 DIAGNOSIS — Z856 Personal history of leukemia: Secondary | ICD-10-CM | POA: Insufficient documentation

## 2023-06-10 DIAGNOSIS — Z8679 Personal history of other diseases of the circulatory system: Secondary | ICD-10-CM | POA: Diagnosis not present

## 2023-06-10 DIAGNOSIS — G4700 Insomnia, unspecified: Secondary | ICD-10-CM | POA: Diagnosis present

## 2023-06-10 DIAGNOSIS — R4020 Unspecified coma: Secondary | ICD-10-CM | POA: Diagnosis not present

## 2023-06-10 DIAGNOSIS — R112 Nausea with vomiting, unspecified: Secondary | ICD-10-CM

## 2023-06-10 DIAGNOSIS — K551 Chronic vascular disorders of intestine: Secondary | ICD-10-CM | POA: Diagnosis present

## 2023-06-10 DIAGNOSIS — F03918 Unspecified dementia, unspecified severity, with other behavioral disturbance: Secondary | ICD-10-CM | POA: Diagnosis present

## 2023-06-10 DIAGNOSIS — R7881 Bacteremia: Secondary | ICD-10-CM | POA: Diagnosis not present

## 2023-06-10 DIAGNOSIS — Z66 Do not resuscitate: Secondary | ICD-10-CM | POA: Diagnosis present

## 2023-06-10 DIAGNOSIS — I5020 Unspecified systolic (congestive) heart failure: Secondary | ICD-10-CM | POA: Insufficient documentation

## 2023-06-10 DIAGNOSIS — Z20822 Contact with and (suspected) exposure to covid-19: Secondary | ICD-10-CM | POA: Insufficient documentation

## 2023-06-10 DIAGNOSIS — R42 Dizziness and giddiness: Secondary | ICD-10-CM | POA: Diagnosis not present

## 2023-06-10 DIAGNOSIS — K8020 Calculus of gallbladder without cholecystitis without obstruction: Secondary | ICD-10-CM | POA: Diagnosis not present

## 2023-06-10 DIAGNOSIS — Z9889 Other specified postprocedural states: Secondary | ICD-10-CM | POA: Diagnosis not present

## 2023-06-10 DIAGNOSIS — C9110 Chronic lymphocytic leukemia of B-cell type not having achieved remission: Secondary | ICD-10-CM | POA: Diagnosis not present

## 2023-06-10 DIAGNOSIS — R1084 Generalized abdominal pain: Secondary | ICD-10-CM | POA: Diagnosis not present

## 2023-06-10 DIAGNOSIS — A048 Other specified bacterial intestinal infections: Secondary | ICD-10-CM | POA: Diagnosis not present

## 2023-06-10 DIAGNOSIS — I739 Peripheral vascular disease, unspecified: Secondary | ICD-10-CM | POA: Diagnosis not present

## 2023-06-10 DIAGNOSIS — Z888 Allergy status to other drugs, medicaments and biological substances status: Secondary | ICD-10-CM | POA: Diagnosis not present

## 2023-06-10 DIAGNOSIS — K5730 Diverticulosis of large intestine without perforation or abscess without bleeding: Secondary | ICD-10-CM | POA: Diagnosis not present

## 2023-06-10 DIAGNOSIS — R739 Hyperglycemia, unspecified: Secondary | ICD-10-CM | POA: Diagnosis not present

## 2023-06-10 DIAGNOSIS — R55 Syncope and collapse: Secondary | ICD-10-CM | POA: Insufficient documentation

## 2023-06-10 DIAGNOSIS — I517 Cardiomegaly: Secondary | ICD-10-CM | POA: Diagnosis not present

## 2023-06-10 DIAGNOSIS — D849 Immunodeficiency, unspecified: Secondary | ICD-10-CM | POA: Diagnosis present

## 2023-06-10 DIAGNOSIS — Z7901 Long term (current) use of anticoagulants: Secondary | ICD-10-CM | POA: Diagnosis not present

## 2023-06-10 DIAGNOSIS — J449 Chronic obstructive pulmonary disease, unspecified: Secondary | ICD-10-CM | POA: Diagnosis present

## 2023-06-10 DIAGNOSIS — Z96641 Presence of right artificial hip joint: Secondary | ICD-10-CM | POA: Diagnosis not present

## 2023-06-10 DIAGNOSIS — Z85820 Personal history of malignant melanoma of skin: Secondary | ICD-10-CM | POA: Diagnosis not present

## 2023-06-10 DIAGNOSIS — I700 Atherosclerosis of aorta: Secondary | ICD-10-CM | POA: Diagnosis not present

## 2023-06-10 DIAGNOSIS — R0902 Hypoxemia: Secondary | ICD-10-CM | POA: Diagnosis not present

## 2023-06-10 DIAGNOSIS — F17210 Nicotine dependence, cigarettes, uncomplicated: Secondary | ICD-10-CM | POA: Diagnosis present

## 2023-06-10 DIAGNOSIS — I7143 Infrarenal abdominal aortic aneurysm, without rupture: Secondary | ICD-10-CM | POA: Diagnosis not present

## 2023-06-10 DIAGNOSIS — D631 Anemia in chronic kidney disease: Secondary | ICD-10-CM | POA: Diagnosis present

## 2023-06-10 DIAGNOSIS — I340 Nonrheumatic mitral (valve) insufficiency: Secondary | ICD-10-CM | POA: Diagnosis not present

## 2023-06-10 DIAGNOSIS — I480 Paroxysmal atrial fibrillation: Secondary | ICD-10-CM | POA: Diagnosis present

## 2023-06-10 DIAGNOSIS — N281 Cyst of kidney, acquired: Secondary | ICD-10-CM | POA: Diagnosis not present

## 2023-06-10 DIAGNOSIS — I080 Rheumatic disorders of both mitral and aortic valves: Secondary | ICD-10-CM | POA: Diagnosis present

## 2023-06-10 DIAGNOSIS — B9689 Other specified bacterial agents as the cause of diseases classified elsewhere: Secondary | ICD-10-CM | POA: Diagnosis not present

## 2023-06-10 DIAGNOSIS — N1832 Chronic kidney disease, stage 3b: Secondary | ICD-10-CM | POA: Diagnosis not present

## 2023-06-10 DIAGNOSIS — D72829 Elevated white blood cell count, unspecified: Secondary | ICD-10-CM | POA: Diagnosis not present

## 2023-06-10 DIAGNOSIS — E785 Hyperlipidemia, unspecified: Secondary | ICD-10-CM | POA: Diagnosis present

## 2023-06-10 DIAGNOSIS — I130 Hypertensive heart and chronic kidney disease with heart failure and stage 1 through stage 4 chronic kidney disease, or unspecified chronic kidney disease: Secondary | ICD-10-CM | POA: Diagnosis present

## 2023-06-10 DIAGNOSIS — I5042 Chronic combined systolic (congestive) and diastolic (congestive) heart failure: Secondary | ICD-10-CM | POA: Diagnosis present

## 2023-06-10 DIAGNOSIS — E872 Acidosis, unspecified: Secondary | ICD-10-CM | POA: Diagnosis present

## 2023-06-10 LAB — BASIC METABOLIC PANEL
Anion gap: 9 (ref 5–15)
BUN: 36 mg/dL — ABNORMAL HIGH (ref 8–23)
CO2: 22 mmol/L (ref 22–32)
Calcium: 9.4 mg/dL (ref 8.9–10.3)
Chloride: 108 mmol/L (ref 98–111)
Creatinine, Ser: 1.55 mg/dL — ABNORMAL HIGH (ref 0.44–1.00)
GFR, Estimated: 32 mL/min — ABNORMAL LOW (ref >60)
Glucose, Bld: 156 mg/dL — ABNORMAL HIGH (ref 70–99)
Potassium: 4.8 mmol/L (ref 3.5–5.1)
Sodium: 139 mmol/L (ref 135–145)

## 2023-06-10 LAB — URINALYSIS, W/ REFLEX TO CULTURE (INFECTION SUSPECTED)
Bacteria, UA: NONE SEEN
Bilirubin Urine: NEGATIVE
Glucose, UA: NEGATIVE mg/dL
Hgb urine dipstick: NEGATIVE
Ketones, ur: NEGATIVE mg/dL
Leukocytes,Ua: NEGATIVE
Nitrite: NEGATIVE
Protein, ur: >=300 mg/dL — AB
Specific Gravity, Urine: 1.013 (ref 1.005–1.030)
pH: 6 (ref 5.0–8.0)

## 2023-06-10 LAB — TROPONIN I (HIGH SENSITIVITY)
Troponin I (High Sensitivity): 11 ng/L (ref <18)
Troponin I (High Sensitivity): 13 ng/L (ref <18)

## 2023-06-10 LAB — HEPATIC FUNCTION PANEL
ALT: 15 U/L (ref 0–44)
AST: 29 U/L (ref 15–41)
Albumin: 3.6 g/dL (ref 3.5–5.0)
Alkaline Phosphatase: 61 U/L (ref 38–126)
Bilirubin, Direct: 0.2 mg/dL (ref 0.0–0.2)
Indirect Bilirubin: 0.8 mg/dL (ref 0.3–0.9)
Total Bilirubin: 1 mg/dL (ref 0.0–1.2)
Total Protein: 6.4 g/dL — ABNORMAL LOW (ref 6.5–8.1)

## 2023-06-10 LAB — CBC
HCT: 31.5 % — ABNORMAL LOW (ref 36.0–46.0)
Hemoglobin: 11.5 g/dL — ABNORMAL LOW (ref 12.0–15.0)
MCH: 41.2 pg — ABNORMAL HIGH (ref 26.0–34.0)
MCHC: 36.5 g/dL — ABNORMAL HIGH (ref 30.0–36.0)
MCV: 112.9 fL — ABNORMAL HIGH (ref 80.0–100.0)
Platelets: 119 10*3/uL — ABNORMAL LOW (ref 150–400)
RBC: 2.79 MIL/uL — ABNORMAL LOW (ref 3.87–5.11)
RDW: 14.6 % (ref 11.5–15.5)
WBC: 29.3 10*3/uL — ABNORMAL HIGH (ref 4.0–10.5)
nRBC: 0 % (ref 0.0–0.2)

## 2023-06-10 LAB — RESP PANEL BY RT-PCR (RSV, FLU A&B, COVID)  RVPGX2
Influenza A by PCR: NEGATIVE
Influenza B by PCR: NEGATIVE
Resp Syncytial Virus by PCR: NEGATIVE
SARS Coronavirus 2 by RT PCR: NEGATIVE

## 2023-06-10 LAB — LACTIC ACID, PLASMA: Lactic Acid, Venous: 0.6 mmol/L (ref 0.5–1.9)

## 2023-06-10 LAB — BRAIN NATRIURETIC PEPTIDE: B Natriuretic Peptide: 260.6 pg/mL — ABNORMAL HIGH (ref 0.0–100.0)

## 2023-06-10 MED ORDER — SODIUM CHLORIDE 0.9 % IV BOLUS
500.0000 mL | Freq: Once | INTRAVENOUS | Status: AC
  Administered 2023-06-10: 500 mL via INTRAVENOUS

## 2023-06-10 NOTE — ED Notes (Signed)
Pt ambulated to the bathroom at this time with the assist of a walker. Gait steady. No assistance needed. Pt able to provide urine sample that has been sent to lab.

## 2023-06-10 NOTE — ED Notes (Signed)
Full rainbow sent to lab.

## 2023-06-10 NOTE — ED Notes (Signed)
Spoke with security at [facility] to come pick up patient, stated they would call me at soon as they are here. Patient lives in independent living at [facility].

## 2023-06-10 NOTE — ED Notes (Addendum)
Stuck pt x2 attempts for blood draw. Pt moved hand both times after being told to keep arm/hand still. Pt stated that she was not aware of what was being done. This RN explained multiple times that a blood draw had been ordered by the MD.

## 2023-06-10 NOTE — Discharge Instructions (Signed)
Please return if you pass out again, if you have chest pain, shortness of breath, abdominal pain, fever, or if you have any additional concerns.  Please follow-up with your primary care doctor in 2 to 3 days to get reassessed.  Please call them tomorrow morning to set up an appointment.

## 2023-06-10 NOTE — ED Triage Notes (Signed)
Pt comes via EMS from hair salon with c/o loc.; pt was getting her hair done and passed out. Pt HR was in 40s. Pt vomiting and dry heaving. Pt unable to sit still for EKG and keeps saying she is going to pass out.   Pt taken to ED 1 at this time.

## 2023-06-10 NOTE — ED Notes (Signed)
Will call lab for blood draw after X-ray and CT scans

## 2023-06-10 NOTE — ED Notes (Signed)
Patient wheeled out to [place] with Security at [facility] to be taken home.

## 2023-06-10 NOTE — ED Notes (Signed)
Lab called for blood draw.

## 2023-06-10 NOTE — ED Provider Notes (Signed)
-----------------------------------------   2:31 PM on 06/10/2023 ----------------------------------------- EKG viewed and interpreted by myself shows a normal sinus rhythm at 53 bpm with a narrow QRS, normal axis, largely normal intervals besides borderline PR prolongation, no concerning ST changes.   Minna Antis, MD 06/10/23 1432

## 2023-06-10 NOTE — ED Provider Notes (Signed)
[facility] Provider Note    Event Date/Time   First MD Initiated Contact with Patient 06/10/23 1451     (approximate)   History   Bradycardia   HPI  Tiffany Martinez is a 89 y.o. female with history of chronic mesenteric ischemia, systolic heart failure, CLL, paroxysmal A-fib on anticoagulation, presenting with syncope.  Per EMS she was at her hair salon, was getting her hair done, passed out, found to have heart rates in the 40s.  Per patient she was feeling her usual self when she went to her dresser, states that she started feel nauseous, lightheaded, had some nausea vomiting.  She denies any fever at home, no infectious symptoms, no chest pain, back pain.  States that she had some mid abdominal cramping.  No pain right now. She denies any new leg swelling.  States she has been taking her medications.  On independent review, she was seen by her primary care doctor in August of last year, has CLL which is stable, has chronic CKD, diastolic heart failure, peripheral artery disease, dementia that is stable, uses a rolling walker.     Physical Exam   Triage Vital Signs: ED Triage Vitals  Encounter Vitals Group     BP 06/10/23 1414 (!) 197/61     Systolic BP Percentile --      Diastolic BP Percentile --      Pulse Rate 06/10/23 1414 (!) 57     Resp 06/10/23 1414 18     Temp 06/10/23 1416 98 F (36.7 C)     Temp src --      SpO2 06/10/23 1414 95 %     Weight --      Height --      Head Circumference --      Peak Flow --      Pain Score 06/10/23 1415 0     Pain Loc --      Pain Education --      Exclude from Growth Chart --     Most recent vital signs: Vitals:   06/10/23 1909 06/10/23 1922  BP: 94/73 (!) 190/58  Pulse: (!) 59 (!) 50  Resp: 13 (!) 22  Temp:    SpO2: 100% 92%     General: Awake, no distress.  CV:  Good peripheral perfusion.  Heart rate in the 60s Resp:  Normal effort.  Clear to auscultation Abd:  No distention.  Soft  nontender, no guarding or rebound Other:  Trace edema bilaterally, no unilateral calf swelling or tenderness.   ED Results / Procedures / Treatments   Labs (all labs ordered are listed, but only abnormal results are displayed) Labs Reviewed  BASIC METABOLIC PANEL - Abnormal; Notable for the following components:      Result Value   Glucose, Bld 156 (*)    BUN 36 (*)    Creatinine, Ser 1.55 (*)    GFR, Estimated 32 (*)    All other components within normal limits  CBC - Abnormal; Notable for the following components:   WBC 29.3 (*)    RBC 2.79 (*)    Hemoglobin 11.5 (*)    HCT 31.5 (*)    MCV 112.9 (*)    MCH 41.2 (*)    MCHC 36.5 (*)    Platelets 119 (*)    All other components within normal limits  HEPATIC FUNCTION PANEL - Abnormal; Notable for the following components:   Total Protein 6.4 (*)  All other components within normal limits  BRAIN NATRIURETIC PEPTIDE - Abnormal; Notable for the following components:   B Natriuretic Peptide 260.6 (*)    All other components within normal limits  URINALYSIS, W/ REFLEX TO CULTURE (INFECTION SUSPECTED) - Abnormal; Notable for the following components:   Color, Urine YELLOW (*)    APPearance HAZY (*)    Protein, ur >=300 (*)    All other components within normal limits  RESP PANEL BY RT-PCR (RSV, FLU A&B, COVID)  [id number]  CULTURE, BLOOD (ROUTINE X 2)  CULTURE, BLOOD (ROUTINE X 2)  LACTIC ACID, PLASMA  LACTIC ACID, PLASMA  TROPONIN I (HIGH SENSITIVITY)  TROPONIN I (HIGH SENSITIVITY)     EKG  Sinus rhythm, rate 53, normal QRS, normal QTc, no ischemic ST elevation, she has T wave flattening in 1, T wave inversion to V1,   RADIOLOGY Chest x-ray on my interpretation without obvious consolidation.   PROCEDURES:  Critical Care performed: No  Procedures   MEDICATIONS ORDERED IN ED: Medications  sodium chloride 0.9 % bolus 500 mL (0 mLs Intravenous Stopped 06/10/23 1735)     IMPRESSION / MDM / ASSESSMENT AND PLAN /  ED COURSE  I reviewed the triage vital signs and the nursing notes.                              Differential diagnosis includes, but is not limited to, electrolyte derangements, ACS, arrhythmia, infection, dehydration, considered PE but patient has no shortness of breath, she is anticoagulated, also consider progression of her mesenteric ischemia but again patient has no reports of tenderness or abdominal pain at this time, and she is anticoagulated.  Also consider heart block but no evidence of this on EKG.  Patient's presentation is most consistent with acute presentation with potential threat to life or bodily function.  Patient presenting with syncope in the hair salon, has some nausea vomiting and dry heaving.  No longer nauseous at this time.  On reexam not having any abdominal pain.  Ambulated to bathroom without any issues.  She has no symptoms.  Was monitored and her heart rate were in the high 50s to 60s.  Blood pressures were stable.  Independent review of labs, Trope x 2 is negative, lactate is normal, UA not consistent with UTI, electrolytes not severely deranged.  Shared decision making done with patient and she would like to go home.  Will follow-up with her primary care doctor in 2 to 3 days to reassess.  States that she has people at her living facility who can help her out, has good neighbors that she wants to stay in her own bed.  At this point no indication for admission at this time, believe she is safe for outpatient management.  Will discharge her strict precautions.  Clinical Course as of 06/10/23 1929  Fri Jun 10, 2023  1530 CBC(!) WBCs seems consistent compared to prior and status platelets.  H&H is stable.  Suspect this is likely due to her CLL. [TT]  1530 Basic metabolic panel(!) Electrolytes not severely deranged, creatinine is elevated but appears consistent compared to prior [TT]  1530 Hepatic function panel(!) LFTs are stable. [TT]  1925 Patient's oxygen sat went  down when the blood pressure Was on, otherwise is satting 100% on room air.  Suspect blood pressure in the 90s was apparent, repeat blood pressures was higher.  Otherwise patient is feeling good, states no lightheadedness, walked to  the bathroom with steady ambulation, no symptoms.  On check of her prior charts, she has heart rates of 59 in the past.  States that she lives at an independent living facility, has people who can come to help her, has good neighbors.  States that she also has a life alert.  Wants to go home. [TT]    Clinical Course User Index [TT] Jodie Echevaria Franchot Erichsen, MD     FINAL CLINICAL IMPRESSION(S) / ED DIAGNOSES   Final diagnoses:  Syncope, unspecified syncope type  Nausea and vomiting, unspecified vomiting type     Rx / DC Orders   ED Discharge Orders     None        Note:  This document was prepared using Dragon voice recognition software and may include unintentional dictation errors.    Claybon Jabs, MD 06/10/23 [id number]

## 2023-06-11 ENCOUNTER — Encounter: Payer: Self-pay | Admitting: Internal Medicine

## 2023-06-11 ENCOUNTER — Other Ambulatory Visit: Payer: Self-pay

## 2023-06-11 ENCOUNTER — Telehealth: Payer: Self-pay | Admitting: Emergency Medicine

## 2023-06-11 ENCOUNTER — Emergency Department: Payer: Medicare Other

## 2023-06-11 ENCOUNTER — Inpatient Hospital Stay
Admission: EM | Admit: 2023-06-11 | Discharge: 2023-06-16 | DRG: 872 | Disposition: A | Discharge status: Home / Self Care | Payer: Medicare Other | Attending: Hospitalist | Admitting: Hospitalist

## 2023-06-11 DIAGNOSIS — I130 Hypertensive heart and chronic kidney disease with heart failure and stage 1 through stage 4 chronic kidney disease, or unspecified chronic kidney disease: Secondary | ICD-10-CM | POA: Diagnosis present

## 2023-06-11 DIAGNOSIS — Z96649 Presence of unspecified artificial hip joint: Secondary | ICD-10-CM

## 2023-06-11 DIAGNOSIS — I480 Paroxysmal atrial fibrillation: Secondary | ICD-10-CM | POA: Diagnosis present

## 2023-06-11 DIAGNOSIS — R55 Syncope and collapse: Secondary | ICD-10-CM | POA: Diagnosis present

## 2023-06-11 DIAGNOSIS — N1832 Chronic kidney disease, stage 3b: Secondary | ICD-10-CM | POA: Diagnosis present

## 2023-06-11 DIAGNOSIS — G4700 Insomnia, unspecified: Secondary | ICD-10-CM | POA: Diagnosis present

## 2023-06-11 DIAGNOSIS — I10 Essential (primary) hypertension: Secondary | ICD-10-CM | POA: Diagnosis present

## 2023-06-11 DIAGNOSIS — B9689 Other specified bacterial agents as the cause of diseases classified elsewhere: Secondary | ICD-10-CM | POA: Diagnosis not present

## 2023-06-11 DIAGNOSIS — I517 Cardiomegaly: Secondary | ICD-10-CM | POA: Diagnosis not present

## 2023-06-11 DIAGNOSIS — N281 Cyst of kidney, acquired: Secondary | ICD-10-CM | POA: Diagnosis not present

## 2023-06-11 DIAGNOSIS — Z8679 Personal history of other diseases of the circulatory system: Secondary | ICD-10-CM | POA: Diagnosis not present

## 2023-06-11 DIAGNOSIS — I700 Atherosclerosis of aorta: Secondary | ICD-10-CM | POA: Diagnosis not present

## 2023-06-11 DIAGNOSIS — D849 Immunodeficiency, unspecified: Secondary | ICD-10-CM | POA: Diagnosis present

## 2023-06-11 DIAGNOSIS — R296 Repeated falls: Secondary | ICD-10-CM | POA: Diagnosis present

## 2023-06-11 DIAGNOSIS — K8020 Calculus of gallbladder without cholecystitis without obstruction: Secondary | ICD-10-CM | POA: Diagnosis not present

## 2023-06-11 DIAGNOSIS — Z888 Allergy status to other drugs, medicaments and biological substances status: Secondary | ICD-10-CM | POA: Diagnosis not present

## 2023-06-11 DIAGNOSIS — Z7901 Long term (current) use of anticoagulants: Secondary | ICD-10-CM

## 2023-06-11 DIAGNOSIS — E785 Hyperlipidemia, unspecified: Secondary | ICD-10-CM | POA: Diagnosis present

## 2023-06-11 DIAGNOSIS — R6883 Chills (without fever): Secondary | ICD-10-CM | POA: Diagnosis not present

## 2023-06-11 DIAGNOSIS — Z820 Family history of epilepsy and other diseases of the nervous system: Secondary | ICD-10-CM

## 2023-06-11 DIAGNOSIS — F03918 Unspecified dementia, unspecified severity, with other behavioral disturbance: Secondary | ICD-10-CM | POA: Diagnosis present

## 2023-06-11 DIAGNOSIS — K551 Chronic vascular disorders of intestine: Secondary | ICD-10-CM | POA: Diagnosis present

## 2023-06-11 DIAGNOSIS — D631 Anemia in chronic kidney disease: Secondary | ICD-10-CM | POA: Diagnosis present

## 2023-06-11 DIAGNOSIS — I7143 Infrarenal abdominal aortic aneurysm, without rupture: Secondary | ICD-10-CM | POA: Diagnosis not present

## 2023-06-11 DIAGNOSIS — F17210 Nicotine dependence, cigarettes, uncomplicated: Secondary | ICD-10-CM | POA: Diagnosis present

## 2023-06-11 DIAGNOSIS — Z66 Do not resuscitate: Secondary | ICD-10-CM | POA: Diagnosis present

## 2023-06-11 DIAGNOSIS — R7881 Bacteremia: Principal | ICD-10-CM | POA: Diagnosis present

## 2023-06-11 DIAGNOSIS — D72829 Elevated white blood cell count, unspecified: Secondary | ICD-10-CM | POA: Diagnosis not present

## 2023-06-11 DIAGNOSIS — E872 Acidosis, unspecified: Secondary | ICD-10-CM | POA: Diagnosis present

## 2023-06-11 DIAGNOSIS — D649 Anemia, unspecified: Secondary | ICD-10-CM | POA: Diagnosis present

## 2023-06-11 DIAGNOSIS — A048 Other specified bacterial intestinal infections: Secondary | ICD-10-CM | POA: Diagnosis not present

## 2023-06-11 DIAGNOSIS — I739 Peripheral vascular disease, unspecified: Secondary | ICD-10-CM | POA: Diagnosis present

## 2023-06-11 DIAGNOSIS — J449 Chronic obstructive pulmonary disease, unspecified: Secondary | ICD-10-CM | POA: Diagnosis present

## 2023-06-11 DIAGNOSIS — I5042 Chronic combined systolic (congestive) and diastolic (congestive) heart failure: Secondary | ICD-10-CM | POA: Diagnosis present

## 2023-06-11 DIAGNOSIS — C9110 Chronic lymphocytic leukemia of B-cell type not having achieved remission: Secondary | ICD-10-CM | POA: Diagnosis present

## 2023-06-11 DIAGNOSIS — I340 Nonrheumatic mitral (valve) insufficiency: Secondary | ICD-10-CM | POA: Diagnosis not present

## 2023-06-11 DIAGNOSIS — Z96641 Presence of right artificial hip joint: Secondary | ICD-10-CM | POA: Diagnosis present

## 2023-06-11 DIAGNOSIS — Z9889 Other specified postprocedural states: Secondary | ICD-10-CM | POA: Diagnosis not present

## 2023-06-11 DIAGNOSIS — Z85820 Personal history of malignant melanoma of skin: Secondary | ICD-10-CM | POA: Diagnosis not present

## 2023-06-11 DIAGNOSIS — K5730 Diverticulosis of large intestine without perforation or abscess without bleeding: Secondary | ICD-10-CM | POA: Diagnosis not present

## 2023-06-11 DIAGNOSIS — I38 Endocarditis, valve unspecified: Secondary | ICD-10-CM

## 2023-06-11 DIAGNOSIS — Z79899 Other long term (current) drug therapy: Secondary | ICD-10-CM

## 2023-06-11 DIAGNOSIS — I080 Rheumatic disorders of both mitral and aortic valves: Secondary | ICD-10-CM | POA: Diagnosis present

## 2023-06-11 DIAGNOSIS — Z800 Family history of malignant neoplasm of digestive organs: Secondary | ICD-10-CM

## 2023-06-11 LAB — RESP PANEL BY RT-PCR (RSV, FLU A&B, COVID)  RVPGX2
Influenza A by PCR: NEGATIVE
Influenza B by PCR: NEGATIVE
Resp Syncytial Virus by PCR: NEGATIVE
SARS Coronavirus 2 by RT PCR: NEGATIVE

## 2023-06-11 LAB — CBC WITH DIFFERENTIAL/PLATELET
Abs Immature Granulocytes: 0.07 10*3/uL (ref 0.00–0.07)
Basophils Absolute: 0.1 10*3/uL (ref 0.0–0.1)
Basophils Relative: 0 %
Eosinophils Absolute: 0.1 10*3/uL (ref 0.0–0.5)
Eosinophils Relative: 0 %
HCT: 34.1 % — ABNORMAL LOW (ref 36.0–46.0)
Hemoglobin: 11.4 g/dL — ABNORMAL LOW (ref 12.0–15.0)
Immature Granulocytes: 0 %
Lymphocytes Relative: 81 %
Lymphs Abs: 21.7 10*3/uL — ABNORMAL HIGH (ref 0.7–4.0)
MCH: 35.2 pg — ABNORMAL HIGH (ref 26.0–34.0)
MCHC: 33.4 g/dL (ref 30.0–36.0)
MCV: 105.2 fL — ABNORMAL HIGH (ref 80.0–100.0)
Monocytes Absolute: 0.9 10*3/uL (ref 0.1–1.0)
Monocytes Relative: 3 %
Neutro Abs: 4.2 10*3/uL (ref 1.7–7.7)
Neutrophils Relative %: 16 %
Platelets: 110 10*3/uL — ABNORMAL LOW (ref 150–400)
RBC: 3.24 MIL/uL — ABNORMAL LOW (ref 3.87–5.11)
RDW: 14.6 % (ref 11.5–15.5)
Smear Review: NORMAL
WBC: 27 10*3/uL — ABNORMAL HIGH (ref 4.0–10.5)
nRBC: 0.1 % (ref 0.0–0.2)

## 2023-06-11 LAB — LACTIC ACID, PLASMA
Lactic Acid, Venous: 0.7 mmol/L (ref 0.5–1.9)
Lactic Acid, Venous: 1 mmol/L (ref 0.5–1.9)

## 2023-06-11 LAB — PROCALCITONIN: Procalcitonin: <0.1 ng/mL

## 2023-06-11 LAB — COMPREHENSIVE METABOLIC PANEL
ALT: 15 U/L (ref 0–44)
AST: 27 U/L (ref 15–41)
Albumin: 4 g/dL (ref 3.5–5.0)
Alkaline Phosphatase: 68 U/L (ref 38–126)
Anion gap: 7 (ref 5–15)
BUN: 29 mg/dL — ABNORMAL HIGH (ref 8–23)
CO2: 23 mmol/L (ref 22–32)
Calcium: 9.8 mg/dL (ref 8.9–10.3)
Chloride: 108 mmol/L (ref 98–111)
Creatinine, Ser: 1.49 mg/dL — ABNORMAL HIGH (ref 0.44–1.00)
GFR, Estimated: 34 mL/min — ABNORMAL LOW (ref >60)
Glucose, Bld: 114 mg/dL — ABNORMAL HIGH (ref 70–99)
Potassium: 3.8 mmol/L (ref 3.5–5.1)
Sodium: 138 mmol/L (ref 135–145)
Total Bilirubin: 1 mg/dL (ref 0.0–1.2)
Total Protein: 6.7 g/dL (ref 6.5–8.1)

## 2023-06-11 LAB — PROTIME-INR
INR: 1.1 (ref 0.8–1.2)
Prothrombin Time: 14.7 s (ref 11.4–15.2)

## 2023-06-11 LAB — TROPONIN I (HIGH SENSITIVITY)
Troponin I (High Sensitivity): 16 ng/L (ref <18)
Troponin I (High Sensitivity): 15 ng/L (ref <18)

## 2023-06-11 MED ORDER — METOPROLOL TARTRATE 5 MG/5ML IV SOLN: 5.0000 mg | INTRAVENOUS | Status: DC | PRN

## 2023-06-11 MED ORDER — LACTATED RINGERS IV SOLN
150.0000 mL/h | INTRAVENOUS | Status: AC
  Administered 2023-06-11 – 2023-06-12 (×2): 150 mL/h via INTRAVENOUS

## 2023-06-11 MED ORDER — METRONIDAZOLE 500 MG/100ML IV SOLN: 500.0000 mg | Freq: Two times a day (BID) | INTRAVENOUS | Status: DC

## 2023-06-11 MED ORDER — ACETAMINOPHEN 325 MG PO TABS: 650.0000 mg | ORAL_TABLET | Freq: Four times a day (QID) | ORAL | Status: AC | PRN

## 2023-06-11 MED ORDER — LACTATED RINGERS IV BOLUS
1000.0000 mL | Freq: Once | INTRAVENOUS | Status: AC
  Administered 2023-06-11: 1000 mL via INTRAVENOUS

## 2023-06-11 MED ORDER — IPRATROPIUM-ALBUTEROL 0.5-2.5 (3) MG/3ML IN SOLN: 3.0000 mL | Freq: Four times a day (QID) | RESPIRATORY_TRACT | Status: AC | PRN

## 2023-06-11 MED ORDER — SENNOSIDES-DOCUSATE SODIUM 8.6-50 MG PO TABS: 1.0000 | ORAL_TABLET | Freq: Every evening | ORAL | Status: DC | PRN

## 2023-06-11 MED ORDER — SODIUM CHLORIDE 0.9 % IV SOLN
2.0000 g | Freq: Once | INTRAVENOUS | Status: AC
  Administered 2023-06-11: 2 g via INTRAVENOUS
  Filled 2023-06-11: qty 12.5

## 2023-06-11 MED ORDER — APIXABAN 2.5 MG PO TABS
2.5000 mg | ORAL_TABLET | Freq: Two times a day (BID) | ORAL | Status: DC
  Administered 2023-06-11 – 2023-06-14 (×6): 2.5 mg via ORAL
  Filled 2023-06-11 (×7): qty 1

## 2023-06-11 MED ORDER — HYDRALAZINE HCL 20 MG/ML IJ SOLN
5.0000 mg | Freq: Four times a day (QID) | INTRAMUSCULAR | Status: DC | PRN
  Administered 2023-06-12: 5 mg via INTRAVENOUS
  Filled 2023-06-11: qty 1

## 2023-06-11 MED ORDER — ACETAMINOPHEN 650 MG RE SUPP: 650.0000 mg | Freq: Four times a day (QID) | RECTAL | Status: AC | PRN

## 2023-06-11 MED ORDER — SODIUM CHLORIDE 0.9 % IV BOLUS (SEPSIS): 1000.0000 mL | Freq: Once | INTRAVENOUS | Status: DC

## 2023-06-11 MED ORDER — VANCOMYCIN HCL IN DEXTROSE 1-5 GM/200ML-% IV SOLN: 1000.0000 mg | Freq: Once | INTRAVENOUS | Status: DC

## 2023-06-11 MED ORDER — LACTATED RINGERS IV SOLN: INTRAVENOUS | Status: DC

## 2023-06-11 MED ORDER — ONDANSETRON HCL 4 MG PO TABS: 4.0000 mg | ORAL_TABLET | Freq: Four times a day (QID) | ORAL | Status: AC | PRN

## 2023-06-11 MED ORDER — VANCOMYCIN HCL 1500 MG/300ML IV SOLN
1500.0000 mg | Freq: Once | INTRAVENOUS | Status: AC
  Administered 2023-06-11: 1500 mg via INTRAVENOUS
  Filled 2023-06-11: qty 300

## 2023-06-11 MED ORDER — SODIUM CHLORIDE 0.9 % IV BOLUS (SEPSIS)
1000.0000 mL | Freq: Once | INTRAVENOUS | Status: AC
  Administered 2023-06-11: 1000 mL via INTRAVENOUS

## 2023-06-11 MED ORDER — ONDANSETRON HCL 4 MG/2ML IJ SOLN
4.0000 mg | Freq: Four times a day (QID) | INTRAMUSCULAR | Status: AC | PRN
  Administered 2023-06-12 – 2023-06-13 (×2): 4 mg via INTRAVENOUS
  Filled 2023-06-11 (×2): qty 2

## 2023-06-11 MED ORDER — VANCOMYCIN HCL IN DEXTROSE 1-5 GM/200ML-% IV SOLN: 1000.0000 mg | INTRAVENOUS | Status: DC

## 2023-06-11 MED ORDER — METRONIDAZOLE 500 MG/100ML IV SOLN
500.0000 mg | Freq: Once | INTRAVENOUS | Status: DC
  Filled 2023-06-11: qty 100

## 2023-06-11 MED ORDER — SODIUM CHLORIDE 0.9 % IV SOLN: 2.0000 g | INTRAVENOUS | Status: DC

## 2023-06-11 NOTE — H&P (Addendum)
History and Physical   Tiffany Martinez UEA:[id number] DOB: 1934-05-14 DOA: 06/11/2023  PCP: Earnestine Mealing, MD  Outpatient Specialists: Dr. Mariah Milling, [facility] cardiology Patient coming from: home  I have personally briefly reviewed patient's old medical records in [facility] EMR.  Chief Concern: bacteremia  HPI: [name] is a 89 year old female with history of hypertension, CLL, atrial fibrillation on Eliquis, CKD stage IIIb, chronic mesenteric ischemia, PAD, anxiety, who presents to the emergency department for chief concerns of called back due to abnormal blood cultures.  Vitals in the ED showed T 98, respiration rate of 16, heart rate of 129 and EDP states that at bedside it is in the 60s, blood pressure 170/57, SpO2 of 98% on room air.  Serum sodium is 138, potassium 3.8, chloride 108, bicarb 23, BUN of 29, serum creatinine 1.49, EGFR 34, nonfasting blood glucose 114, WBC 27, hemoglobin 11.4, platelets of 110.  COVID/influenza A/influenza B/RSV PCR were negative.  UA was negative for leukocytes and nitrates.  UA was positive for greater than or equal to 300 protein.  Lactic acid was 0.1.  High sensitive troponin 16.  ED treatment: LR 1 L bolus, LR infusion at 150 mL/h, cefepime, vancomycin, Flagyl 500 mg IV one-time dose was ordered ---------------------------------- At bedtime, patient able to tell me her first and last name, age, location, current calendar year.  Patient does not appear to be in acute distress.    She reports that on 06/10/2023, patient had a late breakfast eating toast, normally, and had up coffee.  She then went to her appointment for her hair.  She was sitting in the hair stylist chair, when she told the hairstylist that she did not feel well.  The next thing she knew she was told that she had passed out.  She had vomited several times, stating that it was her breakfast of toast, normally, coffee.  She reports this is never happened before.  She presented  to the emergency department and was ultimately discharged from the ED.  Today she got called back because the blood cultures were positive for infection.  She reports chronic weakness/easily fatigued from her chronic CLL.  Social history: She is at [facility] independent living.  She denies tobacco, EtOH, recreational drug use.  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  ROS: Constitutional: no weight change, no fever ENT/Mouth: no sore throat, no rhinorrhea Eyes: no eye pain, no vision changes Cardiovascular: no chest pain, no dyspnea,  no edema, no palpitations Respiratory: + cough, no sputum, no wheezing Gastrointestinal: no nausea, no vomiting, no diarrhea, no constipation Genitourinary: no urinary incontinence, no dysuria, no hematuria Musculoskeletal: no arthralgias, no myalgias Skin: no skin lesions, no pruritus, Neuro: + weakness, no loss of consciousness, no syncope Psych: no anxiety, no depression, no decrease appetite Heme/Lymph: no bruising, no bleeding  ED Course: Discussed with EDP, patient requiring hospitalization for chief concerns of bacteremia.  Assessment/Plan  Principal Problem:   Gram-positive bacteremia Active Problems:   Hyperlipidemia   Chronic anemia   Essential hypertension, benign   Peripheral arterial disease (HCC)   Aortic atherosclerosis (HCC)   CLL (chronic lymphocytic leukemia) (HCC)   COPD (chronic obstructive pulmonary disease) (HCC)   Insomnia   Paroxysmal atrial fibrillation (HCC)   Status post hip hemiarthroplasty   Bacteremia   CKD stage 3b, GFR 30-44 ml/min (HCC)   Assessment and Plan:  * Gram-positive bacteremia In 1 set of blood cultures however in setting of  syncope on 1/24, agree with admission for monitoring and IV antibiotics Complete echo ordered Cefepime and vancomycin ordered on admission Discontinue metronidazole Blood cultures ordered in the ED on day of admission, 1/25, x 2 are in  process  CKD stage 3b, GFR 30-44 ml/min (HCC) At baseline  Paroxysmal atrial fibrillation (HCC) Metoprolol 5 mg IV every 4 hours as needed for heart rate greater than 120, 3 doses ordered on admission Eliquis 2.5 mg p.o. twice daily resumed on admission  CLL (chronic lymphocytic leukemia) (HCC) At baseline leukocytosis Recheck CBC in the a.m.  Essential hypertension, benign Pending med reconciliation: Home amlodipine 5 mg daily, losartan 100 mg daily, metoprolol succinate 12.5 mg p.o. daily not resumed on admission as patient is presenting for bacteremia AM team to resume home antihypertensive medications when the benefits outweigh the risk Hydralazine 5 mg IV every 6 hours as needed for SBP greater 170, 5 days ordered  Chart reviewed.   DVT prophylaxis: Apixaban 2.5 mg p.o. twice daily Code Status: DNR/DNI, patient states she has a MOST form on her refrigerator at home Diet: Heart healthy Family Communication: No Disposition Plan: Pending clinical course, guarded prognosis Consults called: Pending clinical course, a.m. team to consult infectious disease as appropriate Admission status: Telemetry cardiac, inpatient  Past Medical History:  Diagnosis Date   AAA (abdominal aortic aneurysm) (HCC) 2012   3.9cm 2012; 2.8cm 12/2021   Anemia    Anxiety    CHF (congestive heart failure) (HCC)    LVEF 60-65% in 2023   Chronic venous insufficiency    CLL (chronic lymphocytic leukemia) (HCC) 2014   Stage 0--by flow cytometry   Hx of colonoscopy    Hyperlipidemia    Hypertension    Melanoma (HCC)    Right Arm   Migraine headache    3-4X/yr   PAD (peripheral artery disease) (HCC) 2012   severe R common iliac stenosis, ABI 0.73, mid L common iliac stenosis ABI 1.1   PAD (peripheral artery disease) (HCC)    Paroxysmal atrial fibrillation (HCC) 12/2021   Wears dentures    full upper and lower   Past Surgical History:  Procedure Laterality Date   ABDOMINAL AORTIC ANEURYSM REPAIR   02/16/12   [facility]--Dr Pattricia Boss   CARDIOVASCULAR STRESS TEST  2008   negative   CATARACT EXTRACTION W/PHACO Left 07/16/2019   Procedure: CATARACT EXTRACTION PHACO AND INTRAOCULAR LENS PLACEMENT (IOC) LEFT 8.26  00:46.6;  Surgeon: Nevada Crane, MD;  Location: [facility];  Service: Ophthalmology;  Laterality: Left;   DOBUTAMINE STRESS ECHO  11/11   negative   HIP ARTHROPLASTY Right 10/10/2019   Procedure: ARTHROPLASTY BIPOLAR HIP (HEMIARTHROPLASTY);  Surgeon: Christena Flake, MD;  Location: [facility] ORS;  Service: Orthopedics;  Laterality: Right;   KNEE ARTHROSCOPY  1998   right   MELANOMA EXCISION  11/09/10   wide excision right upper arm   SQUAMOUS CELL CARCINOMA EXCISION  02/2016   TONSILLECTOMY AND ADENOIDECTOMY  1960   Social History:  reports that she has been smoking cigarettes. She has never been exposed to tobacco smoke. She has never used smokeless tobacco. She reports current alcohol use of about 7.0 standard drinks of alcohol per week. She reports that she does not use drugs.  Allergies  Allergen Reactions   Triamterene Hives   Family History  Problem Relation Age of Onset   Alzheimer's disease Mother    Pancreatic cancer Father    Parkinson's disease Brother    Coronary artery disease Neg Hx  Diabetes Neg Hx    Cancer Neg Hx        breast or colon   Family history: Family history reviewed and not pertinent.  Prior to Admission medications   Medication Sig Start Date End Date Taking? Authorizing Provider  amLODipine (NORVASC) 5 MG tablet Take 1 tablet (5 mg total) by mouth daily. 10/01/22   Earnestine Mealing, MD  apixaban (ELIQUIS) 2.5 MG TABS tablet TAKE 1 TABLET BY MOUTH TWICE DAILY 07/07/22   Antonieta Iba, MD  loratadine (CLARITIN) 10 MG tablet Take 10 mg by mouth daily.    [provider]  losartan (COZAAR) 100 MG tablet Take 1 tablet (100 mg total) by mouth daily. 10/01/22   Earnestine Mealing, MD  metoprolol succinate (TOPROL-XL) 25 MG 24 hr tablet  Take 0.5 tablets (12.5 mg total) by mouth daily. 10/01/22   Earnestine Mealing, MD  Multiple Vitamin (MULTIVITAMIN ADULT PO) Take 1 tablet by mouth daily.    [provider]   Physical Exam: Vitals:   06/11/23 1451 06/11/23 1454 06/11/23 1800  BP: (!) 170/57  (!) 191/55  Pulse: (!) 129  (!) 58  Resp: 16  15  Temp: 98 F (36.7 C)    TempSrc: Oral    SpO2: 98%  100%  Weight:  61.7 kg   Height:  5\' 1"  (1.549 m)    Constitutional: appears age-appropriate, NAD, calm Eyes: PERRL, lids and conjunctivae normal ENMT: Mucous membranes are moist. Posterior pharynx clear of any exudate or lesions. Age-appropriate dentition. Hearing appropriate Neck: normal, supple, no masses, no thyromegaly Respiratory: clear to auscultation bilaterally, no wheezing, no crackles. Normal respiratory effort. No accessory muscle use.  Cardiovascular: Regular rate and rhythm, no murmurs / rubs / gallops. No extremity edema. 2+ pedal pulses. No carotid bruits.  Abdomen: no tenderness, no masses palpated, no hepatosplenomegaly. Bowel sounds positive.  Musculoskeletal: no clubbing / cyanosis. No joint deformity upper and lower extremities. Good ROM, no contractures, no atrophy. Normal muscle tone.  Skin: no rashes, lesions, ulcers. No induration Neurologic: Sensation intact. Strength 5/5 in all 4.  Psychiatric: Normal judgment and insight. Alert and oriented x 3. Normal mood.   EKG: independently reviewed, showing sinus rhythm 62, QTc 437  Chest x-ray on Admission: I personally reviewed and I agree with radiologist reading as below.  DG Chest 2 View Result Date: 06/11/2023 CLINICAL DATA:  Leukocytosis and bacteremia. EXAM: CHEST - 2 VIEW COMPARISON:  None Available. FINDINGS: Stable mild cardiomegaly. Aortic atherosclerotic calcification incidentally noted. Both lungs are clear. The visualized skeletal structures are unremarkable. IMPRESSION: Mild cardiomegaly.  No active lung disease. Electronically Signed   By:  Danae Orleans M.D.   On: 06/11/2023 15:56   Labs on Admission: I have personally reviewed following labs  CBC: Recent Labs  Lab 06/10/23 1418 06/11/23 1500  WBC 29.3* 27.0*  NEUTROABS  --  4.2  HGB 11.5* 11.4*  HCT 31.5* 34.1*  MCV 112.9* 105.2*  PLT 119* 110*   Basic Metabolic Panel: Recent Labs  Lab 06/10/23 1418 06/11/23 1500  NA 139 138  K 4.8 3.8  CL 108 108  CO2 22 23  GLUCOSE 156* 114*  BUN 36* 29*  CREATININE 1.55* 1.49*  CALCIUM 9.4 9.8   GFR: Estimated Creatinine Clearance: 22.4 mL/min (A) (by C-G formula based on SCr of 1.49 mg/dL (H)).  Liver Function Tests: Recent Labs  Lab 06/10/23 1427 06/11/23 1500  AST 29 27  ALT 15 15  ALKPHOS 61 68  BILITOT 1.0  1.0  PROT 6.4* 6.7  ALBUMIN 3.6 4.0   Coagulation Profile: Recent Labs  Lab 06/11/23 1500  INR 1.1   Urine analysis:    Component Value Date/Time   COLORURINE YELLOW (A) 06/10/2023 1802   APPEARANCEUR HAZY (A) 06/10/2023 1802   APPEARANCEUR Clear 08/21/2014 1141   LABSPEC 1.013 06/10/2023 1802   LABSPEC 1.017 08/21/2014 1141   PHURINE 6.0 06/10/2023 1802   GLUCOSEU NEGATIVE 06/10/2023 1802   GLUCOSEU Negative 08/21/2014 1141   HGBUR NEGATIVE 06/10/2023 1802   BILIRUBINUR NEGATIVE 06/10/2023 1802   BILIRUBINUR Negative 08/21/2014 1141   KETONESUR NEGATIVE 06/10/2023 1802   PROTEINUR >=300 (A) 06/10/2023 1802   UROBILINOGEN negative 01/26/2012 1215   NITRITE NEGATIVE 06/10/2023 1802   LEUKOCYTESUR NEGATIVE 06/10/2023 1802   LEUKOCYTESUR Negative 08/21/2014 1141   This document was prepared using Dragon Voice Recognition software and may include unintentional dictation errors.  Dr. Sedalia Muta [facility]  If 7PM-7AM, please contact overnight-coverage provider If 7AM-7PM, please contact day attending provider [email or web address]  06/11/2023, 7:29 PM

## 2023-06-11 NOTE — Hospital Course (Signed)
[name] is a 89 year old female with history of hypertension, CLL, atrial fibrillation on Eliquis, CKD stage IIIb, chronic mesenteric ischemia, PAD, anxiety, who presents to the emergency department for chief concerns of called back due to abnormal blood cultures. Blood culture on 06/06/2023 showed gram-positive rods in 1 bottle, repeated culture on 1/25 still positive for same bacteria.  Patient was identified as a Clostridium Ramosum. Patient is seen by ID, placed on Unasyn. Patient also had history of aortic artery graft, CT angiogram of the chest did not show any evidence of infection.  TEE is scheduled for 1/29.

## 2023-06-11 NOTE — Consult Note (Signed)
Pharmacy Antibiotic Note  Tiffany Martinez is a 89 y.o. female admitted on 06/11/2023 with sepsis.  Pharmacy has been consulted for vancomycin and cefepime dosing.  Afebrile, WBC = 27  Plan: Give vancomycin 1500 mg IV x 1, then start vancomycin 1000 mg IV every 48 hours Estimated AUC 534, Cmin 13.2 IBW, Scr 1.49, Vd 0.72 Vancomycin levels at steady state or as clinically indicated Cefepime 2 grams IV every 24 hours Follow renal function and cultures for adjustments  Height: 5\' 1"  (154.9 cm) Weight: 61.7 kg (136 lb) IBW/kg (Calculated) : 47.8  Temp (24hrs), Avg:98.3 F (36.8 C), Min:98 F (36.7 C), Max:98.5 F (36.9 C)  Recent Labs  Lab 06/10/23 1418 06/10/23 1639 06/11/23 1500  WBC 29.3*  --  27.0*  CREATININE 1.55*  --  1.49*  LATICACIDVEN  --  0.6 0.7    Estimated Creatinine Clearance: 22.4 mL/min (A) (by C-G formula based on SCr of 1.49 mg/dL (H)).    Allergies  Allergen Reactions   Triamterene Hives    Antimicrobials this admission: vancomycin 1/25 >>  cefepime 1/25 >>    Dose adjustments this admission: N/A  Microbiology results: 1/25 BCx: pending 1/25 UCx: pending   Thank you for allowing pharmacy to be a part of this patient's care.  Barrie Folk, PharmD 06/11/2023 6:35 PM

## 2023-06-11 NOTE — Assessment & Plan Note (Signed)
At baseline

## 2023-06-11 NOTE — Assessment & Plan Note (Signed)
At baseline leukocytosis Recheck CBC in the a.m.

## 2023-06-11 NOTE — ED Triage Notes (Signed)
Pt reports she was seen in ER yesterday (1/24) and discharged home after having a syncopal episode. Pt reports he received a call today to inform her she needed to return due to some abnormal lab work, Pt reports feeling fatigued, tired, and urinary frequency. Pt talks in complete sentences no respiratory distress noted. Pt has productive cough.

## 2023-06-11 NOTE — ED Provider Notes (Signed)
[facility] Provider Note    Event Date/Time   First MD Initiated Contact with Patient 06/11/23 1718     (approximate)   History   Blood Infection   HPI  Tiffany Martinez is a 89 y.o. female   chronic mesenteric ischemia, systolic heart failure, CLL, paroxysmal A-fib on anticoagulation, presenting with syncope yesterday now coming back in with concern for positive blood cultures.  Patient had a white count of 29,000 yesterday had blood cultures drawn.   Patient had positive rods  Patient states that she was feeling at her normal self and then she had an episode of passing out, vomiting.  Denies any diarrhea.  Did not hit her head.  She reports feeling better right now just feels some overall weakness.  She denies being on any medications for CLL.   Physical Exam   Triage Vital Signs: ED Triage Vitals  Encounter Vitals Group     BP 06/11/23 1451 (!) 170/57     Systolic BP Percentile --      Diastolic BP Percentile --      Pulse Rate 06/11/23 1451 (!) 129     Resp 06/11/23 1451 16     Temp 06/11/23 1451 98 F (36.7 C)     Temp Source 06/11/23 1451 Oral     SpO2 06/11/23 1451 98 %     Weight 06/11/23 1454 136 lb (61.7 kg)     Height 06/11/23 1454 5\' 1"  (1.549 m)     Head Circumference --      Peak Flow --      Pain Score 06/11/23 1454 0     Pain Loc --      Pain Education --      Exclude from Growth Chart --     Most recent vital signs: Vitals:   06/11/23 1451  BP: (!) 170/57  Pulse: (!) 129  Resp: 16  Temp: 98 F (36.7 C)  SpO2: 98%     General: Awake, no distress.  CV:  Good peripheral perfusion.  Resp:  Normal effort.  Abd:  No distention.  Soft and nontender Other:     ED Results / Procedures / Treatments   Labs (all labs ordered are listed, but only abnormal results are displayed) Labs Reviewed  COMPREHENSIVE METABOLIC PANEL - Abnormal; Notable for the following components:      Result Value   Glucose, Bld 114 (*)     BUN 29 (*)    Creatinine, Ser 1.49 (*)    GFR, Estimated 34 (*)    All other components within normal limits  CBC WITH DIFFERENTIAL/PLATELET - Abnormal; Notable for the following components:   WBC 27.0 (*)    RBC 3.24 (*)    Hemoglobin 11.4 (*)    HCT 34.1 (*)    MCV 105.2 (*)    MCH 35.2 (*)    Platelets 110 (*)    Lymphs Abs 21.7 (*)    All other components within normal limits  RESP PANEL BY RT-PCR (RSV, FLU A&B, COVID)  RVPGX2  CULTURE, BLOOD (ROUTINE X 2)  CULTURE, BLOOD (ROUTINE X 2)  PROTIME-INR  LACTIC ACID, PLASMA  LACTIC ACID, PLASMA  URINALYSIS, W/ REFLEX TO CULTURE (INFECTION SUSPECTED)  TROPONIN I (HIGH SENSITIVITY)  TROPONIN I (HIGH SENSITIVITY)     EKG  My interpretation of EKG:  Sinus rate of 62 without any ST elevation or T wave inversions other than aVL, normal intervals,  RADIOLOGY I have reviewed the  xray personally and interpreted patient has mild cardiomegaly  PROCEDURES:  Critical Care performed: Yes, see critical care procedure note(s)  .Critical Care  Performed by: Concha Se, MD Authorized by: Concha Se, MD   Critical care provider statement:    Critical care time (minutes):  30   Critical care was necessary to treat or prevent imminent or life-threatening deterioration of the following conditions:  Sepsis   Critical care was time spent personally by me on the following activities:  Development of treatment plan with patient or surrogate, discussions with consultants, evaluation of patient's response to treatment, examination of patient, ordering and review of laboratory studies, ordering and review of radiographic studies, ordering and performing treatments and interventions, pulse oximetry, re-evaluation of patient's condition and review of old charts    MEDICATIONS ORDERED IN ED: Medications  lactated ringers infusion (has no administration in time range)  sodium chloride 0.9 % bolus 1,000 mL (has no administration in time  range)  ceFEPIme (MAXIPIME) 2 g in sodium chloride 0.9 % 100 mL IVPB (has no administration in time range)  metroNIDAZOLE (FLAGYL) IVPB 500 mg (has no administration in time range)  vancomycin (VANCOCIN) IVPB 1000 mg/200 mL premix (has no administration in time range)     IMPRESSION / MDM / ASSESSMENT AND PLAN / ED COURSE  I reviewed the triage vital signs and the nursing notes.   Patient's presentation is most consistent with acute presentation with potential threat to life or bodily function.   Patient comes in with concerns for positive blood culture with gram-negative rods.  Patient well-appearing given elevated white count and elevated heart rate meet sepsis criteria therefore sepsis alert called.  When I was in the room patient's heart rate was actually in the 60s so not sure if she is having some type of arrhythmia.  Will keep patient on the cardiac monitor.  Her initial troponin is negative.  Urine without evidence of UTI abdomen soft and nontender.  She overall feels well but does report some vague weakness.  We discussed admission for possible blood culture bacteremia versus contamination as well as the syncope.  Patient expressed understanding felt comfortable with that plan.   Patient given dose of broad-spectrum antibiotics.  Did only 1 L of fluid given she has a history of CHF is not hypotensive and not tachycardic at this time.   The patient is on the cardiac monitor to evaluate for evidence of arrhythmia and/or significant heart rate changes.      FINAL CLINICAL IMPRESSION(S) / ED DIAGNOSES   Final diagnoses:  Bacteremia  Syncope, unspecified syncope type     Rx / DC Orders   ED Discharge Orders     None        Note:  This document was prepared using Dragon voice recognition software and may include unintentional dictation errors.   Concha Se, MD 06/11/23 [place]

## 2023-06-11 NOTE — Assessment & Plan Note (Addendum)
In 1 set of blood cultures however in setting of syncope on 1/24, agree with admission for monitoring and IV antibiotics Complete echo ordered Cefepime and vancomycin ordered on admission Discontinue metronidazole Blood cultures ordered in the ED on day of admission, 1/25, x 2 are in process

## 2023-06-11 NOTE — Consult Note (Signed)
CODE SEPSIS - PHARMACY COMMUNICATION  **Broad Spectrum Antibiotics should be administered within 1 hour of Sepsis diagnosis**  Time Code Sepsis Called/Page Received: 1740  Antibiotics Ordered: cefepime, vancomycin, and Flagyl  Time of 1st antibiotic administration: 1806  Additional action taken by pharmacy: N/A   Barrie Folk ,PharmD Clinical Pharmacist  06/11/2023  5:46 PM

## 2023-06-11 NOTE — Assessment & Plan Note (Addendum)
Metoprolol 5 mg IV every 4 hours as needed for heart rate greater than 120, 3 doses ordered on admission Eliquis 2.5 mg p.o. twice daily resumed on admission

## 2023-06-11 NOTE — Assessment & Plan Note (Addendum)
Pending med reconciliation: Home amlodipine 5 mg daily, losartan 100 mg daily, metoprolol succinate 12.5 mg p.o. daily not resumed on admission as patient is presenting for bacteremia AM team to resume home antihypertensive medications when the benefits outweigh the risk Hydralazine 5 mg IV every 6 hours as needed for SBP greater 170, 5 days ordered

## 2023-06-11 NOTE — ED Provider Triage Note (Signed)
Emergency Medicine Provider Triage Evaluation Note  Tiffany Martinez , a 89 y.o. female  was evaluated in triage.  Pt complains of positive blood cultures. Was seen yesterday, WBC 29. Patient reports she feels weak and tired. No fevers or chills. No pain. +urinary frequency.  Review of Systems  Positive: Weak, tired Negative: cough  Physical Exam  LMP  (LMP Unknown)  Gen:   Awake, no distress   Resp:  Normal effort  MSK:   Moves extremities without difficulty  Other:    Medical Decision Making  Medically screening exam initiated at 2:47 PM.  Appropriate orders placed.  Tiffany Martinez was informed that the remainder of the evaluation will be completed by another provider, this initial triage assessment does not replace that evaluation, and the importance of remaining in the ED until their evaluation is complete.     Tiffany Hoehn, PA-C 06/11/23 1449

## 2023-06-12 ENCOUNTER — Other Ambulatory Visit: Payer: Self-pay

## 2023-06-12 ENCOUNTER — Inpatient Hospital Stay
Admit: 2023-06-12 | Discharge: 2023-06-12 | Disposition: A | Discharge status: Home / Self Care | Payer: Medicare Other | Attending: Internal Medicine

## 2023-06-12 DIAGNOSIS — R7881 Bacteremia: Secondary | ICD-10-CM | POA: Diagnosis not present

## 2023-06-12 LAB — CBC
HCT: 29.4 % — ABNORMAL LOW (ref 36.0–46.0)
Hemoglobin: 9.7 g/dL — ABNORMAL LOW (ref 12.0–15.0)
MCH: 34.9 pg — ABNORMAL HIGH (ref 26.0–34.0)
MCHC: 33 g/dL (ref 30.0–36.0)
MCV: 105.8 fL — ABNORMAL HIGH (ref 80.0–100.0)
Platelets: 98 10*3/uL — ABNORMAL LOW (ref 150–400)
RBC: 2.78 MIL/uL — ABNORMAL LOW (ref 3.87–5.11)
RDW: 14.5 % (ref 11.5–15.5)
WBC: 19.8 10*3/uL — ABNORMAL HIGH (ref 4.0–10.5)
nRBC: 0 % (ref 0.0–0.2)

## 2023-06-12 LAB — ECHOCARDIOGRAM COMPLETE
Area-P 1/2: 3.65 cm2
Calc EF: 53.8 %
Height: 61 in
MV VTI: 1.46 cm2
P 1/2 time: 453 ms
S' Lateral: 3.6 cm
Single Plane A2C EF: 56.6 %
Single Plane A4C EF: 53.5 %
Weight: 2176 [oz_av]

## 2023-06-12 LAB — BASIC METABOLIC PANEL
Anion gap: 6 (ref 5–15)
BUN: 27 mg/dL — ABNORMAL HIGH (ref 8–23)
CO2: 19 mmol/L — ABNORMAL LOW (ref 22–32)
Calcium: 8.9 mg/dL (ref 8.9–10.3)
Chloride: 113 mmol/L — ABNORMAL HIGH (ref 98–111)
Creatinine, Ser: 1.38 mg/dL — ABNORMAL HIGH (ref 0.44–1.00)
GFR, Estimated: 37 mL/min — ABNORMAL LOW (ref >60)
Glucose, Bld: 96 mg/dL (ref 70–99)
Potassium: 4 mmol/L (ref 3.5–5.1)
Sodium: 138 mmol/L (ref 135–145)

## 2023-06-12 LAB — MRSA NEXT GEN BY PCR, NASAL: MRSA by PCR Next Gen: NOT DETECTED

## 2023-06-12 LAB — FOLATE: Folate: >40 ng/mL (ref >5.9)

## 2023-06-12 MED ORDER — HYDRALAZINE HCL 20 MG/ML IJ SOLN
10.0000 mg | Freq: Four times a day (QID) | INTRAMUSCULAR | Status: DC | PRN
  Administered 2023-06-12 – 2023-06-13 (×3): 10 mg via INTRAVENOUS
  Filled 2023-06-12 (×3): qty 1

## 2023-06-12 MED ORDER — SODIUM BICARBONATE 650 MG PO TABS
650.0000 mg | ORAL_TABLET | Freq: Three times a day (TID) | ORAL | Status: AC
  Administered 2023-06-12 – 2023-06-13 (×6): 650 mg via ORAL
  Filled 2023-06-12 (×6): qty 1

## 2023-06-12 MED ORDER — HYDRALAZINE HCL 50 MG PO TABS
50.0000 mg | ORAL_TABLET | Freq: Four times a day (QID) | ORAL | Status: DC | PRN
  Administered 2023-06-12: 50 mg via ORAL
  Filled 2023-06-12: qty 1

## 2023-06-12 MED ORDER — GUAIFENESIN 100 MG/5ML PO LIQD
10.0000 mL | Freq: Once | ORAL | Status: AC
  Administered 2023-06-12: 10 mL via ORAL
  Filled 2023-06-12: qty 10

## 2023-06-12 MED ORDER — AMLODIPINE BESYLATE 10 MG PO TABS
10.0000 mg | ORAL_TABLET | Freq: Every day | ORAL | Status: DC
  Administered 2023-06-12 – 2023-06-16 (×5): 10 mg via ORAL
  Filled 2023-06-12 (×2): qty 1
  Filled 2023-06-12: qty 2
  Filled 2023-06-12: qty 1
  Filled 2023-06-12: qty 2

## 2023-06-12 MED ORDER — SODIUM CHLORIDE 0.9 % IV SOLN
2.0000 g | INTRAVENOUS | Status: DC
  Administered 2023-06-12: 2 g via INTRAVENOUS
  Filled 2023-06-12: qty 20

## 2023-06-12 NOTE — Progress Notes (Signed)
*  PRELIMINARY RESULTS* Echocardiogram 2D Echocardiogram has been performed.  Tiffany Martinez 06/12/2023, 11:32 AM

## 2023-06-12 NOTE — Progress Notes (Signed)
[facility] Progress Note  Patient: Tiffany Martinez    QVZ:[id number]  DOA: 06/11/2023     Date of Service: the patient was seen and examined on 06/12/2023  Chief Complaint  Patient presents with   Blood Infection   Brief hospital course: Ms. Wendy Hoback is a 89 year old female with history of hypertension, CLL, atrial fibrillation on Eliquis, CKD stage IIIb, chronic mesenteric ischemia, PAD, anxiety, who presents to the emergency department for chief concerns of called back due to abnormal blood cultures. Patient had syncopal episode on 1/24 and vomiting, she was brought into the ED, blood work was done and patient was sent home.  One of the blood cultures growing gram-negative rods so patient was called for readmission.  Patient feels weak and tired, no any other symptoms.  Vitals in the ED showed T 98, respiration rate of 16, heart rate of 129 and EDP states that at bedside it is in the 60s, blood pressure 170/57, SpO2 of 98% on room air.   Na 138,K 3.8, cl 108, bicarb 23, BUN of 29, s. Cr 1.49, eGFR 34,  nonfasting blood glucose 114, WBC 27, Hb 11.4, plts 110.   COVID/influenza A/influenza B/RSV PCR were negative.   UA was negative for leukocytes and nitrates.   UA was positive for greater than or equal to 300 protein.   Lactic acid 0.1.  High sensitive troponin 16.   Assessment and Plan:  # Gram-positive bacteremia, unknown source of infection Blood culture 1/2 from 1/24 growing gram-positive rods 1/25 blood culture growing gram-positive rods Complete echo ordered S/p Cefepime and vancomycin, d/c'd on 1/26  1/15 Discontinue metronidazole 1/26 started ceftriaxone after discussion with pharmacy TTE shows LVEF 50 to 55%, grade 2 diastolic dysfunction, moderately enlarged LA, no valvular vegetation, negative PFO. Follow complete blood culture report, de-escalate antibiotics according to the sensitivity    # CKD stage 3b, GFR 30-44 ml/min  Cr 1.55 ---1.38  improved 1/26 mild metabolic acidosis, CO2 19, started oral bicarbonate. Check BMP daily  Paroxysmal atrial fibrillation  Metoprolol 5 mg IV every 4 hours as needed for heart rate greater than 120, 3 doses ordered on admission Eliquis 2.5 mg p.o. twice daily resumed on admission   CLL (chronic lymphocytic leukemia)  At baseline leukocytosis Recheck CBC in the a.m.   Essential hypertension, benign Pending med reconciliation: Home amlodipine 5 mg daily, losartan 100 mg daily, metoprolol succinate 12.5 mg p.o. daily not resumed on admission as patient is presenting for bacteremia AM team to resume home antihypertensive medications when the benefits outweigh the risk Hydralazine 5 mg IV every 6 hours as needed for SBP greater 170, 5 days ordered    Body mass index is 25.7 kg/m.  Interventions:  Diet: Heart healthy diet DVT Prophylaxis: Subcutaneous Lovenox   Advance goals of care discussion: DNR/DNI-limited  Family Communication: family was present at bedside, at the time of interview.  The pt provided permission to discuss medical plan with the family. Opportunity was given to ask question and all questions were answered satisfactorily.   Disposition:  Pt is from Home, admitted with bacteremia, unknown source, still on antibiotics, waiting for final blood culture report, which precludes a safe discharge. Discharge to home, when stable.  Subjective: No significant events overnight, patient just feels generalized weakness and tiredness, no new specific complaints.  Physical Exam: General: NAD, lying comfortably Appear in no distress, affect appropriate Eyes: PERRLA ENT: Oral Mucosa Clear, moist  Neck: no JVD,  Cardiovascular: S1 and S2 Present, no  Murmur,  Respiratory: good respiratory effort, Bilateral Air entry equal and Decreased, no Crackles, no wheezes Abdomen: Bowel Sound present, Soft and no tenderness,  Skin: no rashes Extremities: no Pedal edema, no calf  tenderness Neurologic: without any new focal findings Gait not checked due to patient safety concerns  Vitals:   06/12/23 1000 06/12/23 1020 06/12/23 1030 06/12/23 1100  BP: (!) 179/57  (!) 171/137 (!) 184/47  Pulse: (!) 56  66 60  Resp: 15  (!) 24 17  Temp:  97.6 F (36.4 C)    TempSrc:  Oral    SpO2: 100%  100% 100%  Weight:      Height:        Intake/Output Summary (Last 24 hours) at 06/12/2023 1141 Last data filed at 06/12/2023 0719 Gross per 24 hour  Intake 1000 ml  Output --  Net 1000 ml   Filed Weights   06/11/23 1454  Weight: 61.7 kg    Data Reviewed: I have personally reviewed and interpreted daily labs, tele strips, imagings as discussed above. I reviewed all nursing notes, pharmacy notes, vitals, pertinent old records I have discussed plan of care as described above with RN and patient/family.  CBC: Recent Labs  Lab 06/10/23 1418 06/11/23 1500 06/12/23 0507  WBC 29.3* 27.0* 19.8*  NEUTROABS  --  4.2  --   HGB 11.5* 11.4* 9.7*  HCT 31.5* 34.1* 29.4*  MCV 112.9* 105.2* 105.8*  PLT 119* 110* 98*   Basic Metabolic Panel: Recent Labs  Lab 06/10/23 1418 06/11/23 1500 06/12/23 0507  NA 139 138 138  K 4.8 3.8 4.0  CL 108 108 113*  CO2 22 23 19*  GLUCOSE 156* 114* 96  BUN 36* 29* 27*  CREATININE 1.55* 1.49* 1.38*  CALCIUM 9.4 9.8 8.9    Studies: DG Chest 2 View Result Date: 06/11/2023 CLINICAL DATA:  Leukocytosis and bacteremia. EXAM: CHEST - 2 VIEW COMPARISON:  None Available. FINDINGS: Stable mild cardiomegaly. Aortic atherosclerotic calcification incidentally noted. Both lungs are clear. The visualized skeletal structures are unremarkable. IMPRESSION: Mild cardiomegaly.  No active lung disease. Electronically Signed   By: Danae Orleans M.D.   On: 06/11/2023 15:56    Scheduled Meds:  amLODipine  10 mg Oral Daily   apixaban  2.5 mg Oral BID   sodium bicarbonate  650 mg Oral TID   Continuous Infusions:  ceFEPime (MAXIPIME) IV     lactated  ringers 150 mL/hr (06/12/23 0723)   [START ON 06/13/2023] vancomycin     PRN Meds: acetaminophen **OR** acetaminophen, hydrALAZINE **OR** hydrALAZINE, ipratropium-albuterol, metoprolol tartrate, ondansetron **OR** ondansetron (ZOFRAN) IV, senna-docusate  Time spent: 35 minutes  Author: Gillis Santa. MD [facility] 06/12/2023 11:41 AM  To reach On-call, see care teams to locate the attending and reach out to them via [email or web address]. If 7PM-7AM, please contact night-coverage If you still have difficulty reaching the attending provider, please page the [facility] (Director on Call) for [facility] on amion for assistance.

## 2023-06-12 NOTE — Progress Notes (Signed)
PHARMACY - PHYSICIAN COMMUNICATION CRITICAL VALUE ALERT - BLOOD CULTURE IDENTIFICATION (BCID)  Results for orders placed or performed during the hospital encounter of 06/11/23  Resp panel by RT-PCR (RSV, Flu A&B, Covid) Anterior Nasal Swab     Status: None   Collection Time: 06/11/23  3:00 PM   Specimen: Anterior Nasal Swab  Result Value Ref Range Status   SARS Coronavirus 2 by RT PCR NEGATIVE NEGATIVE Final    Comment: (NOTE) SARS-CoV-2 target nucleic acids are NOT DETECTED.  The SARS-CoV-2 RNA is generally detectable in upper respiratory specimens during the acute phase of infection. The lowest concentration of SARS-CoV-2 viral copies this assay can detect is 138 copies/mL. A negative result does not preclude SARS-Cov-2 infection and should not be used as the sole basis for treatment or other patient management decisions. A negative result may occur with  improper specimen collection/handling, submission of specimen other than nasopharyngeal swab, presence of viral mutation(s) within the areas targeted by this assay, and inadequate number of viral copies(<138 copies/mL). A negative result must be combined with clinical observations, patient history, and epidemiological information. The expected result is Negative.  Fact Sheet for Patients:  BloggerCourse.com  Fact Sheet for Healthcare Providers:  SeriousBroker.it  This test is no t yet approved or cleared by the [place] FDA and  has been authorized for detection and/or diagnosis of SARS-CoV-2 by FDA under an Emergency Use Authorization (EUA). This EUA will remain  in effect (meaning this test can be used) for the duration of the COVID-19 declaration under Section 564(b)(1) of the Act, 21 U.S.C.section 360bbb-3(b)(1), unless the authorization is terminated  or revoked sooner.       Influenza A by PCR NEGATIVE NEGATIVE Final   Influenza B by PCR NEGATIVE NEGATIVE Final     Comment: (NOTE) The Xpert Xpress SARS-CoV-2/FLU/RSV plus assay is intended as an aid in the diagnosis of influenza from Nasopharyngeal swab specimens and should not be used as a sole basis for treatment. Nasal washings and aspirates are unacceptable for Xpert Xpress SARS-CoV-2/FLU/RSV testing.  Fact Sheet for Patients: BloggerCourse.com  Fact Sheet for Healthcare Providers: SeriousBroker.it  This test is not yet approved or cleared by the [place] FDA and has been authorized for detection and/or diagnosis of SARS-CoV-2 by FDA under an Emergency Use Authorization (EUA). This EUA will remain in effect (meaning this test can be used) for the duration of the COVID-19 declaration under Section 564(b)(1) of the Act, 21 U.S.C. section 360bbb-3(b)(1), unless the authorization is terminated or revoked.     Resp Syncytial Virus by PCR NEGATIVE NEGATIVE Final    Comment: (NOTE) Fact Sheet for Patients: BloggerCourse.com  Fact Sheet for Healthcare Providers: SeriousBroker.it  This test is not yet approved or cleared by the [place] FDA and has been authorized for detection and/or diagnosis of SARS-CoV-2 by FDA under an Emergency Use Authorization (EUA). This EUA will remain in effect (meaning this test can be used) for the duration of the COVID-19 declaration under Section 564(b)(1) of the Act, 21 U.S.C. section 360bbb-3(b)(1), unless the authorization is terminated or revoked.  Performed at [facility], [address] [place], [place] [address]   Culture, blood (routine x 2)     Status: None (Preliminary result)   Collection Time: 06/11/23  3:02 PM   Specimen: BLOOD RIGHT ARM  Result Value Ref Range Status   Specimen Description BLOOD RIGHT ARM  Final   Special Requests   Final    BOTTLES DRAWN AEROBIC  AND ANAEROBIC Blood Culture adequate volume   Culture   Setup Time   Final    GRAM POSITIVE RODS ANAEROBIC BOTTLE ONLY CRITICAL RESULT CALLED TO, READ BACK BY AND VERIFIED WITH: Lynel Forester AT 0526 06/12/23.PMF Performed at [facility], [address], [place], [place] [address]    Culture GRAM POSITIVE RODS  Final   Report Status PENDING  Incomplete    BCID Results: 1 (anaerobic) bottle of 4 w/ GPRs. Lab sending sample to [facility]. Pt currently on Cefepime and Vancomycin   Name of provider contacted: Cliffton Asters, NP   Changes to prescribed antibiotics required: No changes at this time pending addition lab cx results.  Otelia Sergeant, PharmD, [facility] 06/12/2023 5:35 AM

## 2023-06-12 NOTE — Progress Notes (Signed)
       CROSS COVER NOTE  NAME: Karter Haire MRN: [id number] DOB : 1933-07-27 ATTENDING PHYSICIAN: Arnetha Courser, MD    Date of Service   06/12/2023   HPI/Events of Note   Message received from nurse Ms Loconte says she feels strange, (like she has the flu, congested and chest tightness) I told her that we tested for flu & it was negative. I just ran an EKG one her   Interventions   Assessment/Plan: EKG without STE  or ischemic changes Likely congestion with sleep  Robitussin Mobilize IS  fluttervalve        Donnie Mesa NP [facility] Cross Cover 7pm-7am - check amion for availability Pager [phone]

## 2023-06-13 ENCOUNTER — Inpatient Hospital Stay: Payer: Medicare Other

## 2023-06-13 DIAGNOSIS — N1832 Chronic kidney disease, stage 3b: Secondary | ICD-10-CM | POA: Diagnosis not present

## 2023-06-13 DIAGNOSIS — C9110 Chronic lymphocytic leukemia of B-cell type not having achieved remission: Secondary | ICD-10-CM | POA: Diagnosis not present

## 2023-06-13 DIAGNOSIS — K8020 Calculus of gallbladder without cholecystitis without obstruction: Secondary | ICD-10-CM | POA: Diagnosis not present

## 2023-06-13 DIAGNOSIS — Z8679 Personal history of other diseases of the circulatory system: Secondary | ICD-10-CM

## 2023-06-13 DIAGNOSIS — Z9889 Other specified postprocedural states: Secondary | ICD-10-CM

## 2023-06-13 DIAGNOSIS — B9689 Other specified bacterial agents as the cause of diseases classified elsewhere: Secondary | ICD-10-CM | POA: Diagnosis not present

## 2023-06-13 DIAGNOSIS — I7143 Infrarenal abdominal aortic aneurysm, without rupture: Secondary | ICD-10-CM | POA: Diagnosis not present

## 2023-06-13 DIAGNOSIS — Z96641 Presence of right artificial hip joint: Secondary | ICD-10-CM

## 2023-06-13 DIAGNOSIS — I739 Peripheral vascular disease, unspecified: Secondary | ICD-10-CM | POA: Diagnosis not present

## 2023-06-13 DIAGNOSIS — N281 Cyst of kidney, acquired: Secondary | ICD-10-CM | POA: Diagnosis not present

## 2023-06-13 DIAGNOSIS — R7881 Bacteremia: Secondary | ICD-10-CM | POA: Diagnosis not present

## 2023-06-13 DIAGNOSIS — K5730 Diverticulosis of large intestine without perforation or abscess without bleeding: Secondary | ICD-10-CM | POA: Diagnosis not present

## 2023-06-13 LAB — CBC
HCT: 30 % — ABNORMAL LOW (ref 36.0–46.0)
Hemoglobin: 10.1 g/dL — ABNORMAL LOW (ref 12.0–15.0)
MCH: 36.1 pg — ABNORMAL HIGH (ref 26.0–34.0)
MCHC: 33.7 g/dL (ref 30.0–36.0)
MCV: 107.1 fL — ABNORMAL HIGH (ref 80.0–100.0)
Platelets: 93 10*3/uL — ABNORMAL LOW (ref 150–400)
RBC: 2.8 MIL/uL — ABNORMAL LOW (ref 3.87–5.11)
RDW: 14.6 % (ref 11.5–15.5)
WBC: 21.3 10*3/uL — ABNORMAL HIGH (ref 4.0–10.5)
nRBC: 0 % (ref 0.0–0.2)

## 2023-06-13 LAB — BASIC METABOLIC PANEL
Anion gap: 9 (ref 5–15)
BUN: 24 mg/dL — ABNORMAL HIGH (ref 8–23)
CO2: 19 mmol/L — ABNORMAL LOW (ref 22–32)
Calcium: 9.5 mg/dL (ref 8.9–10.3)
Chloride: 112 mmol/L — ABNORMAL HIGH (ref 98–111)
Creatinine, Ser: 1.39 mg/dL — ABNORMAL HIGH (ref 0.44–1.00)
GFR, Estimated: 37 mL/min — ABNORMAL LOW (ref >60)
Glucose, Bld: 99 mg/dL (ref 70–99)
Potassium: 3.4 mmol/L — ABNORMAL LOW (ref 3.5–5.1)
Sodium: 140 mmol/L (ref 135–145)

## 2023-06-13 LAB — CBG MONITORING, ED: Glucose-Capillary: 113 mg/dL — ABNORMAL HIGH (ref 70–99)

## 2023-06-13 LAB — VITAMIN D 25 HYDROXY (VIT D DEFICIENCY, FRACTURES): Vit D, 25-Hydroxy: 36.92 ng/mL (ref 30–100)

## 2023-06-13 LAB — MAGNESIUM: Magnesium: 1.9 mg/dL (ref 1.7–2.4)

## 2023-06-13 LAB — VITAMIN B12: Vitamin B-12: 664 pg/mL (ref 180–914)

## 2023-06-13 LAB — PHOSPHORUS: Phosphorus: 2.7 mg/dL (ref 2.5–4.6)

## 2023-06-13 MED ORDER — POTASSIUM CHLORIDE 20 MEQ PO PACK
40.0000 meq | PACK | Freq: Once | ORAL | Status: AC
  Administered 2023-06-13: 40 meq via ORAL
  Filled 2023-06-13: qty 2

## 2023-06-13 MED ORDER — IOHEXOL 350 MG/ML SOLN
75.0000 mL | Freq: Once | INTRAVENOUS | Status: AC | PRN
  Administered 2023-06-13: 75 mL via INTRAVENOUS

## 2023-06-13 MED ORDER — SODIUM CHLORIDE 0.9 % IV SOLN
3.0000 g | Freq: Two times a day (BID) | INTRAVENOUS | Status: DC
  Administered 2023-06-13 – 2023-06-16 (×7): 3 g via INTRAVENOUS
  Filled 2023-06-13 (×9): qty 8

## 2023-06-13 MED ORDER — POTASSIUM CHLORIDE CRYS ER 20 MEQ PO TBCR
40.0000 meq | EXTENDED_RELEASE_TABLET | Freq: Once | ORAL | Status: AC
  Administered 2023-06-13: 40 meq via ORAL
  Filled 2023-06-13: qty 2

## 2023-06-13 NOTE — ED Notes (Addendum)
Secured chat Larkin Ina, NP: "In the last hour she said she feels increased weakness compared to earlier today. She also states she feels here heart "jumping around" which she did not feel before. She also stated some nausea. Her most recent vitals are 169/52 BP, 93 HR, 16 RR, and 99% on RA. She is asking to be reevaluated by the doctor." Was told to get a CBG and EKG

## 2023-06-13 NOTE — Progress Notes (Signed)
Informed Consent   Shared Decision Making/Informed Consent   The risks [esophageal damage, perforation (1:10,000 risk), bleeding, pharyngeal hematoma as well as other potential complications associated with conscious sedation including aspiration, arrhythmia, respiratory failure and death], benefits (treatment guidance and diagnostic support) and alternatives of a transesophageal echocardiogram were discussed in detail with Tiffany Martinez and she is willing to proceed.      Orion Crook, PA-C

## 2023-06-13 NOTE — Plan of Care (Signed)

## 2023-06-13 NOTE — ED Notes (Signed)
Patient attempted to take potassium, but could not tolerate. Vomited pill back up. Chipper Herb, MD, made aware.

## 2023-06-13 NOTE — Consult Note (Signed)
NAME: Tiffany Martinez  DOB: 06-Mar-1934  MRN: [id number]  Date/Time: 06/13/2023 12:46 PM  REQUESTING PROVIDER: Dr. Chipper Herb Subjective:  REASON FOR CONSULT: Bacteremia ? Tiffany Martinez is a 89 y.o. with a history of CLL, hypertension, atrial fibrillation on Eliquis, CKD, chronic mesenteric ischemia, PAD, anxiety presented to the emergency department on 06/10/2023 after passing out when she tried to get out of the  hair salon chair..  When the EMS arrived she had a heart rate of 40s.  On the ED that day her BP was 197/61, pulse 57, respiratory rate 18 and temperature 98.Labs revealed a WBC of 29, Hb 11.5, platelet 19 and creatinine 1.55.  BNP was 260.  She was monitored in the ED and heart rate was 50-60.  Troponins were negative lactate was normal UA was not consistent with UTI EKG was normal and patient was discharged home after shared decision making. She was called back on 06/11/2023 as the blood culture was positive for bacteria Gram-positive rods in anaerobic bottle only.  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 She says she was recently told by her new PCP that she has CLL She has not seen heme/onc She has had multiple falls the past few years and has  had a few broken bones She was a Engineer, civil (consulting), originally from [place] She is a smoker Drinks a glass  red wine a day  Past Medical History:  Diagnosis Date   AAA (abdominal aortic aneurysm) (HCC) 2012   3.9cm 2012; 2.8cm 12/2021   Anemia    Anxiety    CHF (congestive heart failure) (HCC)    LVEF 60-65% in 2023   Chronic venous insufficiency    CLL (chronic lymphocytic leukemia) (HCC) 2014    Stage 0--by flow cytometry   Hx of colonoscopy    Hyperlipidemia    Hypertension    Melanoma (HCC)    Right Arm   Migraine headache    3-4X/yr   PAD (peripheral artery disease) (HCC) 2012   severe R common iliac stenosis, ABI 0.73, mid L common iliac stenosis ABI 1.1   PAD (peripheral artery disease) (HCC)    Paroxysmal atrial fibrillation (HCC) 12/2021   Wears dentures    full upper and lower    Past Surgical History:  Procedure Laterality Date   ABDOMINAL AORTIC ANEURYSM REPAIR  02/16/12   [facility]--Dr Pattricia Boss   CARDIOVASCULAR STRESS TEST  2008   negative   CATARACT EXTRACTION W/PHACO Left 07/16/2019   Procedure: CATARACT EXTRACTION PHACO AND INTRAOCULAR LENS PLACEMENT (IOC) LEFT 8.26  00:46.6;  Surgeon: Nevada Crane, MD;  Location: [facility];  Service: Ophthalmology;  Laterality: Left;   DOBUTAMINE STRESS ECHO  11/11   negative   HIP ARTHROPLASTY Right 10/10/2019   Procedure: ARTHROPLASTY BIPOLAR HIP (HEMIARTHROPLASTY);  Surgeon: Christena Flake, MD;  Location: [facility];  Service: Orthopedics;  Laterality: Right;   KNEE ARTHROSCOPY  1998   right   MELANOMA EXCISION  11/09/10   wide excision right upper arm   SQUAMOUS CELL CARCINOMA EXCISION  02/2016   TONSILLECTOMY AND ADENOIDECTOMY  1960    Social History   Socioeconomic  History   Marital status: Widowed    Spouse name: Not on file   Number of children: 4   Years of education: Not on file   Highest education level: Not on file  Occupational History   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,   Tobacco Use   Smoking status: Every Day    Current packs/day: 0.00    Types: Cigarettes    Last attempt to quit: 03/17/2021    Years since quitting: 2.2    Passive exposure: Never   Smokeless tobacco: Never   Tobacco comments:    occassionally  Vaping Use   Vaping status: Never Used  Substance and Sexual Activity   Alcohol use: Yes    Alcohol/week: 7.0 standard drinks of alcohol    Types: 7 Glasses of wine per week    Comment:  regular wine with dinner   Drug use: No   Sexual activity: Never  Other Topics Concern   Not on file  Social History Narrative   Widowed 2/11 -2nd for him , 1st for her. 4 step sons. 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         Has living will and health care POA--changing her POA at this point   Requests DNR   Discussed MOST form---but would probably accept IV fluids/antibiotics, etc   Requests no feeding tube         Social Drivers of Corporate investment banker Strain: Not on file  Food Insecurity: Not on file  Transportation Needs: Not on file  Physical Activity: Not on file  Stress: Not on file  Social Connections: Not on file  Intimate Partner Violence: Not on file    Family History  Problem Relation Age of Onset   Alzheimer's disease Mother    Pancreatic cancer Father    Parkinson's disease Brother    Coronary artery disease Neg Hx    Diabetes Neg Hx    Cancer Neg Hx        breast or colon   Allergies  Allergen Reactions   Triamterene Hives   I? Current Facility-Administered Medications  Medication Dose Route Frequency Provider Last Rate Last Admin   acetaminophen (TYLENOL) tablet 650 mg  650 mg Oral Q6H PRN Cox, Amy N, DO       Or   acetaminophen (TYLENOL) suppository 650 mg  650 mg Rectal Q6H PRN Cox, Amy N, DO       amLODipine (NORVASC) tablet 10 mg  10 mg Oral Daily Gillis Santa, MD   10 mg at 06/13/23 1008   apixaban (ELIQUIS) tablet 2.5 mg  2.5 mg Oral BID Cox, Amy N, DO   2.5 mg at 06/13/23 1008   cefTRIAXone (ROCEPHIN) 2 g in sodium chloride 0.9 % 100 mL IVPB  2 g Intravenous Q24H Gillis Santa, MD   Stopped at 06/12/23 1516   hydrALAZINE (APRESOLINE) tablet 50 mg  50 mg Oral Q6H PRN Gillis Santa, MD   50 mg at 06/12/23 1530   Or   hydrALAZINE (APRESOLINE) injection 10 mg  10 mg Intravenous Q6H PRN Gillis Santa, MD   10 mg at 06/13/23 0616   ipratropium-albuterol (DUONEB) 0.5-2.5 (3) MG/3ML nebulizer solution  3 mL  3 mL Nebulization Q6H PRN Cox, Amy N, DO       metoprolol tartrate (LOPRESSOR) injection 5 mg  5 mg Intravenous Q4H PRN Cox, Amy N, DO       ondansetron (ZOFRAN) tablet 4 mg  4 mg Oral Q6H PRN Cox, Amy N, DO       Or   ondansetron (ZOFRAN) injection 4 mg  4 mg Intravenous Q6H PRN Cox, Amy N, DO   4 mg at 06/12/23 2312   senna-docusate (Senokot-S) tablet 1 tablet  1 tablet Oral QHS PRN Cox, Amy N, DO       sodium bicarbonate tablet 650 mg  650 mg Oral TID Gillis Santa, MD   650 mg at 06/13/23 1008   Current Outpatient Medications  Medication Sig Dispense Refill   amLODipine (NORVASC) 5 MG tablet Take 1 tablet (5 mg total) by mouth daily. 90 tablet 3   apixaban (ELIQUIS) 2.5 MG TABS tablet TAKE 1 TABLET BY MOUTH TWICE DAILY 180 tablet 1   loratadine (CLARITIN) 10 MG tablet Take 10 mg by mouth daily.     losartan (COZAAR) 100 MG tablet Take 1 tablet (100 mg total) by mouth daily. 90 tablet 3   metoprolol succinate (TOPROL-XL) 25 MG 24 hr tablet Take 0.5 tablets (12.5 mg total) by mouth daily. 90 tablet 3   Multiple Vitamin (MULTIVITAMIN ADULT PO) Take 1 tablet by mouth daily.       Abtx:  Anti-infectives (From admission, onward)    Start     Dose/Rate Route Frequency Ordered Stop   06/13/23 1830  vancomycin (VANCOCIN) IVPB 1000 mg/200 mL premix  Status:  Discontinued        1,000 mg 200 mL/hr over 60 Minutes Intravenous Every 48 hours 06/11/23 1845 06/12/23 1309   06/12/23 1800  ceFEPIme (MAXIPIME) 2 g in sodium chloride 0.9 % 100 mL IVPB  Status:  Discontinued        2 g 200 mL/hr over 30 Minutes Intravenous Every 24 hours 06/11/23 1815 06/12/23 1309   06/12/23 1400  cefTRIAXone (ROCEPHIN) 2 g in sodium chloride 0.9 % 100 mL IVPB        2 g 200 mL/hr over 30 Minutes Intravenous Every 24 hours 06/12/23 1309     06/11/23 1845  vancomycin (VANCOREADY) IVPB 1500 mg/300 mL        1,500 mg 150 mL/hr over 120 Minutes Intravenous  Once 06/11/23 1837 06/11/23 2108   06/11/23 1815   metroNIDAZOLE (FLAGYL) IVPB 500 mg  Status:  Discontinued        500 mg 100 mL/hr over 60 Minutes Intravenous Every 12 hours 06/11/23 1807 06/11/23 1825   06/11/23 1745  ceFEPIme (MAXIPIME) 2 g in sodium chloride 0.9 % 100 mL IVPB        2 g 200 mL/hr over 30 Minutes Intravenous  Once 06/11/23 1740 06/11/23 1850   06/11/23 1745  metroNIDAZOLE (FLAGYL) IVPB 500 mg  Status:  Discontinued        500 mg 100 mL/hr over 60 Minutes Intravenous  Once 06/11/23 1740 06/11/23 1807   06/11/23 1745  vancomycin (VANCOCIN) IVPB 1000 mg/200 mL premix  Status:  Discontinued        1,000 mg 200 mL/hr over 60 Minutes Intravenous  Once 06/11/23 1740 06/11/23 1837       REVIEW OF SYSTEMS:  Const: negative fever, negative chills, negative weight loss Eyes: negative diplopia or visual changes, negative eye pain ENT: negative coryza, negative sore throat Resp:+ cough, hemoptysis, no dyspnea Cards: negative for chest pain, palpitations, lower extremity edema GU: negative for frequency, dysuria and hematuria GI: + abdominal pain, nausea diarrhea, no bleeding, constipation Skin: negative for rash and pruritus Heme: negative for easy bruising and gum/nose bleeding  MS:  weakness Neurolo:falls, near black out Psych:  anxiety, depression  Endocrine: negative for thyroid, diabetes Allergy/Immunology- triamterene Objective:  VITALS:  BP (!) 159/102   Pulse 80   Temp 98.4 F (36.9 C) (Oral)   Resp 20   Ht 5\' 1"  (1.549 m)   Wt 61.7 kg   LMP  (LMP Unknown)   SpO2 100%   BMI 25.70 kg/m   PHYSICAL EXAM:  General: Alert, cooperative, no distress, appears stated age.  Head: Normocephalic, without obvious abnormality, atraumatic. Eyes: Conjunctivae clear, anicteric sclerae. Pupils are equal ENT Nares normal. No drainage or sinus tenderness. Lips, mucosa, and tongue normal. No Thrush Full dentures Neck: Supple, symmetrical, no adenopathy, thyroid: non tender no carotid bruit and no JVD. Back: No CVA  tenderness. Lungs: Clear to auscultation bilaterally. No Wheezing or Rhonchi. No rales. Heart: Regular rate and rhythm, no murmur, rub or gallop. Abdomen: Soft, non-tender,not distended. Bowel sounds normal. No masses Extremities: atraumatic, no cyanosis. No edema. No clubbing Skin:  bruising Lymph: Cervical, supraclavicular normal. Neurologic: Grossly non-focal Pertinent Labs Lab Results CBC    Component Value Date/Time   WBC 21.3 (H) 06/13/2023 0500   RBC 2.80 (L) 06/13/2023 0500   HGB 10.1 (L) 06/13/2023 0500   HGB 11.6 (L) 08/21/2014 1141   HCT 30.0 (L) 06/13/2023 0500   HCT 34.1 (L) 08/21/2014 1141   PLT 93 (L) 06/13/2023 0500   PLT 183 08/21/2014 1141   MCV 107.1 (H) 06/13/2023 0500   MCV 107 (H) 08/21/2014 1141   MCH 36.1 (H) 06/13/2023 0500   MCHC 33.7 06/13/2023 0500   RDW 14.6 06/13/2023 0500   RDW 15.3 (H) 08/21/2014 1141   LYMPHSABS 21.7 (H) 06/11/2023 1500   LYMPHSABS 10.4 (H) 06/20/2012 1403   MONOABS 0.9 06/11/2023 1500   MONOABS 0.5 06/20/2012 1403   EOSABS 0.1 06/11/2023 1500   EOSABS 0.2 06/20/2012 1403   BASOSABS 0.1 06/11/2023 1500   BASOSABS 0.1 06/20/2012 1403       Latest Ref Rng & Units 06/13/2023    5:00 AM 06/12/2023    5:07 AM 06/11/2023    3:00 PM  CMP  Glucose 70 - 99 mg/dL 99  96  161   BUN 8 - 23 mg/dL 24  27  29    Creatinine 0.44 - 1.00 mg/dL 0.96  0.45  4.09   Sodium 135 - 145 mmol/L 140  138  138   Potassium 3.5 - 5.1 mmol/L 3.4  4.0  3.8   Chloride 98 - 111 mmol/L 112  113  108   CO2 22 - 32 mmol/L 19  19  23    Calcium 8.9 - 10.3 mg/dL 9.5  8.9  9.8   Total Protein 6.5 - 8.1 g/dL   6.7   Total Bilirubin 0.0 - 1.2 mg/dL   1.0   Alkaline Phos 38 - 126 U/L   68   AST 15 - 41 U/L   27   ALT 0 - 44 U/L   15       Microbiology: Recent Results (from the past 240 hours)  Resp panel by RT-PCR (RSV, Flu A&B, Covid) Anterior Nasal Swab     Status: None   Collection Time: 06/10/23  3:11 PM   Specimen: Anterior Nasal Swab  Result  Value Ref Range Status   SARS Coronavirus 2 by RT PCR NEGATIVE NEGATIVE Final    Comment: (NOTE) SARS-CoV-2 target nucleic acids are NOT DETECTED.  The SARS-CoV-2 RNA is generally detectable in  upper respiratory specimens during the acute phase of infection. The lowest concentration of SARS-CoV-2 viral copies this assay can detect is 138 copies/mL. A negative result does not preclude SARS-Cov-2 infection and should not be used as the sole basis for treatment or other patient management decisions. A negative result may occur with  improper specimen collection/handling, submission of specimen other than nasopharyngeal swab, presence of viral mutation(s) within the areas targeted by this assay, and inadequate number of viral copies(<138 copies/mL). A negative result must be combined with clinical observations, patient history, and epidemiological information. The expected result is Negative.  Fact Sheet for Patients:  BloggerCourse.com  Fact Sheet for Healthcare Providers:  SeriousBroker.it  This test is no t yet approved or cleared by the [place] FDA and  has been authorized for detection and/or diagnosis of SARS-CoV-2 by FDA under an Emergency Use Authorization (EUA). This EUA will remain  in effect (meaning this test can be used) for the duration of the COVID-19 declaration under Section 564(b)(1) of the Act, 21 U.S.C.section 360bbb-3(b)(1), unless the authorization is terminated  or revoked sooner.       Influenza A by PCR NEGATIVE NEGATIVE Final   Influenza B by PCR NEGATIVE NEGATIVE Final    Comment: (NOTE) The Xpert Xpress SARS-CoV-2/FLU/RSV plus assay is intended as an aid in the diagnosis of influenza from Nasopharyngeal swab specimens and should not be used as a sole basis for treatment. Nasal washings and aspirates are unacceptable for Xpert Xpress SARS-CoV-2/FLU/RSV testing.  Fact Sheet for  Patients: BloggerCourse.com  Fact Sheet for Healthcare Providers: SeriousBroker.it  This test is not yet approved or cleared by the [place] FDA and has been authorized for detection and/or diagnosis of SARS-CoV-2 by FDA under an Emergency Use Authorization (EUA). This EUA will remain in effect (meaning this test can be used) for the duration of the COVID-19 declaration under Section 564(b)(1) of the Act, 21 U.S.C. section 360bbb-3(b)(1), unless the authorization is terminated or revoked.     Resp Syncytial Virus by PCR NEGATIVE NEGATIVE Final    Comment: (NOTE) Fact Sheet for Patients: BloggerCourse.com  Fact Sheet for Healthcare Providers: SeriousBroker.it  This test is not yet approved or cleared by the [place] FDA and has been authorized for detection and/or diagnosis of SARS-CoV-2 by FDA under an Emergency Use Authorization (EUA). This EUA will remain in effect (meaning this test can be used) for the duration of the COVID-19 declaration under Section 564(b)(1) of the Act, 21 U.S.C. section 360bbb-3(b)(1), unless the authorization is terminated or revoked.  Performed at [facility], [address] [place], [place] [address]   Blood culture (routine x 2)     Status: None (Preliminary result)   Collection Time: 06/10/23  3:11 PM   Specimen: BLOOD RIGHT HAND  Result Value Ref Range Status   Specimen Description BLOOD RIGHT HAND  Final   Special Requests   Final    BOTTLES DRAWN AEROBIC ONLY Blood Culture results may not be optimal due to an inadequate volume of blood received in culture bottles   Culture   Final    NO GROWTH 3 DAYS Performed at [facility], [address] [place], [place] [address]    Report Status PENDING  Incomplete  Blood culture (routine x 2)     Status: None (Preliminary result)   Collection Time: 06/10/23  4:39 PM    Specimen: Right Antecubital; Blood  Result Value Ref Range Status   Specimen Description   Final  RIGHT ANTECUBITAL Performed at [facility], [address] [place], [place] [address]    Special Requests   Final    BOTTLES DRAWN AEROBIC AND ANAEROBIC Blood Culture adequate volume Performed at [facility], [address] [place], [place] [address]    Culture  Setup Time   Final    ANAEROBIC BOTTLE ONLY GRAM POSITIVE RODS CRITICAL RESULT CALLED TO, READ BACK BY AND VERIFIED WITH: Filippa MURRAY @0840  06/11/23 MJU    Culture   Final    GRAM POSITIVE RODS CULTURE REINCUBATED FOR BETTER GROWTH Performed at [facility], [address], [place], [place] [address]    Report Status PENDING  Incomplete  Resp panel by RT-PCR (RSV, Flu A&B, Covid) Anterior Nasal Swab     Status: None   Collection Time: 06/11/23  3:00 PM   Specimen: Anterior Nasal Swab  Result Value Ref Range Status   SARS Coronavirus 2 by RT PCR NEGATIVE NEGATIVE Final    Comment: (NOTE) SARS-CoV-2 target nucleic acids are NOT DETECTED.  The SARS-CoV-2 RNA is generally detectable in upper respiratory specimens during the acute phase of infection. The lowest concentration of SARS-CoV-2 viral copies this assay can detect is 138 copies/mL. A negative result does not preclude SARS-Cov-2 infection and should not be used as the sole basis for treatment or other patient management decisions. A negative result may occur with  improper specimen collection/handling, submission of specimen other than nasopharyngeal swab, presence of viral mutation(s) within the areas targeted by this assay, and inadequate number of viral copies(<138 copies/mL). A negative result must be combined with clinical observations, patient history, and epidemiological information. The expected result is Negative.  Fact Sheet for Patients:  BloggerCourse.com  Fact Sheet for Healthcare Providers:   SeriousBroker.it  This test is no t yet approved or cleared by the [place] FDA and  has been authorized for detection and/or diagnosis of SARS-CoV-2 by FDA under an Emergency Use Authorization (EUA). This EUA will remain  in effect (meaning this test can be used) for the duration of the COVID-19 declaration under Section 564(b)(1) of the Act, 21 U.S.C.section 360bbb-3(b)(1), unless the authorization is terminated  or revoked sooner.       Influenza A by PCR NEGATIVE NEGATIVE Final   Influenza B by PCR NEGATIVE NEGATIVE Final    Comment: (NOTE) The Xpert Xpress SARS-CoV-2/FLU/RSV plus assay is intended as an aid in the diagnosis of influenza from Nasopharyngeal swab specimens and should not be used as a sole basis for treatment. Nasal washings and aspirates are unacceptable for Xpert Xpress SARS-CoV-2/FLU/RSV testing.  Fact Sheet for Patients: BloggerCourse.com  Fact Sheet for Healthcare Providers: SeriousBroker.it  This test is not yet approved or cleared by the [place] FDA and has been authorized for detection and/or diagnosis of SARS-CoV-2 by FDA under an Emergency Use Authorization (EUA). This EUA will remain in effect (meaning this test can be used) for the duration of the COVID-19 declaration under Section 564(b)(1) of the Act, 21 U.S.C. section 360bbb-3(b)(1), unless the authorization is terminated or revoked.     Resp Syncytial Virus by PCR NEGATIVE NEGATIVE Final    Comment: (NOTE) Fact Sheet for Patients: BloggerCourse.com  Fact Sheet for Healthcare Providers: SeriousBroker.it  This test is not yet approved or cleared by the [place] FDA and has been authorized for detection and/or diagnosis of SARS-CoV-2 by FDA under an Emergency Use Authorization (EUA). This EUA will remain in effect (meaning this test can be used)  for  the duration of the COVID-19 declaration under Section 564(b)(1) of the Act, 21 U.S.C. section 360bbb-3(b)(1), unless the authorization is terminated or revoked.  Performed at [facility], [address] [place], [place] [address]   Culture, blood (routine x 2)     Status: None (Preliminary result)   Collection Time: 06/11/23  3:02 PM   Specimen: BLOOD RIGHT FOREARM  Result Value Ref Range Status   Specimen Description   Final    BLOOD RIGHT FOREARM Performed at [facility], [address], [place], [place] [address]    Special Requests   Final    BOTTLES DRAWN AEROBIC AND ANAEROBIC Blood Culture adequate volume Performed at [facility], [address] [place], [place] [address]    Culture  Setup Time   Final    GRAM POSITIVE RODS ANAEROBIC BOTTLE ONLY CRITICAL VALUE NOTED.  VALUE IS CONSISTENT WITH PREVIOUSLY REPORTED AND CALLED VALUE.    Culture   Final    CULTURE REINCUBATED FOR BETTER GROWTH Performed at [facility], [address], [place], [place] [address]    Report Status PENDING  Incomplete  Culture, blood (routine x 2)     Status: None (Preliminary result)   Collection Time: 06/11/23  3:02 PM   Specimen: BLOOD RIGHT ARM  Result Value Ref Range Status   Specimen Description   Final    BLOOD RIGHT ARM Performed at [facility], [address] [place], [place] [address]    Special Requests   Final    BOTTLES DRAWN AEROBIC AND ANAEROBIC Blood Culture adequate volume Performed at [facility], [address] [place], [place] [address]    Culture  Setup Time   Final    GRAM POSITIVE RODS ANAEROBIC BOTTLE ONLY CRITICAL RESULT CALLED TO, READ BACK BY AND VERIFIED WITH: NATHAN BELUE AT 5956 06/12/23.PMF    Culture   Final    CULTURE REINCUBATED FOR BETTER GROWTH Performed at [facility], [address], [place], [place] [address]    Report Status PENDING  Incomplete  MRSA Next Gen by PCR, Nasal      Status: None   Collection Time: 06/12/23 10:20 PM   Specimen: Nasal Mucosa; Nasal Swab  Result Value Ref Range Status   MRSA by PCR Next Gen NOT DETECTED NOT DETECTED Final    Comment: (NOTE) The GeneXpert MRSA Assay (FDA approved for NASAL specimens only), is one component of a comprehensive MRSA colonization surveillance program. It is not intended to diagnose MRSA infection nor to guide or monitor treatment for MRSA infections. Test performance is not FDA approved in patients less than 36 years old. Performed at [facility], [address] [place], [place] [address]     IMAGING RESULTS:  I have personally reviewed the films ?no inflitrate   Impression/Recommendation ? Clostridium ramosum bacteremia- this is a GI organism- rare but  associated with intraabdominal infections-  She has infrarenal aortic aneurysm and has had endovascular aneurysm repair in 2013 9 EVAR- aortic Bi-iliac  stent/graft  Need to make sure that this is not infected  She has chronic mesenteric ischemia - case report of this bacteria being associated with this condition and causing bacteremia is known  She has CLL and is immune compromised which could be put her at risk  Could she have GI malignancy?   Anemia  She needs  CT abdomen/CTA to look for aortitis TEE to r/o endocardiits ? Colonoscopy (  which she is not keen) Pt currently on ceftriaxone- change to Unasyn   Near syncope Would recommend getitng cardiology involved Htn she is on high dose losartan, metoprolol, amlodipine  Afib- on eliquis  Falls- causing fractures in the past 4 years Rt hip- has THA Wrist fractures Pubic bone fracture  Current smoker 1 glass redwime every day .    ________________________________________________ Discussed with patient, requesting provider Note:  This document was prepared using Dragon voice recognition software and may include unintentional dictation errors.

## 2023-06-13 NOTE — Progress Notes (Signed)
  Progress Note   Patient: Tiffany Martinez ZOX:[id number] DOB: 1934/04/29 DOA: 06/11/2023     2 DOS: the patient was seen and examined on 06/13/2023   Brief hospital course: Tiffany Martinez is a 89 year old female with history of hypertension, CLL, atrial fibrillation on Eliquis, CKD stage IIIb, chronic mesenteric ischemia, PAD, anxiety, who presents to the emergency department for chief concerns of called back due to abnormal blood cultures. Blood culture on 06/06/2023 showed gram-positive rods in 1 bottle, repeated culture on 1/25 still positive for same bacteria.  ID consult obtained, patient was treated with Rocephin.   Principal Problem:   Gram-positive bacteremia Active Problems:   Hyperlipidemia   Chronic anemia   Essential hypertension, benign   Peripheral arterial disease (HCC)   Aortic atherosclerosis (HCC)   CLL (chronic lymphocytic leukemia) (HCC)   COPD (chronic obstructive pulmonary disease) (HCC)   Insomnia   Paroxysmal atrial fibrillation (HCC)   Status post hip hemiarthroplasty   Bacteremia   CKD stage 3b, GFR 30-44 ml/min (HCC)   Assessment and Plan: * Gram-positive bacteremia CLL. Etiology still unclear, patient has a full mouth denture, no recent dental procedure.  Currently, patient is asymptomatic.  Skin has no evidence of infection or cellulitis, right upper quadrant has no tenderness.  No tenderness in spine.  No joint pain or swelling. Blood culture and repeat blood culture all positive, reflecting true bacteremia. ID consult obtained, continue Rocephin for now.  CKD stage 3b, GFR 30-44 ml/min (HCC) At baseline  Paroxysmal atrial fibrillation (HCC) Restart home medicines.  Essential hypertension, benign Resume home treatment.       Subjective:  Patient has no symptoms, could not tolerate oral tablet.  But no nausea vomiting prior to taking potassium pills.  No diarrhea.  No dysuria or hematuria.  No joint pain.  Never had any fever or  chills.  Physical Exam: Vitals:   06/13/23 0630 06/13/23 0730 06/13/23 0900 06/13/23 1011  BP: (!) 170/34 (!) 158/44  (!) 159/102  Pulse: 82 68 73 80  Resp: (!) 21 17 13 20   Temp:      TempSrc:      SpO2: 96% 97% 100% 100%  Weight:      Height:       General exam: Appears calm and comfortable  Respiratory system: Clear to auscultation. Respiratory effort normal. Cardiovascular system: S1 & S2 heard, RRR. No JVD, murmurs, rubs, gallops or clicks. No pedal edema. Gastrointestinal system: Abdomen is nondistended, soft and nontender. No organomegaly or masses felt. Normal bowel sounds heard. Central nervous system: Alert and oriented. No focal neurological deficits. Extremities: Symmetric 5 x 5 power. Skin: No rashes, lesions or ulcers Psychiatry: Judgement and insight appear normal. Mood & affect appropriate.    Data Reviewed:  Chest x-ray and lab results reviewed.  Family Communication: None listed.  Disposition: Status is: Inpatient Remains inpatient appropriate because: Severity of disease, IV treatment.     Time spent: 35 minutes  Author: Marrion Coy, MD 06/13/2023 11:35 AM  For on call review [email or web address].

## 2023-06-14 DIAGNOSIS — R7881 Bacteremia: Secondary | ICD-10-CM | POA: Diagnosis not present

## 2023-06-14 DIAGNOSIS — B9689 Other specified bacterial agents as the cause of diseases classified elsewhere: Secondary | ICD-10-CM | POA: Diagnosis not present

## 2023-06-14 DIAGNOSIS — C9110 Chronic lymphocytic leukemia of B-cell type not having achieved remission: Secondary | ICD-10-CM | POA: Diagnosis not present

## 2023-06-14 DIAGNOSIS — N1832 Chronic kidney disease, stage 3b: Secondary | ICD-10-CM | POA: Diagnosis not present

## 2023-06-14 LAB — CULTURE, BLOOD (ROUTINE X 2)
Special Requests: ADEQUATE
Special Requests: ADEQUATE

## 2023-06-14 LAB — CBC
HCT: 27.4 % — ABNORMAL LOW (ref 36.0–46.0)
Hemoglobin: 9.5 g/dL — ABNORMAL LOW (ref 12.0–15.0)
MCH: 35.4 pg — ABNORMAL HIGH (ref 26.0–34.0)
MCHC: 34.7 g/dL (ref 30.0–36.0)
MCV: 102.2 fL — ABNORMAL HIGH (ref 80.0–100.0)
Platelets: 93 10*3/uL — ABNORMAL LOW (ref 150–400)
RBC: 2.68 MIL/uL — ABNORMAL LOW (ref 3.87–5.11)
RDW: 14.6 % (ref 11.5–15.5)
WBC: 16.9 10*3/uL — ABNORMAL HIGH (ref 4.0–10.5)
nRBC: 0 % (ref 0.0–0.2)

## 2023-06-14 LAB — BASIC METABOLIC PANEL
Anion gap: 7 (ref 5–15)
BUN: 20 mg/dL (ref 8–23)
CO2: 23 mmol/L (ref 22–32)
Calcium: 9.3 mg/dL (ref 8.9–10.3)
Chloride: 108 mmol/L (ref 98–111)
Creatinine, Ser: 1.34 mg/dL — ABNORMAL HIGH (ref 0.44–1.00)
GFR, Estimated: 38 mL/min — ABNORMAL LOW (ref >60)
Glucose, Bld: 104 mg/dL — ABNORMAL HIGH (ref 70–99)
Potassium: 3.5 mmol/L (ref 3.5–5.1)
Sodium: 138 mmol/L (ref 135–145)

## 2023-06-14 LAB — MAGNESIUM: Magnesium: 2.1 mg/dL (ref 1.7–2.4)

## 2023-06-14 MED ORDER — SODIUM CHLORIDE 0.9 % IV SOLN: INTRAVENOUS | Status: DC

## 2023-06-14 MED ORDER — METOPROLOL SUCCINATE ER 25 MG PO TB24
12.5000 mg | ORAL_TABLET | Freq: Every day | ORAL | Status: DC
  Administered 2023-06-14 – 2023-06-16 (×3): 12.5 mg via ORAL
  Filled 2023-06-14 (×3): qty 1

## 2023-06-14 MED ORDER — LOSARTAN POTASSIUM 50 MG PO TABS
100.0000 mg | ORAL_TABLET | Freq: Every day | ORAL | Status: DC
  Administered 2023-06-14 – 2023-06-16 (×3): 100 mg via ORAL
  Filled 2023-06-14 (×3): qty 2

## 2023-06-14 NOTE — Progress Notes (Signed)
1/28 Patient says that signature is not her signature on the IMM Letter, it does not resemble her hand-writing.

## 2023-06-14 NOTE — Progress Notes (Signed)
  Progress Note   Patient: Tiffany Martinez ZOX:[id number] DOB: [birth year] DOA: 06/11/2023     3 DOS: the patient was seen and examined on 06/14/2023   Brief hospital course: [name] is a 89 year old female with history of hypertension, CLL, atrial fibrillation on Eliquis, CKD stage IIIb, chronic mesenteric ischemia, PAD, anxiety, who presents to the emergency department for chief concerns of called back due to abnormal blood cultures. Blood culture on 06/06/2023 showed gram-positive rods in 1 bottle, repeated culture on 1/25 still positive for same bacteria.  Patient was identified as a Clostridium Ramosum. Patient is seen by ID, placed on Unasyn. Patient also had history of aortic artery graft, CT angiogram of the chest did not show any evidence of infection.  TEE is scheduled for 1/29.   Principal Problem:   Gram-positive bacteremia Active Problems:   Hyperlipidemia   Chronic anemia   Essential hypertension, benign   Peripheral arterial disease (HCC)   Aortic atherosclerosis (HCC)   CLL (chronic lymphocytic leukemia) (HCC)   COPD (chronic obstructive pulmonary disease) (HCC)   Insomnia   Paroxysmal atrial fibrillation (HCC)   Status post hip hemiarthroplasty   Bacteremia   CKD stage 3b, GFR 30-44 ml/min (HCC)   Assessment and Plan:  Gram-positive bacteremia CLL. Etiology still unclear, patient has a full mouth denture, no recent dental procedure.  Currently, patient is asymptomatic.  Skin has no evidence of infection or cellulitis, right upper quadrant has no tenderness.  No tenderness in spine.  No joint pain or swelling. Blood culture and repeat blood culture all positive, reflecting true bacteremia. It was identified as Clostridium Ramosum, antibiotic switched to Unasyn per ID. CT angiogram did not show any evidence of infection from aortic artery graft. TEE scheduled for tomorrow to rule out endocarditis.    CKD stage 3b, GFR 30-44 ml/min (HCC) At baseline    Paroxysmal atrial fibrillation (HCC) Restart home medicines.   Essential hypertension, benign Resume home treatment.      Subjective:  Patient was nauseated today, had some chills.  No vomiting.  Physical Exam: Vitals:   06/13/23 2017 06/14/23 0000 06/14/23 0400 06/14/23 1120  BP: (!) 169/87 (!) 158/53 (!) 153/53 (!) 175/69  Pulse: 99 72 72 70  Resp:  16 15   Temp: 98.9 F (37.2 C) 98.5 F (36.9 C) 98.2 F (36.8 C) 98.2 F (36.8 C)  TempSrc:  Oral Oral Oral  SpO2: 99% 96% 95% 99%  Weight:      Height:       General exam: Appears calm and comfortable  Respiratory system: Clear to auscultation. Respiratory effort normal. Cardiovascular system: S1 & S2 heard, RRR. No JVD, murmurs, rubs, gallops or clicks. No pedal edema. Gastrointestinal system: Abdomen is nondistended, soft and nontender. No organomegaly or masses felt. Normal bowel sounds heard. Central nervous system: Alert and oriented. No focal neurological deficits. Extremities: Symmetric 5 x 5 power. Skin: No rashes, lesions or ulcers Psychiatry: Judgement and insight appear normal. Mood & affect appropriate.    Data Reviewed:  CT scan and lab results reviewed.  Family Communication: None  Disposition: Status is: Inpatient Remains inpatient appropriate because: Severity of disease, IV treatment.     Time spent: 35 minutes  Author: Marrion Coy, MD 06/14/2023 2:26 PM  For on call review [email or web address].

## 2023-06-14 NOTE — Care Management Important Message (Signed)
Important Message  Patient Details  Name: Tiffany Martinez MRN: [id number] Date of Birth: [birth year]   Important Message Given:  Yes - Medicare IM     Sherilyn Banker 06/14/2023, 11:00 AM

## 2023-06-14 NOTE — TOC Initial Note (Signed)
Transition of Care [facility]) - Initial/Assessment Note    Patient Details  Name: Tiffany Martinez MRN: [id number] Date of Birth: 1933-08-14  Transition of Care [facility]) CM/SW Contact:    Truddie Hidden, RN Phone Number: 06/14/2023, 9:35 AM  Clinical Narrative:                 TOC continuing to follow patient's progress throughout discharge planning.        Patient Goals and CMS Choice            Expected Discharge Plan and Services                                              Prior Living Arrangements/Services                       Activities of Daily Living      Permission Sought/Granted                  Emotional Assessment              Admission diagnosis:  Bacteremia [R78.81] Syncope, unspecified syncope type [R55] Patient Active Problem List   Diagnosis Date Noted   Bacteremia 06/11/2023   Gram-positive bacteremia 06/11/2023   CKD stage 3b, GFR 30-44 ml/min (HCC) 06/11/2023   Paroxysmal atrial fibrillation (HCC) 06/10/2022   DOE (dyspnea on exertion) 12/31/2021   Palpitations 12/31/2021   Gastroenteritis 06/15/2021   Skin neoplasm 04/24/2021   Closed nondisplaced fracture of lateral malleolus of right fibula 11/26/2019   Status post hip hemiarthroplasty 10/12/2019   Closed displaced midcervical fracture of right femur (HCC) 10/09/2019   Secondary hyperparathyroidism of renal origin (HCC) 06/30/2018   Insomnia 02/02/2018   COPD (chronic obstructive pulmonary disease) (HCC) 12/26/2017   Chronic renal disease, stage IV (HCC) 12/26/2017   Aortic atherosclerosis (HCC) 09/14/2017   CLL (chronic lymphocytic leukemia) (HCC) 09/14/2017   Chronic diastolic heart failure (HCC) 08/25/2017   Mesenteric ischemia (HCC) 09/16/2016   Advanced directives, counseling/discussion 01/15/2014   AAA (abdominal aortic aneurysm)    Routine general medical examination at a health care facility 07/07/2012   History of melanoma 12/24/2011    Peripheral arterial disease (HCC) 12/21/2010   History of squamous cell carcinoma of skin 09/28/2010   Chronic anemia 04/02/2010   Venous (peripheral) insufficiency 04/02/2010   Hyperlipidemia 06/23/2009   Episodic mood disorder (HCC) 06/23/2009   Essential hypertension, benign 06/23/2009   PCP:  Earnestine Mealing, MD Pharmacy:   [facility] DELIVERY - [address] [place] - [address] [place] [address] Phone: [phone] Fax: [phone]  [facility] - [place], [place] - [address] [place] [address] Phone: [phone] Fax: [phone]     Social Drivers of Health (SDOH) Social History: SDOH Screenings   Food Insecurity: No Food Insecurity (06/13/2023)  Housing: Unknown (06/14/2023)  Depression (PHQ2-9): Low Risk  (04/08/2023)  Tobacco Use: High Risk (06/11/2023)   SDOH Interventions:     Readmission Risk Interventions     No data to display

## 2023-06-14 NOTE — Plan of Care (Signed)

## 2023-06-14 NOTE — Progress Notes (Signed)
Date of Admission:  06/11/2023   Total days of antibiotics ***        Day ***        Day ***        Day ***   ID: Tkai Tiffany Martinez is a 89 y.o. female with  *** Principal Problem:   Gram-positive bacteremia Active Problems:   Hyperlipidemia   Chronic anemia   Essential hypertension, benign   Peripheral arterial disease (HCC)   Aortic atherosclerosis (HCC)   CLL (chronic lymphocytic leukemia) (HCC)   COPD (chronic obstructive pulmonary disease) (HCC)   Insomnia   Paroxysmal atrial fibrillation (HCC)   Status post hip hemiarthroplasty   Bacteremia   CKD stage 3b, GFR 30-44 ml/min (HCC)    Subjective: ***  Medications:   amLODipine  10 mg Oral Daily   apixaban  2.5 mg Oral BID   losartan  100 mg Oral Daily   metoprolol succinate  12.5 mg Oral Daily    Objective: Vital signs in last 24 hours: Patient Vitals for the past 24 hrs:  BP Temp Temp src Pulse Resp SpO2  06/14/23 1120 (!) 175/69 98.2 F (36.8 C) Oral 70 -- 99 %  06/14/23 0400 (!) 153/53 98.2 F (36.8 C) Oral 72 15 95 %  06/14/23 0000 (!) 158/53 98.5 F (36.9 C) Oral 72 16 96 %  06/13/23 2017 (!) 169/87 98.9 F (37.2 C) -- 99 -- 99 %  06/13/23 1828 (!) 179/57 -- -- 76 -- --  06/13/23 1635 (!) 174/64 97.8 F (36.6 C) Oral 87 20 98 %  06/13/23 1502 (!) 174/56 -- -- -- -- --  06/13/23 1457 (!) 174/56 -- -- 71 16 100 %     LDA Foley Central lines Other catheters  PHYSICAL EXAM:  General: Alert, cooperative, no distress, appears stated age.  Head: Normocephalic, without obvious abnormality, atraumatic. Eyes: Conjunctivae clear, anicteric sclerae. Pupils are equal ENT Nares normal. No drainage or sinus tenderness. Lips, mucosa, and tongue normal. No Thrush Neck: Supple, symmetrical, no adenopathy, thyroid: non tender no carotid bruit and no JVD. Back: No CVA tenderness. Lungs: Clear to auscultation bilaterally. No Wheezing or Rhonchi. No rales. Heart: Regular rate and rhythm, no murmur, rub or  gallop. Abdomen: Soft, non-tender,not distended. Bowel sounds normal. No masses Extremities: atraumatic, no cyanosis. No edema. No clubbing Skin: No rashes or lesions. Or bruising Lymph: Cervical, supraclavicular normal. Neurologic: Grossly non-focal  Lab Results    Latest Ref Rng & Units 06/14/2023    3:51 AM 06/13/2023    5:00 AM 06/12/2023    5:07 AM  CBC  WBC 4.0 - 10.5 K/uL 16.9  21.3  19.8   Hemoglobin 12.0 - 15.0 g/dL 9.5  11.9  9.7   Hematocrit 36.0 - 46.0 % 27.4  30.0  29.4   Platelets 150 - 400 K/uL 93  93  98        Latest Ref Rng & Units 06/14/2023    3:51 AM 06/13/2023    5:00 AM 06/12/2023    5:07 AM  CMP  Glucose 70 - 99 mg/dL 147  99  96   BUN 8 - 23 mg/dL 20  24  27    Creatinine 0.44 - 1.00 mg/dL 8.29  5.62  1.30   Sodium 135 - 145 mmol/L 138  140  138   Potassium 3.5 - 5.1 mmol/L 3.5  3.4  4.0   Chloride 98 - 111 mmol/L 108  112  113   CO2 22 -  32 mmol/L 23  19  19    Calcium 8.9 - 10.3 mg/dL 9.3  9.5  8.9       Microbiology:  Studies/Results: CT Angio Chest/Abd/Pel for Dissection W and/or W/WO Result Date: 06/13/2023 CLINICAL DATA:  Aortic aneurysm suspected.  Aortic graft infection. EXAM: CT ANGIOGRAPHY CHEST, ABDOMEN AND PELVIS TECHNIQUE: Non-contrast CT of the chest was initially obtained. Multidetector CT imaging through the chest, abdomen and pelvis was performed using the standard protocol during bolus administration of intravenous contrast. Multiplanar reconstructed images and MIPs were obtained and reviewed to evaluate the vascular anatomy. RADIATION DOSE REDUCTION: This exam was performed according to the departmental dose-optimization program which includes automated exposure control, adjustment of the mA and/or kV according to patient size and/or use of iterative reconstruction technique. CONTRAST:  75mL OMNIPAQUE IOHEXOL 350 MG/ML SOLN COMPARISON:  CT of the abdomen pelvis dated 01/10/2022. FINDINGS: Evaluation is limited due to streak artifact caused  by right hip arthroplasty. CTA CHEST FINDINGS Cardiovascular: There is no cardiomegaly or pericardial effusion. Three-vessel coronary vascular calcification and calcification of the mitral annulus. Advanced atherosclerotic calcification of the thoracic aorta. No aneurysmal dilatation or dissection. The origins of the great vessels of the aortic arch appear patent. No pulmonary artery embolus identified. Mediastinum/Nodes: No hilar or mediastinal adenopathy. The esophagus is grossly unremarkable. No mediastinal fluid collection. Lungs/Pleura: Bibasilar linear atelectasis/scarring. No focal consolidation, pleural effusion, or pneumothorax. The central airways are patent. Musculoskeletal: No acute osseous pathology. Review of the MIP images confirms the above findings. CTA ABDOMEN AND PELVIS FINDINGS VASCULAR Aorta: Advanced aortoiliac atherosclerotic disease. Status post prior aorto bi iliac endovascular stent graft repair of infrarenal abdominal aortic aneurysm. The stent graft appears patent. No contrast noted outside of the confines of the stent graft. No periaortic fluid collection. No significant interval change in the size of the excluded aneurysm sac. 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 The celiac artery remains patent. SMA: Advanced atherosclerotic calcification and high-grade stenosis of the origin of the SMA. The SMA appears patent. Renals: There is atherosclerotic calcification of the origins of the renal arteries. The left renal artery is widely patent. There is narrowing of the right renal artery with diminished flow likely secondary to high-grade stenosis at the origin. This is similar to prior CT. IMA: The IMA is not visualized and appears occluded. Inflow: Advanced atherosclerotic calcification of the iliac arteries. No aneurysmal dilatation or dissection. The external iliac arteries appear patent. There is diminished flow in the  internal iliac arteries. Veins: No obvious venous abnormality within the limitations of this arterial phase study. Review of the MIP images confirms the above findings. NON-VASCULAR No intra-abdominal free air or free fluid. Hepatobiliary: The liver is unremarkable. No biliary dilatation. Layering small stones in the gallbladder. No pericholecystic fluid or evidence of acute cholecystitis by CT. Pancreas: Unremarkable. No pancreatic ductal dilatation or surrounding inflammatory changes. Spleen: Normal in size without focal abnormality. Adrenals/Urinary Tract: The adrenal glands unremarkable. Moderate to severe right renal parenchyma atrophy. Multiple left renal cysts and smaller hypodense lesions which are too small to characterize. There is no hydronephrosis. The visualized ureters and urinary bladder appear unremarkable. Stomach/Bowel: There is sigmoid diverticulosis. No bowel obstruction or active inflammation. The appendix is normal. Lymphatic: No adenopathy. Reproductive: Calcified uterine fibroid. A 4.3 x 3.0 cm ovoid lesion in the right hemipelvis is not characterized but likely an exophytic and partially calcified fibroid. Ultrasound may provide better evaluation of the pelvic structures. This  is however similar to prior CT. Other: None Musculoskeletal: Total right hip arthroplasty. Osteopenia with degenerative changes of the spine. No acute osseous pathology. Review of the MIP images confirms the above findings. IMPRESSION: 1. No acute intrathoracic, abdominal, or pelvic pathology. 2. Status post prior aorto bi iliac endovascular stent graft repair of infrarenal abdominal aortic aneurysm. The stent graft appears patent. No periaortic fluid collection. No significant interval change in the size of the excluded aneurysm sac. 3. Sigmoid diverticulosis. No bowel obstruction. Normal appendix. 4. Cholelithiasis. 5. Moderate to severe right renal parenchyma atrophy. 6.  Aortic Atherosclerosis (ICD10-I70.0).  Electronically Signed   By: Elgie Collard M.D.   On: 06/13/2023 16:36     Assessment/Plan:  Clostridium ramosum bacteremia- this is a GI organism- rare but  associated with intraabdominal infections-  She has infrarenal aortic aneurysm and has had endovascular aneurysm repair in 2013 9 EVAR- aortic Bi-iliac  stent/graft  Need to make sure that this is not infected   She has chronic mesenteric ischemia - case report of this bacteria being associated with this condition and causing bacteremia is known   She has CLL and is immune compromised which could be put her at risk   Could she have GI malignancy?     Anemia   She needs  CT abdomen/CTA to look for aortitis TEE to r/o endocardiits ? Colonoscopy ( which she is not keen) Pt currently on ceftriaxone- change to Unasyn     Near syncope Would recommend getitng cardiology involved Htn she is on high dose losartan, metoprolol, amlodipine   Afib- on eliquis   Falls- causing fractures in the past 4 years Rt hip- has THA Wrist fractures Pubic bone fracture   Current smoker 1 glass redwime every day

## 2023-06-15 ENCOUNTER — Encounter
Admission: EM | Disposition: A | Discharge status: Home / Self Care | Payer: Self-pay | Source: Home / Self Care | Attending: Internal Medicine

## 2023-06-15 ENCOUNTER — Inpatient Hospital Stay: Payer: Self-pay

## 2023-06-15 ENCOUNTER — Other Ambulatory Visit: Payer: Medicare Other

## 2023-06-15 ENCOUNTER — Inpatient Hospital Stay: Payer: Medicare Other

## 2023-06-15 ENCOUNTER — Encounter: Payer: Self-pay | Admitting: Internal Medicine

## 2023-06-15 ENCOUNTER — Inpatient Hospital Stay (HOSPITAL_COMMUNITY)
Admit: 2023-06-15 | Discharge: 2023-06-15 | Disposition: A | Discharge status: Home / Self Care | Payer: Medicare Other | Attending: Cardiovascular Disease | Admitting: Cardiovascular Disease

## 2023-06-15 DIAGNOSIS — R7881 Bacteremia: Secondary | ICD-10-CM | POA: Diagnosis not present

## 2023-06-15 DIAGNOSIS — I340 Nonrheumatic mitral (valve) insufficiency: Secondary | ICD-10-CM

## 2023-06-15 DIAGNOSIS — A048 Other specified bacterial intestinal infections: Secondary | ICD-10-CM

## 2023-06-15 DIAGNOSIS — B9689 Other specified bacterial agents as the cause of diseases classified elsewhere: Secondary | ICD-10-CM | POA: Diagnosis not present

## 2023-06-15 DIAGNOSIS — I38 Endocarditis, valve unspecified: Secondary | ICD-10-CM

## 2023-06-15 DIAGNOSIS — R55 Syncope and collapse: Secondary | ICD-10-CM

## 2023-06-15 HISTORY — PX: TEE WITHOUT CARDIOVERSION: SHX5443

## 2023-06-15 LAB — BASIC METABOLIC PANEL
Anion gap: 11 (ref 5–15)
BUN: 28 mg/dL — ABNORMAL HIGH (ref 8–23)
CO2: 22 mmol/L (ref 22–32)
Calcium: 9.3 mg/dL (ref 8.9–10.3)
Chloride: 106 mmol/L (ref 98–111)
Creatinine, Ser: 1.65 mg/dL — ABNORMAL HIGH (ref 0.44–1.00)
GFR, Estimated: 30 mL/min — ABNORMAL LOW (ref >60)
Glucose, Bld: 94 mg/dL (ref 70–99)
Potassium: 3.6 mmol/L (ref 3.5–5.1)
Sodium: 139 mmol/L (ref 135–145)

## 2023-06-15 LAB — RETICULOCYTES
Immature Retic Fract: 13.4 % (ref 2.3–15.9)
RBC.: 2.55 MIL/uL — ABNORMAL LOW (ref 3.87–5.11)
Retic Count, Absolute: 43.4 10*3/uL (ref 19.0–186.0)
Retic Ct Pct: 1.7 % (ref 0.4–3.1)

## 2023-06-15 LAB — CULTURE, BLOOD (ROUTINE X 2): Culture: NO GROWTH

## 2023-06-15 LAB — CBC
HCT: 26.9 % — ABNORMAL LOW (ref 36.0–46.0)
Hemoglobin: 9.1 g/dL — ABNORMAL LOW (ref 12.0–15.0)
MCH: 34.9 pg — ABNORMAL HIGH (ref 26.0–34.0)
MCHC: 33.8 g/dL (ref 30.0–36.0)
MCV: 103.1 fL — ABNORMAL HIGH (ref 80.0–100.0)
Platelets: 84 10*3/uL — ABNORMAL LOW (ref 150–400)
RBC: 2.61 MIL/uL — ABNORMAL LOW (ref 3.87–5.11)
RDW: 14.6 % (ref 11.5–15.5)
WBC: 15.9 10*3/uL — ABNORMAL HIGH (ref 4.0–10.5)
nRBC: 0 % (ref 0.0–0.2)

## 2023-06-15 LAB — GLUCOSE, CAPILLARY: Glucose-Capillary: 90 mg/dL (ref 70–99)

## 2023-06-15 LAB — ECHO TEE

## 2023-06-15 LAB — IRON AND TIBC
Iron: 40 ug/dL (ref 28–170)
Saturation Ratios: 16 % (ref 10.4–31.8)
TIBC: 248 ug/dL — ABNORMAL LOW (ref 250–450)
UIBC: 208 ug/dL

## 2023-06-15 LAB — MAGNESIUM: Magnesium: 2.1 mg/dL (ref 1.7–2.4)

## 2023-06-15 LAB — FERRITIN: Ferritin: 99 ng/mL (ref 11–307)

## 2023-06-15 SURGERY — INTRAOPERATIVE TRANSESOPHAGEAL ECHOCARDIOGRAM
Anesthesia: Moderate Sedation

## 2023-06-15 SURGERY — TRANSESOPHAGEAL ECHOCARDIOGRAM (TEE)
Anesthesia: Moderate Sedation

## 2023-06-15 MED ORDER — FLUDEOXYGLUCOSE F - 18 (FDG) INJECTION
7.0000 | Freq: Once | INTRAVENOUS | Status: AC | PRN
  Administered 2023-06-15: 6.77 via INTRAVENOUS

## 2023-06-15 MED ORDER — PEG 3350-KCL-NA BICARB-NACL 420 G PO SOLR
4000.0000 mL | Freq: Once | ORAL | Status: DC
  Filled 2023-06-15: qty 4000

## 2023-06-15 MED ORDER — FENTANYL CITRATE (PF) 100 MCG/2ML IJ SOLN
INTRAMUSCULAR | Status: AC
  Filled 2023-06-15: qty 2

## 2023-06-15 MED ORDER — MIDAZOLAM HCL 5 MG/5ML IJ SOLN
INTRAMUSCULAR | Status: AC | PRN
  Administered 2023-06-15: .5 mg via INTRAVENOUS

## 2023-06-15 MED ORDER — FENTANYL CITRATE (PF) 100 MCG/2ML IJ SOLN
INTRAMUSCULAR | Status: AC | PRN
  Administered 2023-06-15: 50 ug via INTRAVENOUS
  Administered 2023-06-15: 25 ug via INTRAVENOUS

## 2023-06-15 MED ORDER — LIDOCAINE VISCOUS HCL 2 % MT SOLN
OROMUCOSAL | Status: AC
  Filled 2023-06-15: qty 15

## 2023-06-15 MED ORDER — MIDAZOLAM HCL 2 MG/2ML IJ SOLN
INTRAMUSCULAR | Status: AC | PRN
  Administered 2023-06-15: 2 mg via INTRAVENOUS

## 2023-06-15 MED ORDER — MIDAZOLAM HCL 2 MG/2ML IJ SOLN
INTRAMUSCULAR | Status: AC
  Filled 2023-06-15: qty 4

## 2023-06-15 MED ORDER — BUTAMBEN-TETRACAINE-BENZOCAINE 2-2-14 % EX AERO
INHALATION_SPRAY | CUTANEOUS | Status: AC
  Filled 2023-06-15: qty 5

## 2023-06-15 MED ORDER — HEPARIN SODIUM (PORCINE) 5000 UNIT/ML IJ SOLN: 5000.0000 [IU] | Freq: Three times a day (TID) | INTRAMUSCULAR | Status: DC

## 2023-06-15 NOTE — Progress Notes (Signed)
Patient alert and oriented x 4. Patient transferred to 2 C, room 230. Report given to oncoming Nurse.

## 2023-06-15 NOTE — Progress Notes (Signed)
Transesophageal Echocardiogram :  Indication: bacteremia, r/o endocarditis Requesting/ordering  physician:   Procedure: Benzocaine spray x2 and 2 mls x 2 of viscous lidocaine were given orally to provide local anesthesia to the oropharynx. The patient was positioned supine on the left side, bite block provided. The patient was moderately sedated with the doses of versed and fentanyl as detailed below.  Using digital technique an omniplane probe was advanced into the distal esophagus without incident.   Moderate sedation: 1. Sedation used:  Versed: 3.5, Fentanyl: 87.5 micrograms IV 2. Time administered: 8 Am    Time when patient started recovery: 8:30 Am Total sedation time 30 min 3. I was face to face during this time  See report in EPIC  for complete details: In brief,  No valve vegetation noted transgastric imaging revealed normal LV function with no RWMAs and no mural apical thrombus.  .  Estimated ejection fraction was 55%.  Right sided cardiac chambers were normal with no evidence of pulmonary hypertension.  Imaging of the septum showed no ASD or VSD Bubble study was negative for shunt 2D and color flow confirmed no PFO  The LA was well visualized in orthogonal views.  There was no spontaneous contrast and no thrombus in the LA and LA appendage   The descending thoracic aorta had no  mural aortic debris with no evidence of aneurysmal dilation or disection  Please see TEE report for complete details  Julien Nordmann 06/15/2023 8:43 AM

## 2023-06-15 NOTE — Progress Notes (Addendum)
1640 Patient expressed concerns and feelings of overwhelm by the whole process of trying to find the source of infection and potential for cancer. She requested to cancel the colonoscopy scheduled for tomorrow and has asked about the earliest she can leave the hospital. Hospitalist and GI MD made aware - inpatient colonoscopy cancelled. Patient is aware of the potential to self schedule a colonoscopy outpatient,.  Explained to patient that because she is still actively getting antibiotic treatment for her bacteremia, her prognosis is poor if she leaves before that has resolved/repeat blood cultures are done and read. Patient expresses understanding and will stay in the hospital until it is safe to leave.

## 2023-06-15 NOTE — Progress Notes (Signed)
  PROGRESS NOTE    Tiffany Martinez  UJW:[id number] DOB: 03-16-1934 DOA: 06/11/2023 PCP: Earnestine Mealing, MD  [id number]-AA  LOS: 4 days   Brief hospital course:   Assessment & Plan: [name] is a 89 year old female with history of hypertension, CLL, atrial fibrillation on Eliquis, CKD stage IIIb, chronic mesenteric ischemia, PAD, anxiety, who presents to the emergency department for chief concerns of called back due to abnormal blood cultures. Blood culture on 06/06/2023 showed gram-positive rods in 1 bottle, repeated culture on 1/25 still positive for same bacteria.  Patient was identified as a Tiffany Martinez. Patient is seen by ID, placed on Unasyn. Patient also had history of aortic artery graft, CT angiogram of the chest did not show any evidence of infection.   Tiffany Martinez bacteremia - Per ID, this is a GI organism- rare but  associated with intraabdominal infections-  She has infrarenal aortic aneurysm and has had endovascular aneurysm repair in 2013 -EVAR- aortic Bi-iliac stent/graft  CTA no evidence of graft infection TEE no endocarditis --PET scan today to attempt to find source of infection   chronic mesenteric ischemia  --pt reported it's well controlled with her diet modification  CLL --hematology consult   CKD stage 3b, GFR 30-44 ml/min (HCC) At baseline   Paroxysmal atrial fibrillation (HCC) --cont Toprol --hold Eliquis   Essential hypertension, benign --cont amlodipine, losartan and Toprol  Anemia --B12 and folate not def --check iron level   DVT prophylaxis: Heparin SQ Code Status: DNR  Family Communication:  Level of care: Med-Surg Dispo:   The patient is from: home Anticipated d/c is to: home Anticipated d/c date is: 1-2 days   Subjective and Interval History:  TEE today neg for vegetation.  Plan for PET scan later today.   Objective: Vitals:   06/15/23 1301 06/15/23 1308 06/15/23 1638 06/15/23 2046  BP: (!) 156/55  (!) 161/52 (!) 159/72 (!) 157/68  Pulse: (!) 53 (!) 56 (!) 57 (!) 53  Resp: 16 16 18 20   Temp: 98.3 F (36.8 C)  (!) 97.5 F (36.4 C) 98 F (36.7 C)  TempSrc:   Oral Oral  SpO2: 95% 95% 96% 98%  Weight:      Height:        Intake/Output Summary (Last 24 hours) at 06/15/2023 2110 Last data filed at 06/15/2023 5621 Gross per 24 hour  Intake 400 ml  Output 300 ml  Net 100 ml   Filed Weights   06/11/23 1454  Weight: 61.7 kg    Examination:   Constitutional: NAD, AAOx3 HEENT: conjunctivae and lids normal, EOMI CV: No cyanosis.   RESP: normal respiratory effort, on RA Neuro: II - XII grossly intact.   Psych: Normal mood and affect.  Appropriate judgement and reason   Data Reviewed: I have personally reviewed labs and imaging studies  Time spent: 50 minutes  Darlin Priestly, MD [facility] If 7PM-7AM, please contact night-coverage 06/15/2023, 9:10 PM

## 2023-06-15 NOTE — Consult Note (Signed)
Midge Minium, MD [facility]  [address] [place], [place] [address] Phone: [phone] Fax : [phone]  Consultation  Referring Provider:     Dr. Chipper Herb Primary Care Physician:  Earnestine Mealing, MD Primary Gastroenterologist: Gentry Fitz         Reason for Consultation:     Positive blood cultures  Date of Admission:  06/11/2023 Date of Consultation:  06/15/2023         HPI:   Tiffany Martinez is a 89 y.o. female who reports that she had a colonoscopy 15 years ago.  The patient was found to have positive blood cultures.  The blood cultures were positive for Clostridium ramosum.  Due to these findings the patient was recommended to undergo a TEE, a PET scan and a colonoscopy.  The hospitalist consulted me to see the patient for colonoscopy.  The patient states that she has not had a bowel movement in 5 days and her abdominal issues have gotten better but have been chronic.  She states that she was told that it was due to mesenteric ischemia but nothing had been done for that and she read a lot about this and changed her diet and states that her symptoms have improved.  There is no report of any unexplained weight loss fevers chills nausea vomiting black stools or bloody stools.  Past Medical History:  Diagnosis Date   AAA (abdominal aortic aneurysm) (HCC) 2012   3.9cm 2012; 2.8cm 12/2021   Anemia    Anxiety    CHF (congestive heart failure) (HCC)    LVEF 60-65% in 2023   Chronic venous insufficiency    CLL (chronic lymphocytic leukemia) (HCC) 2014   Stage 0--by flow cytometry   Hx of colonoscopy    Hyperlipidemia    Hypertension    Melanoma (HCC)    Right Arm   Migraine headache    3-4X/yr   PAD (peripheral artery disease) (HCC) 2012   severe R common iliac stenosis, ABI 0.73, mid L common iliac stenosis ABI 1.1   PAD (peripheral artery disease) (HCC)    Paroxysmal atrial fibrillation (HCC) 12/2021   Wears dentures    full upper and lower    Past Surgical  History:  Procedure Laterality Date   ABDOMINAL AORTIC ANEURYSM REPAIR  02/16/12   [facility]--Dr Pattricia Boss   CARDIOVASCULAR STRESS TEST  2008   negative   CATARACT EXTRACTION W/PHACO Left 07/16/2019   Procedure: CATARACT EXTRACTION PHACO AND INTRAOCULAR LENS PLACEMENT (IOC) LEFT 8.26  00:46.6;  Surgeon: Nevada Crane, MD;  Location: [facility];  Service: Ophthalmology;  Laterality: Left;   DOBUTAMINE STRESS ECHO  11/11   negative   HIP ARTHROPLASTY Right 10/10/2019   Procedure: ARTHROPLASTY BIPOLAR HIP (HEMIARTHROPLASTY);  Surgeon: Christena Flake, MD;  Location: [facility];  Service: Orthopedics;  Laterality: Right;   KNEE ARTHROSCOPY  1998   right   MELANOMA EXCISION  11/09/10   wide excision right upper arm   SQUAMOUS CELL CARCINOMA EXCISION  02/2016   TONSILLECTOMY AND ADENOIDECTOMY  1960    Prior to Admission medications   Medication Sig Start Date End Date Taking? Authorizing Provider  amLODipine (NORVASC) 5 MG tablet Take 1 tablet (5 mg total) by mouth daily. 10/01/22  Yes Beamer, Benetta Spar, MD  apixaban (ELIQUIS) 2.5 MG TABS tablet TAKE 1 TABLET BY MOUTH TWICE DAILY 07/07/22  Yes Gollan, Tollie Pizza, MD  loratadine (CLARITIN) 10 MG tablet Take 10 mg by mouth daily.  Yes [provider]  losartan (COZAAR) 100 MG tablet Take 1 tablet (100 mg total) by mouth daily. 10/01/22  Yes Earnestine Mealing, MD  metoprolol succinate (TOPROL-XL) 25 MG 24 hr tablet Take 0.5 tablets (12.5 mg total) by mouth daily. 10/01/22  Yes Earnestine Mealing, MD  Multiple Vitamin (MULTIVITAMIN ADULT PO) Take 1 tablet by mouth daily.   Yes [provider]    Family History  Problem Relation Age of Onset   Alzheimer's disease Mother    Pancreatic cancer Father    Parkinson's disease Brother    Coronary artery disease Neg Hx    Diabetes Neg Hx    Cancer Neg Hx        breast or colon     Social History   Tobacco Use   Smoking status: Every Day    Current packs/day: 0.00    Types:  Cigarettes    Last attempt to quit: 03/17/2021    Years since quitting: 2.2    Passive exposure: Never   Smokeless tobacco: Never   Tobacco comments:    occassionally  Vaping Use   Vaping status: Never Used  Substance Use Topics   Alcohol use: Yes    Alcohol/week: 7.0 standard drinks of alcohol    Types: 7 Glasses of wine per week    Comment: regular wine with dinner   Drug use: No    Allergies as of 06/11/2023 - Review Complete 06/11/2023  Allergen Reaction Noted   Triamterene Hives 08/25/2017    Review of Systems:    All systems reviewed and negative except where noted in HPI.   Physical Exam:  Vital signs in last 24 hours: Temp:  [97.6 F (36.4 C)-98.5 F (36.9 C)] 98.3 F (36.8 C) (01/29 1301) Pulse Rate:  [53-73] 56 (01/29 1308) Resp:  [8-21] 16 (01/29 1308) BP: (135-180)/(47-100) 161/52 (01/29 1308) SpO2:  [92 %-99 %] 95 % (01/29 1308)   General:   Pleasant, cooperative in NAD Head:  Normocephalic and atraumatic. Eyes:   No icterus.   Conjunctiva pink. PERRLA. Ears:  Normal auditory acuity. Neck:  Supple; no masses or thyroidomegaly Lungs: Respirations even and unlabored. Lungs clear to auscultation bilaterally.   No wheezes, crackles, or rhonchi.  Heart:  Regular rate and rhythm;  Without murmur, clicks, rubs or gallops Abdomen:  Soft, nondistended, nontender. Normal bowel sounds. No appreciable masses or hepatomegaly.  No rebound or guarding.  Rectal:  Not performed. Msk:  Symmetrical without gross deformities.    Extremities:  Without edema, cyanosis or clubbing. Neurologic:  Alert and oriented x3;  grossly normal neurologically. Skin:  Intact without significant lesions or rashes. Cervical Nodes:  No significant cervical adenopathy. Psych:  Alert and cooperative. Normal affect.  LAB RESULTS: Recent Labs    06/13/23 0500 06/14/23 0351 06/15/23 0332  WBC 21.3* 16.9* 15.9*  HGB 10.1* 9.5* 9.1*  HCT 30.0* 27.4* 26.9*  PLT 93* 93* 84*   BMET Recent  Labs    06/13/23 0500 06/14/23 0351 06/15/23 0332  NA 140 138 139  K 3.4* 3.5 3.6  CL 112* 108 106  CO2 19* 23 22  GLUCOSE 99 104* 94  BUN 24* 20 28*  CREATININE 1.39* 1.34* 1.65*  CALCIUM 9.5 9.3 9.3   LFT No results for input(s): "PROT", "ALBUMIN", "AST", "ALT", "ALKPHOS", "BILITOT", "BILIDIR", "IBILI" in the last 72 hours. PT/INR No results for input(s): "LABPROT", "INR" in the last 72 hours.  STUDIES: CT Angio Chest/Abd/Pel for Dissection W and/or W/WO Result Date:  06/13/2023 CLINICAL DATA:  Aortic aneurysm suspected.  Aortic graft infection. EXAM: CT ANGIOGRAPHY CHEST, ABDOMEN AND PELVIS TECHNIQUE: Non-contrast CT of the chest was initially obtained. Multidetector CT imaging through the chest, abdomen and pelvis was performed using the standard protocol during bolus administration of intravenous contrast. Multiplanar reconstructed images and MIPs were obtained and reviewed to evaluate the vascular anatomy. RADIATION DOSE REDUCTION: This exam was performed according to the departmental dose-optimization program which includes automated exposure control, adjustment of the mA and/or kV according to patient size and/or use of iterative reconstruction technique. CONTRAST:  75mL OMNIPAQUE IOHEXOL 350 MG/ML SOLN COMPARISON:  CT of the abdomen pelvis dated 01/10/2022. FINDINGS: Evaluation is limited due to streak artifact caused by right hip arthroplasty. CTA CHEST FINDINGS Cardiovascular: There is no cardiomegaly or pericardial effusion. Three-vessel coronary vascular calcification and calcification of the mitral annulus. Advanced atherosclerotic calcification of the thoracic aorta. No aneurysmal dilatation or dissection. The origins of the great vessels of the aortic arch appear patent. No pulmonary artery embolus identified. Mediastinum/Nodes: No hilar or mediastinal adenopathy. The esophagus is grossly unremarkable. No mediastinal fluid collection. Lungs/Pleura: Bibasilar linear  atelectasis/scarring. No focal consolidation, pleural effusion, or pneumothorax. The central airways are patent. Musculoskeletal: No acute osseous pathology. Review of the MIP images confirms the above findings. CTA ABDOMEN AND PELVIS FINDINGS VASCULAR Aorta: Advanced aortoiliac atherosclerotic disease. Status post prior aorto bi iliac endovascular stent graft repair of infrarenal abdominal aortic aneurysm. The stent graft appears patent. No contrast noted outside of the confines of the stent graft. No periaortic fluid collection. No significant interval change in the size of the excluded aneurysm sac. 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 The celiac artery remains patent. SMA: Advanced atherosclerotic calcification and high-grade stenosis of the origin of the SMA. The SMA appears patent. Renals: There is atherosclerotic calcification of the origins of the renal arteries. The left renal artery is widely patent. There is narrowing of the right renal artery with diminished flow likely secondary to high-grade stenosis at the origin. This is similar to prior CT. IMA: The IMA is not visualized and appears occluded. Inflow: Advanced atherosclerotic calcification of the iliac arteries. No aneurysmal dilatation or dissection. The external iliac arteries appear patent. There is diminished flow in the internal iliac arteries. Veins: No obvious venous abnormality within the limitations of this arterial phase study. Review of the MIP images confirms the above findings. NON-VASCULAR No intra-abdominal free air or free fluid. Hepatobiliary: The liver is unremarkable. No biliary dilatation. Layering small stones in the gallbladder. No pericholecystic fluid or evidence of acute cholecystitis by CT. Pancreas: Unremarkable. No pancreatic ductal dilatation or surrounding inflammatory changes. Spleen: Normal in size without focal abnormality. Adrenals/Urinary Tract:  The adrenal glands unremarkable. Moderate to severe right renal parenchyma atrophy. Multiple left renal cysts and smaller hypodense lesions which are too small to characterize. There is no hydronephrosis. The visualized ureters and urinary bladder appear unremarkable. Stomach/Bowel: There is sigmoid diverticulosis. No bowel obstruction or active inflammation. The appendix is normal. Lymphatic: No adenopathy. Reproductive: Calcified uterine fibroid. A 4.3 x 3.0 cm ovoid lesion in the right hemipelvis is not characterized but likely an exophytic and partially calcified fibroid. Ultrasound may provide better evaluation of the pelvic structures. This is however similar to prior CT. Other: None Musculoskeletal: Total right hip arthroplasty. Osteopenia with degenerative changes of the spine. No acute osseous pathology. Review of the MIP images confirms the above findings. IMPRESSION: 1. No acute intrathoracic,  abdominal, or pelvic pathology. 2. Status post prior aorto bi iliac endovascular stent graft repair of infrarenal abdominal aortic aneurysm. The stent graft appears patent. No periaortic fluid collection. No significant interval change in the size of the excluded aneurysm sac. 3. Sigmoid diverticulosis. No bowel obstruction. Normal appendix. 4. Cholelithiasis. 5. Moderate to severe right renal parenchyma atrophy. 6.  Aortic Atherosclerosis (ICD10-I70.0). Electronically Signed   By: Elgie Collard M.D.   On: 06/13/2023 16:36      Impression / Plan:   Assessment: Principal Problem:   Bacteremia due to Clostridium species Active Problems:   Hyperlipidemia   Chronic anemia   Essential hypertension, benign   Peripheral arterial disease (HCC)   Aortic atherosclerosis (HCC)   CLL (chronic lymphocytic leukemia) (HCC)   COPD (chronic obstructive pulmonary disease) (HCC)   Insomnia   Paroxysmal atrial fibrillation (HCC)   Status post hip hemiarthroplasty   Bacteremia   CKD stage 3b, GFR 30-44 ml/min  (HCC)   Syncope   Endocarditis   Tiffany Martinez is a 89 y.o. y/o female with with positive blood cultures for Clostridium ramosum.  Infectious disease had recommended a colonoscopy since this can be found in a patient with colon pathology most commonly a colon cancer.  The patient has a history of CLL with COPD and has chronic anemia.  Plan:  The patient will be set up for a colonoscopy for tomorrow.  The patient has been told that she will need to drink a prep to clean out her colon before the colonoscopy.  The patient will also be started on a clear liquid diet.  The patient has been told to start the diet after she goes for her PET scan.  The patient has been explained the plan including the risks benefits and alternatives of a colonoscopy.  After discussing this patient agrees to undergo the colonoscopy.  Thank you for involving me in the care of this patient.      LOS: 4 days   Midge Minium, MD, MD. Clementeen Graham 06/15/2023, 2:05 PM,  Pager [phone] 7am-5pm  Check AMION for 5pm -7am coverage and on weekends   Note: This dictation was prepared with Dragon dictation along with smaller phrase technology. Any transcriptional errors that result from this process are unintentional.

## 2023-06-15 NOTE — Progress Notes (Signed)
Date of Admission:  06/11/2023      ID: Tiffany Martinez is a 89 y.o. female Principal Problem:   Bacteremia due to Clostridium species Active Problems:   Hyperlipidemia   Chronic anemia   Essential hypertension, benign   Peripheral arterial disease (HCC)   Aortic atherosclerosis (HCC)   CLL (chronic lymphocytic leukemia) (HCC)   COPD (chronic obstructive pulmonary disease) (HCC)   Insomnia   Paroxysmal atrial fibrillation (HCC)   Status post hip hemiarthroplasty   Bacteremia   CKD stage 3b, GFR 30-44 ml/min (HCC)   Syncope   Endocarditis  Pt not in her room has gone for PET  Medications:   amLODipine  10 mg Oral Daily   butamben-tetracaine-benzocaine       fentaNYL       lidocaine       losartan  100 mg Oral Daily   metoprolol succinate  12.5 mg Oral Daily    Objective: Vital signs in last 24 hours: Patient Vitals for the past 24 hrs:  BP Temp Temp src Pulse Resp SpO2  06/15/23 0914 (!) 135/100 97.6 F (36.4 C) Oral (!) 55 15 96 %  06/15/23 0840 (!) 151/52 -- -- 61 14 92 %  06/15/23 0830 (!) 150/47 -- -- (!) 58 (!) 8 92 %  06/15/23 0815 (!) 146/54 -- -- 61 (!) 21 92 %  06/15/23 0810 (!) 149/80 -- -- -- -- --  06/15/23 0806 (!) 169/52 -- -- 64 14 96 %  06/15/23 0800 (!) 180/55 -- -- 61 17 96 %  06/15/23 0755 (!) 168/54 -- -- 73 20 97 %  06/15/23 0704 (!) 168/54 97.9 F (36.6 C) Oral 65 17 96 %  06/15/23 0425 (!) 155/77 98.5 F (36.9 C) -- 64 17 99 %  06/14/23 2305 (!) 157/53 98.5 F (36.9 C) -- 66 17 95 %  06/14/23 2025 (!) 137/59 98.2 F (36.8 C) -- 69 19 97 %  06/14/23 1658 (!) 148/50 -- -- 68 16 94 %      Lab Results    Latest Ref Rng & Units 06/15/2023    3:32 AM 06/14/2023    3:51 AM 06/13/2023    5:00 AM  CBC  WBC 4.0 - 10.5 K/uL 15.9  16.9  21.3   Hemoglobin 12.0 - 15.0 g/dL 9.1  9.5  46.9   Hematocrit 36.0 - 46.0 % 26.9  27.4  30.0   Platelets 150 - 400 K/uL 84  93  93        Latest Ref Rng & Units 06/15/2023    3:32 AM 06/14/2023    3:51  AM 06/13/2023    5:00 AM  CMP  Glucose 70 - 99 mg/dL 94  629  99   BUN 8 - 23 mg/dL 28  20  24    Creatinine 0.44 - 1.00 mg/dL 5.28  4.13  2.44   Sodium 135 - 145 mmol/L 139  138  140   Potassium 3.5 - 5.1 mmol/L 3.6  3.5  3.4   Chloride 98 - 111 mmol/L 106  108  112   CO2 22 - 32 mmol/L 22  23  19    Calcium 8.9 - 10.3 mg/dL 9.3  9.3  9.5       Microbiology: Clostridium ramosum in blood culture from 2 different days 06/10/23 06/11/23  Studies/Results: CT Angio Chest/Abd/Pel for Dissection W and/or W/WO Result Date: 06/13/2023 CLINICAL DATA:  Aortic aneurysm suspected.  Aortic graft infection. EXAM: CT ANGIOGRAPHY CHEST,  ABDOMEN AND PELVIS TECHNIQUE: Non-contrast CT of the chest was initially obtained. Multidetector CT imaging through the chest, abdomen and pelvis was performed using the standard protocol during bolus administration of intravenous contrast. Multiplanar reconstructed images and MIPs were obtained and reviewed to evaluate the vascular anatomy. RADIATION DOSE REDUCTION: This exam was performed according to the departmental dose-optimization program which includes automated exposure control, adjustment of the mA and/or kV according to patient size and/or use of iterative reconstruction technique. CONTRAST:  75mL OMNIPAQUE IOHEXOL 350 MG/ML SOLN COMPARISON:  CT of the abdomen pelvis dated 01/10/2022. FINDINGS: Evaluation is limited due to streak artifact caused by right hip arthroplasty. CTA CHEST FINDINGS Cardiovascular: There is no cardiomegaly or pericardial effusion. Three-vessel coronary vascular calcification and calcification of the mitral annulus. Advanced atherosclerotic calcification of the thoracic aorta. No aneurysmal dilatation or dissection. The origins of the great vessels of the aortic arch appear patent. No pulmonary artery embolus identified. Mediastinum/Nodes: No hilar or mediastinal adenopathy. The esophagus is grossly unremarkable. No mediastinal fluid collection.  Lungs/Pleura: Bibasilar linear atelectasis/scarring. No focal consolidation, pleural effusion, or pneumothorax. The central airways are patent. Musculoskeletal: No acute osseous pathology. Review of the MIP images confirms the above findings. CTA ABDOMEN AND PELVIS FINDINGS VASCULAR Aorta: Advanced aortoiliac atherosclerotic disease. Status post prior aorto bi iliac endovascular stent graft repair of infrarenal abdominal aortic aneurysm. The stent graft appears patent. No contrast noted outside of the confines of the stent graft. No periaortic fluid collection. No significant interval change in the size of the excluded aneurysm sac. 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 The celiac artery remains patent. SMA: Advanced atherosclerotic calcification and high-grade stenosis of the origin of the SMA. The SMA appears patent. Renals: There is atherosclerotic calcification of the origins of the renal arteries. The left renal artery is widely patent. There is narrowing of the right renal artery with diminished flow likely secondary to high-grade stenosis at the origin. This is similar to prior CT. IMA: The IMA is not visualized and appears occluded. Inflow: Advanced atherosclerotic calcification of the iliac arteries. No aneurysmal dilatation or dissection. The external iliac arteries appear patent. There is diminished flow in the internal iliac arteries. Veins: No obvious venous abnormality within the limitations of this arterial phase study. Review of the MIP images confirms the above findings. NON-VASCULAR No intra-abdominal free air or free fluid. Hepatobiliary: The liver is unremarkable. No biliary dilatation. Layering small stones in the gallbladder. No pericholecystic fluid or evidence of acute cholecystitis by CT. Pancreas: Unremarkable. No pancreatic ductal dilatation or surrounding inflammatory changes. Spleen: Normal in size without focal  abnormality. Adrenals/Urinary Tract: The adrenal glands unremarkable. Moderate to severe right renal parenchyma atrophy. Multiple left renal cysts and smaller hypodense lesions which are too small to characterize. There is no hydronephrosis. The visualized ureters and urinary bladder appear unremarkable. Stomach/Bowel: There is sigmoid diverticulosis. No bowel obstruction or active inflammation. The appendix is normal. Lymphatic: No adenopathy. Reproductive: Calcified uterine fibroid. A 4.3 x 3.0 cm ovoid lesion in the right hemipelvis is not characterized but likely an exophytic and partially calcified fibroid. Ultrasound may provide better evaluation of the pelvic structures. This is however similar to prior CT. Other: None Musculoskeletal: Total right hip arthroplasty. Osteopenia with degenerative changes of the spine. No acute osseous pathology. Review of the MIP images confirms the above findings. IMPRESSION: 1. No acute intrathoracic, abdominal, or pelvic pathology. 2. Status post prior aorto bi iliac endovascular stent graft repair  of infrarenal abdominal aortic aneurysm. The stent graft appears patent. No periaortic fluid collection. No significant interval change in the size of the excluded aneurysm sac. 3. Sigmoid diverticulosis. No bowel obstruction. Normal appendix. 4. Cholelithiasis. 5. Moderate to severe right renal parenchyma atrophy. 6.  Aortic Atherosclerosis (ICD10-I70.0). Electronically Signed   By: Elgie Collard M.D.   On: 06/13/2023 16:36     Assessment/Plan:  Clostridium ramosum bacteremia- this is a GI organism- rare but  associated with intraabdominal infections-  She has infrarenal aortic aneurysm and has had endovascular aneurysm repair in 2013 -EVAR- aortic Bi-iliac  stent/graft  CTA no evidence of graft infection PET scan done today TEE no endocarditis   She has chronic mesenteric ischemia - case report of this bacteria being associated with this condition and causing  bacteremia is known   She has CLL and is immune compromised which could be put her at risk  recommend Heme/Onc consult  Could she have GI malignancy? Is getting colonoscopy tomorow     Anemia      Near syncope Would recommend getitng cardiology involved Htn she is on high dose losartan, metoprolol, amlodipine   Afib- on eliquis   Falls- causing fractures in the past 4 years Rt hip- has THA Wrist fractures Pubic bone fracture   Current smoker 1 glass redwine every day  Pt is not in her room

## 2023-06-16 ENCOUNTER — Encounter: Payer: Self-pay | Admitting: Cardiovascular Disease

## 2023-06-16 ENCOUNTER — Other Ambulatory Visit: Payer: Self-pay

## 2023-06-16 DIAGNOSIS — R7881 Bacteremia: Secondary | ICD-10-CM | POA: Diagnosis not present

## 2023-06-16 DIAGNOSIS — C9110 Chronic lymphocytic leukemia of B-cell type not having achieved remission: Secondary | ICD-10-CM | POA: Diagnosis not present

## 2023-06-16 DIAGNOSIS — B9689 Other specified bacterial agents as the cause of diseases classified elsewhere: Secondary | ICD-10-CM | POA: Diagnosis not present

## 2023-06-16 LAB — BASIC METABOLIC PANEL
Anion gap: 7 (ref 5–15)
BUN: 27 mg/dL — ABNORMAL HIGH (ref 8–23)
CO2: 22 mmol/L (ref 22–32)
Calcium: 9.2 mg/dL (ref 8.9–10.3)
Chloride: 111 mmol/L (ref 98–111)
Creatinine, Ser: 1.42 mg/dL — ABNORMAL HIGH (ref 0.44–1.00)
GFR, Estimated: 36 mL/min — ABNORMAL LOW (ref >60)
Glucose, Bld: 92 mg/dL (ref 70–99)
Potassium: 3.4 mmol/L — ABNORMAL LOW (ref 3.5–5.1)
Sodium: 140 mmol/L (ref 135–145)

## 2023-06-16 LAB — CBC
HCT: 26.7 % — ABNORMAL LOW (ref 36.0–46.0)
Hemoglobin: 9.1 g/dL — ABNORMAL LOW (ref 12.0–15.0)
MCH: 35.4 pg — ABNORMAL HIGH (ref 26.0–34.0)
MCHC: 34.1 g/dL (ref 30.0–36.0)
MCV: 103.9 fL — ABNORMAL HIGH (ref 80.0–100.0)
Platelets: 86 10*3/uL — ABNORMAL LOW (ref 150–400)
RBC: 2.57 MIL/uL — ABNORMAL LOW (ref 3.87–5.11)
RDW: 14.4 % (ref 11.5–15.5)
WBC: 15.7 10*3/uL — ABNORMAL HIGH (ref 4.0–10.5)
nRBC: 0 % (ref 0.0–0.2)

## 2023-06-16 LAB — MAGNESIUM: Magnesium: 2.1 mg/dL (ref 1.7–2.4)

## 2023-06-16 LAB — LACTATE DEHYDROGENASE: LDH: 129 U/L (ref 98–192)

## 2023-06-16 SURGERY — COLONOSCOPY WITH PROPOFOL
Anesthesia: General

## 2023-06-16 MED ORDER — AMOXICILLIN-POT CLAVULANATE 500-125 MG PO TABS: 1.0000 | ORAL_TABLET | Freq: Two times a day (BID) | ORAL | Status: DC

## 2023-06-16 MED ORDER — AMOXICILLIN-POT CLAVULANATE 500-125 MG PO TABS
1.0000 | ORAL_TABLET | Freq: Three times a day (TID) | ORAL | 0 refills | Status: DC
  Filled 2023-06-16: qty 21, 7d supply, fill #0

## 2023-06-16 MED ORDER — POTASSIUM CHLORIDE CRYS ER 20 MEQ PO TBCR
40.0000 meq | EXTENDED_RELEASE_TABLET | Freq: Once | ORAL | Status: AC
  Administered 2023-06-16: 40 meq via ORAL
  Filled 2023-06-16: qty 2

## 2023-06-16 NOTE — Progress Notes (Signed)
The patient informed the staff that she did not want any further workup from a GI point of view while she is in the hospital.  The patient was told yesterday that it was up to her whether she want to go through this or not and I would follow her recommendations.  Since she has decided not to undergo the colonoscopy there is nothing further to do from a GI point of view at this time.  I will sign off.  Please call if any further GI concerns or questions.  We would like to thank you for the opportunity to participate in the care of [employer].

## 2023-06-16 NOTE — Consult Note (Signed)
[facility] CONSULT NOTE  Patient Care Team: Earnestine Mealing, MD as PCP - General (Family Medicine) Mariah Milling Tollie Pizza, MD as PCP - Cardiology (Cardiology) Tonny Bollman, MD (Cardiology)  CHIEF COMPLAINTS/PURPOSE OF CONSULTATION: CLL  HISTORY OF PRESENTING ILLNESS:  Tiffany Martinez 89 y.o.  female pleasant patient with a long standing history of 'leukemia", hypertension, A-fib on Eliquis chronic kidney disease chronic mesenteric ischemia-is currently admitted to hospital after syncopal episode at a hair salon.  On evaluation in the hospital patient had extensive workup including blood cultures that was positive for Clostridium ramosus.  Patient had further extensive workup-including PET scan to look for source of infection negative.  GI was consulted for possible colonoscopy given association with colonic malignancy.  However patient declined.  Oncology has been consulted for further evaluation and recommendations for CLL.  Patient currently doing well.  She feels she is back to baseline.  She is not interested in any further workup at this time eager to go home.  Review of Systems  Constitutional:  Positive for malaise/fatigue. Negative for chills, diaphoresis, fever and weight loss.  HENT:  Negative for nosebleeds and sore throat.   Eyes:  Negative for double vision.  Respiratory:  Negative for cough, hemoptysis, sputum production, shortness of breath and wheezing.   Cardiovascular:  Negative for chest pain, palpitations, orthopnea and leg swelling.  Gastrointestinal:  Negative for abdominal pain, blood in stool, constipation, diarrhea, heartburn, melena, nausea and vomiting.  Genitourinary:  Negative for dysuria, frequency and urgency.  Musculoskeletal:  Negative for back pain and joint pain.  Skin: Negative.  Negative for itching and rash.  Neurological:  Negative for dizziness, tingling, focal weakness, weakness and headaches.  Endo/Heme/Allergies:  Does not  bruise/bleed easily.  Psychiatric/Behavioral:  Negative for depression. The patient is not nervous/anxious and does not have insomnia.     MEDICAL HISTORY:  Past Medical History:  Diagnosis Date   AAA (abdominal aortic aneurysm) (HCC) 2012   3.9cm 2012; 2.8cm 12/2021   Anemia    Anxiety    CHF (congestive heart failure) (HCC)    LVEF 60-65% in 2023   Chronic venous insufficiency    CLL (chronic lymphocytic leukemia) (HCC) 2014   Stage 0--by flow cytometry   Hx of colonoscopy    Hyperlipidemia    Hypertension    Melanoma (HCC)    Right Arm   Migraine headache    3-4X/yr   PAD (peripheral artery disease) (HCC) 2012   severe R common iliac stenosis, ABI 0.73, mid L common iliac stenosis ABI 1.1   PAD (peripheral artery disease) (HCC)    Paroxysmal atrial fibrillation (HCC) 12/2021   Wears dentures    full upper and lower    SURGICAL HISTORY: Past Surgical History:  Procedure Laterality Date   ABDOMINAL AORTIC ANEURYSM REPAIR  02/16/12   [facility]--Dr Pattricia Boss   CARDIOVASCULAR STRESS TEST  2008   negative   CATARACT EXTRACTION W/PHACO Left 07/16/2019   Procedure: CATARACT EXTRACTION PHACO AND INTRAOCULAR LENS PLACEMENT (IOC) LEFT 8.26  00:46.6;  Surgeon: Nevada Crane, MD;  Location: [facility];  Service: Ophthalmology;  Laterality: Left;   DOBUTAMINE STRESS ECHO  11/11   negative   HIP ARTHROPLASTY Right 10/10/2019   Procedure: ARTHROPLASTY BIPOLAR HIP (HEMIARTHROPLASTY);  Surgeon: Christena Flake, MD;  Location: [facility];  Service: Orthopedics;  Laterality: Right;   KNEE ARTHROSCOPY  1998   right   MELANOMA EXCISION  11/09/10   wide excision right upper arm  SQUAMOUS CELL CARCINOMA EXCISION  02/2016   TEE WITHOUT CARDIOVERSION N/A 06/15/2023   Procedure: TRANSESOPHAGEAL ECHOCARDIOGRAM (TEE);  Surgeon: Antonieta Iba, MD;  Location: [facility];  Service: Cardiovascular;  Laterality: N/A;   TONSILLECTOMY AND ADENOIDECTOMY  1960    SOCIAL HISTORY: Social History    Socioeconomic History   Marital status: Widowed    Spouse name: Not on file   Number of children: 4   Years of education: Not on file   Highest education level: Not on file  Occupational History   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,   Tobacco Use   Smoking status: Every Day    Current packs/day: 0.00    Types: Cigarettes    Last attempt to quit: 03/17/2021    Years since quitting: 2.2    Passive exposure: Never   Smokeless tobacco: Never   Tobacco comments:    occassionally  Vaping Use   Vaping status: Never Used  Substance and Sexual Activity   Alcohol use: Yes    Alcohol/week: 7.0 standard drinks of alcohol    Types: 7 Glasses of wine per week    Comment: regular wine with dinner   Drug use: No   Sexual activity: Never  Other Topics Concern   Not on file  Social History Narrative   Widowed 2/11 -2nd for him , 1st for her. 4 step sons. 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         Has living will and health care POA--changing her POA at this point   Requests DNR   Discussed MOST form---but would probably accept IV fluids/antibiotics, etc   Requests no feeding tube         Social Drivers of Health   Financial Resource Strain: Not on file  Food Insecurity: No Food Insecurity (06/13/2023)   Hunger Vital Sign    Worried About Running Out of Food in the Last Year: Never true    Ran Out of Food in the Last Year: Never true  Transportation Needs: No Transportation Needs (06/14/2023)   PRAPARE - Administrator, Civil Service (Medical): No    Lack of Transportation (Non-Medical): No  Physical Activity: Not on file  Stress: Not on file  Social Connections: Not on file  Intimate Partner Violence: Not At Risk (06/14/2023)   Humiliation, Afraid, Rape, and Kick questionnaire    Fear of Current or Ex-Partner: No    Emotionally Abused: No    Physically Abused: No    Sexually Abused: No    FAMILY HISTORY: Family History  Problem  Relation Age of Onset   Alzheimer's disease Mother    Pancreatic cancer Father    Parkinson's disease Brother    Coronary artery disease Neg Hx    Diabetes Neg Hx    Cancer Neg Hx        breast or colon    ALLERGIES:  is allergic to triamterene.  MEDICATIONS:  Current Facility-Administered Medications  Medication Dose Route Frequency Provider Last Rate Last Admin   amLODipine (NORVASC) tablet 10 mg  10 mg Oral Daily Gillis Santa, MD   10 mg at 06/16/23 0836   Ampicillin-Sulbactam (UNASYN) 3 g in sodium chloride 0.9 % 100 mL IVPB  3 g Intravenous Q12H Ravishankar, Rhodia Albright, MD 200 mL/hr at 06/16/23 1122 3 g at 06/16/23 1122   heparin injection 5,000 Units  5,000 Units Subcutaneous Q8H Darlin Priestly, MD       hydrALAZINE (APRESOLINE)  tablet 50 mg  50 mg Oral Q6H PRN Gillis Santa, MD   50 mg at 06/12/23 1530   Or   hydrALAZINE (APRESOLINE) injection 10 mg  10 mg Intravenous Q6H PRN Gillis Santa, MD   10 mg at 06/13/23 1502   losartan (COZAAR) tablet 100 mg  100 mg Oral Daily Marrion Coy, MD   100 mg at 06/16/23 1610   metoprolol succinate (TOPROL-XL) 24 hr tablet 12.5 mg  12.5 mg Oral Daily Marrion Coy, MD   12.5 mg at 06/16/23 9604   metoprolol tartrate (LOPRESSOR) injection 5 mg  5 mg Intravenous Q4H PRN Cox, Amy N, DO       polyethylene glycol-electrolytes (NuLYTELY) solution 4,000 mL  4,000 mL Oral Once Midge Minium, MD       senna-docusate (Senokot-S) tablet 1 tablet  1 tablet Oral QHS PRN Cox, Amy N, DO       Current Outpatient Medications  Medication Sig Dispense Refill   amLODipine (NORVASC) 5 MG tablet Take 1 tablet (5 mg total) by mouth daily. 90 tablet 3   apixaban (ELIQUIS) 2.5 MG TABS tablet TAKE 1 TABLET BY MOUTH TWICE DAILY 180 tablet 1   loratadine (CLARITIN) 10 MG tablet Take 10 mg by mouth daily.     losartan (COZAAR) 100 MG tablet Take 1 tablet (100 mg total) by mouth daily. 90 tablet 3   metoprolol succinate (TOPROL-XL) 25 MG 24 hr tablet Take 0.5 tablets (12.5 mg  total) by mouth daily. 90 tablet 3   Multiple Vitamin (MULTIVITAMIN ADULT PO) Take 1 tablet by mouth daily.     amoxicillin-clavulanate (AUGMENTIN) 500-125 MG tablet Take 1 tablet by mouth 2 (two) times daily for 21 doses.      PHYSICAL EXAMINATION:   Vitals:   06/16/23 0337 06/16/23 0813  BP: 116/60 (!) 149/64  Pulse: (!) 55 (!) 55  Resp: 20 16  Temp: 98.1 F (36.7 C) 98 F (36.7 C)  SpO2: 95% 98%   Filed Weights   06/11/23 1454  Weight: 136 lb (61.7 kg)    Physical Exam Vitals and nursing note reviewed.  HENT:     Head: Normocephalic and atraumatic.     Mouth/Throat:     Pharynx: Oropharynx is clear.  Eyes:     Extraocular Movements: Extraocular movements intact.     Pupils: Pupils are equal, round, and reactive to light.  Cardiovascular:     Rate and Rhythm: Normal rate and regular rhythm.  Pulmonary:     Comments: Decreased breath sounds bilaterally.  Abdominal:     Palpations: Abdomen is soft.  Musculoskeletal:        General: Normal range of motion.     Cervical back: Normal range of motion.  Skin:    General: Skin is warm.  Neurological:     General: No focal deficit present.     Mental Status: She is alert and oriented to person, place, and time.  Psychiatric:        Behavior: Behavior normal.        Judgment: Judgment normal.     LABORATORY DATA:  I have reviewed the data as listed Lab Results  Component Value Date   WBC 15.7 (H) 06/16/2023   HGB 9.1 (L) 06/16/2023   HCT 26.7 (L) 06/16/2023   MCV 103.9 (H) 06/16/2023   PLT 86 (L) 06/16/2023   Recent Labs    08/02/22 0000 12/27/22 5409 06/10/23 1427 06/11/23 1500 06/12/23 0507 06/14/23 0351 06/15/23 8119 06/16/23 1478  NA 139   < >  --  138   < > 138 139 140  K 3.9   < >  --  3.8   < > 3.5 3.6 3.4*  CL 106   < >  --  108   < > 108 106 111  CO2 27*   < >  --  23   < > 23 22 22   GLUCOSE  --    < >  --  114*   < > 104* 94 92  BUN 26*   < >  --  29*   < > 20 28* 27*  CREATININE 1.8*    < >  --  1.49*   < > 1.34* 1.65* 1.42*  CALCIUM 9.8   < >  --  9.8   < > 9.3 9.3 9.2  GFRNONAA  --    < >  --  34*   < > 38* 30* 36*  PROT  --   --  6.4* 6.7  --   --   --   --   ALBUMIN 3.9  --  3.6 4.0  --   --   --   --   AST 20  --  29 27  --   --   --   --   ALT 10  --  15 15  --   --   --   --   ALKPHOS 68  --  61 68  --   --   --   --   BILITOT  --   --  1.0 1.0  --   --   --   --   BILIDIR  --   --  0.2  --   --   --   --   --   IBILI  --   --  0.8  --   --   --   --   --    < > = values in this interval not displayed.    RADIOGRAPHIC STUDIES: I have personally reviewed the radiological images as listed and agreed with the findings in the report. NM PET Image Initial (PI) Whole Body (F-18 FDG) Addendum Date: 06/16/2023 ADDENDUM REPORT: 06/16/2023 13:35 ADDENDUM: Voice recognition error: Correction - Incidental CT findings section: Aortic bi-iliac stent graft. NO abnormal metabolic activity. Findings conveyed to Dr Macario Golds 06/16/2023  at13:00. Electronically Signed   By: Genevive Bi M.D.   On: 06/16/2023 13:35   Result Date: 06/16/2023 CLINICAL DATA:  Initial treatment strategy for bacteremia on clear source. EXAM: NUCLEAR MEDICINE PET WHOLE BODY TECHNIQUE: 6.8 mCi F-18 FDG was injected intravenously. Full-ring PET imaging was performed from the head to foot after the radiotracer. CT data was obtained and used for attenuation correction and anatomic localization. Fasting blood glucose: 90 mg/dl COMPARISON:  CT 86/57/8469 FINDINGS: HEAD/NECK: No hypermetabolic activity in the scalp. No hypermetabolic cervical lymph nodes. Incidental CT findings: none CHEST: No hypermetabolic mediastinal or hilar nodes. No suspicious pulmonary nodules on the CT scan. Incidental CT findings: Coronary artery calcification and aortic atherosclerotic calcification. No pneumonia. ABDOMEN/PELVIS: No abnormal hypermetabolic activity within the liver, pancreas, adrenal glands, or spleen. No hypermetabolic  lymph nodes in the abdomen or pelvis. Incidental CT findings: Aortic bi-iliac stent graft. Abnormal metabolic activity. Large calcified leiomyoma posterior the uterus. SKELETON: No focal hypermetabolic activity to suggest skeletal metastasis. Incidental CT findings: none EXTREMITIES: No abnormal hypermetabolic activity in the lower extremities. Incidental CT findings: none  IMPRESSION: 1. No source of infection identified by FDG PET-CT imaging. 2. No source of infection identified on the CT portion exam. Electronically Signed: By: Genevive Bi M.D. On: 06/15/2023 20:15   ECHO TEE Result Date: 06/15/2023    TRANSESOPHOGEAL ECHO REPORT   Patient Name:   Tiffany Martinez Date of Exam: 06/15/2023 Medical Rec #:  [id number]       Height:       61.0 in Accession #:    [id number]      Weight:       136.0 lb Date of Birth:  01/11/1934      BSA:          1.603 m Patient Age:    89 years        BP:           168/54 mmHg Patient Gender: F               HR:           65 bpm. Exam Location:  [facility] Procedure: Transesophageal Echo, Cardiac Doppler, Color Doppler and Saline            Contrast Bubble Study Indications:     Bacteremia R78.81  History:         Patient has prior history of Echocardiogram examinations, most                  recent 06/12/2023. CHF; Risk Factors:Dyslipidemia and                  Hypertension.  Sonographer:     Cristela Blue Referring Phys:  [id number] Antonieta Iba Diagnosing Phys: Julien Nordmann MD PROCEDURE: After discussion of the risks and benefits of a TEE, an informed consent was obtained from the patient. TEE procedure time was 30 minutes. The transesophogeal probe was passed without difficulty through the esophogus of the patient. Imaged were obtained with the patient in a left lateral decubitus position. Local oropharyngeal anesthetic was provided with Cetacaine and viscous lidocaine. Sedation performed by different physician. Image quality was excellent. The patient's vital signs; including heart  rate, blood pressure, and oxygen saturation; remained stable throughout the procedure. The patient developed no complications during the procedure.  IMPRESSIONS  1. Left ventricular ejection fraction, by estimation, is 55 to 60%. The left ventricle has normal function. The left ventricle has no regional wall motion abnormalities.  2. Right ventricular systolic function is normal. The right ventricular size is normal.  3. No left atrial/left atrial appendage thrombus was detected.  4. The mitral valve is normal in structure. Mild to moderate mitral valve regurgitation. No evidence of mitral stenosis.  5. The aortic valve is normal in structure. Aortic valve regurgitation is mild. No aortic stenosis is present.  6. The inferior vena cava is normal in size with greater than 50% respiratory variability, suggesting right atrial pressure of 3 mmHg.  7. Agitated saline contrast bubble study was negative, with no evidence of any interatrial shunt.  8. No valve vegetation noted Conclusion(s)/Recommendation(s): Normal biventricular function without evidence of hemodynamically significant valvular heart disease. FINDINGS  Left Ventricle: Left ventricular ejection fraction, by estimation, is 55 to 60%. The left ventricle has normal function. The left ventricle has no regional wall motion abnormalities. The left ventricular internal cavity size was normal in size. There is  no left ventricular hypertrophy. Right Ventricle: The right ventricular size is normal. No increase in right ventricular wall thickness. Right ventricular systolic function is normal.  Left Atrium: Left atrial size was normal in size. No left atrial/left atrial appendage thrombus was detected. Right Atrium: Right atrial size was normal in size. Pericardium: There is no evidence of pericardial effusion. Mitral Valve: The mitral valve is normal in structure. Mild to moderate mitral valve regurgitation. No evidence of mitral valve stenosis. There is no evidence  of mitral valve vegetation. Tricuspid Valve: The tricuspid valve is normal in structure. Tricuspid valve regurgitation is not demonstrated. No evidence of tricuspid stenosis. There is no evidence of tricuspid valve vegetation. Aortic Valve: The aortic valve is normal in structure. Aortic valve regurgitation is mild. No aortic stenosis is present. There is no evidence of aortic valve vegetation. Pulmonic Valve: The pulmonic valve was normal in structure. Pulmonic valve regurgitation is not visualized. No evidence of pulmonic stenosis. There is no evidence of pulmonic valve vegetation. Aorta: The aortic root is normal in size and structure. There is minimal (Grade I) atheroma plaque involving the aortic arch and descending aorta. Venous: The inferior vena cava is normal in size with greater than 50% respiratory variability, suggesting right atrial pressure of 3 mmHg. IAS/Shunts: No atrial level shunt detected by color flow Doppler. Agitated saline contrast was given intravenously to evaluate for intracardiac shunting. Agitated saline contrast bubble study was negative, with no evidence of any interatrial shunt. There  is no evidence of a patent foramen ovale. There is no evidence of an atrial septal defect. Julien Nordmann MD Electronically signed by Julien Nordmann MD Signature Date/Time: 06/15/2023/9:27:03 PM    Final    CT Angio Chest/Abd/Pel for Dissection W and/or W/WO Result Date: 06/13/2023 CLINICAL DATA:  Aortic aneurysm suspected.  Aortic graft infection. EXAM: CT ANGIOGRAPHY CHEST, ABDOMEN AND PELVIS TECHNIQUE: Non-contrast CT of the chest was initially obtained. Multidetector CT imaging through the chest, abdomen and pelvis was performed using the standard protocol during bolus administration of intravenous contrast. Multiplanar reconstructed images and MIPs were obtained and reviewed to evaluate the vascular anatomy. RADIATION DOSE REDUCTION: This exam was performed according to the departmental  dose-optimization program which includes automated exposure control, adjustment of the mA and/or kV according to patient size and/or use of iterative reconstruction technique. CONTRAST:  75mL OMNIPAQUE IOHEXOL 350 MG/ML SOLN COMPARISON:  CT of the abdomen pelvis dated 01/10/2022. FINDINGS: Evaluation is limited due to streak artifact caused by right hip arthroplasty. CTA CHEST FINDINGS Cardiovascular: There is no cardiomegaly or pericardial effusion. Three-vessel coronary vascular calcification and calcification of the mitral annulus. Advanced atherosclerotic calcification of the thoracic aorta. No aneurysmal dilatation or dissection. The origins of the great vessels of the aortic arch appear patent. No pulmonary artery embolus identified. Mediastinum/Nodes: No hilar or mediastinal adenopathy. The esophagus is grossly unremarkable. No mediastinal fluid collection. Lungs/Pleura: Bibasilar linear atelectasis/scarring. No focal consolidation, pleural effusion, or pneumothorax. The central airways are patent. Musculoskeletal: No acute osseous pathology. Review of the MIP images confirms the above findings. CTA ABDOMEN AND PELVIS FINDINGS VASCULAR Aorta: Advanced aortoiliac atherosclerotic disease. Status post prior aorto bi iliac endovascular stent graft repair of infrarenal abdominal aortic aneurysm. The stent graft appears patent. No contrast noted outside of the confines of the stent graft. No periaortic fluid collection. No significant interval change in the size of the excluded aneurysm sac. 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 The celiac artery remains patent. SMA: Advanced atherosclerotic calcification and high-grade stenosis of the origin of the SMA. The SMA appears patent. Renals: There is atherosclerotic calcification of the  origins of the renal arteries. The left renal artery is widely patent. There is narrowing of the right renal artery  with diminished flow likely secondary to high-grade stenosis at the origin. This is similar to prior CT. IMA: The IMA is not visualized and appears occluded. Inflow: Advanced atherosclerotic calcification of the iliac arteries. No aneurysmal dilatation or dissection. The external iliac arteries appear patent. There is diminished flow in the internal iliac arteries. Veins: No obvious venous abnormality within the limitations of this arterial phase study. Review of the MIP images confirms the above findings. NON-VASCULAR No intra-abdominal free air or free fluid. Hepatobiliary: The liver is unremarkable. No biliary dilatation. Layering small stones in the gallbladder. No pericholecystic fluid or evidence of acute cholecystitis by CT. Pancreas: Unremarkable. No pancreatic ductal dilatation or surrounding inflammatory changes. Spleen: Normal in size without focal abnormality. Adrenals/Urinary Tract: The adrenal glands unremarkable. Moderate to severe right renal parenchyma atrophy. Multiple left renal cysts and smaller hypodense lesions which are too small to characterize. There is no hydronephrosis. The visualized ureters and urinary bladder appear unremarkable. Stomach/Bowel: There is sigmoid diverticulosis. No bowel obstruction or active inflammation. The appendix is normal. Lymphatic: No adenopathy. Reproductive: Calcified uterine fibroid. A 4.3 x 3.0 cm ovoid lesion in the right hemipelvis is not characterized but likely an exophytic and partially calcified fibroid. Ultrasound may provide better evaluation of the pelvic structures. This is however similar to prior CT. Other: None Musculoskeletal: Total right hip arthroplasty. Osteopenia with degenerative changes of the spine. No acute osseous pathology. Review of the MIP images confirms the above findings. IMPRESSION: 1. No acute intrathoracic, abdominal, or pelvic pathology. 2. Status post prior aorto bi iliac endovascular stent graft repair of infrarenal  abdominal aortic aneurysm. The stent graft appears patent. No periaortic fluid collection. No significant interval change in the size of the excluded aneurysm sac. 3. Sigmoid diverticulosis. No bowel obstruction. Normal appendix. 4. Cholelithiasis. 5. Moderate to severe right renal parenchyma atrophy. 6.  Aortic Atherosclerosis (ICD10-I70.0). Electronically Signed   By: Elgie Collard M.D.   On: 06/13/2023 16:36   ECHOCARDIOGRAM COMPLETE Result Date: 06/12/2023    ECHOCARDIOGRAM REPORT   Patient Name:   Tiffany Martinez Date of Exam: 06/12/2023 Medical Rec #:  [id number]       Height:       61.0 in Accession #:    [id number]      Weight:       136.0 lb Date of Birth:  June 09, 1933      BSA:          1.603 m Patient Age:    89 years        BP:           170/57 mmHg Patient Gender: F               HR:           63 bpm. Exam Location:  [facility] Procedure: 2D Echo, Color Doppler and Cardiac Doppler Indications:     Bacteremia R78.81  History:         Patient has prior history of Echocardiogram examinations. CHF,                  Arrythmias:Atrial Fibrillation; Risk Factors:Hypertension.  Sonographer:     Neysa Bonito Roar Referring Phys:  [id number] AMY N COX Diagnosing Phys: Rozell Searing Custovic IMPRESSIONS  1. Left ventricular ejection fraction, by estimation, is 50 to 55%. Left ventricular ejection fraction by 2D MOD biplane is 53.8 %.  The left ventricle has low normal function. The left ventricle has no regional wall motion abnormalities. There is mild left ventricular hypertrophy. Left ventricular diastolic parameters are consistent with Grade II diastolic dysfunction (pseudonormalization).  2. Right ventricular systolic function is normal. The right ventricular size is normal.  3. Left atrial size was moderately dilated.  4. The mitral valve is normal in structure. Mild mitral valve regurgitation. No evidence of mitral stenosis.  5. The aortic valve is normal in structure. Aortic valve regurgitation is moderate. No aortic  stenosis is present. Aortic regurgitation PHT measures 453 msec.  6. The inferior vena cava is normal in size with greater than 50% respiratory variability, suggesting right atrial pressure of 3 mmHg. FINDINGS  Left Ventricle: Left ventricular ejection fraction, by estimation, is 50 to 55%. Left ventricular ejection fraction by 2D MOD biplane is 53.8 %. The left ventricle has low normal function. The left ventricle has no regional wall motion abnormalities. The left ventricular internal cavity size was normal in size. There is mild left ventricular hypertrophy. Left ventricular diastolic parameters are consistent with Grade II diastolic dysfunction (pseudonormalization). Right Ventricle: The right ventricular size is normal. No increase in right ventricular wall thickness. Right ventricular systolic function is normal. Left Atrium: Left atrial size was moderately dilated. Right Atrium: Right atrial size was normal in size. Pericardium: There is no evidence of pericardial effusion. Mitral Valve: The mitral valve is normal in structure. Mild mitral valve regurgitation. No evidence of mitral valve stenosis. MV peak gradient, 8.5 mmHg. The mean mitral valve gradient is 4.0 mmHg. Tricuspid Valve: The tricuspid valve is normal in structure. Tricuspid valve regurgitation is mild. Aortic Valve: The aortic valve is normal in structure. Aortic valve regurgitation is moderate. Aortic regurgitation PHT measures 453 msec. No aortic stenosis is present. Pulmonic Valve: The pulmonic valve was normal in structure. Pulmonic valve regurgitation is not visualized. Aorta: The aortic root is normal in size and structure. Venous: The inferior vena cava is normal in size with greater than 50% respiratory variability, suggesting right atrial pressure of 3 mmHg. IAS/Shunts: No atrial level shunt detected by color flow Doppler.  LEFT VENTRICLE PLAX 2D                        Biplane EF (MOD) LVIDd:         4.90 cm         LV Biplane EF:    Left LVIDs:         3.60 cm                          ventricular LV PW:         1.00 cm                          ejection LV IVS:        1.20 cm                          fraction by LVOT diam:     1.70 cm                          2D MOD LV SV:         62  biplane is LV SV Index:   39                               53.8 %. LVOT Area:     2.27 cm                                Diastology                                LV e' medial:    5.18 cm/s LV Volumes (MOD)               LV E/e' medial:  27.8 LV vol d, MOD    84.6 ml       LV e' lateral:   6.90 cm/s A2C:                           LV E/e' lateral: 20.9 LV vol d, MOD    83.8 ml A4C: LV vol s, MOD    36.7 ml A2C: LV vol s, MOD    39.0 ml A4C: LV SV MOD A2C:   47.9 ml LV SV MOD A4C:   83.8 ml LV SV MOD BP:    46.1 ml RIGHT VENTRICLE RV Basal diam:  3.60 cm RV Mid diam:    2.70 cm RV S prime:     11.60 cm/s TAPSE (M-mode): 2.6 cm LEFT ATRIUM             Index        RIGHT ATRIUM           Index LA diam:        3.80 cm 2.37 cm/m   RA Area:     18.80 cm LA Vol (A2C):   77.6 ml 48.40 ml/m  RA Volume:   50.70 ml  31.62 ml/m LA Vol (A4C):   66.2 ml 41.29 ml/m LA Biplane Vol: 72.2 ml 45.03 ml/m  AORTIC VALVE             PULMONIC VALVE LVOT Vmax:   123.00 cm/s PV Vmax:          1.02 m/s LVOT Vmean:  78.600 cm/s PV Peak grad:     4.2 mmHg LVOT VTI:    0.274 m     PR End Diast Vel: 5.48 msec AI PHT:      453 msec    RVOT Peak grad:   2 mmHg  AORTA Ao Root diam: 2.60 cm Ao Asc diam:  3.10 cm MITRAL VALVE                TRICUSPID VALVE MV Area (PHT): 3.65 cm     TR Peak grad:   34.6 mmHg MV Area VTI:   1.46 cm     TR Vmax:        294.00 cm/s MV Peak grad:  8.5 mmHg MV Mean grad:  4.0 mmHg     SHUNTS MV Vmax:       1.46 m/s     Systemic VTI:  0.27 m MV Vmean:      90.3 cm/s    Systemic Diam: 1.70 cm MV Decel Time: 208 msec MV E velocity: 144.00 cm/s MV A velocity: 127.00 cm/s MV E/A ratio:  1.13 MV  A Prime:    7.0 cm/s Clotilde Dieter  Electronically signed by Clotilde Dieter Signature Date/Time: 06/12/2023/12:34:53 PM    Final    DG Chest 2 View Result Date: 06/11/2023 CLINICAL DATA:  Leukocytosis and bacteremia. EXAM: CHEST - 2 VIEW COMPARISON:  None Available. FINDINGS: Stable mild cardiomegaly. Aortic atherosclerotic calcification incidentally noted. Both lungs are clear. The visualized skeletal structures are unremarkable. IMPRESSION: Mild cardiomegaly.  No active lung disease. Electronically Signed   By: Danae Orleans M.D.   On: 06/11/2023 15:56   DG Chest 2 View Result Date: 06/10/2023 CLINICAL DATA:  Loss of consciousness and bradycardia EXAM: CHEST - 2 VIEW COMPARISON:  Chest radiograph dated 01/10/2022 FINDINGS: Normal lung volumes. No focal consolidations. No pleural effusion or pneumothorax. The heart size and mediastinal contours are within normal limits. No acute osseous abnormality. IMPRESSION: No active cardiopulmonary disease. Electronically Signed   By: Agustin Cree M.D.   On: 06/10/2023 16:15   89 yo with Hx of chronic lymphocytosis currently admitted to hospital for syncope- work positive for bacteremia-  # Chronic lymphocytosis-DDx: likely CLL / low grade leukemia. PET scan- negative for any obvious lymphadenopathy/ and organomegaly.   # bacteremia- extensive work up including- PET scan; s/p ID evaluation negative for obvious source. Patient declined colonoscopy.  # Hx of A.fib- on Eliquis  Recommendation:  # I would peripheral blood flow-cytometry; LDH; quantitative immunoglobulins.  If patient has decreased IgG immunoglobulins consider IVIG infusions to boost immunity.  # Recommend follow up in clinic in 1-2 weeks to review the results of above tests.   Thank you Dr.Lai for allowing me to participate in the care of your pleasant patient. Please do not hesitate to contact me with questions or concerns in the interim. Above plan of care was discussed with patient  in detail.  My contact information was given  to the patient.    Earna Coder, MD 06/16/2023 7:34 PM

## 2023-06-16 NOTE — TOC CM/SW Note (Addendum)
Transition of Care [facility]) - Inpatient Brief Assessment   Patient Details  Name: Tiffany Martinez MRN: [id number] Date of Birth: October 17, 1933  Transition of Care [facility]) CM/SW Contact:    Margarito Liner, LCSW Phone Number: 06/16/2023, 4:07 PM   Clinical Narrative: Patient has orders to discharge home today. Chart reviewed. No TOC needs identified. CSW signing off.  4:50 pm: [facility] is unable to pick patient up. RN will have her sign the rider waiver. Address on facesheet is correct. Patient confirmed she has her key. Safe Transport arranged. CSW signing off.  Transition of Care Asessment: Insurance and Status: Insurance coverage has been reviewed Patient has primary care physician: Yes Home environment has been reviewed: [facility] Prior level of function:: Not documented Prior/Current Home Services: No current home services Social Drivers of Health Review: SDOH reviewed no interventions necessary Readmission risk has been reviewed: Yes Transition of care needs: no transition of care needs at this time

## 2023-06-16 NOTE — Progress Notes (Signed)
Date of Admission:  06/11/2023      ID: Tiffany Martinez is a 89 y.o. female Principal Problem:   Bacteremia due to Clostridium species Active Problems:   Hyperlipidemia   Chronic anemia   Essential hypertension, benign   Peripheral arterial disease (HCC)   Aortic atherosclerosis (HCC)   CLL (chronic lymphocytic leukemia) (HCC)   COPD (chronic obstructive pulmonary disease) (HCC)   Insomnia   Paroxysmal atrial fibrillation (HCC)   Status post hip hemiarthroplasty   Bacteremia   CKD stage 3b, GFR 30-44 ml/min (HCC)   Syncope   Endocarditis    Medications:   amLODipine  10 mg Oral Daily   heparin injection (subcutaneous)  5,000 Units Subcutaneous Q8H   losartan  100 mg Oral Daily   metoprolol succinate  12.5 mg Oral Daily   polyethylene glycol-electrolytes  4,000 mL Oral Once   Pt wants to go home upset that the process in the hospital of doing tests is taking days   Feeling good  Objective: Vital signs in last 24 hours: Patient Vitals for the past 24 hrs:  BP Temp Temp src Pulse Resp SpO2  06/16/23 0813 (!) 149/64 98 F (36.7 C) -- (!) 55 16 98 %  06/16/23 0337 116/60 98.1 F (36.7 C) Oral (!) 55 20 95 %  06/15/23 2046 (!) 157/68 98 F (36.7 C) Oral (!) 53 20 98 %  06/15/23 1638 (!) 159/72 (!) 97.5 F (36.4 C) Oral (!) 57 18 96 %    O/e awake and alert Chest b/l air entry S1s2 Abd soft CNS non focal  Lab Results    Latest Ref Rng & Units 06/16/2023    4:27 AM 06/15/2023    3:32 AM 06/14/2023    3:51 AM  CBC  WBC 4.0 - 10.5 K/uL 15.7  15.9  16.9   Hemoglobin 12.0 - 15.0 g/dL 9.1  9.1  9.5   Hematocrit 36.0 - 46.0 % 26.7  26.9  27.4   Platelets 150 - 400 K/uL 86  84  93        Latest Ref Rng & Units 06/16/2023    4:27 AM 06/15/2023    3:32 AM 06/14/2023    3:51 AM  CMP  Glucose 70 - 99 mg/dL 92  94  161   BUN 8 - 23 mg/dL 27  28  20    Creatinine 0.44 - 1.00 mg/dL 0.96  0.45  4.09   Sodium 135 - 145 mmol/L 140  139  138   Potassium 3.5 - 5.1 mmol/L  3.4  3.6  3.5   Chloride 98 - 111 mmol/L 111  106  108   CO2 22 - 32 mmol/L 22  22  23    Calcium 8.9 - 10.3 mg/dL 9.2  9.3  9.3       Microbiology: Clostridium ramosum in blood culture from 2 different days 06/10/23 06/11/23  Studies/Results: NM PET Image Initial (PI) Whole Body (F-18 FDG) Addendum Date: 06/16/2023 ADDENDUM REPORT: 06/16/2023 13:35 ADDENDUM: Voice recognition error: Correction - Incidental CT findings section: Aortic bi-iliac stent graft. NO abnormal metabolic activity. Findings conveyed to Dr Macario Golds 06/16/2023  at13:00. Electronically Signed   By: Genevive Bi M.D.   On: 06/16/2023 13:35   Result Date: 06/16/2023 CLINICAL DATA:  Initial treatment strategy for bacteremia on clear source. EXAM: NUCLEAR MEDICINE PET WHOLE BODY TECHNIQUE: 6.8 mCi F-18 FDG was injected intravenously. Full-ring PET imaging was performed from the head to foot after the radiotracer. CT data  was obtained and used for attenuation correction and anatomic localization. Fasting blood glucose: 90 mg/dl COMPARISON:  CT 40/98/1191 FINDINGS: HEAD/NECK: No hypermetabolic activity in the scalp. No hypermetabolic cervical lymph nodes. Incidental CT findings: none CHEST: No hypermetabolic mediastinal or hilar nodes. No suspicious pulmonary nodules on the CT scan. Incidental CT findings: Coronary artery calcification and aortic atherosclerotic calcification. No pneumonia. ABDOMEN/PELVIS: No abnormal hypermetabolic activity within the liver, pancreas, adrenal glands, or spleen. No hypermetabolic lymph nodes in the abdomen or pelvis. Incidental CT findings: Aortic bi-iliac stent graft. Abnormal metabolic activity. Large calcified leiomyoma posterior the uterus. SKELETON: No focal hypermetabolic activity to suggest skeletal metastasis. Incidental CT findings: none EXTREMITIES: No abnormal hypermetabolic activity in the lower extremities. Incidental CT findings: none IMPRESSION: 1. No source of infection identified  by FDG PET-CT imaging. 2. No source of infection identified on the CT portion exam. Electronically Signed: By: Genevive Bi M.D. On: 06/15/2023 20:15   ECHO TEE Result Date: 06/15/2023    TRANSESOPHOGEAL ECHO REPORT   Patient Name:   Tiffany Martinez Date of Exam: 06/15/2023 Medical Rec #:  [id number]       Height:       61.0 in Accession #:    [id number]      Weight:       136.0 lb Date of Birth:  November 07, 1933      BSA:          1.603 m Patient Age:    89 years        BP:           168/54 mmHg Patient Gender: F               HR:           65 bpm. Exam Location:  [facility] Procedure: Transesophageal Echo, Cardiac Doppler, Color Doppler and Saline            Contrast Bubble Study Indications:     Bacteremia R78.81  History:         Patient has prior history of Echocardiogram examinations, most                  recent 06/12/2023. CHF; Risk Factors:Dyslipidemia and                  Hypertension.  Sonographer:     Cristela Blue Referring Phys:  [id number] Antonieta Iba Diagnosing Phys: Julien Nordmann MD PROCEDURE: After discussion of the risks and benefits of a TEE, an informed consent was obtained from the patient. TEE procedure time was 30 minutes. The transesophogeal probe was passed without difficulty through the esophogus of the patient. Imaged were obtained with the patient in a left lateral decubitus position. Local oropharyngeal anesthetic was provided with Cetacaine and viscous lidocaine. Sedation performed by different physician. Image quality was excellent. The patient's vital signs; including heart rate, blood pressure, and oxygen saturation; remained stable throughout the procedure. The patient developed no complications during the procedure.  IMPRESSIONS  1. Left ventricular ejection fraction, by estimation, is 55 to 60%. The left ventricle has normal function. The left ventricle has no regional wall motion abnormalities.  2. Right ventricular systolic function is normal. The right ventricular size is normal.  3.  No left atrial/left atrial appendage thrombus was detected.  4. The mitral valve is normal in structure. Mild to moderate mitral valve regurgitation. No evidence of mitral stenosis.  5. The aortic valve is normal in structure. Aortic valve regurgitation is mild. No  aortic stenosis is present.  6. The inferior vena cava is normal in size with greater than 50% respiratory variability, suggesting right atrial pressure of 3 mmHg.  7. Agitated saline contrast bubble study was negative, with no evidence of any interatrial shunt.  8. No valve vegetation noted Conclusion(s)/Recommendation(s): Normal biventricular function without evidence of hemodynamically significant valvular heart disease. FINDINGS  Left Ventricle: Left ventricular ejection fraction, by estimation, is 55 to 60%. The left ventricle has normal function. The left ventricle has no regional wall motion abnormalities. The left ventricular internal cavity size was normal in size. There is  no left ventricular hypertrophy. Right Ventricle: The right ventricular size is normal. No increase in right ventricular wall thickness. Right ventricular systolic function is normal. Left Atrium: Left atrial size was normal in size. No left atrial/left atrial appendage thrombus was detected. Right Atrium: Right atrial size was normal in size. Pericardium: There is no evidence of pericardial effusion. Mitral Valve: The mitral valve is normal in structure. Mild to moderate mitral valve regurgitation. No evidence of mitral valve stenosis. There is no evidence of mitral valve vegetation. Tricuspid Valve: The tricuspid valve is normal in structure. Tricuspid valve regurgitation is not demonstrated. No evidence of tricuspid stenosis. There is no evidence of tricuspid valve vegetation. Aortic Valve: The aortic valve is normal in structure. Aortic valve regurgitation is mild. No aortic stenosis is present. There is no evidence of aortic valve vegetation. Pulmonic Valve: The pulmonic  valve was normal in structure. Pulmonic valve regurgitation is not visualized. No evidence of pulmonic stenosis. There is no evidence of pulmonic valve vegetation. Aorta: The aortic root is normal in size and structure. There is minimal (Grade I) atheroma plaque involving the aortic arch and descending aorta. Venous: The inferior vena cava is normal in size with greater than 50% respiratory variability, suggesting right atrial pressure of 3 mmHg. IAS/Shunts: No atrial level shunt detected by color flow Doppler. Agitated saline contrast was given intravenously to evaluate for intracardiac shunting. Agitated saline contrast bubble study was negative, with no evidence of any interatrial shunt. There  is no evidence of a patent foramen ovale. There is no evidence of an atrial septal defect. Julien Nordmann MD Electronically signed by Julien Nordmann MD Signature Date/Time: 06/15/2023/9:27:03 PM    Final      Assessment/Plan:  Clostridium ramosum bacteremia- this is a GI organism- rare but  associated with intraabdominal infections-  She has infrarenal aortic aneurysm and has had endovascular aneurysm repair in 2013 -EVAR- aortic Bi-iliac  stent/graft  CTA no evidence of graft infection PET scan no evidence of increased uptake TEE no endocarditis 06/15/23 BC NG so far Will send her on PO augmentin for 10 more days  ( adjusted to crcl) until 06/26/23 And do a blood culture 2-3 weeks after completion of antibiotic to look for clearance of bacteria    She has chronic mesenteric ischemia - case report of this bacteria being associated with this condition and causing bacteremia is known   She has CLL and is immune compromised which could be put her at risk  recommend Heme/Onc consult  Could she have GI malignancy? Pt is not interested in colonoscopy    Will DC Anemia      Near syncope Would recommend getitng cardiology involved Htn she is on high dose losartan, metoprolol, amlodipine   Afib- on  eliquis   Falls- causing fractures in the past 4 years Rt hip- has THA Wrist fractures Pubic bone fracture   Current  smoker 1 glass redwine every day  Discussed the management with patient in detail Discussed with her PCP at [facility] Dr.Beamer.- she will check a blood culture 2-3 weeks after completion of antibiotic

## 2023-06-16 NOTE — Discharge Summary (Incomplete)
Physician Discharge Summary   Tiffany Martinez  female DOB: 1933-11-04  QIO:[id number]  PCP: Earnestine Mealing, MD  Admit date: 06/11/2023 Discharge date: ***  Admitted From: *** Disposition:  {disposition:18248} Home Health: {yes/no:20286} CODE STATUS: {Palliative Code status:23503}   Hospital Course:  For full details, please see H&P, progress notes, consult notes and ancillary notes.  Briefly,  ***  Augmentin   Unless noted above, medications under "STOP" list are ones pt was not taking PTA.  Discharge Diagnoses:  Principal Problem:   Bacteremia due to Clostridium species Active Problems:   Hyperlipidemia   Chronic anemia   Essential hypertension, benign   Peripheral arterial disease (HCC)   Aortic atherosclerosis (HCC)   CLL (chronic lymphocytic leukemia) (HCC)   COPD (chronic obstructive pulmonary disease) (HCC)   Insomnia   Paroxysmal atrial fibrillation (HCC)   Status post hip hemiarthroplasty   Bacteremia   CKD stage 3b, GFR 30-44 ml/min (HCC)   Syncope   Endocarditis   30 Day Unplanned Readmission Risk Score    Flowsheet Row ED to Hosp-Admission (Current) from 06/11/2023 in [facility]  30 Day Unplanned Readmission Risk Score (%) 19.61 Filed at 06/16/2023 1200       This score is the patient's risk of an unplanned readmission within 30 days of being discharged (0 -100%). The score is based on dignosis, age, lab data, medications, orders, and past utilization.   Low:  0-14.9   Medium: 15-21.9   High: 22-29.9   Extreme: 30 and above         Discharge Instructions:  Allergies as of 06/16/2023       Reactions   Triamterene Hives        Medication List     TAKE these medications    amLODipine 5 MG tablet Commonly known as: NORVASC Take 1 tablet (5 mg total) by mouth daily.   amoxicillin-clavulanate 500-125 MG tablet Commonly known as: Augmentin Take 1 tablet by mouth 3 (three) times daily for 21  doses.   Eliquis 2.5 MG Tabs tablet Generic drug: apixaban TAKE 1 TABLET BY MOUTH TWICE DAILY   loratadine 10 MG tablet Commonly known as: CLARITIN Take 10 mg by mouth daily.   losartan 100 MG tablet Commonly known as: COZAAR Take 1 tablet (100 mg total) by mouth daily.   metoprolol succinate 25 MG 24 hr tablet Commonly known as: TOPROL-XL Take 0.5 tablets (12.5 mg total) by mouth daily.   MULTIVITAMIN ADULT PO Take 1 tablet by mouth daily.         Follow-up Information     Earnestine Mealing, MD Follow up in 1 week(s).   Specialty: Family Medicine Contact information: [address] [place] [address] [phone]                 Allergies  Allergen Reactions   Triamterene Hives     The results of significant diagnostics from this hospitalization (including imaging, microbiology, ancillary and laboratory) are listed below for reference.   Consultations:   Procedures/Studies: NM PET Image Initial (PI) Whole Body (F-18 FDG) Addendum Date: 06/16/2023 ADDENDUM REPORT: 06/16/2023 13:35 ADDENDUM: Voice recognition error: Correction - Incidental CT findings section: Aortic bi-iliac stent graft. NO abnormal metabolic activity. Findings conveyed to Dr Macario Golds 06/16/2023  at13:00. Electronically Signed   By: Genevive Bi M.D.   On: 06/16/2023 13:35   Result Date: 06/16/2023 CLINICAL DATA:  Initial treatment strategy for bacteremia on clear source. EXAM: NUCLEAR MEDICINE PET WHOLE  BODY TECHNIQUE: 6.8 mCi F-18 FDG was injected intravenously. Full-ring PET imaging was performed from the head to foot after the radiotracer. CT data was obtained and used for attenuation correction and anatomic localization. Fasting blood glucose: 90 mg/dl COMPARISON:  CT 13/12/6576 FINDINGS: HEAD/NECK: No hypermetabolic activity in the scalp. No hypermetabolic cervical lymph nodes. Incidental CT findings: none CHEST: No hypermetabolic mediastinal or hilar nodes. No suspicious  pulmonary nodules on the CT scan. Incidental CT findings: Coronary artery calcification and aortic atherosclerotic calcification. No pneumonia. ABDOMEN/PELVIS: No abnormal hypermetabolic activity within the liver, pancreas, adrenal glands, or spleen. No hypermetabolic lymph nodes in the abdomen or pelvis. Incidental CT findings: Aortic bi-iliac stent graft. Abnormal metabolic activity. Large calcified leiomyoma posterior the uterus. SKELETON: No focal hypermetabolic activity to suggest skeletal metastasis. Incidental CT findings: none EXTREMITIES: No abnormal hypermetabolic activity in the lower extremities. Incidental CT findings: none IMPRESSION: 1. No source of infection identified by FDG PET-CT imaging. 2. No source of infection identified on the CT portion exam. Electronically Signed: By: Genevive Bi M.D. On: 06/15/2023 20:15   ECHO TEE Result Date: 06/15/2023    TRANSESOPHOGEAL ECHO REPORT   Patient Name:   Tiffany Martinez Date of Exam: 06/15/2023 Medical Rec #:  [id number]       Height:       61.0 in Accession #:    [id number]      Weight:       136.0 lb Date of Birth:  11/26/1933      BSA:          1.603 m Patient Age:    89 years        BP:           168/54 mmHg Patient Gender: F               HR:           65 bpm. Exam Location:  [facility] Procedure: Transesophageal Echo, Cardiac Doppler, Color Doppler and Saline            Contrast Bubble Study Indications:     Bacteremia R78.81  History:         Patient has prior history of Echocardiogram examinations, most                  recent 06/12/2023. CHF; Risk Factors:Dyslipidemia and                  Hypertension.  Sonographer:     Cristela Blue Referring Phys:  [id number] Antonieta Iba Diagnosing Phys: Julien Nordmann MD PROCEDURE: After discussion of the risks and benefits of a TEE, an informed consent was obtained from the patient. TEE procedure time was 30 minutes. The transesophogeal probe was passed without difficulty through the esophogus of the patient.  Imaged were obtained with the patient in a left lateral decubitus position. Local oropharyngeal anesthetic was provided with Cetacaine and viscous lidocaine. Sedation performed by different physician. Image quality was excellent. The patient's vital signs; including heart rate, blood pressure, and oxygen saturation; remained stable throughout the procedure. The patient developed no complications during the procedure.  IMPRESSIONS  1. Left ventricular ejection fraction, by estimation, is 55 to 60%. The left ventricle has normal function. The left ventricle has no regional wall motion abnormalities.  2. Right ventricular systolic function is normal. The right ventricular size is normal.  3. No left atrial/left atrial appendage thrombus was detected.  4. The mitral valve is normal in structure. Mild to  moderate mitral valve regurgitation. No evidence of mitral stenosis.  5. The aortic valve is normal in structure. Aortic valve regurgitation is mild. No aortic stenosis is present.  6. The inferior vena cava is normal in size with greater than 50% respiratory variability, suggesting right atrial pressure of 3 mmHg.  7. Agitated saline contrast bubble study was negative, with no evidence of any interatrial shunt.  8. No valve vegetation noted Conclusion(s)/Recommendation(s): Normal biventricular function without evidence of hemodynamically significant valvular heart disease. FINDINGS  Left Ventricle: Left ventricular ejection fraction, by estimation, is 55 to 60%. The left ventricle has normal function. The left ventricle has no regional wall motion abnormalities. The left ventricular internal cavity size was normal in size. There is  no left ventricular hypertrophy. Right Ventricle: The right ventricular size is normal. No increase in right ventricular wall thickness. Right ventricular systolic function is normal. Left Atrium: Left atrial size was normal in size. No left atrial/left atrial appendage thrombus was  detected. Right Atrium: Right atrial size was normal in size. Pericardium: There is no evidence of pericardial effusion. Mitral Valve: The mitral valve is normal in structure. Mild to moderate mitral valve regurgitation. No evidence of mitral valve stenosis. There is no evidence of mitral valve vegetation. Tricuspid Valve: The tricuspid valve is normal in structure. Tricuspid valve regurgitation is not demonstrated. No evidence of tricuspid stenosis. There is no evidence of tricuspid valve vegetation. Aortic Valve: The aortic valve is normal in structure. Aortic valve regurgitation is mild. No aortic stenosis is present. There is no evidence of aortic valve vegetation. Pulmonic Valve: The pulmonic valve was normal in structure. Pulmonic valve regurgitation is not visualized. No evidence of pulmonic stenosis. There is no evidence of pulmonic valve vegetation. Aorta: The aortic root is normal in size and structure. There is minimal (Grade I) atheroma plaque involving the aortic arch and descending aorta. Venous: The inferior vena cava is normal in size with greater than 50% respiratory variability, suggesting right atrial pressure of 3 mmHg. IAS/Shunts: No atrial level shunt detected by color flow Doppler. Agitated saline contrast was given intravenously to evaluate for intracardiac shunting. Agitated saline contrast bubble study was negative, with no evidence of any interatrial shunt. There  is no evidence of a patent foramen ovale. There is no evidence of an atrial septal defect. Julien Nordmann MD Electronically signed by Julien Nordmann MD Signature Date/Time: 06/15/2023/9:27:03 PM    Final    CT Angio Chest/Abd/Pel for Dissection W and/or W/WO Result Date: 06/13/2023 CLINICAL DATA:  Aortic aneurysm suspected.  Aortic graft infection. EXAM: CT ANGIOGRAPHY CHEST, ABDOMEN AND PELVIS TECHNIQUE: Non-contrast CT of the chest was initially obtained. Multidetector CT imaging through the chest, abdomen and pelvis was  performed using the standard protocol during bolus administration of intravenous contrast. Multiplanar reconstructed images and MIPs were obtained and reviewed to evaluate the vascular anatomy. RADIATION DOSE REDUCTION: This exam was performed according to the departmental dose-optimization program which includes automated exposure control, adjustment of the mA and/or kV according to patient size and/or use of iterative reconstruction technique. CONTRAST:  75mL OMNIPAQUE IOHEXOL 350 MG/ML SOLN COMPARISON:  CT of the abdomen pelvis dated 01/10/2022. FINDINGS: Evaluation is limited due to streak artifact caused by right hip arthroplasty. CTA CHEST FINDINGS Cardiovascular: There is no cardiomegaly or pericardial effusion. Three-vessel coronary vascular calcification and calcification of the mitral annulus. Advanced atherosclerotic calcification of the thoracic aorta. No aneurysmal dilatation or dissection. The origins of the great vessels of the aortic arch appear  patent. No pulmonary artery embolus identified. Mediastinum/Nodes: No hilar or mediastinal adenopathy. The esophagus is grossly unremarkable. No mediastinal fluid collection. Lungs/Pleura: Bibasilar linear atelectasis/scarring. No focal consolidation, pleural effusion, or pneumothorax. The central airways are patent. Musculoskeletal: No acute osseous pathology. Review of the MIP images confirms the above findings. CTA ABDOMEN AND PELVIS FINDINGS VASCULAR Aorta: Advanced aortoiliac atherosclerotic disease. Status post prior aorto bi iliac endovascular stent graft repair of infrarenal abdominal aortic aneurysm. The stent graft appears patent. No contrast noted outside of the confines of the stent graft. No periaortic fluid collection. No significant interval change in the size of the excluded aneurysm sac. 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 The celiac artery remains patent. SMA: Advanced  atherosclerotic calcification and high-grade stenosis of the origin of the SMA. The SMA appears patent. Renals: There is atherosclerotic calcification of the origins of the renal arteries. The left renal artery is widely patent. There is narrowing of the right renal artery with diminished flow likely secondary to high-grade stenosis at the origin. This is similar to prior CT. IMA: The IMA is not visualized and appears occluded. Inflow: Advanced atherosclerotic calcification of the iliac arteries. No aneurysmal dilatation or dissection. The external iliac arteries appear patent. There is diminished flow in the internal iliac arteries. Veins: No obvious venous abnormality within the limitations of this arterial phase study. Review of the MIP images confirms the above findings. NON-VASCULAR No intra-abdominal free air or free fluid. Hepatobiliary: The liver is unremarkable. No biliary dilatation. Layering small stones in the gallbladder. No pericholecystic fluid or evidence of acute cholecystitis by CT. Pancreas: Unremarkable. No pancreatic ductal dilatation or surrounding inflammatory changes. Spleen: Normal in size without focal abnormality. Adrenals/Urinary Tract: The adrenal glands unremarkable. Moderate to severe right renal parenchyma atrophy. Multiple left renal cysts and smaller hypodense lesions which are too small to characterize. There is no hydronephrosis. The visualized ureters and urinary bladder appear unremarkable. Stomach/Bowel: There is sigmoid diverticulosis. No bowel obstruction or active inflammation. The appendix is normal. Lymphatic: No adenopathy. Reproductive: Calcified uterine fibroid. A 4.3 x 3.0 cm ovoid lesion in the right hemipelvis is not characterized but likely an exophytic and partially calcified fibroid. Ultrasound may provide better evaluation of the pelvic structures. This is however similar to prior CT. Other: None Musculoskeletal: Total right hip arthroplasty. Osteopenia with  degenerative changes of the spine. No acute osseous pathology. Review of the MIP images confirms the above findings. IMPRESSION: 1. No acute intrathoracic, abdominal, or pelvic pathology. 2. Status post prior aorto bi iliac endovascular stent graft repair of infrarenal abdominal aortic aneurysm. The stent graft appears patent. No periaortic fluid collection. No significant interval change in the size of the excluded aneurysm sac. 3. Sigmoid diverticulosis. No bowel obstruction. Normal appendix. 4. Cholelithiasis. 5. Moderate to severe right renal parenchyma atrophy. 6.  Aortic Atherosclerosis (ICD10-I70.0). Electronically Signed   By: Elgie Collard M.D.   On: 06/13/2023 16:36   ECHOCARDIOGRAM COMPLETE Result Date: 06/12/2023    ECHOCARDIOGRAM REPORT   Patient Name:   Tiffany Martinez Date of Exam: 06/12/2023 Medical Rec #:  [id number]       Height:       61.0 in Accession #:    [id number]      Weight:       136.0 lb Date of Birth:  1934-01-10      BSA:          1.603 m Patient Age:    61  years        BP:           170/57 mmHg Patient Gender: F               HR:           63 bpm. Exam Location:  [facility] Procedure: 2D Echo, Color Doppler and Cardiac Doppler Indications:     Bacteremia R78.81  History:         Patient has prior history of Echocardiogram examinations. CHF,                  Arrythmias:Atrial Fibrillation; Risk Factors:Hypertension.  Sonographer:     Neysa Bonito Roar Referring Phys:  [id number] AMY N COX Diagnosing Phys: Rozell Searing Custovic IMPRESSIONS  1. Left ventricular ejection fraction, by estimation, is 50 to 55%. Left ventricular ejection fraction by 2D MOD biplane is 53.8 %. The left ventricle has low normal function. The left ventricle has no regional wall motion abnormalities. There is mild left ventricular hypertrophy. Left ventricular diastolic parameters are consistent with Grade II diastolic dysfunction (pseudonormalization).  2. Right ventricular systolic function is normal. The right ventricular  size is normal.  3. Left atrial size was moderately dilated.  4. The mitral valve is normal in structure. Mild mitral valve regurgitation. No evidence of mitral stenosis.  5. The aortic valve is normal in structure. Aortic valve regurgitation is moderate. No aortic stenosis is present. Aortic regurgitation PHT measures 453 msec.  6. The inferior vena cava is normal in size with greater than 50% respiratory variability, suggesting right atrial pressure of 3 mmHg. FINDINGS  Left Ventricle: Left ventricular ejection fraction, by estimation, is 50 to 55%. Left ventricular ejection fraction by 2D MOD biplane is 53.8 %. The left ventricle has low normal function. The left ventricle has no regional wall motion abnormalities. The left ventricular internal cavity size was normal in size. There is mild left ventricular hypertrophy. Left ventricular diastolic parameters are consistent with Grade II diastolic dysfunction (pseudonormalization). Right Ventricle: The right ventricular size is normal. No increase in right ventricular wall thickness. Right ventricular systolic function is normal. Left Atrium: Left atrial size was moderately dilated. Right Atrium: Right atrial size was normal in size. Pericardium: There is no evidence of pericardial effusion. Mitral Valve: The mitral valve is normal in structure. Mild mitral valve regurgitation. No evidence of mitral valve stenosis. MV peak gradient, 8.5 mmHg. The mean mitral valve gradient is 4.0 mmHg. Tricuspid Valve: The tricuspid valve is normal in structure. Tricuspid valve regurgitation is mild. Aortic Valve: The aortic valve is normal in structure. Aortic valve regurgitation is moderate. Aortic regurgitation PHT measures 453 msec. No aortic stenosis is present. Pulmonic Valve: The pulmonic valve was normal in structure. Pulmonic valve regurgitation is not visualized. Aorta: The aortic root is normal in size and structure. Venous: The inferior vena cava is normal in size with  greater than 50% respiratory variability, suggesting right atrial pressure of 3 mmHg. IAS/Shunts: No atrial level shunt detected by color flow Doppler.  LEFT VENTRICLE PLAX 2D                        Biplane EF (MOD) LVIDd:         4.90 cm         LV Biplane EF:   Left LVIDs:         3.60 cm  ventricular LV PW:         1.00 cm                          ejection LV IVS:        1.20 cm                          fraction by LVOT diam:     1.70 cm                          2D MOD LV SV:         62                               biplane is LV SV Index:   39                               53.8 %. LVOT Area:     2.27 cm                                Diastology                                LV e' medial:    5.18 cm/s LV Volumes (MOD)               LV E/e' medial:  27.8 LV vol d, MOD    84.6 ml       LV e' lateral:   6.90 cm/s A2C:                           LV E/e' lateral: 20.9 LV vol d, MOD    83.8 ml A4C: LV vol s, MOD    36.7 ml A2C: LV vol s, MOD    39.0 ml A4C: LV SV MOD A2C:   47.9 ml LV SV MOD A4C:   83.8 ml LV SV MOD BP:    46.1 ml RIGHT VENTRICLE RV Basal diam:  3.60 cm RV Mid diam:    2.70 cm RV S prime:     11.60 cm/s TAPSE (M-mode): 2.6 cm LEFT ATRIUM             Index        RIGHT ATRIUM           Index LA diam:        3.80 cm 2.37 cm/m   RA Area:     18.80 cm LA Vol (A2C):   77.6 ml 48.40 ml/m  RA Volume:   50.70 ml  31.62 ml/m LA Vol (A4C):   66.2 ml 41.29 ml/m LA Biplane Vol: 72.2 ml 45.03 ml/m  AORTIC VALVE             PULMONIC VALVE LVOT Vmax:   123.00 cm/s PV Vmax:          1.02 m/s LVOT Vmean:  78.600 cm/s PV Peak grad:     4.2 mmHg LVOT VTI:    0.274 m     PR End Diast Vel: 5.48 msec AI PHT:      453 msec  RVOT Peak grad:   2 mmHg  AORTA Ao Root diam: 2.60 cm Ao Asc diam:  3.10 cm MITRAL VALVE                TRICUSPID VALVE MV Area (PHT): 3.65 cm     TR Peak grad:   34.6 mmHg MV Area VTI:   1.46 cm     TR Vmax:        294.00 cm/s MV Peak grad:  8.5 mmHg MV Mean grad:  4.0  mmHg     SHUNTS MV Vmax:       1.46 m/s     Systemic VTI:  0.27 m MV Vmean:      90.3 cm/s    Systemic Diam: 1.70 cm MV Decel Time: 208 msec MV E velocity: 144.00 cm/s MV A velocity: 127.00 cm/s MV E/A ratio:  1.13 MV A Prime:    7.0 cm/s Designer, multimedia signed by Clotilde Dieter Signature Date/Time: 06/12/2023/12:34:53 PM    Final    DG Chest 2 View Result Date: 06/11/2023 CLINICAL DATA:  Leukocytosis and bacteremia. EXAM: CHEST - 2 VIEW COMPARISON:  None Available. FINDINGS: Stable mild cardiomegaly. Aortic atherosclerotic calcification incidentally noted. Both lungs are clear. The visualized skeletal structures are unremarkable. IMPRESSION: Mild cardiomegaly.  No active lung disease. Electronically Signed   By: Danae Orleans M.D.   On: 06/11/2023 15:56   DG Chest 2 View Result Date: 06/10/2023 CLINICAL DATA:  Loss of consciousness and bradycardia EXAM: CHEST - 2 VIEW COMPARISON:  Chest radiograph dated 01/10/2022 FINDINGS: Normal lung volumes. No focal consolidations. No pleural effusion or pneumothorax. The heart size and mediastinal contours are within normal limits. No acute osseous abnormality. IMPRESSION: No active cardiopulmonary disease. Electronically Signed   By: Agustin Cree M.D.   On: 06/10/2023 16:15      Labs: BNP (last 3 results) Recent Labs    06/10/23 1511  BNP 260.6*   Basic Metabolic Panel: Recent Labs  Lab 06/12/23 0507 06/13/23 0500 06/14/23 0351 06/15/23 0332 06/16/23 0427  NA 138 140 138 139 140  K 4.0 3.4* 3.5 3.6 3.4*  CL 113* 112* 108 106 111  CO2 19* 19* 23 22 22   GLUCOSE 96 99 104* 94 92  BUN 27* 24* 20 28* 27*  CREATININE 1.38* 1.39* 1.34* 1.65* 1.42*  CALCIUM 8.9 9.5 9.3 9.3 9.2  MG  --  1.9 2.1 2.1 2.1  PHOS  --  2.7  --   --   --    Liver Function Tests: Recent Labs  Lab 06/10/23 1427 06/11/23 1500  AST 29 27  ALT 15 15  ALKPHOS 61 68  BILITOT 1.0 1.0  PROT 6.4* 6.7  ALBUMIN 3.6 4.0   No results for input(s): "LIPASE",  "AMYLASE" in the last 168 hours. No results for input(s): "AMMONIA" in the last 168 hours. CBC: Recent Labs  Lab 06/11/23 1500 06/12/23 0507 06/13/23 0500 06/14/23 0351 06/15/23 0332 06/16/23 0427  WBC 27.0* 19.8* 21.3* 16.9* 15.9* 15.7*  NEUTROABS 4.2  --   --   --   --   --   HGB 11.4* 9.7* 10.1* 9.5* 9.1* 9.1*  HCT 34.1* 29.4* 30.0* 27.4* 26.9* 26.7*  MCV 105.2* 105.8* 107.1* 102.2* 103.1* 103.9*  PLT 110* 98* 93* 93* 84* 86*   Cardiac Enzymes: No results for input(s): "CKTOTAL", "CKMB", "CKMBINDEX", "TROPONINI" in the last 168 hours. BNP: Invalid input(s): "POCBNP" CBG: Recent Labs  Lab 06/13/23 0200 06/15/23 1440  GLUCAP 113*  90   D-Dimer No results for input(s): "DDIMER" in the last 72 hours. Hgb A1c No results for input(s): "HGBA1C" in the last 72 hours. Lipid Profile No results for input(s): "CHOL", "HDL", "LDLCALC", "TRIG", "CHOLHDL", "LDLDIRECT" in the last 72 hours. Thyroid function studies No results for input(s): "TSH", "T4TOTAL", "T3FREE", "THYROIDAB" in the last 72 hours.  Invalid input(s): "FREET3" Anemia work up Recent Labs    06/15/23 0332  FERRITIN 99  TIBC 248*  IRON 40  RETICCTPCT 1.7   Urinalysis    Component Value Date/Time   COLORURINE YELLOW (A) 06/10/2023 1802   APPEARANCEUR HAZY (A) 06/10/2023 1802   APPEARANCEUR Clear 08/21/2014 1141   LABSPEC 1.013 06/10/2023 1802   LABSPEC 1.017 08/21/2014 1141   PHURINE 6.0 06/10/2023 1802   GLUCOSEU NEGATIVE 06/10/2023 1802   GLUCOSEU Negative 08/21/2014 1141   HGBUR NEGATIVE 06/10/2023 1802   BILIRUBINUR NEGATIVE 06/10/2023 1802   BILIRUBINUR Negative 08/21/2014 1141   KETONESUR NEGATIVE 06/10/2023 1802   PROTEINUR >=300 (A) 06/10/2023 1802   UROBILINOGEN negative 01/26/2012 1215   NITRITE NEGATIVE 06/10/2023 1802   LEUKOCYTESUR NEGATIVE 06/10/2023 1802   LEUKOCYTESUR Negative 08/21/2014 1141   Sepsis Labs Recent Labs  Lab 06/13/23 0500 06/14/23 0351 06/15/23 0332  06/16/23 0427  WBC 21.3* 16.9* 15.9* 15.7*   Microbiology Recent Results (from the past 240 hours)  Resp panel by RT-PCR (RSV, Flu A&B, Covid) Anterior Nasal Swab     Status: None   Collection Time: 06/10/23  3:11 PM   Specimen: Anterior Nasal Swab  Result Value Ref Range Status   SARS Coronavirus 2 by RT PCR NEGATIVE NEGATIVE Final    Comment: (NOTE) SARS-CoV-2 target nucleic acids are NOT DETECTED.  The SARS-CoV-2 RNA is generally detectable in upper respiratory specimens during the acute phase of infection. The lowest concentration of SARS-CoV-2 viral copies this assay can detect is 138 copies/mL. A negative result does not preclude SARS-Cov-2 infection and should not be used as the sole basis for treatment or other patient management decisions. A negative result may occur with  improper specimen collection/handling, submission of specimen other than nasopharyngeal swab, presence of viral mutation(s) within the areas targeted by this assay, and inadequate number of viral copies(<138 copies/mL). A negative result must be combined with clinical observations, patient history, and epidemiological information. The expected result is Negative.  Fact Sheet for Patients:  BloggerCourse.com  Fact Sheet for Healthcare Providers:  SeriousBroker.it  This test is no t yet approved or cleared by the [place] FDA and  has been authorized for detection and/or diagnosis of SARS-CoV-2 by FDA under an Emergency Use Authorization (EUA). This EUA will remain  in effect (meaning this test can be used) for the duration of the COVID-19 declaration under Section 564(b)(1) of the Act, 21 U.S.C.section 360bbb-3(b)(1), unless the authorization is terminated  or revoked sooner.       Influenza A by PCR NEGATIVE NEGATIVE Final   Influenza B by PCR NEGATIVE NEGATIVE Final    Comment: (NOTE) The Xpert Xpress SARS-CoV-2/FLU/RSV plus assay is  intended as an aid in the diagnosis of influenza from Nasopharyngeal swab specimens and should not be used as a sole basis for treatment. Nasal washings and aspirates are unacceptable for Xpert Xpress SARS-CoV-2/FLU/RSV testing.  Fact Sheet for Patients: BloggerCourse.com  Fact Sheet for Healthcare Providers: SeriousBroker.it  This test is not yet approved or cleared by the [place] FDA and has been authorized for detection and/or diagnosis of SARS-CoV-2 by FDA under an  Emergency Use Authorization (EUA). This EUA will remain in effect (meaning this test can be used) for the duration of the COVID-19 declaration under Section 564(b)(1) of the Act, 21 U.S.C. section 360bbb-3(b)(1), unless the authorization is terminated or revoked.     Resp Syncytial Virus by PCR NEGATIVE NEGATIVE Final    Comment: (NOTE) Fact Sheet for Patients: BloggerCourse.com  Fact Sheet for Healthcare Providers: SeriousBroker.it  This test is not yet approved or cleared by the [place] FDA and has been authorized for detection and/or diagnosis of SARS-CoV-2 by FDA under an Emergency Use Authorization (EUA). This EUA will remain in effect (meaning this test can be used) for the duration of the COVID-19 declaration under Section 564(b)(1) of the Act, 21 U.S.C. section 360bbb-3(b)(1), unless the authorization is terminated or revoked.  Performed at [facility], [address] [place], [place] [address]   Blood culture (routine x 2)     Status: None   Collection Time: 06/10/23  3:11 PM   Specimen: BLOOD RIGHT HAND  Result Value Ref Range Status   Specimen Description BLOOD RIGHT HAND  Final   Special Requests   Final    BOTTLES DRAWN AEROBIC ONLY Blood Culture results may not be optimal due to an inadequate volume of blood received in culture bottles   Culture   Final    NO GROWTH 5  DAYS Performed at [facility], [address] [place], [place] [address]    Report Status 06/15/2023 FINAL  Final  Blood culture (routine x 2)     Status: Abnormal (Preliminary result)   Collection Time: 06/10/23  4:39 PM   Specimen: Right Antecubital; Blood  Result Value Ref Range Status   Specimen Description   Final    RIGHT ANTECUBITAL Performed at [facility], [address], [place], [place] [address]    Special Requests   Final    BOTTLES DRAWN AEROBIC AND ANAEROBIC Blood Culture adequate volume Performed at [facility], [address] [place], [place] [address]    Culture  Setup Time   Final    ANAEROBIC BOTTLE ONLY GRAM POSITIVE RODS CRITICAL RESULT CALLED TO, READ BACK BY AND VERIFIED WITH: Rosselyn MURRAY @0840  06/11/23 MJU    Culture (A)  Final    CLOSTRIDIUM RAMOSUM Standardized susceptibility testing for this organism is not available. Sent to [facility] for further susceptibility testing. Performed at [facility], [address], [place], [place] [address]    Report Status PENDING  Incomplete  Resp panel by RT-PCR (RSV, Flu A&B, Covid) Anterior Nasal Swab     Status: None   Collection Time: 06/11/23  3:00 PM   Specimen: Anterior Nasal Swab  Result Value Ref Range Status   SARS Coronavirus 2 by RT PCR NEGATIVE NEGATIVE Final    Comment: (NOTE) SARS-CoV-2 target nucleic acids are NOT DETECTED.  The SARS-CoV-2 RNA is generally detectable in upper respiratory specimens during the acute phase of infection. The lowest concentration of SARS-CoV-2 viral copies this assay can detect is 138 copies/mL. A negative result does not preclude SARS-Cov-2 infection and should not be used as the sole basis for treatment or other patient management decisions. A negative result may occur with  improper specimen collection/handling, submission of specimen other than nasopharyngeal swab, presence of viral mutation(s) within the areas targeted  by this assay, and inadequate number of viral copies(<138 copies/mL). A negative result must be combined with clinical observations, patient history, and epidemiological information. The expected  result is Negative.  Fact Sheet for Patients:  BloggerCourse.com  Fact Sheet for Healthcare Providers:  SeriousBroker.it  This test is no t yet approved or cleared by the [place] FDA and  has been authorized for detection and/or diagnosis of SARS-CoV-2 by FDA under an Emergency Use Authorization (EUA). This EUA will remain  in effect (meaning this test can be used) for the duration of the COVID-19 declaration under Section 564(b)(1) of the Act, 21 U.S.C.section 360bbb-3(b)(1), unless the authorization is terminated  or revoked sooner.       Influenza A by PCR NEGATIVE NEGATIVE Final   Influenza B by PCR NEGATIVE NEGATIVE Final    Comment: (NOTE) The Xpert Xpress SARS-CoV-2/FLU/RSV plus assay is intended as an aid in the diagnosis of influenza from Nasopharyngeal swab specimens and should not be used as a sole basis for treatment. Nasal washings and aspirates are unacceptable for Xpert Xpress SARS-CoV-2/FLU/RSV testing.  Fact Sheet for Patients: BloggerCourse.com  Fact Sheet for Healthcare Providers: SeriousBroker.it  This test is not yet approved or cleared by the [place] FDA and has been authorized for detection and/or diagnosis of SARS-CoV-2 by FDA under an Emergency Use Authorization (EUA). This EUA will remain in effect (meaning this test can be used) for the duration of the COVID-19 declaration under Section 564(b)(1) of the Act, 21 U.S.C. section 360bbb-3(b)(1), unless the authorization is terminated or revoked.     Resp Syncytial Virus by PCR NEGATIVE NEGATIVE Final    Comment: (NOTE) Fact Sheet for Patients: BloggerCourse.com  Fact  Sheet for Healthcare Providers: SeriousBroker.it  This test is not yet approved or cleared by the [place] FDA and has been authorized for detection and/or diagnosis of SARS-CoV-2 by FDA under an Emergency Use Authorization (EUA). This EUA will remain in effect (meaning this test can be used) for the duration of the COVID-19 declaration under Section 564(b)(1) of the Act, 21 U.S.C. section 360bbb-3(b)(1), unless the authorization is terminated or revoked.  Performed at [facility], [address] [place], [place] [address]   Culture, blood (routine x 2)     Status: Abnormal   Collection Time: 06/11/23  3:02 PM   Specimen: BLOOD RIGHT FOREARM  Result Value Ref Range Status   Specimen Description   Final    BLOOD RIGHT FOREARM Performed at [facility], [address] [place], [place] [address]    Special Requests   Final    BOTTLES DRAWN AEROBIC AND ANAEROBIC Blood Culture adequate volume Performed at [facility], [address] [place], [place] [address]    Culture  Setup Time   Final    GRAM POSITIVE RODS ANAEROBIC BOTTLE ONLY CRITICAL VALUE NOTED.  VALUE IS CONSISTENT WITH PREVIOUSLY REPORTED AND CALLED VALUE.    Culture (A)  Final    CLOSTRIDIUM RAMOSUM Standardized susceptibility testing for this organism is not available. Performed at [facility], [address], [place], [place] [address]    Report Status 06/14/2023 FINAL  Final  Culture, blood (routine x 2)     Status: Abnormal   Collection Time: 06/11/23  3:02 PM   Specimen: BLOOD RIGHT ARM  Result Value Ref Range Status   Specimen Description   Final    BLOOD RIGHT ARM Performed at [facility], [address] [place], [place] [address]    Special Requests   Final    BOTTLES DRAWN AEROBIC AND ANAEROBIC Blood Culture adequate volume Performed at [facility], [address] [place], [place] [address]    Culture  Setup  Time   Final    GRAM POSITIVE RODS ANAEROBIC BOTTLE ONLY CRITICAL RESULT CALLED TO, READ BACK BY AND VERIFIED WITH: NATHAN BELUE AT 2536 06/12/23.PMF    Culture (A)  Final    CLOSTRIDIUM RAMOSUM Standardized susceptibility testing for this organism is not available. Performed at [facility], [address], [place], [place] [address]    Report Status 06/14/2023 FINAL  Final  MRSA Next Gen by PCR, Nasal     Status: None   Collection Time: 06/12/23 10:20 PM   Specimen: Nasal Mucosa; Nasal Swab  Result Value Ref Range Status   MRSA by PCR Next Gen NOT DETECTED NOT DETECTED Final    Comment: (NOTE) The GeneXpert MRSA Assay (FDA approved for NASAL specimens only), is one component of a comprehensive MRSA colonization surveillance program. It is not intended to diagnose MRSA infection nor to guide or monitor treatment for MRSA infections. Test performance is not FDA approved in patients less than 43 years old. Performed at [facility], [address] [place], [place] [address]   Culture, blood (Routine X 2) w Reflex to ID Panel     Status: None (Preliminary result)   Collection Time: 06/15/23  7:54 PM   Specimen: BLOOD  Result Value Ref Range Status   Specimen Description BLOOD BLOOD RIGHT ARM  Final   Special Requests   Final    BOTTLES DRAWN AEROBIC AND ANAEROBIC Blood Culture results may not be optimal due to an inadequate volume of blood received in culture bottles   Culture   Final    NO GROWTH < 12 HOURS Performed at [facility], [address] [place], [place] [address]    Report Status PENDING  Incomplete  Culture, blood (Routine X 2) w Reflex to ID Panel     Status: None (Preliminary result)   Collection Time: 06/15/23  7:59 PM   Specimen: BLOOD  Result Value Ref Range Status   Specimen Description BLOOD BLOOD RIGHT HAND  Final   Special Requests   Final    BOTTLES DRAWN AEROBIC AND ANAEROBIC Blood Culture results may not be optimal due to  an inadequate volume of blood received in culture bottles   Culture   Final    NO GROWTH < 12 HOURS Performed at [facility], [address], [place], [place] [address]    Report Status PENDING  Incomplete     Total time spend on discharging this patient, including the last patient exam, discussing the hospital stay, instructions for ongoing care as it relates to all pertinent caregivers, as well as preparing the medical discharge records, prescriptions, and/or referrals as applicable, is *** minutes.    Darlin Priestly, MD  [facility] 06/16/2023, 3:58 PM

## 2023-06-16 NOTE — Progress Notes (Signed)
Pt refused doses of heparin despite education surrounding its use. Pt states she would like to speak with the provider to figure out why are there new medications being ordered and she hasn't spoken to anyone about it. Offered SCD's and pt agreed just awaiting their arrival to unit and call has been made for them. Pt also mentioned needing assistance at home to perform certain ADLs as its becoming a challenge and she's experiencing SOB on exertion. Pt also expressed her desire to go home today. Notified day-shift nurse.

## 2023-06-17 LAB — IGG, IGA, IGM
IgA: 86 mg/dL (ref 64–422)
IgG (Immunoglobin G), Serum: 582 mg/dL — ABNORMAL LOW (ref 586–1602)
IgM (Immunoglobulin M), Srm: 187 mg/dL (ref 26–217)

## 2023-06-20 LAB — COMP PANEL: LEUKEMIA/LYMPHOMA: Immunophenotypic Profile: 69

## 2023-06-20 LAB — CULTURE, BLOOD (ROUTINE X 2)
Culture: NO GROWTH
Culture: NO GROWTH

## 2023-06-21 ENCOUNTER — Ambulatory Visit (INDEPENDENT_AMBULATORY_CARE_PROVIDER_SITE_OTHER): Payer: Medicare Other | Admitting: Nurse Practitioner

## 2023-06-21 ENCOUNTER — Encounter: Payer: Self-pay | Admitting: Nurse Practitioner

## 2023-06-21 VITALS — BP 138/64 | HR 62 | Temp 98.1°F | Ht 64.0 in | Wt 137.0 lb

## 2023-06-21 DIAGNOSIS — I10 Essential (primary) hypertension: Secondary | ICD-10-CM | POA: Diagnosis not present

## 2023-06-21 DIAGNOSIS — B9689 Other specified bacterial agents as the cause of diseases classified elsewhere: Secondary | ICD-10-CM

## 2023-06-21 DIAGNOSIS — R7881 Bacteremia: Secondary | ICD-10-CM

## 2023-06-21 DIAGNOSIS — N184 Chronic kidney disease, stage 4 (severe): Secondary | ICD-10-CM

## 2023-06-21 DIAGNOSIS — C9110 Chronic lymphocytic leukemia of B-cell type not having achieved remission: Secondary | ICD-10-CM | POA: Diagnosis not present

## 2023-06-21 DIAGNOSIS — I480 Paroxysmal atrial fibrillation: Secondary | ICD-10-CM

## 2023-06-21 NOTE — Progress Notes (Signed)
 Careteam: Patient Care Team: Abdul Fine, MD as PCP - General (Family Medicine) Perla Evalene PARAS, MD as PCP - Cardiology (Cardiology) Wonda Sharper, MD (Cardiology) PLACE OF SERVICE:  [facility]   Advanced Directive information Does Patient Have a Medical Advance Directive?: No, Would patient like information on creating a medical advance directive?: No - Patient declined  Allergies  Allergen Reactions   Triamterene  Hives    Chief Complaint  Patient presents with   Hospitalization Follow-up    Hospital Follow up. Discharged 06/16/23     HPI: Patient is a 89 y.o. female seen in today at the [facility] Discussed the use of AI scribe software for clinical note transcription with the patient, who gave verbal consent to proceed.  History of Present Illness   Tiffany Martinez is an 89 year old female who presents for follow-up after hospitalization for colostrum bacteremia from 1/25-1/30 she was diagnosed with colostrum bacteremia. She was discharged on Augmentin , which she is to take until June 26, 2023. She takes the medication with food due to GI upset and experiences mild diarrhea, which she attributes to the medication. She feels weak and requires a lot of sleep, noting that her hospital stay was exhausting due to numerous tests and lack of sleep.  She has a history of chronic lymphocytic leukemia (CLL), which she has had for many years, initially described to her as a 'white count problem.' During her hospitalization, a PET scan was negative for lymphadenopathy or organomegaly. She has not recently seen an oncologist for this condition. She notes that the leukemia has been more active since her recent illness.  Her past medical history includes hypertension, atrial fibrillation (AFib), chronic kidney disease, mesenteric ischemia, peripheral artery disease, and anxiety. She is on Eliquis  for AFib. During her hospitalization, there was a slight elevation in  her kidney function tests, indicating reduced kidney function.   Since she has been home she reports no issues with obtaining her medications, which are delivered to her.  No current nausea and she has not lost weight despite a reduced appetite during her hospital stay. She experiences shortness of breath, which she believes was present before her hospitalization, and notes that chest pain and palpitations have improved. No fever, chills, abdominal pain, or significant changes in her blood pressure, which is now well-controlled.     Review of Systems:  Review of Systems  Constitutional:  Positive for malaise/fatigue. Negative for chills, fever and weight loss.  HENT:  Negative for tinnitus.   Respiratory:  Positive for shortness of breath. Negative for cough and sputum production.   Cardiovascular:  Negative for chest pain, palpitations and leg swelling.  Gastrointestinal:  Negative for abdominal pain, constipation, diarrhea and heartburn.  Genitourinary:  Negative for dysuria, frequency and urgency.  Musculoskeletal:  Negative for back pain, falls, joint pain and myalgias.  Skin: Negative.   Neurological:  Negative for dizziness and headaches.  Psychiatric/Behavioral:  Negative for depression and memory loss. The patient does not have insomnia.     Past Medical History:  Diagnosis Date   AAA (abdominal aortic aneurysm) (HCC) 2012   3.9cm 2012; 2.8cm 12/2021   Anemia    Anxiety    CHF (congestive heart failure) (HCC)    LVEF 60-65% in 2023   Chronic venous insufficiency    CLL (chronic lymphocytic leukemia) (HCC) 2014   Stage 0--by flow cytometry   Hx of colonoscopy    Hyperlipidemia    Hypertension  Melanoma (HCC)    Right Arm   Migraine headache    3-4X/yr   PAD (peripheral artery disease) (HCC) 2012   severe R common iliac stenosis, ABI 0.73, mid L common iliac stenosis ABI 1.1   PAD (peripheral artery disease) (HCC)    Paroxysmal atrial fibrillation (HCC) 12/2021    Wears dentures    full upper and lower   Past Surgical History:  Procedure Laterality Date   ABDOMINAL AORTIC ANEURYSM REPAIR  02/16/12   [facility]--Dr Missy   CARDIOVASCULAR STRESS TEST  2008   negative   CATARACT EXTRACTION W/PHACO Left 07/16/2019   Procedure: CATARACT EXTRACTION PHACO AND INTRAOCULAR LENS PLACEMENT (IOC) LEFT 8.26  00:46.6;  Surgeon: Myrna Adine Anes, MD;  [facility];  Service: Ophthalmology;  Laterality: Left;   DOBUTAMINE  STRESS ECHO  11/11   negative   HIP ARTHROPLASTY Right 10/10/2019   Procedure: ARTHROPLASTY BIPOLAR HIP (HEMIARTHROPLASTY);  Surgeon: Edie Norleen PARAS, MD;  Location: [facility];  Service: Orthopedics;  Laterality: Right;   KNEE ARTHROSCOPY  1998   right   MELANOMA EXCISION  11/09/10   wide excision right upper arm   SQUAMOUS CELL CARCINOMA EXCISION  02/2016   TEE WITHOUT CARDIOVERSION N/A 06/15/2023   Procedure: TRANSESOPHAGEAL ECHOCARDIOGRAM (TEE);  Surgeon: Perla Evalene PARAS, MD;  Location: [facility];  Service: Cardiovascular;  Laterality: N/A;   TONSILLECTOMY AND ADENOIDECTOMY  1960   Social History:   reports that she has been smoking cigarettes. She has never been exposed to tobacco smoke. She has never used smokeless tobacco. She reports current alcohol use of about 7.0 standard drinks of alcohol per week. She reports that she does not use drugs.  Family History  Problem Relation Age of Onset   Alzheimer's disease Mother    Pancreatic cancer Father    Parkinson's disease Brother    Coronary artery disease Neg Hx    Diabetes Neg Hx    Cancer Neg Hx        breast or colon    Medications: Patient's Medications  New Prescriptions   No medications on file  Previous Medications   AMLODIPINE  (NORVASC ) 5 MG TABLET    Take 1 tablet (5 mg total) by mouth daily.   AMOXICILLIN -CLAVULANATE (AUGMENTIN ) 500-125 MG TABLET    Take 1 tablet by mouth 2 (two) times daily for 21 doses.   APIXABAN  (ELIQUIS ) 2.5 MG TABS TABLET    TAKE 1 TABLET  BY MOUTH TWICE DAILY   LORATADINE  (CLARITIN ) 10 MG TABLET    Take 10 mg by mouth daily.   LOSARTAN  (COZAAR ) 100 MG TABLET    Take 1 tablet (100 mg total) by mouth daily.   METOPROLOL  SUCCINATE (TOPROL -XL) 25 MG 24 HR TABLET    Take 0.5 tablets (12.5 mg total) by mouth daily.   MULTIPLE VITAMIN (MULTIVITAMIN ADULT PO)    Take 1 tablet by mouth daily.  Modified Medications   No medications on file  Discontinued Medications   No medications on file    Physical Exam:  Vitals:   06/21/23 0930  BP: 138/64  Pulse: 62  Temp: 98.1 F (36.7 C)  SpO2: 98%  Weight: 137 lb (62.1 kg)  Height: 5' 4 (1.626 m)   Body mass index is 23.52 kg/m. Wt Readings from Last 3 Encounters:  06/21/23 137 lb (62.1 kg)  06/11/23 136 lb (61.7 kg)  06/10/23 138 lb 0.1 oz (62.6 kg)    Physical Exam Constitutional:      General: She  is not in acute distress.    Appearance: She is well-developed. She is not diaphoretic.  HENT:     Head: Normocephalic and atraumatic.     Mouth/Throat:     Pharynx: No oropharyngeal exudate.  Eyes:     Conjunctiva/sclera: Conjunctivae normal.     Pupils: Pupils are equal, round, and reactive to light.  Cardiovascular:     Rate and Rhythm: Normal rate and regular rhythm.     Heart sounds: Normal heart sounds.  Pulmonary:     Effort: Pulmonary effort is normal.     Breath sounds: Normal breath sounds.  Abdominal:     General: Bowel sounds are normal.     Palpations: Abdomen is soft.  Musculoskeletal:     Cervical back: Normal range of motion and neck supple.     Right lower leg: No edema.     Left lower leg: No edema.  Skin:    General: Skin is warm and dry.  Neurological:     Mental Status: She is alert. Mental status is at baseline.     Motor: No weakness.     Gait: Gait normal.  Psychiatric:        Mood and Affect: Mood normal.     Labs reviewed: Basic Metabolic Panel: Recent Labs    06/13/23 0500 06/14/23 0351 06/15/23 0332 06/16/23 0427  NA 140  138 139 140  K 3.4* 3.5 3.6 3.4*  CL 112* 108 106 111  CO2 19* 23 22 22   GLUCOSE 99 104* 94 92  BUN 24* 20 28* 27*  CREATININE 1.39* 1.34* 1.65* 1.42*  CALCIUM  9.5 9.3 9.3 9.2  MG 1.9 2.1 2.1 2.1  PHOS 2.7  --   --   --    Liver Function Tests: Recent Labs    08/02/22 0000 06/10/23 1427 06/11/23 1500  AST 20 29 27   ALT 10 15 15   ALKPHOS 68 61 68  BILITOT  --  1.0 1.0  PROT  --  6.4* 6.7  ALBUMIN 3.9 3.6 4.0   No results for input(s): LIPASE, AMYLASE in the last 8760 hours. No results for input(s): AMMONIA in the last 8760 hours. CBC: Recent Labs    08/02/22 0000 12/27/22 0814 06/10/23 1418 06/11/23 1500 06/12/23 0507 06/14/23 0351 06/15/23 0332 06/16/23 0427  WBC 32.0 24.5*   < > 27.0*   < > 16.9* 15.9* 15.7*  NEUTROABS 3,968.00 3,455  --  4.2  --   --   --   --   HGB 11.1* 11.2*   < > 11.4*   < > 9.5* 9.1* 9.1*  HCT 33* 34.2*   < > 34.1*   < > 27.4* 26.9* 26.7*  MCV  --  105.6*   < > 105.2*   < > 102.2* 103.1* 103.9*  PLT 129* 77*   < > 110*   < > 93* 84* 86*   < > = values in this interval not displayed.   Lipid Panel: No results for input(s): CHOL, HDL, LDLCALC, TRIG, CHOLHDL, LDLDIRECT in the last 8760 hours. TSH: No results for input(s): TSH in the last 8760 hours. A1C: Lab Results  Component Value Date   HGBA1C 5.4 08/02/2022     Assessment/Plan  Colostrum Bacteremia Recent hospitalization followed by ID. Currently on Augmentin  until 06/26/2023. occasional diarrhea likely secondary to antibiotic use. She has declined colonoscopy during hospitalization  Weakness related to recent hospitalization [place] nursing to follow as well.  -Continue Augmentin  as prescribed. -Check blood  cultures 2-3 weeks after completion of antibiotics (around 07/11/2023). -follow up bmp and cbc on 2/24 as well.   Chronic Lymphocytic Leukemia (CLL) History of CLL, possibly more active since recent illness. No recent follow-up with oncology. -Refer to  oncology for further evaluation and management.  Chronic Kidney Disease (CKD) Stable CKD with slight elevation in kidney function during recent hospitalization. -Check BMP on 07/11/2023 to monitor kidney function.  Atrial Fibrillation (AFib) and Hypertension Well-controlled blood pressure. No recent changes in symptoms. -Continue current management.    Next appt: 07/13/2023 with Dr Abdul, sooner if needed Fawnda Vitullo K. Caro BODILY  [facility] [phone]

## 2023-06-21 NOTE — Patient Instructions (Addendum)
To come to [facility] on 07/11/2023 at 7:30 am for follow up labs

## 2023-06-22 ENCOUNTER — Emergency Department: Payer: Medicare Other

## 2023-06-22 ENCOUNTER — Other Ambulatory Visit: Payer: Self-pay

## 2023-06-22 ENCOUNTER — Inpatient Hospital Stay
Admission: EM | Admit: 2023-06-22 | Discharge: 2023-06-26 | DRG: 391 | Disposition: A | Discharge status: Skilled Nursing Facility | Payer: Medicare Other | Attending: Internal Medicine | Admitting: Internal Medicine

## 2023-06-22 DIAGNOSIS — Z9842 Cataract extraction status, left eye: Secondary | ICD-10-CM

## 2023-06-22 DIAGNOSIS — R6881 Early satiety: Secondary | ICD-10-CM | POA: Diagnosis present

## 2023-06-22 DIAGNOSIS — N1832 Chronic kidney disease, stage 3b: Secondary | ICD-10-CM | POA: Diagnosis present

## 2023-06-22 DIAGNOSIS — D62 Acute posthemorrhagic anemia: Secondary | ICD-10-CM | POA: Diagnosis present

## 2023-06-22 DIAGNOSIS — K529 Noninfective gastroenteritis and colitis, unspecified: Principal | ICD-10-CM

## 2023-06-22 DIAGNOSIS — Z66 Do not resuscitate: Secondary | ICD-10-CM | POA: Diagnosis present

## 2023-06-22 DIAGNOSIS — B9689 Other specified bacterial agents as the cause of diseases classified elsewhere: Secondary | ICD-10-CM

## 2023-06-22 DIAGNOSIS — R7881 Bacteremia: Secondary | ICD-10-CM | POA: Diagnosis present

## 2023-06-22 DIAGNOSIS — K2961 Other gastritis with bleeding: Secondary | ICD-10-CM | POA: Diagnosis present

## 2023-06-22 DIAGNOSIS — D61818 Other pancytopenia: Secondary | ICD-10-CM | POA: Diagnosis present

## 2023-06-22 DIAGNOSIS — Z888 Allergy status to other drugs, medicaments and biological substances status: Secondary | ICD-10-CM

## 2023-06-22 DIAGNOSIS — D630 Anemia in neoplastic disease: Secondary | ICD-10-CM | POA: Diagnosis present

## 2023-06-22 DIAGNOSIS — K8020 Calculus of gallbladder without cholecystitis without obstruction: Secondary | ICD-10-CM | POA: Diagnosis not present

## 2023-06-22 DIAGNOSIS — R109 Unspecified abdominal pain: Secondary | ICD-10-CM | POA: Diagnosis not present

## 2023-06-22 DIAGNOSIS — K589 Irritable bowel syndrome without diarrhea: Secondary | ICD-10-CM | POA: Diagnosis not present

## 2023-06-22 DIAGNOSIS — I5032 Chronic diastolic (congestive) heart failure: Secondary | ICD-10-CM | POA: Diagnosis not present

## 2023-06-22 DIAGNOSIS — K2981 Duodenitis with bleeding: Secondary | ICD-10-CM | POA: Diagnosis present

## 2023-06-22 DIAGNOSIS — Z96641 Presence of right artificial hip joint: Secondary | ICD-10-CM | POA: Diagnosis present

## 2023-06-22 DIAGNOSIS — Z79899 Other long term (current) drug therapy: Secondary | ICD-10-CM

## 2023-06-22 DIAGNOSIS — N281 Cyst of kidney, acquired: Secondary | ICD-10-CM | POA: Diagnosis not present

## 2023-06-22 DIAGNOSIS — I130 Hypertensive heart and chronic kidney disease with heart failure and stage 1 through stage 4 chronic kidney disease, or unspecified chronic kidney disease: Secondary | ICD-10-CM | POA: Diagnosis present

## 2023-06-22 DIAGNOSIS — K2980 Duodenitis without bleeding: Secondary | ICD-10-CM | POA: Diagnosis not present

## 2023-06-22 DIAGNOSIS — K5790 Diverticulosis of intestine, part unspecified, without perforation or abscess without bleeding: Secondary | ICD-10-CM | POA: Diagnosis not present

## 2023-06-22 DIAGNOSIS — I739 Peripheral vascular disease, unspecified: Secondary | ICD-10-CM | POA: Diagnosis present

## 2023-06-22 DIAGNOSIS — Z961 Presence of intraocular lens: Secondary | ICD-10-CM | POA: Diagnosis present

## 2023-06-22 DIAGNOSIS — N261 Atrophy of kidney (terminal): Secondary | ICD-10-CM | POA: Diagnosis not present

## 2023-06-22 DIAGNOSIS — K2990 Gastroduodenitis, unspecified, without bleeding: Secondary | ICD-10-CM | POA: Diagnosis not present

## 2023-06-22 DIAGNOSIS — D122 Benign neoplasm of ascending colon: Secondary | ICD-10-CM | POA: Diagnosis present

## 2023-06-22 DIAGNOSIS — Z85828 Personal history of other malignant neoplasm of skin: Secondary | ICD-10-CM

## 2023-06-22 DIAGNOSIS — K2289 Other specified disease of esophagus: Secondary | ICD-10-CM | POA: Diagnosis present

## 2023-06-22 DIAGNOSIS — C9110 Chronic lymphocytic leukemia of B-cell type not having achieved remission: Secondary | ICD-10-CM

## 2023-06-22 DIAGNOSIS — I480 Paroxysmal atrial fibrillation: Secondary | ICD-10-CM | POA: Diagnosis not present

## 2023-06-22 DIAGNOSIS — K5730 Diverticulosis of large intestine without perforation or abscess without bleeding: Secondary | ICD-10-CM | POA: Diagnosis not present

## 2023-06-22 DIAGNOSIS — K5289 Other specified noninfective gastroenteritis and colitis: Secondary | ICD-10-CM | POA: Diagnosis not present

## 2023-06-22 DIAGNOSIS — F17210 Nicotine dependence, cigarettes, uncomplicated: Secondary | ICD-10-CM | POA: Diagnosis present

## 2023-06-22 DIAGNOSIS — K635 Polyp of colon: Secondary | ICD-10-CM | POA: Diagnosis not present

## 2023-06-22 DIAGNOSIS — D126 Benign neoplasm of colon, unspecified: Secondary | ICD-10-CM | POA: Diagnosis not present

## 2023-06-22 DIAGNOSIS — K559 Vascular disorder of intestine, unspecified: Secondary | ICD-10-CM | POA: Diagnosis present

## 2023-06-22 DIAGNOSIS — K2960 Other gastritis without bleeding: Secondary | ICD-10-CM | POA: Insufficient documentation

## 2023-06-22 DIAGNOSIS — I10 Essential (primary) hypertension: Secondary | ICD-10-CM

## 2023-06-22 DIAGNOSIS — K319 Disease of stomach and duodenum, unspecified: Secondary | ICD-10-CM | POA: Diagnosis not present

## 2023-06-22 DIAGNOSIS — D6959 Other secondary thrombocytopenia: Secondary | ICD-10-CM | POA: Diagnosis present

## 2023-06-22 DIAGNOSIS — K640 First degree hemorrhoids: Secondary | ICD-10-CM | POA: Diagnosis present

## 2023-06-22 DIAGNOSIS — D128 Benign neoplasm of rectum: Secondary | ICD-10-CM | POA: Diagnosis not present

## 2023-06-22 DIAGNOSIS — R112 Nausea with vomiting, unspecified: Secondary | ICD-10-CM | POA: Diagnosis not present

## 2023-06-22 DIAGNOSIS — I714 Abdominal aortic aneurysm, without rupture, unspecified: Secondary | ICD-10-CM | POA: Diagnosis present

## 2023-06-22 DIAGNOSIS — Z7901 Long term (current) use of anticoagulants: Secondary | ICD-10-CM

## 2023-06-22 DIAGNOSIS — E785 Hyperlipidemia, unspecified: Secondary | ICD-10-CM | POA: Diagnosis present

## 2023-06-22 DIAGNOSIS — K3189 Other diseases of stomach and duodenum: Secondary | ICD-10-CM | POA: Diagnosis not present

## 2023-06-22 DIAGNOSIS — R1084 Generalized abdominal pain: Secondary | ICD-10-CM | POA: Diagnosis not present

## 2023-06-22 DIAGNOSIS — K648 Other hemorrhoids: Secondary | ICD-10-CM | POA: Diagnosis not present

## 2023-06-22 DIAGNOSIS — Z85820 Personal history of malignant melanoma of skin: Secondary | ICD-10-CM

## 2023-06-22 LAB — COMPREHENSIVE METABOLIC PANEL
ALT: 17 U/L (ref 0–44)
AST: 24 U/L (ref 15–41)
Albumin: 3.6 g/dL (ref 3.5–5.0)
Alkaline Phosphatase: 56 U/L (ref 38–126)
Anion gap: 9 (ref 5–15)
BUN: 27 mg/dL — ABNORMAL HIGH (ref 8–23)
CO2: 22 mmol/L (ref 22–32)
Calcium: 9.5 mg/dL (ref 8.9–10.3)
Chloride: 110 mmol/L (ref 98–111)
Creatinine, Ser: 1.63 mg/dL — ABNORMAL HIGH (ref 0.44–1.00)
GFR, Estimated: 30 mL/min — ABNORMAL LOW (ref >60)
Glucose, Bld: 139 mg/dL — ABNORMAL HIGH (ref 70–99)
Potassium: 4.5 mmol/L (ref 3.5–5.1)
Sodium: 141 mmol/L (ref 135–145)
Total Bilirubin: 0.6 mg/dL (ref 0.0–1.2)
Total Protein: 6 g/dL — ABNORMAL LOW (ref 6.5–8.1)

## 2023-06-22 LAB — CBC WITH DIFFERENTIAL/PLATELET
Abs Immature Granulocytes: 0.13 10*3/uL — ABNORMAL HIGH (ref 0.00–0.07)
Basophils Absolute: 0.1 10*3/uL (ref 0.0–0.1)
Basophils Relative: 0 %
Eosinophils Absolute: 0.1 10*3/uL (ref 0.0–0.5)
Eosinophils Relative: 0 %
HCT: 28.8 % — ABNORMAL LOW (ref 36.0–46.0)
Hemoglobin: 10.3 g/dL — ABNORMAL LOW (ref 12.0–15.0)
Immature Granulocytes: 0 %
Lymphocytes Relative: 77 %
Lymphs Abs: 22.4 10*3/uL — ABNORMAL HIGH (ref 0.7–4.0)
MCH: 40.1 pg — ABNORMAL HIGH (ref 26.0–34.0)
MCHC: 35.8 g/dL (ref 30.0–36.0)
MCV: 112.1 fL — ABNORMAL HIGH (ref 80.0–100.0)
Monocytes Absolute: 0.5 10*3/uL (ref 0.1–1.0)
Monocytes Relative: 2 %
Neutro Abs: 6 10*3/uL (ref 1.7–7.7)
Neutrophils Relative %: 21 %
Platelets: 146 10*3/uL — ABNORMAL LOW (ref 150–400)
RBC: 2.57 MIL/uL — ABNORMAL LOW (ref 3.87–5.11)
RDW: 14.5 % (ref 11.5–15.5)
Smear Review: NORMAL
WBC: 29.3 10*3/uL — ABNORMAL HIGH (ref 4.0–10.5)
nRBC: 0.1 % (ref 0.0–0.2)

## 2023-06-22 LAB — LACTIC ACID, PLASMA: Lactic Acid, Venous: 1.2 mmol/L (ref 0.5–1.9)

## 2023-06-22 LAB — LIPASE, BLOOD: Lipase: 43 U/L (ref 11–51)

## 2023-06-22 MED ORDER — ACETAMINOPHEN 325 MG PO TABS: 650.0000 mg | ORAL_TABLET | Freq: Four times a day (QID) | ORAL | Status: DC | PRN

## 2023-06-22 MED ORDER — ONDANSETRON HCL 4 MG/2ML IJ SOLN: 4.0000 mg | Freq: Four times a day (QID) | INTRAMUSCULAR | Status: DC | PRN

## 2023-06-22 MED ORDER — IOHEXOL 300 MG/ML  SOLN
75.0000 mL | Freq: Once | INTRAMUSCULAR | Status: AC | PRN
  Administered 2023-06-22: 75 mL via INTRAVENOUS

## 2023-06-22 MED ORDER — ONDANSETRON HCL 4 MG PO TABS: 4.0000 mg | ORAL_TABLET | Freq: Four times a day (QID) | ORAL | Status: DC | PRN

## 2023-06-22 MED ORDER — METOPROLOL SUCCINATE ER 25 MG PO TB24
12.5000 mg | ORAL_TABLET | Freq: Every day | ORAL | Status: DC
Start: 2023-06-23 — End: 2023-06-23

## 2023-06-22 MED ORDER — APIXABAN 2.5 MG PO TABS
2.5000 mg | ORAL_TABLET | Freq: Two times a day (BID) | ORAL | Status: DC
  Administered 2023-06-23 (×2): 2.5 mg via ORAL
  Filled 2023-06-22 (×2): qty 1

## 2023-06-22 MED ORDER — SODIUM CHLORIDE 0.9 % IV SOLN
3.0000 g | Freq: Once | INTRAVENOUS | Status: AC
  Administered 2023-06-22: 3 g via INTRAVENOUS
  Filled 2023-06-22: qty 8

## 2023-06-22 MED ORDER — ADULT MULTIVITAMIN W/MINERALS CH
ORAL_TABLET | Freq: Every day | ORAL | Status: DC
  Administered 2023-06-23 – 2023-06-24 (×2): 1 via ORAL
  Filled 2023-06-22 (×3): qty 1

## 2023-06-22 MED ORDER — ACETAMINOPHEN 650 MG RE SUPP: 650.0000 mg | Freq: Four times a day (QID) | RECTAL | Status: DC | PRN

## 2023-06-22 MED ORDER — LORATADINE 10 MG PO TABS
10.0000 mg | ORAL_TABLET | Freq: Every day | ORAL | Status: DC
  Administered 2023-06-23 – 2023-06-26 (×4): 10 mg via ORAL
  Filled 2023-06-22 (×4): qty 1

## 2023-06-22 MED ORDER — ONDANSETRON HCL 4 MG/2ML IJ SOLN
4.0000 mg | Freq: Once | INTRAMUSCULAR | Status: AC
  Administered 2023-06-22: 4 mg via INTRAVENOUS
  Filled 2023-06-22: qty 2

## 2023-06-22 MED ORDER — SODIUM CHLORIDE 0.9 % IV SOLN: INTRAVENOUS | Status: AC

## 2023-06-22 MED ORDER — TRAZODONE HCL 50 MG PO TABS: 25.0000 mg | ORAL_TABLET | Freq: Every evening | ORAL | Status: DC | PRN

## 2023-06-22 MED ORDER — LACTATED RINGERS IV BOLUS
1000.0000 mL | Freq: Once | INTRAVENOUS | Status: AC
  Administered 2023-06-22: 1000 mL via INTRAVENOUS

## 2023-06-22 MED ORDER — AMLODIPINE BESYLATE 5 MG PO TABS: 5.0000 mg | ORAL_TABLET | Freq: Every day | ORAL | Status: DC

## 2023-06-22 NOTE — ED Triage Notes (Signed)
First nurse note: Pt here via EMS from [facility] with c/o of ABD pain. Pt here for N/V/D. Pt states stool is more dark than normal, pt does take blood thinner.   4 mg IV given by EMS 20L AC.   168/74 HR: 90 100% RA

## 2023-06-22 NOTE — ED Provider Triage Note (Signed)
 Emergency Medicine Provider Triage Evaluation Note  Tiffany Martinez , a 89 y.o. female  was evaluated in triage.  Pt complains of nauseous, vomit, diarrhea, with black stools.  Review of Systems  Positive:  Negative:   Physical Exam  BP (!) 139/105 (BP Location: Left Arm)   Pulse 82   Temp 98.1 F (36.7 C) (Oral)   Resp 17   LMP  (LMP Unknown)   SpO2 100%  Gen:   Awake, no distress   Resp:  Normal effort  MSK:   Moves extremities without difficulty  Other:  Abdomen tender to palpation  Medical Decision Making  Medically screening exam initiated at 7:02 PM.  Appropriate orders placed.  Powell HERO Coulson was informed that the remainder of the evaluation will be completed by another provider, this initial triage assessment does not replace that evaluation, and the importance of remaining in the ED until their evaluation is complete.  Patient with abdominal pain, nauseous vomiting diarrhea, ordered CBC CMP Zofran  IV fluids CT scan   Janit Kast, PA-C 06/22/23 1903

## 2023-06-22 NOTE — ED Notes (Signed)
 Ultrasound at bedside

## 2023-06-22 NOTE — ED Provider Notes (Signed)
 [facility] Provider Note    Event Date/Time   First MD Initiated Contact with Patient 06/22/23 2109     (approximate)   History   Chief Complaint Abdominal Pain   HPI  Tiffany Martinez is a 89 y.o. female with past medical history of hypertension, atrial fibrillation on Eliquis , CKD, PAD, and CLL who presents to the ED complaining of abdominal pain.  Patient reports that over the past 2 days she has been dealing with persistent nausea, vomiting, and diarrhea he has been concerned because her stool has appeared very dark in color, but she has not noticed any blood.  She reports some diffuse crampy abdominal pain but has not had any fevers, dysuria, or flank pain.  She has been taking Augmentin  as prescribed following recent admission for Clostridium bacteremia from unknown source.  She was told to return to the hospital for any worsening symptoms.     Physical Exam   Triage Vital Signs: ED Triage Vitals  Encounter Vitals Group     BP 06/22/23 1900 (!) 139/105     Systolic BP Percentile --      Diastolic BP Percentile --      Pulse Rate 06/22/23 1900 82     Resp 06/22/23 1900 17     Temp 06/22/23 1900 98.1 F (36.7 C)     Temp Source 06/22/23 1900 Oral     SpO2 06/22/23 1900 100 %     Weight 06/22/23 1904 137 lb (62.1 kg)     Height 06/22/23 1904 5' 4 (1.626 m)     Head Circumference --      Peak Flow --      Pain Score 06/22/23 1904 9     Pain Loc --      Pain Education --      Exclude from Growth Chart --     Most recent vital signs: Vitals:   06/22/23 2230 06/22/23 2300  BP: (!) 198/54 (!) 200/50  Pulse: 60 63  Resp: 17 16  Temp:    SpO2: 100% 100%    Constitutional: Alert and oriented. Eyes: Conjunctivae are normal. Head: Atraumatic. Nose: No congestion/rhinnorhea. Mouth/Throat: Mucous membranes are moist.  Cardiovascular: Normal rate, regular rhythm. Grossly normal heart sounds.  2+ radial pulses bilaterally. Respiratory:  Normal respiratory effort.  No retractions. Lungs CTAB. Gastrointestinal: Soft and nontender. No distention. Musculoskeletal: No lower extremity tenderness nor edema.  Neurologic:  Normal speech and language. No gross focal neurologic deficits are appreciated.    ED Results / Procedures / Treatments   Labs (all labs ordered are listed, but only abnormal results are displayed) Labs Reviewed  CBC WITH DIFFERENTIAL/PLATELET - Abnormal; Notable for the following components:      Result Value   WBC 29.3 (*)    RBC 2.57 (*)    Hemoglobin 10.3 (*)    HCT 28.8 (*)    MCV 112.1 (*)    MCH 40.1 (*)    Platelets 146 (*)    Lymphs Abs 22.4 (*)    Abs Immature Granulocytes 0.13 (*)    All other components within normal limits  COMPREHENSIVE METABOLIC PANEL - Abnormal; Notable for the following components:   Glucose, Bld 139 (*)    BUN 27 (*)    Creatinine, Ser 1.63 (*)    Total Protein 6.0 (*)    GFR, Estimated 30 (*)    All other components within normal limits  CULTURE, BLOOD (ROUTINE X 2)  CULTURE,  BLOOD (ROUTINE X 2)  LIPASE, BLOOD  PATHOLOGIST SMEAR REVIEW  LACTIC ACID, PLASMA  LACTIC ACID, PLASMA  BASIC METABOLIC PANEL  CBC    RADIOLOGY CT abdomen/pelvis reviewed and interpreted by me with no inflammatory changes, focal fluid collections, or dilated bowel loops.  PROCEDURES:  Critical Care performed: No  Procedures   MEDICATIONS ORDERED IN ED: Medications  amLODipine  (NORVASC ) tablet 5 mg (has no administration in time range)  metoprolol  succinate (TOPROL -XL) 24 hr tablet 12.5 mg (has no administration in time range)  apixaban  (ELIQUIS ) tablet 2.5 mg (has no administration in time range)  multivitamin with minerals tablet (has no administration in time range)  loratadine  (CLARITIN ) tablet 10 mg (has no administration in time range)  0.9 %  sodium chloride  infusion (has no administration in time range)  acetaminophen  (TYLENOL ) tablet 650 mg (has no administration in  time range)    Or  acetaminophen  (TYLENOL ) suppository 650 mg (has no administration in time range)  traZODone  (DESYREL ) tablet 25 mg (has no administration in time range)  ondansetron  (ZOFRAN ) tablet 4 mg (has no administration in time range)    Or  ondansetron  (ZOFRAN ) injection 4 mg (has no administration in time range)  iohexol  (OMNIPAQUE ) 300 MG/ML solution 75 mL (75 mLs Intravenous Contrast Given 06/22/23 1946)  lactated ringers  bolus 1,000 mL (0 mLs Intravenous Stopped 06/22/23 2229)  ondansetron  (ZOFRAN ) injection 4 mg (4 mg Intravenous Given 06/22/23 2134)  Ampicillin -Sulbactam (UNASYN ) 3 g in sodium chloride  0.9 % 100 mL IVPB (3 g Intravenous New Bag/Given 06/22/23 2251)     IMPRESSION / MDM / ASSESSMENT AND PLAN / ED COURSE  I reviewed the triage vital signs and the nursing notes.                              89 y.o. female with past medical history of hypertension, atrial fibrillation on Eliquis , CKD, CLL, PAD, and recent admission for Clostridium bacteremia who presents to the ED complaining of nausea, vomiting, diarrhea, and crampy abdominal pain.  Patient's presentation is most consistent with acute presentation with potential threat to life or bodily function.  Differential diagnosis includes, but is not limited to, sepsis, gastroenteritis, dehydration, GI bleed, electrolyte abnormality, anemia.  Patient nontoxic-appearing and in no acute distress, vital signs are unremarkable.  Her abdomen is soft and nontender to palpation, CT imaging obtained from triage is remarkable only for layering gallstones versus sludge, but no findings concerning for cholecystitis.  This was further assessed with right upper quadrant ultrasound, which again shows gallstones without evidence of cholecystitis.  Her hemoglobin is improved compared to most recent admission, doubt significant GI bleeding at this time.  She does have significant leukocytosis beyond her baseline, would be concern for recurrent  sepsis and bacteremia, especially given her CLL and immunocompromise.  Renal function comparable to previous with no acute electrolyte abnormality or LFT abnormality.  We will treat with IV Unasyn , case discussed with hospitalist for admission.      FINAL CLINICAL IMPRESSION(S) / ED DIAGNOSES   Final diagnoses:  Gastroenteritis  Bacteremia due to Clostridium species     Rx / DC Orders   ED Discharge Orders     None        Note:  This document was prepared using Dragon voice recognition software and may include unintentional dictation errors.   Willo Dunnings, MD 06/22/23 [phone]

## 2023-06-22 NOTE — H&P (Addendum)
 [place]   PATIENT NAME: Tiffany Martinez    MR#:  [id number]  DATE OF BIRTH:  [birth year]  DATE OF ADMISSION:  06/22/2023  PRIMARY CARE PHYSICIAN: Abdul Fine, MD   Patient is coming from: Home  REQUESTING/REFERRING PHYSICIAN: Willo Dunnings, MD  CHIEF COMPLAINT:   Chief Complaint  Patient presents with   Abdominal Pain    HISTORY OF PRESENT ILLNESS:  Tiffany Martinez is a 89 y.o. female with medical history significant for anxiety, CHF, CLL, hypertension, dyslipidemia, migraine, PAD, paroxysmal atrial fibrillation, who presented to the emergency room with acute onset of recurrent nausea, vomiting with diarrhea over the last day with associated abdominal cramps.  She denied any fever or chills.  No dysuria, oliguria or hematuria or flank pain.  No chest pain however she experienced palpitations.  No cough or wheezing or dyspnea.  She has been feeling weak and tired.  The patient was recently admitted for Clostridium bacteremia and continues antibiotic therapy with p.o. Augmentin .  ED Course: When she came to the ER, BP was 139/105 with otherwise normal vital signs.  Labs revealed BUN of 27 creatinine 1.63 with otherwise unremarkable CMP.  Lactic acid was 1.2 and later 1.1.  CBC showed leukocytosis 29.3 with lymphocytosis and mild thrombocytopenia 146, anemia with hemoglobin 10.3 hematocrit 28.8 close to previous levels. EKG as reviewed by me : None Imaging: CT of the abdomen and pelvis with contrast revealed the following: 1. No acute process demonstrated in the abdomen or pelvis. No evidence of bowel obstruction or inflammation. 2. Chronic atrophy of the right kidney. 3. Layering stones or sludge in the gallbladder without evidence of acute cholecystitis. 4. Aortic atherosclerosis. Postoperative changes of aortoiliac stent graft.  Right upper quadrant ultrasound revealed cholelithiasis without cholecystitis.  The patient was given 1 L bolus of IV lactated  Ringer, 4 mg of IV Zofran  and 3 g of IV Unasyn .  She will be admitted to a medical telemetry observation bed for further evaluation and management. PAST MEDICAL HISTORY:   Past Medical History:  Diagnosis Date   AAA (abdominal aortic aneurysm) (HCC) 2012   3.9cm 2012; 2.8cm 12/2021   Anemia    Anxiety    CHF (congestive heart failure) (HCC)    LVEF 60-65% in 2023   Chronic venous insufficiency    CLL (chronic lymphocytic leukemia) (HCC) 2014   Stage 0--by flow cytometry   Hx of colonoscopy    Hyperlipidemia    Hypertension    Melanoma (HCC)    Right Arm   Migraine headache    3-4X/yr   PAD (peripheral artery disease) (HCC) 2012   severe R common iliac stenosis, ABI 0.73, mid L common iliac stenosis ABI 1.1   PAD (peripheral artery disease) (HCC)    Paroxysmal atrial fibrillation (HCC) 12/2021   Wears dentures    full upper and lower    PAST SURGICAL HISTORY:   Past Surgical History:  Procedure Laterality Date   ABDOMINAL AORTIC ANEURYSM REPAIR  02/16/12   [facility]--Dr Missy   CARDIOVASCULAR STRESS TEST  2008   negative   CATARACT EXTRACTION W/PHACO Left 07/16/2019   Procedure: CATARACT EXTRACTION PHACO AND INTRAOCULAR LENS PLACEMENT (IOC) LEFT 8.26  00:46.6;  Surgeon: Myrna Adine Anes, MD;  Location: [facility];  Service: Ophthalmology;  Laterality: Left;   DOBUTAMINE  STRESS ECHO  11/11   negative   HIP ARTHROPLASTY Right 10/10/2019   Procedure: ARTHROPLASTY BIPOLAR HIP (HEMIARTHROPLASTY);  Surgeon: Edie Norleen PARAS, MD;  Location: [facility];  Service: Orthopedics;  Laterality: Right;   KNEE ARTHROSCOPY  1998   right   MELANOMA EXCISION  11/09/10   wide excision right upper arm   SQUAMOUS CELL CARCINOMA EXCISION  02/2016   TEE WITHOUT CARDIOVERSION N/A 06/15/2023   Procedure: TRANSESOPHAGEAL ECHOCARDIOGRAM (TEE);  Surgeon: Gollan, Timothy J, MD;  Location: [facility];  Service: Cardiovascular;  Laterality: N/A;   TONSILLECTOMY AND ADENOIDECTOMY  1960    SOCIAL HISTORY:    Social History   Tobacco Use   Smoking status: Every Day    Current packs/day: 0.00    Types: Cigarettes    Last attempt to quit: 03/17/2021    Years since quitting: 2.2    Passive exposure: Never   Smokeless tobacco: Never   Tobacco comments:    occassionally  Substance Use Topics   Alcohol use: Yes    Alcohol/week: 7.0 standard drinks of alcohol    Types: 7 Glasses of wine per week    Comment: regular wine with dinner    FAMILY HISTORY:   Family History  Problem Relation Age of Onset   Alzheimer's disease Mother    Pancreatic cancer Father    Parkinson's disease Brother    Coronary artery disease Neg Hx    Diabetes Neg Hx    Cancer Neg Hx        breast or colon    DRUG ALLERGIES:   Allergies  Allergen Reactions   Triamterene  Hives    REVIEW OF SYSTEMS:   ROS As per history of present illness. All pertinent systems were reviewed above. Constitutional, HEENT, cardiovascular, respiratory, GI, GU, musculoskeletal, neuro, psychiatric, endocrine, integumentary and hematologic systems were reviewed and are otherwise negative/unremarkable except for positive findings mentioned above in the HPI.   MEDICATIONS AT HOME:   Prior to Admission medications   Medication Sig Start Date End Date Taking? Authorizing Provider  amLODipine  (NORVASC ) 5 MG tablet Take 1 tablet (5 mg total) by mouth daily. 10/01/22   Abdul Fine, MD  amoxicillin -clavulanate (AUGMENTIN ) 500-125 MG tablet Take 1 tablet by mouth 2 (two) times daily for 21 doses. 06/16/23 06/27/23  Fayette Bodily, MD  apixaban  (ELIQUIS ) 2.5 MG TABS tablet TAKE 1 TABLET BY MOUTH TWICE DAILY 07/07/22   Gollan, Timothy J, MD  loratadine  (CLARITIN ) 10 MG tablet Take 10 mg by mouth daily.    [provider]  losartan  (COZAAR ) 100 MG tablet Take 1 tablet (100 mg total) by mouth daily. 10/01/22   Abdul Fine, MD  metoprolol  succinate (TOPROL -XL) 25 MG 24 hr tablet Take 0.5 tablets (12.5 mg total) by mouth  daily. 10/01/22   Abdul Fine, MD  Multiple Vitamin (MULTIVITAMIN ADULT PO) Take 1 tablet by mouth daily.    [provider]      VITAL SIGNS:  Blood pressure (!) 155/52, pulse (!) 59, temperature 98.1 F (36.7 C), temperature source Oral, resp. rate 16, height 5' 4 (1.626 m), weight 62.1 kg, SpO2 99%.  PHYSICAL EXAMINATION:  Physical Exam  GENERAL:  89 y.o.-year-old Caucasian female patient lying in the bed with no acute distress.  EYES: Pupils equal, round, reactive to light and accommodation. No scleral icterus. Extraocular muscles intact.  HEENT: Head atraumatic, normocephalic. Oropharynx and nasopharynx clear.  NECK:  Supple, no jugular venous distention. No thyroid  enlargement, no tenderness.  LUNGS: Normal breath sounds bilaterally, no wheezing, rales,rhonchi or crepitation. No use of accessory muscles of respiration.  CARDIOVASCULAR: Regular rate and rhythm, S1, S2 normal. No murmurs, rubs, or  gallops.  ABDOMEN: Soft, nondistended, nontender. Bowel sounds present. No organomegaly or mass.  EXTREMITIES: No pedal edema, cyanosis, or clubbing.  NEUROLOGIC: Cranial nerves II through XII are intact. Muscle strength 5/5 in all extremities. Sensation intact. Gait not checked.  PSYCHIATRIC: The patient is alert and oriented x 3.  Normal affect and good eye contact. SKIN: No obvious rash, lesion, or ulcer.   LABORATORY PANEL:   CBC Recent Labs  Lab 06/22/23 1906  WBC 29.3*  HGB 10.3*  HCT 28.8*  PLT 146*   ------------------------------------------------------------------------------------------------------------------  Chemistries  Recent Labs  Lab 06/22/23 1906  NA 141  K 4.5  CL 110  CO2 22  GLUCOSE 139*  BUN 27*  CREATININE 1.63*  CALCIUM  9.5  AST 24  ALT 17  ALKPHOS 56  BILITOT 0.6   ------------------------------------------------------------------------------------------------------------------  Cardiac Enzymes No results for input(s):  TROPONINI in the last 168 hours. ------------------------------------------------------------------------------------------------------------------  RADIOLOGY:  US  ABDOMEN LIMITED RUQ (LIVER/GB) Result Date: 06/22/2023 CLINICAL DATA:  Cholecystitis.  Abdominal pain EXAM: ULTRASOUND ABDOMEN LIMITED RIGHT UPPER QUADRANT COMPARISON:  CT 06/22/2023 FINDINGS: Gallbladder: Multiple small stones layering in the gallbladder. No gallbladder wall thickening or edema. Murphy's sign is negative. Common bile duct: Diameter: 4 mm, normal Liver: No focal lesion identified. Within normal limits in parenchymal echogenicity. Portal vein is patent on color Doppler imaging with normal direction of blood flow towards the liver. Other: Right renal atrophy. Increased echotexture consistent with medical renal disease. IMPRESSION: Cholelithiasis. No additional changes to suggest acute cholecystitis. Electronically Signed   By: Elsie Gravely M.D.   On: 06/22/2023 22:30   CT ABDOMEN PELVIS W CONTRAST Result Date: 06/22/2023 CLINICAL DATA:  Acute nonlocalized abdominal pain. Nausea, vomiting, and diarrhea. Dark stools. EXAM: CT ABDOMEN AND PELVIS WITH CONTRAST TECHNIQUE: Multidetector CT imaging of the abdomen and pelvis was performed using the standard protocol following bolus administration of intravenous contrast. RADIATION DOSE REDUCTION: This exam was performed according to the departmental dose-optimization program which includes automated exposure control, adjustment of the mA and/or kV according to patient size and/or use of iterative reconstruction technique. CONTRAST:  75mL OMNIPAQUE  IOHEXOL  300 MG/ML  SOLN COMPARISON:  PET-CT 06/15/2023. CT chest abdomen and pelvis 06/13/2023. FINDINGS: Lower chest: Linear scarring in the lung bases. Hepatobiliary: Layering stones or sludge in the gallbladder. No gallbladder wall thickening. Bile ducts are not dilated. No focal liver lesions. Pancreas: Unremarkable. No pancreatic ductal  dilatation or surrounding inflammatory changes. Spleen: Normal in size without focal abnormality. Adrenals/Urinary Tract: No adrenal gland nodules. Prominent right renal atrophy. No hydronephrosis or hydroureter. Bladder is normal. Bilateral renal cysts. Largest on the left measures 1.6 cm diameter. No imaging follow-up is indicated. Stomach/Bowel: Stomach, small bowel, and colon are not abnormally distended. Stool fills the colon. Colonic diverticulosis without evidence of acute diverticulitis. No wall thickening or inflammatory changes. Appendix is normal. Vascular/Lymphatic: Aortic calcification. 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 Stent graft is patent. No significant lymphadenopathy. Reproductive: Large calcification posterior to the uterus likely representing calcified fibroid. No abnormal adnexal masses. Other: No free air or free fluid. Abdominal wall musculature appears intact. Musculoskeletal: Postoperative right hip hemiarthroplasty. Mild degenerative changes in the spine. IMPRESSION: 1. No acute process demonstrated in the abdomen or pelvis. No evidence of bowel obstruction or inflammation. 2. Chronic atrophy of the right kidney. 3. Layering stones or sludge in the gallbladder without evidence of acute cholecystitis. 4. Aortic atherosclerosis. Postoperative changes of aortoiliac stent graft. Electronically Signed   By: Elsie Gravely  M.D.   On: 06/22/2023 20:12      IMPRESSION AND PLAN:  Assessment and Plan: * Acute gastroenteritis - The patient will be admitted to an observation medical telemetry bed. - We will continue hydration with IV normal saline. - Antiemetics and antidiarrheals will be provided. - This is likely a viral etiology. - Given her recent history of Clostridium bacteremia and as she has been taking Augmentin , we will continue Unasyn  and check stool C. difficile and stool pathogens.  Cholelithiasis - She will need surgical follow-up upon  discharge.  Essential hypertension - We will continue antihypertensive therapy.  Paroxysmal atrial fibrillation (HCC) - We will continue Eliquis  and Toprol -XL.  CLL (chronic lymphocytic leukemia) (HCC) - Will monitor CBC with differential.       DVT prophylaxis: Eliquis . Advanced Care Planning:  Code Status: The patient is DNR/DNI.  This was discussed with her. Family Communication:  The plan of care was discussed in details with the patient (and family). I answered all questions. The patient agreed to proceed with the above mentioned plan. Further management will depend upon hospital course. Disposition Plan: Back to previous home environment Consults called: none. All the records are reviewed and case discussed with ED provider.  Status is: Observation  I certify that at the time of admission, it is my clinical judgment that the patient will require  hospital care extending less than 2 midnights.                            Dispo: The patient is from: Home              Anticipated d/c is to: Home              Patient currently is not medically stable to d/c.              Difficult to place patient: No  Madison DELENA Peaches M.D on 06/23/2023 at 4:49 AM  [facility]   From 7 PM-7 AM, contact night-coverage [email or web address]  CC: Primary care physician; Abdul Fine, MD

## 2023-06-23 ENCOUNTER — Other Ambulatory Visit: Payer: Self-pay

## 2023-06-23 ENCOUNTER — Encounter: Payer: Self-pay | Admitting: Family Medicine

## 2023-06-23 DIAGNOSIS — I480 Paroxysmal atrial fibrillation: Secondary | ICD-10-CM | POA: Diagnosis present

## 2023-06-23 DIAGNOSIS — D62 Acute posthemorrhagic anemia: Secondary | ICD-10-CM | POA: Diagnosis present

## 2023-06-23 DIAGNOSIS — D122 Benign neoplasm of ascending colon: Secondary | ICD-10-CM | POA: Diagnosis present

## 2023-06-23 DIAGNOSIS — K5289 Other specified noninfective gastroenteritis and colitis: Secondary | ICD-10-CM | POA: Diagnosis not present

## 2023-06-23 DIAGNOSIS — R7881 Bacteremia: Secondary | ICD-10-CM

## 2023-06-23 DIAGNOSIS — N1832 Chronic kidney disease, stage 3b: Secondary | ICD-10-CM | POA: Diagnosis present

## 2023-06-23 DIAGNOSIS — I5032 Chronic diastolic (congestive) heart failure: Secondary | ICD-10-CM | POA: Diagnosis present

## 2023-06-23 DIAGNOSIS — K635 Polyp of colon: Secondary | ICD-10-CM | POA: Diagnosis not present

## 2023-06-23 DIAGNOSIS — K648 Other hemorrhoids: Secondary | ICD-10-CM | POA: Diagnosis not present

## 2023-06-23 DIAGNOSIS — K589 Irritable bowel syndrome without diarrhea: Secondary | ICD-10-CM | POA: Diagnosis not present

## 2023-06-23 DIAGNOSIS — B9689 Other specified bacterial agents as the cause of diseases classified elsewhere: Secondary | ICD-10-CM

## 2023-06-23 DIAGNOSIS — K2961 Other gastritis with bleeding: Secondary | ICD-10-CM | POA: Diagnosis present

## 2023-06-23 DIAGNOSIS — K5730 Diverticulosis of large intestine without perforation or abscess without bleeding: Secondary | ICD-10-CM | POA: Diagnosis not present

## 2023-06-23 DIAGNOSIS — K8020 Calculus of gallbladder without cholecystitis without obstruction: Secondary | ICD-10-CM

## 2023-06-23 DIAGNOSIS — K2980 Duodenitis without bleeding: Secondary | ICD-10-CM | POA: Diagnosis not present

## 2023-06-23 DIAGNOSIS — D126 Benign neoplasm of colon, unspecified: Secondary | ICD-10-CM | POA: Diagnosis not present

## 2023-06-23 DIAGNOSIS — R6881 Early satiety: Secondary | ICD-10-CM | POA: Diagnosis present

## 2023-06-23 DIAGNOSIS — F17210 Nicotine dependence, cigarettes, uncomplicated: Secondary | ICD-10-CM | POA: Diagnosis present

## 2023-06-23 DIAGNOSIS — D128 Benign neoplasm of rectum: Secondary | ICD-10-CM | POA: Diagnosis present

## 2023-06-23 DIAGNOSIS — Z66 Do not resuscitate: Secondary | ICD-10-CM | POA: Diagnosis present

## 2023-06-23 DIAGNOSIS — K640 First degree hemorrhoids: Secondary | ICD-10-CM | POA: Diagnosis not present

## 2023-06-23 DIAGNOSIS — Z961 Presence of intraocular lens: Secondary | ICD-10-CM | POA: Diagnosis present

## 2023-06-23 DIAGNOSIS — K319 Disease of stomach and duodenum, unspecified: Secondary | ICD-10-CM | POA: Diagnosis present

## 2023-06-23 DIAGNOSIS — K529 Noninfective gastroenteritis and colitis, unspecified: Secondary | ICD-10-CM | POA: Diagnosis present

## 2023-06-23 DIAGNOSIS — K5790 Diverticulosis of intestine, part unspecified, without perforation or abscess without bleeding: Secondary | ICD-10-CM | POA: Diagnosis not present

## 2023-06-23 DIAGNOSIS — K2981 Duodenitis with bleeding: Secondary | ICD-10-CM | POA: Diagnosis present

## 2023-06-23 DIAGNOSIS — D6959 Other secondary thrombocytopenia: Secondary | ICD-10-CM | POA: Diagnosis present

## 2023-06-23 DIAGNOSIS — I130 Hypertensive heart and chronic kidney disease with heart failure and stage 1 through stage 4 chronic kidney disease, or unspecified chronic kidney disease: Secondary | ICD-10-CM | POA: Diagnosis present

## 2023-06-23 DIAGNOSIS — C9110 Chronic lymphocytic leukemia of B-cell type not having achieved remission: Secondary | ICD-10-CM | POA: Diagnosis present

## 2023-06-23 DIAGNOSIS — I714 Abdominal aortic aneurysm, without rupture, unspecified: Secondary | ICD-10-CM | POA: Diagnosis present

## 2023-06-23 DIAGNOSIS — D61818 Other pancytopenia: Secondary | ICD-10-CM | POA: Diagnosis present

## 2023-06-23 DIAGNOSIS — K2990 Gastroduodenitis, unspecified, without bleeding: Secondary | ICD-10-CM | POA: Diagnosis not present

## 2023-06-23 DIAGNOSIS — I739 Peripheral vascular disease, unspecified: Secondary | ICD-10-CM | POA: Diagnosis present

## 2023-06-23 DIAGNOSIS — K3189 Other diseases of stomach and duodenum: Secondary | ICD-10-CM | POA: Diagnosis not present

## 2023-06-23 DIAGNOSIS — K559 Vascular disorder of intestine, unspecified: Secondary | ICD-10-CM | POA: Diagnosis present

## 2023-06-23 DIAGNOSIS — I10 Essential (primary) hypertension: Secondary | ICD-10-CM | POA: Insufficient documentation

## 2023-06-23 DIAGNOSIS — D630 Anemia in neoplastic disease: Secondary | ICD-10-CM | POA: Diagnosis present

## 2023-06-23 DIAGNOSIS — K2289 Other specified disease of esophagus: Secondary | ICD-10-CM | POA: Diagnosis present

## 2023-06-23 LAB — LACTIC ACID, PLASMA: Lactic Acid, Venous: 1.1 mmol/L (ref 0.5–1.9)

## 2023-06-23 LAB — BASIC METABOLIC PANEL
Anion gap: 7 (ref 5–15)
BUN: 23 mg/dL (ref 8–23)
CO2: 22 mmol/L (ref 22–32)
Calcium: 8.9 mg/dL (ref 8.9–10.3)
Chloride: 111 mmol/L (ref 98–111)
Creatinine, Ser: 1.4 mg/dL — ABNORMAL HIGH (ref 0.44–1.00)
GFR, Estimated: 36 mL/min — ABNORMAL LOW (ref >60)
Glucose, Bld: 96 mg/dL (ref 70–99)
Potassium: 3.7 mmol/L (ref 3.5–5.1)
Sodium: 140 mmol/L (ref 135–145)

## 2023-06-23 LAB — CBC
HCT: 23.8 % — ABNORMAL LOW (ref 36.0–46.0)
Hemoglobin: 8.7 g/dL — ABNORMAL LOW (ref 12.0–15.0)
MCH: 40.5 pg — ABNORMAL HIGH (ref 26.0–34.0)
MCHC: 36.6 g/dL — ABNORMAL HIGH (ref 30.0–36.0)
MCV: 110.7 fL — ABNORMAL HIGH (ref 80.0–100.0)
Platelets: 113 10*3/uL — ABNORMAL LOW (ref 150–400)
RBC: 2.15 MIL/uL — ABNORMAL LOW (ref 3.87–5.11)
RDW: 14.6 % (ref 11.5–15.5)
WBC: 16.6 10*3/uL — ABNORMAL HIGH (ref 4.0–10.5)
nRBC: 0 % (ref 0.0–0.2)

## 2023-06-23 LAB — PATHOLOGIST SMEAR REVIEW

## 2023-06-23 MED ORDER — ACETAMINOPHEN 325 MG PO TABS
650.0000 mg | ORAL_TABLET | Freq: Four times a day (QID) | ORAL | 0 refills | Status: AC | PRN
  Filled 2023-06-23: qty 30, 4d supply, fill #0

## 2023-06-23 MED ORDER — AMLODIPINE BESYLATE 10 MG PO TABS
10.0000 mg | ORAL_TABLET | Freq: Every day | ORAL | Status: DC
  Administered 2023-06-23 – 2023-06-26 (×4): 10 mg via ORAL
  Filled 2023-06-23: qty 2
  Filled 2023-06-23 (×3): qty 1

## 2023-06-23 MED ORDER — SODIUM CHLORIDE 0.9 % IV SOLN
3.0000 g | Freq: Two times a day (BID) | INTRAVENOUS | Status: DC
  Administered 2023-06-23 – 2023-06-26 (×7): 3 g via INTRAVENOUS
  Filled 2023-06-23 (×8): qty 8

## 2023-06-23 NOTE — ED Notes (Addendum)
 Helped pt to bathroom. Pt able to ambulate with walker

## 2023-06-23 NOTE — Assessment & Plan Note (Signed)
-   The patient will be admitted to an observation medical telemetry bed. - We will continue hydration with IV normal saline. - Antiemetics and antidiarrheals will be provided. - This is likely a viral etiology. - Given her recent history of Clostridium bacteremia and as she has been taking Augmentin , we will continue Unasyn  and check stool C. difficile and stool pathogens.

## 2023-06-23 NOTE — Assessment & Plan Note (Signed)
-   We will continue antihypertensive therapy.

## 2023-06-23 NOTE — Discharge Instructions (Addendum)
 Hold Eliquis  for 2 days, restart on 2/11. Follow-up with PCP in 1 week. Follow-up with GI in 2 weeks.

## 2023-06-23 NOTE — Consult Note (Signed)
 NAME: Tiffany Martinez  DOB: 1934-02-28  MRN: [id number]  Date/Time: 06/23/2023 5:05 PM  REQUESTING PROVIDER: Dr. Lenon Subjective:  REASON FOR CONSULT: Anaerobic bacteremia ? Tiffany Martinez is a 89 y.o. female with a history of CLL, hypertension, atrial fibrillation on Eliquis , CKD, chronic mesenteric ischemia, PAD, anxiety was recently in the hospital between 06/10/23-06/16/23 after a near syncopal episode. Her blood culture showed Clostridium ramosum bacteremia both sets Patient has infrarenal abdominal aortic aneurysm and is an EVAR for it in October 2013 with iliac stent graft.  The source was unclear for the Clostridium even though this could be a GI organism so patient underwent CAT scan of the abdomen chest and pelvis which was essentially normal and she had a PET scan  and that was  normal.  .CT angio of the abdomen and pelvis and chest revealed advanced aortic iliac atherosclerotic disease.  Post prior aortobiiliac biiliac endovascular stent graft repair of infrarenal abdominal aortic aneurysm the stent was patent.  No contrast outside the confines of the stent graft.  No periaortic fluid collection And a TEE which was normal.  As patient did not want a colonoscopy or an endoscopy and she did not want IV antibiotics she was sent home on p.o. Augmentin  after receiving IV Unasyn  in the hospital.  She is back to the hospital after an explosive bowel movement which was reddish-brown in color and she was worried that it could be blood.  She also had abdominal pains and nausea and vomiting.  She did not have any fever or chills. In the ED y vitals were BP of 138/64, temperature 98.1, pulse 62, sats 98%.  WBC was 29.3, Hb 10.3, platelet 146 and creatinine of 1.63. CT abdomen pelvis revealed Stomach bubble and colon to be normal was colonic diverticulosis evidence of acute diverticulitis.  Aortic calcification was seen and aorto by iliac stent graft with collapsed native aneurysmal sac.  Large  calcified posterior to the uterus was presenting possibly a calcified fibroid.  There was chronic atrophy of the right kidney. I am asked to see the patient as she has Clostridium ramosum bacteremia.  I Past Medical History:  Diagnosis Date   AAA (abdominal aortic aneurysm) (HCC) 2012   3.9cm 2012; 2.8cm 12/2021   Anemia    Anxiety    CHF (congestive heart failure) (HCC)    LVEF 60-65% in 2023   Chronic venous insufficiency    CLL (chronic lymphocytic leukemia) (HCC) 2014   Stage 0--by flow cytometry   Hx of colonoscopy    Hyperlipidemia    Hypertension    Melanoma (HCC)    Right Arm   Migraine headache    3-4X/yr   PAD (peripheral artery disease) (HCC) 2012   severe R common iliac stenosis, ABI 0.73, mid L common iliac stenosis ABI 1.1   PAD (peripheral artery disease) (HCC)    Paroxysmal atrial fibrillation (HCC) 12/2021   Wears dentures    full upper and lower    Past Surgical History:  Procedure Laterality Date   ABDOMINAL AORTIC ANEURYSM REPAIR  02/16/12   [facility]--Dr Missy   CARDIOVASCULAR STRESS TEST  2008   negative   CATARACT EXTRACTION W/PHACO Left 07/16/2019   Procedure: CATARACT EXTRACTION PHACO AND INTRAOCULAR LENS PLACEMENT (IOC) LEFT 8.26  00:46.6;  Surgeon: Myrna Adine Anes, MD;  Location: [facility];  Service: Ophthalmology;  Laterality: Left;   DOBUTAMINE  STRESS ECHO  11/11   negative   HIP ARTHROPLASTY Right 10/10/2019   Procedure: ARTHROPLASTY  BIPOLAR HIP (HEMIARTHROPLASTY);  Surgeon: Edie Norleen PARAS, MD;  Location: [facility];  Service: Orthopedics;  Laterality: Right;   KNEE ARTHROSCOPY  1998   right   MELANOMA EXCISION  11/09/10   wide excision right upper arm   SQUAMOUS CELL CARCINOMA EXCISION  02/2016   TEE WITHOUT CARDIOVERSION N/A 06/15/2023   Procedure: TRANSESOPHAGEAL ECHOCARDIOGRAM (TEE);  Surgeon: Perla Evalene PARAS, MD;  Location: [facility];  Service: Cardiovascular;  Laterality: N/A;   TONSILLECTOMY AND ADENOIDECTOMY  1960    Social  History   Socioeconomic History   Marital status: Widowed    Spouse name: Not on file   Number of children: 4   Years of education: Not on file   Highest education level: Not on file  Occupational History   Occupation: retired- [employer],   Tobacco Use   Smoking status: Every Day    Current packs/day: 0.00    Types: Cigarettes    Last attempt to quit: 03/17/2021    Years since quitting: 2.2    Passive exposure: Never   Smokeless tobacco: Never   Tobacco comments:    occassionally  Vaping Use   Vaping status: Never Used  Substance and Sexual Activity   Alcohol use: Yes    Alcohol/week: 7.0 standard drinks of alcohol    Types: 7 Glasses of wine per week    Comment: regular wine with dinner   Drug use: No   Sexual activity: Never  Other Topics Concern   Not on file  Social History Narrative   Widowed 2/11 -2nd for him , 1st for her. 4 step sons. 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         Has living will and health care POA--changing her POA at this point   Requests DNR   Discussed MOST form---but would probably accept IV fluids/antibiotics, etc   Requests no feeding tube         Social Drivers of Health   Financial Resource Strain: Not on file  Food Insecurity: No Food Insecurity (06/23/2023)   Hunger Vital Sign    Worried About Running Out of Food in the Last Year: Never true    Ran Out of Food in the Last Year: Never true  Transportation Needs: No Transportation Needs (06/23/2023)   PRAPARE - Administrator, Civil Service (Medical): No    Lack of Transportation (Non-Medical): No  Physical Activity: Not on file  Stress: Not on file  Social Connections: Moderately Isolated (06/23/2023)   Social Connection and Isolation Panel [NHANES]    Frequency of Communication with Friends and Family: More than three times a week    Frequency of Social Gatherings with Friends and Family: Twice a week    Attends Religious Services: 1 to 4  times per year    Active Member of [employer] or Organizations: No    Attends Banker Meetings: Never    Marital Status: Widowed  Intimate Partner Violence: Not At Risk (06/23/2023)   Humiliation, Afraid, Rape, and Kick questionnaire    Fear of Current or Ex-Partner: No    Emotionally Abused: No    Physically Abused: No    Sexually Abused: No    Family History  Problem Relation Age of Onset   Alzheimer's disease Mother    Pancreatic cancer Father    Parkinson's disease Brother    Coronary artery disease Neg Hx    Diabetes Neg Hx  Cancer Neg Hx        breast or colon   Allergies  Allergen Reactions   Triamterene  Hives   I? Current Facility-Administered Medications  Medication Dose Route Frequency Provider Last Rate Last Admin   0.9 %  sodium chloride  infusion   Intravenous Continuous Lenon Marien CROME, MD   Stopped at 06/23/23 1102   acetaminophen  (TYLENOL ) tablet 650 mg  650 mg Oral Q6H PRN Mansy, Jan A, MD       Or   acetaminophen  (TYLENOL ) suppository 650 mg  650 mg Rectal Q6H PRN Mansy, Jan A, MD       amLODipine  (NORVASC ) tablet 10 mg  10 mg Oral Daily Lenon Marien CROME, MD   10 mg at 06/23/23 1100   Ampicillin -Sulbactam (UNASYN ) 3 g in sodium chloride  0.9 % 100 mL IVPB  3 g Intravenous Q12H Mansy, Jan A, MD   Stopped at 06/23/23 1130   apixaban  (ELIQUIS ) tablet 2.5 mg  2.5 mg Oral BID Mansy, Jan A, MD   2.5 mg at 06/23/23 1100   loratadine  (CLARITIN ) tablet 10 mg  10 mg Oral Daily Mansy, Jan A, MD   10 mg at 06/23/23 1100   multivitamin with minerals tablet   Oral Daily Mansy, Jan A, MD   1 tablet at 06/23/23 1100   ondansetron  (ZOFRAN ) tablet 4 mg  4 mg Oral Q6H PRN Mansy, Jan A, MD       Or   ondansetron  (ZOFRAN ) injection 4 mg  4 mg Intravenous Q6H PRN Mansy, Jan A, MD       traZODone  (DESYREL ) tablet 25 mg  25 mg Oral QHS PRN Mansy, Madison LABOR, MD         Abtx:  Anti-infectives (From admission, onward)    Start     Dose/Rate Route Frequency Ordered Stop    06/23/23 1000  Ampicillin -Sulbactam (UNASYN ) 3 g in sodium chloride  0.9 % 100 mL IVPB        3 g 200 mL/hr over 30 Minutes Intravenous Every 12 hours 06/23/23 0443     06/22/23 2245  Ampicillin -Sulbactam (UNASYN ) 3 g in sodium chloride  0.9 % 100 mL IVPB        3 g 200 mL/hr over 30 Minutes Intravenous  Once 06/22/23 2244 06/22/23 2334       REVIEW OF SYSTEMS:  Const: negative fever, negative chills, negative weight loss Eyes: negative diplopia or visual changes, negative eye pain ENT: negative coryza, negative sore throat Resp: negative cough, hemoptysis, dyspnea Cards: negative for chest pain, palpitations, lower extremity edema GU: negative for frequency, dysuria and hematuria GI: As above Skin: negative for rash and pruritus Heme: negative for easy bruising and gum/nose bleeding MS: negative for myalgias, arthralgias, back pain and muscle weakness Neurolo:negative for headaches, dizziness, vertigo, memory problems  Psych: negative for feelings of anxiety, depression  Endocrine: negative for thyroid , diabetes Allergy/Immunology- negative for any medication or food allergies ? Pertinent Positives include : Objective:  VITALS:  BP (!) 144/85 (BP Location: Right Arm)   Pulse 98   Temp 99.2 F (37.3 C) (Oral)   Resp 16   Ht 5' 4 (1.626 m)   Wt 62.1 kg   LMP  (LMP Unknown)   SpO2 98%   BMI 23.52 kg/m  .  The PHYSICAL EXAM:  General: Alert, cooperative, no distress, appears stated age.  Head: Normocephalic, without obvious abnormality, atraumatic. Eyes: Conjunctivae clear, anicteric sclerae. Pupils are equal ENT Nares normal. No drainage or sinus tenderness. Lips,  mucosa, and tongue normal. No Thrush Neck: Supple, symmetrical, no adenopathy, thyroid : non tender no carotid bruit and no JVD. Back: No CVA tenderness. Lungs: Clear to auscultation bilaterally. No Wheezing or Rhonchi. No rales. Heart: Irregular Abdomen: Soft, non-tender,not distended. Bowel sounds  normal. No masses Extremities: atraumatic, no cyanosis. No edema. No clubbing Skin: No rashes or lesions. Or bruising Lymph: Cervical, supraclavicular normal. Neurologic: Grossly non-focal Pertinent Labs Lab Results CBC    Component Value Date/Time   WBC 16.6 (H) 06/23/2023 0554   RBC 2.15 (L) 06/23/2023 0554   HGB 8.7 (L) 06/23/2023 0554   HGB 11.6 (L) 08/21/2014 1141   HCT 23.8 (L) 06/23/2023 0554   HCT 34.1 (L) 08/21/2014 1141   PLT 113 (L) 06/23/2023 0554   PLT 183 08/21/2014 1141   MCV 110.7 (H) 06/23/2023 0554   MCV 107 (H) 08/21/2014 1141   MCH 40.5 (H) 06/23/2023 0554   MCHC 36.6 (H) 06/23/2023 0554   RDW 14.6 06/23/2023 0554   RDW 15.3 (H) 08/21/2014 1141   LYMPHSABS 22.4 (H) 06/22/2023 1906   LYMPHSABS 10.4 (H) 06/20/2012 1403   MONOABS 0.5 06/22/2023 1906   MONOABS 0.5 06/20/2012 1403   EOSABS 0.1 06/22/2023 1906   EOSABS 0.2 06/20/2012 1403   BASOSABS 0.1 06/22/2023 1906   BASOSABS 0.1 06/20/2012 1403       Latest Ref Rng & Units 06/23/2023    5:54 AM 06/22/2023    7:06 PM 06/16/2023    4:27 AM  CMP  Glucose 70 - 99 mg/dL 96  860  92   BUN 8 - 23 mg/dL 23  27  27    Creatinine 0.44 - 1.00 mg/dL 8.59  8.36  8.57   Sodium 135 - 145 mmol/L 140  141  140   Potassium 3.5 - 5.1 mmol/L 3.7  4.5  3.4   Chloride 98 - 111 mmol/L 111  110  111   CO2 22 - 32 mmol/L 22  22  22    Calcium  8.9 - 10.3 mg/dL 8.9  9.5  9.2   Total Protein 6.5 - 8.1 g/dL  6.0    Total Bilirubin 0.0 - 1.2 mg/dL  0.6    Alkaline Phos 38 - 126 U/L  56    AST 15 - 41 U/L  24    ALT 0 - 44 U/L  17        Microbiology: Recent Results (from the past 240 hours)  Culture, blood (Routine X 2) w Reflex to ID Panel     Status: None   Collection Time: 06/15/23  7:54 PM   Specimen: BLOOD  Result Value Ref Range Status   Specimen Description BLOOD BLOOD RIGHT ARM  Final   Special Requests   Final    BOTTLES DRAWN AEROBIC AND ANAEROBIC Blood Culture results may not be optimal due to an inadequate  volume of blood received in culture bottles   Culture   Final    NO GROWTH 5 DAYS Performed at [facility], [address] [place], [place] [address]    Report Status 06/20/2023 FINAL  Final  Culture, blood (Routine X 2) w Reflex to ID Panel     Status: None   Collection Time: 06/15/23  7:59 PM   Specimen: BLOOD  Result Value Ref Range Status   Specimen Description BLOOD BLOOD RIGHT HAND  Final   Special Requests   Final    BOTTLES DRAWN AEROBIC AND ANAEROBIC Blood Culture results may not be optimal due  to an inadequate volume of blood received in culture bottles   Culture   Final    NO GROWTH 5 DAYS Performed at [facility], [address] [place], [place] [address]    Report Status 06/20/2023 FINAL  Final  Culture, blood (routine x 2)     Status: None (Preliminary result)   Collection Time: 06/22/23 10:42 PM   Specimen: BLOOD  Result Value Ref Range Status   Specimen Description   Final    BLOOD BLOOD LEFT HAND Performed at [facility], [address], [place], [place] [address]    Special Requests   Final    BOTTLES DRAWN AEROBIC AND ANAEROBIC Blood Culture adequate volume Performed at [facility], [address], [place], [place] [address]    Culture  Setup Time   Final    GRAM POSITIVE RODS AEROBIC BOTTLE ONLY CRITICAL RESULT CALLED TO, READ BACK BY AND VERIFIED WITH: TREY, G. [id number] 06/23/2023 LRL GRAM STAIN REVIEWED-AGREE WITH RESULT DRT Performed at [facility], [address], [place], [place] [address]    Culture   Final    NO GROWTH < 12 HOURS Performed at [facility] [place], [address] [place], [place] [address]    Report Status PENDING  Incomplete  Culture, blood (routine x 2)     Status: None (Preliminary result)   Collection Time: 06/22/23 10:42 PM   Specimen: BLOOD  Result Value Ref Range Status   Specimen Description BLOOD BLOOD RIGHT HAND  Final   Special Requests   Final    BOTTLES DRAWN  AEROBIC AND ANAEROBIC Blood Culture adequate volume   Culture   Final    NO GROWTH < 12 HOURS Performed at [facility], [address], [place], [place] [address]    Report Status PENDING  Incomplete    IMAGING RESULTS: CT abdomen pelvis reviewed -Acute Aortic graft and stent present I have personally reviewed the films ? Impression/Recommendation Clostridium ramosum bacteremia just persistent.  She is currently on p.o. Augmentin  She is breaking through the.  May be she is not getting adequate dose Will change that to IV Unasyn  The concern is that there is a deep Sosan not able to identify it TEE was negative CT abdomen and pelvis was negative PET scan was normal Patient have to get a colonoscopy as this organism may be associated with colon cancer.  Discussed with GI  She has a aortic graft stent and there is no evidence of infection there by PET scan we had last admission  Anemia  Mesenteric ischemia   CLL being observed  A-fib ? Discussed the management with the patient and the care team. Note:  This document was prepared using Dragon voice recognition software and may include unintentional dictation errors.

## 2023-06-23 NOTE — Progress Notes (Signed)
 PROGRESS NOTE  Tiffany Martinez    DOB: 10/22/1933, 89 y.o.  FMW:[id number]    Code Status: Limited: Do not attempt resuscitation (DNR) -DNR-LIMITED -Do Not Intubate/DNI    DOA: 06/22/2023   LOS: 0   Brief hospital course  Tiffany Martinez is a 89 y.o. female with medical history significant for anxiety, CHF, CLL, hypertension, dyslipidemia, migraine, PAD, paroxysmal atrial fibrillation, who presented to the emergency room with acute onset of recurrent nausea, vomiting with diarrhea over the last day with associated abdominal cramps. was recently admitted for Clostridium bacteremia and continues antibiotic therapy with p.o. Augmentin . Discharged 1/30.  ED Course: When she came to the ER, BP was 139/105 with otherwise normal vital signs.  Labs revealed BUN of 27 creatinine 1.63 with otherwise unremarkable CMP.  Lactic acid was 1.2 and later 1.1.  CBC showed leukocytosis 29.3 with lymphocytosis and mild thrombocytopenia 146, anemia with hemoglobin 10.3 hematocrit 28.8 close to previous levels. EKG as reviewed by me : None Imaging: CT of the abdomen and pelvis with contrast revealed the following: 1. No acute process demonstrated in the abdomen or pelvis. No evidence of bowel obstruction or inflammation. 2. Chronic atrophy of the right kidney. 3. Layering stones or sludge in the gallbladder without evidence of acute cholecystitis. 4. Aortic atherosclerosis. Postoperative changes of aortoiliac stent graft.   Right upper quadrant ultrasound revealed cholelithiasis without cholecystitis.   The patient was given 1 L bolus of IV lactated Ringer , 4 mg of IV Zofran  and 3 g of IV Unasyn .   06/23/23 -patient had resolution of her symptoms so was planning to dc home but then a blood culture on admission returned positive for suspected same organism as previous admission  Assessment & Plan  Principal Problem:   Acute gastroenteritis Active Problems:   Cholelithiasis   CLL (chronic lymphocytic  leukemia) (HCC)   Paroxysmal atrial fibrillation (HCC)   Essential hypertension  Acute gastroenteritis Clostridium ramosum bacteremia- 1/4 cultures positive for GPR again so likely not effective treatment with PO.  - ID following - continue unasyn  - GI consulted for EGD/colonoscopy- evaluate for source of infection - serial blood cultures to verify clearing infection.  - supportive care PRN   Cholelithiasis- not symptomatic. No signs of inflammation, obstruction   Essential hypertension - continue antihypertensive therapy.   Paroxysmal atrial fibrillation (HCC) - continue Eliquis  and Toprol -XL.   CLL (chronic lymphocytic leukemia) (HCC) - monitor CBC with differential.  Body mass index is 23.52 kg/m.  VTE ppx: apixaban  (ELIQUIS ) tablet 2.5 mg Start: 06/23/23 1000 apixaban  (ELIQUIS ) tablet 2.5 mg   Diet:     Diet   Diet clear liquid Room service appropriate? Yes; Fluid consistency: Thin   Consultants: ID GI  Subjective 06/23/23    Pt reports feeling overall weak. Able to tolerate normal diet. No more diarrhea since admission. Denies nausea or vomiting since admission.    Objective   Vitals:   06/23/23 0230 06/23/23 0430 06/23/23 0515 06/23/23 0645  BP: (!) 155/52 (!) 154/45 (!) 171/60 (!) 156/44  Pulse: (!) 59 (!) 56 (!) 58 (!) 55  Resp: 16 15 17 16   Temp:    98.5 F (36.9 C)  TempSrc:    Oral  SpO2: 99% 94% 96% 96%  Weight:      Height:       No intake or output data in the 24 hours ending 06/23/23 0755 Filed Weights   06/22/23 1904  Weight: 62.1 kg     Physical Exam:  General: awake, alert, NAD HEENT: atraumatic, clear conjunctiva, anicteric sclera, MMM, hearing grossly normal Respiratory: normal respiratory effort. Cardiovascular: quick capillary refill, normal S1/S2, RRR, no JVD, murmurs Gastrointestinal: soft, NT, ND Nervous: A&O x3. no gross focal neurologic deficits, normal speech Extremities: moves all equally, no edema, normal tone Skin:  dry, intact, normal temperature, normal color. No rashes, lesions or ulcers on exposed skin Psychiatry: normal mood, congruent affect  Labs   I have personally reviewed the following labs and imaging studies CBC    Component Value Date/Time   WBC 16.6 (H) 06/23/2023 0554   RBC 2.15 (L) 06/23/2023 0554   HGB 8.7 (L) 06/23/2023 0554   HGB 11.6 (L) 08/21/2014 1141   HCT 23.8 (L) 06/23/2023 0554   HCT 34.1 (L) 08/21/2014 1141   PLT 113 (L) 06/23/2023 0554   PLT 183 08/21/2014 1141   MCV 110.7 (H) 06/23/2023 0554   MCV 107 (H) 08/21/2014 1141   MCH 40.5 (H) 06/23/2023 0554   MCHC 36.6 (H) 06/23/2023 0554   RDW 14.6 06/23/2023 0554   RDW 15.3 (H) 08/21/2014 1141   LYMPHSABS 22.4 (H) 06/22/2023 1906   LYMPHSABS 10.4 (H) 06/20/2012 1403   MONOABS 0.5 06/22/2023 1906   MONOABS 0.5 06/20/2012 1403   EOSABS 0.1 06/22/2023 1906   EOSABS 0.2 06/20/2012 1403   BASOSABS 0.1 06/22/2023 1906   BASOSABS 0.1 06/20/2012 1403      Latest Ref Rng & Units 06/23/2023    5:54 AM 06/22/2023    7:06 PM 06/16/2023    4:27 AM  BMP  Glucose 70 - 99 mg/dL 96  860  92   BUN 8 - 23 mg/dL 23  27  27    Creatinine 0.44 - 1.00 mg/dL 8.59  8.36  8.57   Sodium 135 - 145 mmol/L 140  141  140   Potassium 3.5 - 5.1 mmol/L 3.7  4.5  3.4   Chloride 98 - 111 mmol/L 111  110  111   CO2 22 - 32 mmol/L 22  22  22    Calcium  8.9 - 10.3 mg/dL 8.9  9.5  9.2     US  ABDOMEN LIMITED RUQ (LIVER/GB) Result Date: 06/22/2023 CLINICAL DATA:  Cholecystitis.  Abdominal pain EXAM: ULTRASOUND ABDOMEN LIMITED RIGHT UPPER QUADRANT COMPARISON:  CT 06/22/2023 FINDINGS: Gallbladder: Multiple small stones layering in the gallbladder. No gallbladder wall thickening or edema. Murphy's sign is negative. Common bile duct: Diameter: 4 mm, normal Liver: No focal lesion identified. Within normal limits in parenchymal echogenicity. Portal vein is patent on color Doppler imaging with normal direction of blood flow towards the liver. Other: Right renal  atrophy. Increased echotexture consistent with medical renal disease. IMPRESSION: Cholelithiasis. No additional changes to suggest acute cholecystitis. Electronically Signed   By: Tiffany Martinez M.D.   On: 06/22/2023 22:30   CT ABDOMEN PELVIS W CONTRAST Result Date: 06/22/2023 CLINICAL DATA:  Acute nonlocalized abdominal pain. Nausea, vomiting, and diarrhea. Dark stools. EXAM: CT ABDOMEN AND PELVIS WITH CONTRAST TECHNIQUE: Multidetector CT imaging of the abdomen and pelvis was performed using the standard protocol following bolus administration of intravenous contrast. RADIATION DOSE REDUCTION: This exam was performed according to the departmental dose-optimization program which includes automated exposure control, adjustment of the mA and/or kV according to patient size and/or use of iterative reconstruction technique. CONTRAST:  75mL OMNIPAQUE  IOHEXOL  300 MG/ML  SOLN COMPARISON:  PET-CT 06/15/2023. CT chest abdomen and pelvis 06/13/2023. FINDINGS: Lower chest: Linear scarring in the lung bases. Hepatobiliary: Layering stones or sludge  in the gallbladder. No gallbladder wall thickening. Bile ducts are not dilated. No focal liver lesions. Pancreas: Unremarkable. No pancreatic ductal dilatation or surrounding inflammatory changes. Spleen: Normal in size without focal abnormality. Adrenals/Urinary Tract: No adrenal gland nodules. Prominent right renal atrophy. No hydronephrosis or hydroureter. Bladder is normal. Bilateral renal cysts. Largest on the left measures 1.6 cm diameter. No imaging follow-up is indicated. Stomach/Bowel: Stomach, small bowel, and colon are not abnormally distended. Stool fills the colon. Colonic diverticulosis without evidence of acute diverticulitis. No wall thickening or inflammatory changes. Appendix is normal. Vascular/Lymphatic: Aortic calcification. 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 Stent graft is patent. No significant lymphadenopathy. Reproductive: Large  calcification posterior to the uterus likely representing calcified fibroid. No abnormal adnexal masses. Other: No free air or free fluid. Abdominal wall musculature appears intact. Musculoskeletal: Postoperative right hip hemiarthroplasty. Mild degenerative changes in the spine. IMPRESSION: 1. No acute process demonstrated in the abdomen or pelvis. No evidence of bowel obstruction or inflammation. 2. Chronic atrophy of the right kidney. 3. Layering stones or sludge in the gallbladder without evidence of acute cholecystitis. 4. Aortic atherosclerosis. Postoperative changes of aortoiliac stent graft. Electronically Signed   By: Tiffany Martinez M.D.   On: 06/22/2023 20:12    Disposition Plan & Communication  Patient status: Observation  Admitted From: ALF Planned disposition location: Skilled nursing facility Anticipated discharge date: TBD pending completed workup including GI/ID clearance and clearing BxCx  Family Communication: none     Author: Marien LITTIE Piety, DO [facility] 06/23/2023, 7:55 AM   Available by Epic secure chat 7AM-7PM. If 7PM-7AM, please contact night-coverage.  [facility] contact information found on [email or web address].

## 2023-06-23 NOTE — Assessment & Plan Note (Signed)
-   She will need surgical follow-up upon discharge.

## 2023-06-23 NOTE — Assessment & Plan Note (Signed)
 -  We will continue Eliquis and Toprol-XL.

## 2023-06-23 NOTE — Assessment & Plan Note (Signed)
-   Will monitor CBC with differential.

## 2023-06-23 NOTE — Progress Notes (Signed)
 PHARMACY - PHYSICIAN COMMUNICATION CRITICAL VALUE ALERT - BLOOD CULTURE IDENTIFICATION (BCID)  Tiffany Martinez is an 89 y.o. female who presented to [facility] on 06/22/2023 with a chief complaint of abdominal pain, N/V/D w/ dark stools.  She is being treating for clostridium ramosum bacteremia.    Assessment:  Blood culture from 2/5 with GPR in 1 of 4 bottles currently.  This is significant with recent admission for C. Ramosum bacteremia (taking augmentin )  Name of physician (or Provider) Contacted: Dr Lenon  Current antibiotics: ampicillin gabino  Changes to prescribed antibiotics recommended:  Patient is on recommended antibiotics - No changes needed Suggest ID evaluation for concern relapsing bacteremia with C. ramosum  No results found for this or any previous visit.  [name], PharmD, BCPS, BCIDP Work Cell: [phone] 06/23/2023 11:34 AM

## 2023-06-24 DIAGNOSIS — C9110 Chronic lymphocytic leukemia of B-cell type not having achieved remission: Secondary | ICD-10-CM | POA: Diagnosis not present

## 2023-06-24 DIAGNOSIS — R7881 Bacteremia: Secondary | ICD-10-CM | POA: Diagnosis not present

## 2023-06-24 DIAGNOSIS — K529 Noninfective gastroenteritis and colitis, unspecified: Secondary | ICD-10-CM | POA: Diagnosis not present

## 2023-06-24 LAB — CULTURE, BLOOD (ROUTINE X 2): Special Requests: ADEQUATE

## 2023-06-24 MED ORDER — PEG 3350-KCL-NA BICARB-NACL 420 G PO SOLR
4000.0000 mL | Freq: Once | ORAL | Status: AC
  Administered 2023-06-24: 4000 mL via ORAL
  Filled 2023-06-24: qty 4000

## 2023-06-24 MED ORDER — ORAL CARE MOUTH RINSE: 15.0000 mL | OROMUCOSAL | Status: DC | PRN

## 2023-06-24 MED ORDER — METOPROLOL SUCCINATE ER 25 MG PO TB24
12.5000 mg | ORAL_TABLET | Freq: Every day | ORAL | Status: DC
  Administered 2023-06-24 – 2023-06-26 (×3): 12.5 mg via ORAL
  Filled 2023-06-24 (×3): qty 1

## 2023-06-24 MED ORDER — PEG 3350-KCL-NA BICARB-NACL 420 G PO SOLR
2000.0000 mL | Freq: Once | ORAL | Status: DC | PRN
  Filled 2023-06-24: qty 4000

## 2023-06-24 MED ORDER — LOSARTAN POTASSIUM 50 MG PO TABS
100.0000 mg | ORAL_TABLET | Freq: Every day | ORAL | Status: DC
  Administered 2023-06-24 – 2023-06-26 (×3): 100 mg via ORAL
  Filled 2023-06-24 (×4): qty 2

## 2023-06-24 MED ORDER — BISACODYL 5 MG PO TBEC
10.0000 mg | DELAYED_RELEASE_TABLET | Freq: Once | ORAL | Status: AC
  Administered 2023-06-24: 10 mg via ORAL
  Filled 2023-06-24: qty 2

## 2023-06-24 MED ORDER — SODIUM CHLORIDE 0.9 % IV SOLN: INTRAVENOUS | Status: DC

## 2023-06-24 NOTE — Progress Notes (Signed)
 Date of Admission:  06/22/2023     ID: Tiffany Martinez is a 89 y.o. female  Principal Problem:   Acute gastroenteritis Active Problems:   Chronic diastolic heart failure (HCC)   CLL (chronic lymphocytic leukemia) (HCC)   Paroxysmal atrial fibrillation (HCC)   Bacteremia   CKD stage 3b, GFR 30-44 ml/min (HCC)   Essential hypertension   Cholelithiasis    Subjective: Pt is doing okay Has   Medications:   amLODipine   10 mg Oral Daily   loratadine   10 mg Oral Daily   multivitamin with minerals   Oral Daily    Objective: Vital signs in last 24 hours: Patient Vitals for the past 24 hrs:  BP Temp Temp src Pulse Resp SpO2  06/24/23 1000 (!) 199/59 98.1 F (36.7 C) Oral (!) 59 -- 97 %  06/24/23 0356 (!) 175/53 97.9 F (36.6 C) Oral (!) 55 16 94 %  06/23/23 2032 (!) 172/55 98.8 F (37.1 C) Oral (!) 59 16 96 %  06/23/23 1620 (!) 144/85 99.2 F (37.3 C) Oral 98 16 98 %  06/23/23 1522 (!) 159/81 98.9 F (37.2 C) Oral 65 18 96 %  06/23/23 1410 (!) 174/56 98.8 F (37.1 C) Oral 60 16 95 %  06/23/23 1330 (!) 182/56 -- -- -- 17 --  06/23/23 1245 (!) 167/67 -- -- -- (!) 23 --  06/23/23 1200 (!) 177/61 -- -- 63 17 97 %  06/23/23 1115 (!) 182/127 -- -- 63 18 99 %     LDA Foley Central lines Other catheters  PHYSICAL EXAM:  General: Alert, cooperative, no distress, appears stated age.  Head: Normocephalic, without obvious abnormality, atraumatic. Eyes: Conjunctivae clear, anicteric sclerae. Pupils are equal ENT Nares normal. No drainage or sinus tenderness. Lips, mucosa, and tongue normal. No Thrush Neck: Supple, symmetrical, no adenopathy, thyroid : non tender no carotid bruit and no JVD. Back: No CVA tenderness. Lungs: Clear to auscultation bilaterally. No Wheezing or Rhonchi. No rales. Heart: Regular rate and rhythm, no murmur, rub or gallop. Abdomen: Soft, non-tender,not distended. Bowel sounds normal. No masses Extremities: atraumatic, no cyanosis. No edema. No  clubbing Skin: No rashes or lesions. Or bruising Lymph: Cervical, supraclavicular normal. Neurologic: Grossly non-focal  Lab Results    Latest Ref Rng & Units 06/23/2023    5:54 AM 06/22/2023    7:06 PM 06/16/2023    4:27 AM  CBC  WBC 4.0 - 10.5 K/uL 16.6  29.3  15.7   Hemoglobin 12.0 - 15.0 g/dL 8.7  89.6  9.1   Hematocrit 36.0 - 46.0 % 23.8  28.8  26.7   Platelets 150 - 400 K/uL 113  146  86        Latest Ref Rng & Units 06/23/2023    5:54 AM 06/22/2023    7:06 PM 06/16/2023    4:27 AM  CMP  Glucose 70 - 99 mg/dL 96  860  92   BUN 8 - 23 mg/dL 23  27  27    Creatinine 0.44 - 1.00 mg/dL 8.59  8.36  8.57   Sodium 135 - 145 mmol/L 140  141  140   Potassium 3.5 - 5.1 mmol/L 3.7  4.5  3.4   Chloride 98 - 111 mmol/L 111  110  111   CO2 22 - 32 mmol/L 22  22  22    Calcium  8.9 - 10.3 mg/dL 8.9  9.5  9.2   Total Protein 6.5 - 8.1 g/dL  6.0  Total Bilirubin 0.0 - 1.2 mg/dL  0.6    Alkaline Phos 38 - 126 U/L  56    AST 15 - 41 U/L  24    ALT 0 - 44 U/L  17        Microbiology:  Studies/Results: US  ABDOMEN LIMITED RUQ (LIVER/GB) Result Date: 06/22/2023 CLINICAL DATA:  Cholecystitis.  Abdominal pain EXAM: ULTRASOUND ABDOMEN LIMITED RIGHT UPPER QUADRANT COMPARISON:  CT 06/22/2023 FINDINGS: Gallbladder: Multiple small stones layering in the gallbladder. No gallbladder wall thickening or edema. Murphy's sign is negative. Common bile duct: Diameter: 4 mm, normal Liver: No focal lesion identified. Within normal limits in parenchymal echogenicity. Portal vein is patent on color Doppler imaging with normal direction of blood flow towards the liver. Other: Right renal atrophy. Increased echotexture consistent with medical renal disease. IMPRESSION: Cholelithiasis. No additional changes to suggest acute cholecystitis. Electronically Signed   By: Elsie Gravely M.D.   On: 06/22/2023 22:30   CT ABDOMEN PELVIS W CONTRAST Result Date: 06/22/2023 CLINICAL DATA:  Acute nonlocalized abdominal pain. Nausea,  vomiting, and diarrhea. Dark stools. EXAM: CT ABDOMEN AND PELVIS WITH CONTRAST TECHNIQUE: Multidetector CT imaging of the abdomen and pelvis was performed using the standard protocol following bolus administration of intravenous contrast. RADIATION DOSE REDUCTION: This exam was performed according to the departmental dose-optimization program which includes automated exposure control, adjustment of the mA and/or kV according to patient size and/or use of iterative reconstruction technique. CONTRAST:  75mL OMNIPAQUE  IOHEXOL  300 MG/ML  SOLN COMPARISON:  PET-CT 06/15/2023. CT chest abdomen and pelvis 06/13/2023. FINDINGS: Lower chest: Linear scarring in the lung bases. Hepatobiliary: Layering stones or sludge in the gallbladder. No gallbladder wall thickening. Bile ducts are not dilated. No focal liver lesions. Pancreas: Unremarkable. No pancreatic ductal dilatation or surrounding inflammatory changes. Spleen: Normal in size without focal abnormality. Adrenals/Urinary Tract: No adrenal gland nodules. Prominent right renal atrophy. No hydronephrosis or hydroureter. Bladder is normal. Bilateral renal cysts. Largest on the left measures 1.6 cm diameter. No imaging follow-up is indicated. Stomach/Bowel: Stomach, small bowel, and colon are not abnormally distended. Stool fills the colon. Colonic diverticulosis without evidence of acute diverticulitis. No wall thickening or inflammatory changes. Appendix is normal. Vascular/Lymphatic: Aortic calcification. 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 Stent graft is patent. No significant lymphadenopathy. Reproductive: Large calcification posterior to the uterus likely representing calcified fibroid. No abnormal adnexal masses. Other: No free air or free fluid. Abdominal wall musculature appears intact. Musculoskeletal: Postoperative right hip hemiarthroplasty. Mild degenerative changes in the spine. IMPRESSION: 1. No acute process demonstrated in the abdomen  or pelvis. No evidence of bowel obstruction or inflammation. 2. Chronic atrophy of the right kidney. 3. Layering stones or sludge in the gallbladder without evidence of acute cholecystitis. 4. Aortic atherosclerosis. Postoperative changes of aortoiliac stent graft. Electronically Signed   By: Elsie Gravely M.D.   On: 06/22/2023 20:12     Assessment/Plan: Clostridium ramosum bacteremia just persistent.  She is currently on p.o. Augmentin  The bacteria in the blood from this admission is bacillus a skin contaminant and no clostridium So the augmentin  she was taking was working ( so on discharge we could do augmentin  for 2 weeks ) TEE was negative CT abdomen and pelvis was negative She has a aortic graft stent and there is no evidence of infection there by PET scan  last admission was normal    Because of diarrhea and possible GI bleed due to the color of stool ( not seen)  and alternating constipation/diarrhea need to r/o GI source  - will need colonoscopy/upper GI- discussed with Dr.Russo and he will see her     Anemia   H/o Mesenteric ischemia    CLL being observed   A-fib  Discussed the management with the patient in detail and with care team

## 2023-06-24 NOTE — Progress Notes (Signed)
 Please see previous consult note from last admission..   walked into this patient's room today and the nurse was there.  The nurse very pleasantly asked me if we were planning to do an EGD and colonoscopy on this patient.  I informed the nurse that that was what the patient I had discussed in the past and we thought it was a good idea at that time and again now.  I then turned to the patient and said hello and she started screaming and yelling that I was talking to somebody besides her in the room about her.  I profusely apologized multiple times she told me to shut up multiple times.  I paused and waited for her to calm down.  I then asked her if she would like to continue with our conversation and she said no and to get out.  I have informed the inpatient team and and if the patient agrees then will try and have the GI on call see the patient during the weekend and  plan for the procedures.

## 2023-06-24 NOTE — Progress Notes (Signed)
  Progress Note   Patient: Tiffany Martinez FMW:[id number] DOB: December 04, 1933 DOA: 06/22/2023     1 DOS: the patient was seen and examined on 06/24/2023   Brief hospital course: Tiffany Martinez is a 89 y.o. female with medical history significant for anxiety, CHF, CLL, hypertension, dyslipidemia, migraine, PAD, paroxysmal atrial fibrillation, who presented to the emergency room with acute onset of recurrent nausea, vomiting with diarrhea over the last day with associated abdominal cramps. was recently admitted for Clostridium bacteremia and continues antibiotic therapy with p.o. Augmentin . Discharged 1/30.  Repeat blood culture positive in all bottles.  Seen by ID, Unasyn  started.  Also recommended EGD and colonoscopy.   Principal Problem:   Acute gastroenteritis Active Problems:   Cholelithiasis   Chronic diastolic heart failure (HCC)   CLL (chronic lymphocytic leukemia) (HCC)   Paroxysmal atrial fibrillation (HCC)   Bacteremia   CKD stage 3b, GFR 30-44 ml/min (HCC)   Essential hypertension   Assessment and Plan: Acute gastroenteritis Clostridium ramosum bacteremia-  Patient has been evaluated by ID, need to rule out colon cancer with EGD/colonoscopy.  Seen by GI, pending decision about procedure as patient has not been agreeable. Continue Unasyn .  Chronic kidney disease stage IIIb. Renal function still stable.    Cholelithiasis- not symptomatic. No signs of inflammation, obstruction   Essential hypertension Blood pressure still running high, restart losartan  in addition to beta-blocker and amlodipine .   Paroxysmal atrial fibrillation (HCC) Continue beta-blocker, discontinue Eliquis  pending GI workup.   CLL (chronic lymphocytic leukemia) (HCC) - monitor CBC with differential.        Subjective:  Patient feels well today, she had some intermittent loose stools.  Physical Exam: Vitals:   06/23/23 2032 06/24/23 0356 06/24/23 1000 06/24/23 1205  BP: (!) 172/55 (!)  175/53 (!) 199/59 (!) 184/47  Pulse: (!) 59 (!) 55 (!) 59 (!) 56  Resp: 16 16    Temp: 98.8 F (37.1 C) 97.9 F (36.6 C) 98.1 F (36.7 C)   TempSrc: Oral Oral Oral   SpO2: 96% 94% 97%   Weight:      Height:       General exam: Appears calm and comfortable  Respiratory system: Clear to auscultation. Respiratory effort normal. Cardiovascular system: S1 & S2 heard, RRR. No JVD, murmurs, rubs, gallops or clicks. No pedal edema. Gastrointestinal system: Abdomen is nondistended, soft and nontender. No organomegaly or masses felt. Normal bowel sounds heard. Central nervous system: Alert and oriented x3. No focal neurological deficits. Extremities: Symmetric 5 x 5 power. Skin: No rashes, lesions or ulcers Psychiatry: Mood & affect appropriate.    Data Reviewed:  Lab results reviewed.  Family Communication: None listed  Disposition: Status is: Inpatient Remains inpatient appropriate because: Severity of disease, IV treatment, inpatient procedure.     Time spent: 35 minutes  Author: Murvin Mana, MD 06/24/2023 12:19 PM  For on call review [email or web address].

## 2023-06-24 NOTE — Consult Note (Signed)
 Inpatient Consultation   Patient ID: Tiffany Martinez is a 89 y.o. female.  Requesting Provider: Dr. Fayette  Date of Admission: 06/22/2023  Date of Consult: 06/24/23   Reason for Consultation: Clostridium bacteremia   Patient's Chief Complaint:   Chief Complaint  Patient presents with   Abdominal Pain    HPI 89 year old Caucasian female with CLL, hypertension, A-fib on chronic anticoagulation (Eliquis ), chronic mesenteric ischemia, CKD, AAA status post endograft, anxiety who presented for recent hospitalization (1/24 to 06/16/2023) after syncopal episode.  She was found to have Clostridium bacteremia at that time.  She was recommended to undergo bidirectional endoscopy at that time, but decided against it.  She presents to the hospital this time with some rest and dark brown-colored stool.  GI is consulted for bidirectional endoscopy and possible GI bleeding.  Clostridium bacteremia source was unclear at that time.  She did undergo a CT and PET scan both of which were normal.  Angio did not display any signs of fistula and a patent graft without issue.  She also underwent a TEE which did not demonstrate endocarditis. Upon this hospitalization her white count was elevated at 29,000, but she was afebrile.  Hemoglobin went from 10.3-8.7, but notably had a global drop in all of her cell lines.  BUN slightly elevated but ratio within normal limits.  MCV is grossly macrocytic and smudge cells are present.  She has not been able to turn in a stool sample.  The CT did demonstrate diverticulosis as well as increase stool burden.  She denies diarrhea.  No current abdominal pain.  She occasionally has early satiety, but no sitophobia. No melena/nausea/vomiting.  On eliquis - last dose prior to hospitalization- potentially 06/22/23 in evening Denies NSAIDs, Anti-plt agents Father- pancreatic ca Denies other family history of gastrointestinal disease and malignancy Previous Endoscopies: 2003  colonoscopy- adenomatous polyps per pt report  Past Medical History:  Diagnosis Date   AAA (abdominal aortic aneurysm) (HCC) 2012   3.9cm 2012; 2.8cm 12/2021   Anemia    Anxiety    CHF (congestive heart failure) (HCC)    LVEF 60-65% in 2023   Chronic venous insufficiency    CLL (chronic lymphocytic leukemia) (HCC) 2014   Stage 0--by flow cytometry   Hx of colonoscopy    Hyperlipidemia    Hypertension    Melanoma (HCC)    Right Arm   Migraine headache    3-4X/yr   PAD (peripheral artery disease) (HCC) 2012   severe R common iliac stenosis, ABI 0.73, mid L common iliac stenosis ABI 1.1   PAD (peripheral artery disease) (HCC)    Paroxysmal atrial fibrillation (HCC) 12/2021   Wears dentures    full upper and lower    Past Surgical History:  Procedure Laterality Date   ABDOMINAL AORTIC ANEURYSM REPAIR  02/16/12   [facility]--Dr Missy   CARDIOVASCULAR STRESS TEST  2008   negative   CATARACT EXTRACTION W/PHACO Left 07/16/2019   Procedure: CATARACT EXTRACTION PHACO AND INTRAOCULAR LENS PLACEMENT (IOC) LEFT 8.26  00:46.6;  Surgeon: Myrna Adine Anes, MD;  Location: [facility];  Service: Ophthalmology;  Laterality: Left;   DOBUTAMINE  STRESS ECHO  11/11   negative   HIP ARTHROPLASTY Right 10/10/2019   Procedure: ARTHROPLASTY BIPOLAR HIP (HEMIARTHROPLASTY);  Surgeon: Edie Norleen PARAS, MD;  Location: [facility];  Service: Orthopedics;  Laterality: Right;   KNEE ARTHROSCOPY  1998   right   MELANOMA EXCISION  11/09/10   wide excision right upper arm  SQUAMOUS CELL CARCINOMA EXCISION  02/2016   TEE WITHOUT CARDIOVERSION N/A 06/15/2023   Procedure: TRANSESOPHAGEAL ECHOCARDIOGRAM (TEE);  Surgeon: Perla Evalene PARAS, MD;  Location: [facility];  Service: Cardiovascular;  Laterality: N/A;   TONSILLECTOMY AND ADENOIDECTOMY  1960    Allergies  Allergen Reactions   Triamterene  Hives    Family History  Problem Relation Age of Onset   Alzheimer's disease Mother    Pancreatic cancer Father     Parkinson's disease Brother    Coronary artery disease Neg Hx    Diabetes Neg Hx    Cancer Neg Hx        breast or colon    Social History   Tobacco Use   Smoking status: Every Day    Current packs/day: 0.00    Types: Cigarettes    Last attempt to quit: 03/17/2021    Years since quitting: 2.2    Passive exposure: Never   Smokeless tobacco: Never   Tobacco comments:    occassionally  Vaping Use   Vaping status: Never Used  Substance Use Topics   Alcohol use: Yes    Alcohol/week: 7.0 standard drinks of alcohol    Types: 7 Glasses of wine per week    Comment: regular wine with dinner   Drug use: No     Pertinent GI related history and allergies were reviewed with the patient  Review of Systems  Constitutional:  Negative for activity change, appetite change, chills, diaphoresis, fatigue, fever and unexpected weight change.  HENT:  Negative for trouble swallowing and voice change.   Respiratory:  Negative for shortness of breath and wheezing.   Cardiovascular:  Negative for chest pain, palpitations and leg swelling.  Gastrointestinal:  Positive for constipation. Negative for abdominal distention, abdominal pain, anal bleeding, blood in stool, diarrhea, nausea, rectal pain and vomiting.  Musculoskeletal:  Negative for arthralgias and myalgias.  Skin:  Negative for color change and pallor.  Neurological:  Negative for dizziness, syncope and weakness.  Psychiatric/Behavioral:  Negative for confusion.   All other systems reviewed and are negative.    Medications Home Medications No current facility-administered medications on file prior to encounter.   Current Outpatient Medications on File Prior to Encounter  Medication Sig Dispense Refill   amLODipine  (NORVASC ) 5 MG tablet Take 1 tablet (5 mg total) by mouth daily. 90 tablet 3   amoxicillin -clavulanate (AUGMENTIN ) 500-125 MG tablet Take 1 tablet by mouth 2 (two) times daily for 21 doses.     apixaban  (ELIQUIS ) 2.5 MG TABS  tablet TAKE 1 TABLET BY MOUTH TWICE DAILY 180 tablet 1   loratadine  (CLARITIN ) 10 MG tablet Take 10 mg by mouth daily.     losartan  (COZAAR ) 100 MG tablet Take 1 tablet (100 mg total) by mouth daily. 90 tablet 3   Multiple Vitamin (MULTIVITAMIN ADULT PO) Take 1 tablet by mouth daily.     Pertinent GI related medications were reviewed with the patient  Inpatient Medications  Current Facility-Administered Medications:    acetaminophen  (TYLENOL ) tablet 650 mg, 650 mg, Oral, Q6H PRN **OR** acetaminophen  (TYLENOL ) suppository 650 mg, 650 mg, Rectal, Q6H PRN, Mansy, Jan A, MD   amLODipine  (NORVASC ) tablet 10 mg, 10 mg, Oral, Daily, Lenon Pons L, MD, 10 mg at 06/24/23 1028   Ampicillin -Sulbactam (UNASYN ) 3 g in sodium chloride  0.9 % 100 mL IVPB, 3 g, Intravenous, Q12H, Mansy, Jan A, MD, Last Rate: 200 mL/hr at 06/24/23 1036, 3 g at 06/24/23 1036   loratadine  (CLARITIN ) tablet 10 mg,  10 mg, Oral, Daily, Mansy, Jan A, MD, 10 mg at 06/24/23 1028   losartan  (COZAAR ) tablet 100 mg, 100 mg, Oral, Daily, Zhang, Dekui, MD, 100 mg at 06/24/23 1227   multivitamin with minerals tablet, , Oral, Daily, Mansy, Jan A, MD, 1 tablet at 06/24/23 1028   ondansetron  (ZOFRAN ) tablet 4 mg, 4 mg, Oral, Q6H PRN **OR** ondansetron  (ZOFRAN ) injection 4 mg, 4 mg, Intravenous, Q6H PRN, Mansy, Madison LABOR, MD   Oral care mouth rinse, 15 mL, Mouth Rinse, PRN, Lenon Marien CROME, MD   traZODone  (DESYREL ) tablet 25 mg, 25 mg, Oral, QHS PRN, Mansy, Jan A, MD  ampicillin -sulbactam (UNASYN ) IV 3 g (06/24/23 1036)    acetaminophen  **OR** acetaminophen , ondansetron  **OR** ondansetron  (ZOFRAN ) IV, mouth rinse, traZODone    Objective   Vitals:   06/23/23 2032 06/24/23 0356 06/24/23 1000 06/24/23 1205  BP: (!) 172/55 (!) 175/53 (!) 199/59 (!) 184/47  Pulse: (!) 59 (!) 55 (!) 59 (!) 56  Resp: 16 16    Temp: 98.8 F (37.1 C) 97.9 F (36.6 C) 98.1 F (36.7 C)   TempSrc: Oral Oral Oral   SpO2: 96% 94% 97%   Weight:      Height:          Physical Exam Vitals and nursing note reviewed.  Constitutional:      General: She is not in acute distress.    Appearance: Normal appearance. She is not ill-appearing, toxic-appearing or diaphoretic.  HENT:     Head: Normocephalic and atraumatic.     Nose: Nose normal.     Mouth/Throat:     Mouth: Mucous membranes are moist.     Pharynx: Oropharynx is clear.  Eyes:     General: No scleral icterus.    Extraocular Movements: Extraocular movements intact.  Cardiovascular:     Rate and Rhythm: Regular rhythm. Bradycardia present.     Heart sounds: Normal heart sounds. No murmur heard.    No friction rub. No gallop.  Pulmonary:     Effort: Pulmonary effort is normal. No respiratory distress.     Breath sounds: Normal breath sounds. No wheezing, rhonchi or rales.  Abdominal:     General: Bowel sounds are normal. There is no distension.     Palpations: Abdomen is soft.     Tenderness: There is no abdominal tenderness. There is no guarding or rebound.  Musculoskeletal:     Cervical back: Neck supple.     Right lower leg: No edema.     Left lower leg: No edema.  Skin:    General: Skin is warm and dry.     Coloration: Skin is not jaundiced or pale.  Neurological:     General: No focal deficit present.     Mental Status: She is alert and oriented to person, place, and time. Mental status is at baseline.  Psychiatric:        Mood and Affect: Mood normal.        Behavior: Behavior normal.        Thought Content: Thought content normal.        Judgment: Judgment normal.     Laboratory Data Recent Labs  Lab 06/22/23 1906 06/23/23 0554  WBC 29.3* 16.6*  HGB 10.3* 8.7*  HCT 28.8* 23.8*  PLT 146* 113*   Recent Labs  Lab 06/22/23 1906 06/23/23 0554  NA 141 140  K 4.5 3.7  CL 110 111  CO2 22 22  BUN 27* 23  CALCIUM  9.5 8.9  PROT 6.0*  --   BILITOT 0.6  --   ALKPHOS 56  --   ALT 17  --   AST 24  --   GLUCOSE 139* 96   No results for input(s): INR in the  last 168 hours.  Recent Labs    06/22/23 1906  LIPASE 43        Imaging Studies: US  ABDOMEN LIMITED RUQ (LIVER/GB) Result Date: 06/22/2023 CLINICAL DATA:  Cholecystitis.  Abdominal pain EXAM: ULTRASOUND ABDOMEN LIMITED RIGHT UPPER QUADRANT COMPARISON:  CT 06/22/2023 FINDINGS: Gallbladder: Multiple small stones layering in the gallbladder. No gallbladder wall thickening or edema. Murphy's sign is negative. Common bile duct: Diameter: 4 mm, normal Liver: No focal lesion identified. Within normal limits in parenchymal echogenicity. Portal vein is patent on color Doppler imaging with normal direction of blood flow towards the liver. Other: Right renal atrophy. Increased echotexture consistent with medical renal disease. IMPRESSION: Cholelithiasis. No additional changes to suggest acute cholecystitis. Electronically Signed   By: Elsie Gravely M.D.   On: 06/22/2023 22:30   CT ABDOMEN PELVIS W CONTRAST Result Date: 06/22/2023 CLINICAL DATA:  Acute nonlocalized abdominal pain. Nausea, vomiting, and diarrhea. Dark stools. EXAM: CT ABDOMEN AND PELVIS WITH CONTRAST TECHNIQUE: Multidetector CT imaging of the abdomen and pelvis was performed using the standard protocol following bolus administration of intravenous contrast. RADIATION DOSE REDUCTION: This exam was performed according to the departmental dose-optimization program which includes automated exposure control, adjustment of the mA and/or kV according to patient size and/or use of iterative reconstruction technique. CONTRAST:  75mL OMNIPAQUE  IOHEXOL  300 MG/ML  SOLN COMPARISON:  PET-CT 06/15/2023. CT chest abdomen and pelvis 06/13/2023. FINDINGS: Lower chest: Linear scarring in the lung bases. Hepatobiliary: Layering stones or sludge in the gallbladder. No gallbladder wall thickening. Bile ducts are not dilated. No focal liver lesions. Pancreas: Unremarkable. No pancreatic ductal dilatation or surrounding inflammatory changes. Spleen: Normal in size  without focal abnormality. Adrenals/Urinary Tract: No adrenal gland nodules. Prominent right renal atrophy. No hydronephrosis or hydroureter. Bladder is normal. Bilateral renal cysts. Largest on the left measures 1.6 cm diameter. No imaging follow-up is indicated. Stomach/Bowel: Stomach, small bowel, and colon are not abnormally distended. Stool fills the colon. Colonic diverticulosis without evidence of acute diverticulitis. No wall thickening or inflammatory changes. Appendix is normal. Vascular/Lymphatic: Aortic calcification. 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 Stent graft is patent. No significant lymphadenopathy. Reproductive: Large calcification posterior to the uterus likely representing calcified fibroid. No abnormal adnexal masses. Other: No free air or free fluid. Abdominal wall musculature appears intact. Musculoskeletal: Postoperative right hip hemiarthroplasty. Mild degenerative changes in the spine. IMPRESSION: 1. No acute process demonstrated in the abdomen or pelvis. No evidence of bowel obstruction or inflammation. 2. Chronic atrophy of the right kidney. 3. Layering stones or sludge in the gallbladder without evidence of acute cholecystitis. 4. Aortic atherosclerosis. Postoperative changes of aortoiliac stent graft. Electronically Signed   By: Elsie Gravely M.D.   On: 06/22/2023 20:12    Assessment:   # Clostridium Ramosum bacteremia - present on last hospitalization and treated with abx, this raises concern for GI malignancy - previously recommended to undergo bidirectional endoscopy during last hospitalization but ultimately declined. Represents this time after rust/dark brown bowel movement to be evaluated  # leukocytosis  # afib on chronic anticoag  # hematochezia- rust colored + dark colored bm  # Abnormal/Change in bowel habits  # Chronic mesenteric ischemia  # CLL  # Pancytopenia  # HTN  #  s/p R hip replacement  # CKD  Other  comorbidities taken into consideration prior to endoscopy/sedation  Plan:   Esophagogastroduodenoscopy and Colonoscopy planned for tomorrow pending patient stability and endoscopy suite availability Have also discussed video capsule endoscopy if patient's bidirectional endoscopy is negative Clear liquids now NPO at midnight. Ok to drink colonoscopy prep after midnight Ducolax po x1 Start GoLytely  prep at 1600.  Patient to complete first gallon.  If stool is not clear after first gallon, have ordered second gallon as needed. Labs in am- bmp, cbc, inr Check infectious stool  Hold dvt ppx.  Eliquis  also on hold.  Last dose the latest was evening dose on 06/22/2023  Monitor H&H.  Transfusion and resuscitation as per primary team Avoid frequent lab draws to prevent lab induced anemia Supportive care and antiemetics as per primary team Maintain two sites IV access Avoid nsaids Monitor for GIB.  Esophagogastroduodenoscopy and Colonoscopy with possible biopsy, control of bleeding, polypectomy, and interventions as necessary has been discussed with the patient/patient representative. Informed consent was obtained from the patient/patient representative after explaining the indication, nature, and risks of the procedure including but not limited to death, bleeding, perforation, missed neoplasm/lesions, cardiorespiratory compromise, and reaction to medications. Opportunity for questions was given and appropriate answers were provided. Patient/patient representative has verbalized understanding is amenable to undergoing the procedure.  Informed consent was obtained from the patient/patient representative after explaining the indication, nature, risks of the procedure, and alternatives of Givens capsule including but not limited to the rare risk of perforation or Given's capsule becoming lodged in the GI tract requiring surgical removal.  Opportunity for questions was given and appropriate answers were  provided. Patient/patient representative has verbalized understanding is amenable to undergoing the procedure.  Management of other medical comorbidities as per primary team  I personally performed the service.  Thank you for allowing us  to participate in this patient's care. Please don't hesitate to call if any questions or concerns arise.   Elspeth Ozell Jungling, DO [facility]  Portions of the record may have been created with voice recognition software. Occasional wrong-word or 'sound-a-like' substitutions may have occurred due to the inherent limitations of voice recognition software.  Read the chart carefully and recognize, using context, where substitutions may have occurred.

## 2023-06-24 NOTE — Plan of Care (Signed)

## 2023-06-24 NOTE — Hospital Course (Addendum)
 Tiffany Martinez is a 89 y.o. female with medical history significant for anxiety, CHF, CLL, hypertension, dyslipidemia, migraine, PAD, paroxysmal atrial fibrillation, who presented to the emergency room with acute onset of recurrent nausea, vomiting with diarrhea over the last day with associated abdominal cramps. was recently admitted for Clostridium bacteremia and continues antibiotic therapy with p.o. Augmentin . Discharged 1/30.  Repeat blood culture positive in all bottles.  Seen by ID, Unasyn  started.  EGD showed gastritis, colonoscopy removed several polyps.  No malignancy identified. Per ID, patient be treated with 2 weeks of Augmentin .

## 2023-06-24 NOTE — Progress Notes (Signed)
 Late Entry: B. Boston Byers, provider on-call, notified of pt's elevated SBPs (170s). No orders received, will continue to monitor.

## 2023-06-25 DIAGNOSIS — N1832 Chronic kidney disease, stage 3b: Secondary | ICD-10-CM

## 2023-06-25 DIAGNOSIS — K529 Noninfective gastroenteritis and colitis, unspecified: Secondary | ICD-10-CM | POA: Diagnosis not present

## 2023-06-25 DIAGNOSIS — R7881 Bacteremia: Secondary | ICD-10-CM | POA: Diagnosis not present

## 2023-06-25 DIAGNOSIS — C9110 Chronic lymphocytic leukemia of B-cell type not having achieved remission: Secondary | ICD-10-CM | POA: Diagnosis not present

## 2023-06-25 LAB — BASIC METABOLIC PANEL
Anion gap: 8 (ref 5–15)
BUN: 20 mg/dL (ref 8–23)
CO2: 23 mmol/L (ref 22–32)
Calcium: 9.2 mg/dL (ref 8.9–10.3)
Chloride: 108 mmol/L (ref 98–111)
Creatinine, Ser: 1.47 mg/dL — ABNORMAL HIGH (ref 0.44–1.00)
GFR, Estimated: 34 mL/min — ABNORMAL LOW (ref >60)
Glucose, Bld: 86 mg/dL (ref 70–99)
Potassium: 3.6 mmol/L (ref 3.5–5.1)
Sodium: 139 mmol/L (ref 135–145)

## 2023-06-25 LAB — CBC
HCT: 27.2 % — ABNORMAL LOW (ref 36.0–46.0)
Hemoglobin: 9.5 g/dL — ABNORMAL LOW (ref 12.0–15.0)
MCH: 35.6 pg — ABNORMAL HIGH (ref 26.0–34.0)
MCHC: 34.9 g/dL (ref 30.0–36.0)
MCV: 101.9 fL — ABNORMAL HIGH (ref 80.0–100.0)
Platelets: 113 10*3/uL — ABNORMAL LOW (ref 150–400)
RBC: 2.67 MIL/uL — ABNORMAL LOW (ref 3.87–5.11)
RDW: 14.3 % (ref 11.5–15.5)
WBC: 16 10*3/uL — ABNORMAL HIGH (ref 4.0–10.5)
nRBC: 0 % (ref 0.0–0.2)

## 2023-06-25 LAB — PROTIME-INR
INR: 1.1 (ref 0.8–1.2)
Prothrombin Time: 14.3 s (ref 11.4–15.2)

## 2023-06-25 MED ORDER — CLONIDINE HCL ER 0.1 MG PO TB12
0.1000 mg | ORAL_TABLET | Freq: Every day | ORAL | Status: DC
  Administered 2023-06-26: 0.1 mg via ORAL
  Filled 2023-06-25: qty 1

## 2023-06-25 NOTE — Progress Notes (Signed)
 Approximately 1800-- This RN messaged MD Onita to provide update. Pt is still working on the first bottle of bowel prep-- has about 3 cups left to go of this. Pt has received consistent encouragement and education regarding need to complete current bottle of bowel prep, and possibly a second bottle. Pt has had approximately 3 bowel movements throughout the shift. Last BM visualized is brown, liquid, slightly clear.

## 2023-06-25 NOTE — Progress Notes (Signed)
 Inpatient Follow-up/Progress Note   Patient ID: Tiffany Martinez is a 89 y.o. female.  Overnight Events / Subjective Findings Patient unable to complete prep overnight.  Still having brown bowel movements.  Will need to postpone upper and lower endoscopy. Some dizziness overnight No other acute GI complaints   Review of Systems  Constitutional:  Negative for activity change, appetite change, chills, diaphoresis, fatigue, fever and unexpected weight change.  HENT:  Negative for trouble swallowing and voice change.   Respiratory:  Negative for shortness of breath and wheezing.   Cardiovascular:  Negative for chest pain, palpitations and leg swelling.  Gastrointestinal:  Positive for constipation. Negative for abdominal distention, abdominal pain, anal bleeding, blood in stool, diarrhea, nausea, rectal pain and vomiting.  Musculoskeletal:  Negative for arthralgias and myalgias.  Skin:  Negative for color change and pallor.  Neurological:  Positive for dizziness. Negative for syncope and weakness.  Psychiatric/Behavioral:  Negative for confusion.   All other systems reviewed and are negative.    Medications  Current Facility-Administered Medications:    acetaminophen  (TYLENOL ) tablet 650 mg, 650 mg, Oral, Q6H PRN **OR** acetaminophen  (TYLENOL ) suppository 650 mg, 650 mg, Rectal, Q6H PRN, Mansy, Jan A, MD   amLODipine  (NORVASC ) tablet 10 mg, 10 mg, Oral, Daily, Lenon Marien CROME, MD, 10 mg at 06/24/23 1028   Ampicillin -Sulbactam (UNASYN ) 3 g in sodium chloride  0.9 % 100 mL IVPB, 3 g, Intravenous, Q12H, Mansy, Jan A, MD, Stopped at 06/24/23 2155   loratadine  (CLARITIN ) tablet 10 mg, 10 mg, Oral, Daily, Mansy, Jan A, MD, 10 mg at 06/24/23 1028   losartan  (COZAAR ) tablet 100 mg, 100 mg, Oral, Daily, Zhang, Dekui, MD, 100 mg at 06/24/23 1227   metoprolol  succinate (TOPROL -XL) 24 hr tablet 12.5 mg, 12.5 mg, Oral, Daily, Zhang, Dekui, MD, 12.5 mg at 06/24/23 1633   multivitamin with  minerals tablet, , Oral, Daily, Mansy, Jan A, MD, 1 tablet at 06/24/23 1028   ondansetron  (ZOFRAN ) tablet 4 mg, 4 mg, Oral, Q6H PRN **OR** ondansetron  (ZOFRAN ) injection 4 mg, 4 mg, Intravenous, Q6H PRN, Mansy, Madison LABOR, MD   Oral care mouth rinse, 15 mL, Mouth Rinse, PRN, Lenon Marien CROME, MD   polyethylene glycol-electrolytes (NuLYTELY ) solution 2,000 mL, 2,000 mL, Oral, Once PRN, Onita Elspeth Sharper, DO   traZODone  (DESYREL ) tablet 25 mg, 25 mg, Oral, QHS PRN, Mansy, Jan A, MD  ampicillin -sulbactam (UNASYN ) IV Stopped (06/24/23 2155)    acetaminophen  **OR** acetaminophen , ondansetron  **OR** ondansetron  (ZOFRAN ) IV, mouth rinse, polyethylene glycol-electrolytes, traZODone    Objective    Vitals:   06/24/23 1812 06/24/23 1959 06/24/23 2345 06/25/23 0019  BP: (!) 162/68 (!) 168/65 (!) 194/65 (!) 181/58  Pulse: (!) 57 (!) 58 65 (!) 59  Resp:  16    Temp:  98.2 F (36.8 C)    TempSrc:  Oral    SpO2:  95%    Weight:      Height:         Physical Exam Vitals and nursing note reviewed.  Constitutional:      General: She is not in acute distress.    Appearance: Normal appearance. She is not ill-appearing, toxic-appearing or diaphoretic.  HENT:     Head: Normocephalic and atraumatic.     Nose: Nose normal.     Mouth/Throat:     Mouth: Mucous membranes are moist.     Pharynx: Oropharynx is clear.  Eyes:     General: No scleral icterus.    Extraocular Movements: Extraocular  movements intact.  Cardiovascular:     Rate and Rhythm: Regular rhythm. Bradycardia present.     Heart sounds: Normal heart sounds. No murmur heard.    No friction rub. No gallop.  Pulmonary:     Effort: Pulmonary effort is normal. No respiratory distress.     Breath sounds: Normal breath sounds. No wheezing, rhonchi or rales.  Abdominal:     General: Bowel sounds are normal. There is no distension.     Palpations: Abdomen is soft.     Tenderness: There is no abdominal tenderness. There is no guarding or  rebound.  Musculoskeletal:     Cervical back: Neck supple.     Right lower leg: No edema.     Left lower leg: No edema.  Skin:    General: Skin is warm and dry.     Coloration: Skin is not jaundiced or pale.  Neurological:     General: No focal deficit present.     Mental Status: She is alert and oriented to person, place, and time. Mental status is at baseline.  Psychiatric:        Mood and Affect: Mood normal.        Behavior: Behavior normal.        Thought Content: Thought content normal.        Judgment: Judgment normal.      Laboratory Data Recent Labs  Lab 06/22/23 1906 06/23/23 0554 06/25/23 0503  WBC 29.3* 16.6* 16.0*  HGB 10.3* 8.7* 9.5*  HCT 28.8* 23.8* 27.2*  PLT 146* 113* 113*  NEUTOPHILPCT 21  --   --   LYMPHOPCT 77  --   --   MONOPCT 2  --   --   EOSPCT 0  --   --    Recent Labs  Lab 06/22/23 1906 06/23/23 0554 06/25/23 0503  NA 141 140 139  K 4.5 3.7 3.6  CL 110 111 108  CO2 22 22 23   BUN 27* 23 20  CREATININE 1.63* 1.40* 1.47*  CALCIUM  9.5 8.9 9.2  PROT 6.0*  --   --   BILITOT 0.6  --   --   ALKPHOS 56  --   --   ALT 17  --   --   AST 24  --   --   GLUCOSE 139* 96 86   Recent Labs  Lab 06/25/23 0503  INR 1.1      Imaging Studies: No results found.  Assessment:   # Clostridium Ramosum bacteremia - present on last hospitalization and treated with abx, this raises concern for GI malignancy - previously recommended to undergo bidirectional endoscopy during last hospitalization but ultimately declined. Represents this time after rust/dark brown bowel movement to be evaluated  # leukocytosis   # afib on chronic anticoag   # hematochezia- rust colored + dark colored bm   # Abnormal/Change in bowel habits   # Chronic mesenteric ischemia   # CLL   # Pancytopenia   # HTN   # s/p R hip replacement   # CKD   Other comorbidities taken into consideration prior to endoscopy/sedation  Plan:  Patient unable to complete prep  overnight for procedures today.  Will reschedule procedures to tomorrow pending patient prep. Can resume clear liquids Continue and finish bowel prep. May need second gallon (ordered prn)- does not necessarily need to complete entire second gallon if she is having clear bowel movements NPO at midnight. Ok to drink colonoscopy prep after midnight  Labs in am- bmp, cbc, inr   Hold dvt ppx.  Eliquis  also on hold.  Last dose the latest was evening dose on 06/22/2023   Monitor H&H.  Transfusion and resuscitation as per primary team Avoid frequent lab draws to prevent lab induced anemia Supportive care and antiemetics as per primary team Maintain two sites IV access Avoid nsaids  Management of other medical comorbidities as per primary team  I personally performed the service.  Thank you for allowing us  to participate in this patient's care. Please don't hesitate to call if any questions or concerns arise.   Elspeth Ozell Jungling, DO [facility]  Portions of the record may have been created with voice recognition software. Occasional wrong-word or 'sound-a-like' substitutions may have occurred due to the inherent limitations of voice recognition software.  Read the chart carefully and recognize, using context, where substitutions may have occurred.

## 2023-06-25 NOTE — H&P (View-Only) (Signed)
 Inpatient Follow-up/Progress Note   Patient ID: Tiffany Martinez is a 89 y.o. female.  Overnight Events / Subjective Findings Patient unable to complete prep overnight.  Still having brown bowel movements.  Will need to postpone upper and lower endoscopy. Some dizziness overnight No other acute GI complaints   Review of Systems  Constitutional:  Negative for activity change, appetite change, chills, diaphoresis, fatigue, fever and unexpected weight change.  HENT:  Negative for trouble swallowing and voice change.   Respiratory:  Negative for shortness of breath and wheezing.   Cardiovascular:  Negative for chest pain, palpitations and leg swelling.  Gastrointestinal:  Positive for constipation. Negative for abdominal distention, abdominal pain, anal bleeding, blood in stool, diarrhea, nausea, rectal pain and vomiting.  Musculoskeletal:  Negative for arthralgias and myalgias.  Skin:  Negative for color change and pallor.  Neurological:  Positive for dizziness. Negative for syncope and weakness.  Psychiatric/Behavioral:  Negative for confusion.   All other systems reviewed and are negative.    Medications  Current Facility-Administered Medications:    acetaminophen  (TYLENOL ) tablet 650 mg, 650 mg, Oral, Q6H PRN **OR** acetaminophen  (TYLENOL ) suppository 650 mg, 650 mg, Rectal, Q6H PRN, Mansy, Jan A, MD   amLODipine  (NORVASC ) tablet 10 mg, 10 mg, Oral, Daily, Lenon Marien CROME, MD, 10 mg at 06/24/23 1028   Ampicillin -Sulbactam (UNASYN ) 3 g in sodium chloride  0.9 % 100 mL IVPB, 3 g, Intravenous, Q12H, Mansy, Jan A, MD, Stopped at 06/24/23 2155   loratadine  (CLARITIN ) tablet 10 mg, 10 mg, Oral, Daily, Mansy, Jan A, MD, 10 mg at 06/24/23 1028   losartan  (COZAAR ) tablet 100 mg, 100 mg, Oral, Daily, Zhang, Dekui, MD, 100 mg at 06/24/23 1227   metoprolol  succinate (TOPROL -XL) 24 hr tablet 12.5 mg, 12.5 mg, Oral, Daily, Zhang, Dekui, MD, 12.5 mg at 06/24/23 1633   multivitamin with  minerals tablet, , Oral, Daily, Mansy, Jan A, MD, 1 tablet at 06/24/23 1028   ondansetron  (ZOFRAN ) tablet 4 mg, 4 mg, Oral, Q6H PRN **OR** ondansetron  (ZOFRAN ) injection 4 mg, 4 mg, Intravenous, Q6H PRN, Mansy, Madison LABOR, MD   Oral care mouth rinse, 15 mL, Mouth Rinse, PRN, Lenon Marien CROME, MD   polyethylene glycol-electrolytes (NuLYTELY ) solution 2,000 mL, 2,000 mL, Oral, Once PRN, Onita Elspeth Sharper, DO   traZODone  (DESYREL ) tablet 25 mg, 25 mg, Oral, QHS PRN, Mansy, Jan A, MD  ampicillin -sulbactam (UNASYN ) IV Stopped (06/24/23 2155)    acetaminophen  **OR** acetaminophen , ondansetron  **OR** ondansetron  (ZOFRAN ) IV, mouth rinse, polyethylene glycol-electrolytes, traZODone    Objective    Vitals:   06/24/23 1812 06/24/23 1959 06/24/23 2345 06/25/23 0019  BP: (!) 162/68 (!) 168/65 (!) 194/65 (!) 181/58  Pulse: (!) 57 (!) 58 65 (!) 59  Resp:  16    Temp:  98.2 F (36.8 C)    TempSrc:  Oral    SpO2:  95%    Weight:      Height:         Physical Exam Vitals and nursing note reviewed.  Constitutional:      General: She is not in acute distress.    Appearance: Normal appearance. She is not ill-appearing, toxic-appearing or diaphoretic.  HENT:     Head: Normocephalic and atraumatic.     Nose: Nose normal.     Mouth/Throat:     Mouth: Mucous membranes are moist.     Pharynx: Oropharynx is clear.  Eyes:     General: No scleral icterus.    Extraocular Movements: Extraocular  movements intact.  Cardiovascular:     Rate and Rhythm: Regular rhythm. Bradycardia present.     Heart sounds: Normal heart sounds. No murmur heard.    No friction rub. No gallop.  Pulmonary:     Effort: Pulmonary effort is normal. No respiratory distress.     Breath sounds: Normal breath sounds. No wheezing, rhonchi or rales.  Abdominal:     General: Bowel sounds are normal. There is no distension.     Palpations: Abdomen is soft.     Tenderness: There is no abdominal tenderness. There is no guarding or  rebound.  Musculoskeletal:     Cervical back: Neck supple.     Right lower leg: No edema.     Left lower leg: No edema.  Skin:    General: Skin is warm and dry.     Coloration: Skin is not jaundiced or pale.  Neurological:     General: No focal deficit present.     Mental Status: She is alert and oriented to person, place, and time. Mental status is at baseline.  Psychiatric:        Mood and Affect: Mood normal.        Behavior: Behavior normal.        Thought Content: Thought content normal.        Judgment: Judgment normal.      Laboratory Data Recent Labs  Lab 06/22/23 1906 06/23/23 0554 06/25/23 0503  WBC 29.3* 16.6* 16.0*  HGB 10.3* 8.7* 9.5*  HCT 28.8* 23.8* 27.2*  PLT 146* 113* 113*  NEUTOPHILPCT 21  --   --   LYMPHOPCT 77  --   --   MONOPCT 2  --   --   EOSPCT 0  --   --    Recent Labs  Lab 06/22/23 1906 06/23/23 0554 06/25/23 0503  NA 141 140 139  K 4.5 3.7 3.6  CL 110 111 108  CO2 22 22 23   BUN 27* 23 20  CREATININE 1.63* 1.40* 1.47*  CALCIUM  9.5 8.9 9.2  PROT 6.0*  --   --   BILITOT 0.6  --   --   ALKPHOS 56  --   --   ALT 17  --   --   AST 24  --   --   GLUCOSE 139* 96 86   Recent Labs  Lab 06/25/23 0503  INR 1.1      Imaging Studies: No results found.  Assessment:   # Clostridium Ramosum bacteremia - present on last hospitalization and treated with abx, this raises concern for GI malignancy - previously recommended to undergo bidirectional endoscopy during last hospitalization but ultimately declined. Represents this time after rust/dark brown bowel movement to be evaluated  # leukocytosis   # afib on chronic anticoag   # hematochezia- rust colored + dark colored bm   # Abnormal/Change in bowel habits   # Chronic mesenteric ischemia   # CLL   # Pancytopenia   # HTN   # s/p R hip replacement   # CKD   Other comorbidities taken into consideration prior to endoscopy/sedation  Plan:  Patient unable to complete prep  overnight for procedures today.  Will reschedule procedures to tomorrow pending patient prep. Can resume clear liquids Continue and finish bowel prep. May need second gallon (ordered prn)- does not necessarily need to complete entire second gallon if she is having clear bowel movements NPO at midnight. Ok to drink colonoscopy prep after midnight  Labs in am- bmp, cbc, inr   Hold dvt ppx.  Eliquis  also on hold.  Last dose the latest was evening dose on 06/22/2023   Monitor H&H.  Transfusion and resuscitation as per primary team Avoid frequent lab draws to prevent lab induced anemia Supportive care and antiemetics as per primary team Maintain two sites IV access Avoid nsaids  Management of other medical comorbidities as per primary team  I personally performed the service.  Thank you for allowing us  to participate in this patient's care. Please don't hesitate to call if any questions or concerns arise.   Elspeth Ozell Jungling, DO [facility]  Portions of the record may have been created with voice recognition software. Occasional wrong-word or 'sound-a-like' substitutions may have occurred due to the inherent limitations of voice recognition software.  Read the chart carefully and recognize, using context, where substitutions may have occurred.

## 2023-06-25 NOTE — Progress Notes (Signed)
  Progress Note   Patient: Tiffany Martinez FMW:[id number] DOB: [birth year] DOA: 06/22/2023     2 DOS: the patient was seen and examined on 06/25/2023   Brief hospital course: TRENACE COUGHLIN is a 89 y.o. female with medical history significant for anxiety, CHF, CLL, hypertension, dyslipidemia, migraine, PAD, paroxysmal atrial fibrillation, who presented to the emergency room with acute onset of recurrent nausea, vomiting with diarrhea over the last day with associated abdominal cramps. was recently admitted for Clostridium bacteremia and continues antibiotic therapy with p.o. Augmentin . Discharged 1/30.  Repeat blood culture positive in all bottles.  Seen by ID, Unasyn  started.  Also recommended EGD and colonoscopy.   Principal Problem:   Acute gastroenteritis Active Problems:   Cholelithiasis   Chronic diastolic heart failure (HCC)   CLL (chronic lymphocytic leukemia) (HCC)   Paroxysmal atrial fibrillation (HCC)   Bacteremia   CKD stage 3b, GFR 30-44 ml/min (HCC)   Essential hypertension   Assessment and Plan:  Acute gastroenteritis Clostridium ramosum bacteremia-  Patient has been evaluated by ID, need to rule out colon cancer with EGD/colonoscopy.  Continue Unasyn . Patient is seen by GI, pending EGD/colonoscopy.   Chronic kidney disease stage IIIb. Renal function still stable.     Cholelithiasis- not symptomatic. No signs of inflammation, obstruction   Essential hypertension Restarted losartan  in addition to beta-blocker and amlodipine . Blood pressure still running high, added clonidine .   Paroxysmal atrial fibrillation (HCC) Continue beta-blocker, discontinue Eliquis  pending GI workup.   CLL (chronic lymphocytic leukemia) (HCC) - monitor CBC with differential.     Subjective:  Patient doing well today, did not finish her bowel prep.  Physical Exam: Vitals:   06/24/23 1959 06/24/23 2345 06/25/23 0019 06/25/23 0736  BP: (!) 168/65 (!) 194/65 (!) 181/58 (!)  175/58  Pulse: (!) 58 65 (!) 59 (!) 55  Resp: 16   16  Temp: 98.2 F (36.8 C)   98 F (36.7 C)  TempSrc: Oral   Oral  SpO2: 95%   95%  Weight:      Height:       General exam: Appears calm and comfortable  Respiratory system: Clear to auscultation. Respiratory effort normal. Cardiovascular system: S1 & S2 heard, RRR. No JVD, murmurs, rubs, gallops or clicks. No pedal edema. Gastrointestinal system: Abdomen is nondistended, soft and nontender. No organomegaly or masses felt. Normal bowel sounds heard. Central nervous system: Alert and oriented. No focal neurological deficits. Extremities: Symmetric 5 x 5 power. Skin: No rashes, lesions or ulcers Psychiatry: Judgement and insight appear normal. Mood & affect appropriate.    Data Reviewed:  Lab results reviewed.  Family Communication: None  Disposition: Status is: Inpatient Remains inpatient appropriate because: Severity of disease, IV treatment, pending inpatient workup.     Time spent: 35 minutes  Author: Murvin Mana, MD 06/25/2023 11:30 AM  For on call review [email or web address].

## 2023-06-25 NOTE — Progress Notes (Signed)
 Paged Tiffany Martinez, on-call for provider regarding pt getting very dizzy moving to [facility] for first BM. Was not diaphoretic and never lost consciousness. Vitals assessed on BSC and BP was 194/65. After returning to bed BP was 181/58. HR 50s-60s.  Page promptly returned and order received to place pt on tele for closer monitoring.

## 2023-06-25 NOTE — Progress Notes (Signed)
 Dr. Mamie Searles updated by this RN on pt's inability to complete bowel prep despite repeated education throughout shift. Pt consumed approximately 1/3 of prep and still has brown watery stools with small pieces.

## 2023-06-25 NOTE — Progress Notes (Signed)
 Approximately 0730--This RN messaged MD Onita regarding pt. Per nightshift RN, pt had difficulty consuming bowel prep. Currently, only 1/3 of bowel prep consumed. Repeated education provided by this RN on the importance of completing prep.   MD Onita verbalized that colonoscopy procedure would need to be postponed for tomorrow and pt should be encouraged throughout shift today to continue to complete bowel prep. Patient educated and verbalized understanding. RN to continue to monitor and observe pt's intake and stools.

## 2023-06-25 NOTE — Plan of Care (Signed)

## 2023-06-26 ENCOUNTER — Inpatient Hospital Stay: Payer: Self-pay | Admitting: Anesthesiology

## 2023-06-26 ENCOUNTER — Other Ambulatory Visit: Payer: Self-pay

## 2023-06-26 ENCOUNTER — Encounter
Admission: EM | Disposition: A | Discharge status: Skilled Nursing Facility | Payer: Self-pay | Source: Home / Self Care | Attending: Internal Medicine

## 2023-06-26 ENCOUNTER — Encounter: Payer: Self-pay | Admitting: Student in an Organized Health Care Education/Training Program

## 2023-06-26 DIAGNOSIS — I5032 Chronic diastolic (congestive) heart failure: Secondary | ICD-10-CM

## 2023-06-26 DIAGNOSIS — R7881 Bacteremia: Secondary | ICD-10-CM | POA: Diagnosis not present

## 2023-06-26 DIAGNOSIS — B9689 Other specified bacterial agents as the cause of diseases classified elsewhere: Secondary | ICD-10-CM | POA: Diagnosis not present

## 2023-06-26 DIAGNOSIS — C9110 Chronic lymphocytic leukemia of B-cell type not having achieved remission: Secondary | ICD-10-CM | POA: Diagnosis not present

## 2023-06-26 DIAGNOSIS — K2960 Other gastritis without bleeding: Secondary | ICD-10-CM | POA: Insufficient documentation

## 2023-06-26 DIAGNOSIS — K635 Polyp of colon: Secondary | ICD-10-CM | POA: Insufficient documentation

## 2023-06-26 DIAGNOSIS — N1832 Chronic kidney disease, stage 3b: Secondary | ICD-10-CM | POA: Diagnosis not present

## 2023-06-26 DIAGNOSIS — K529 Noninfective gastroenteritis and colitis, unspecified: Secondary | ICD-10-CM | POA: Diagnosis not present

## 2023-06-26 HISTORY — PX: HOT HEMOSTASIS: SHX5433

## 2023-06-26 HISTORY — PX: ESOPHAGOGASTRODUODENOSCOPY (EGD) WITH PROPOFOL: SHX5813

## 2023-06-26 HISTORY — PX: BIOPSY: SHX5522

## 2023-06-26 HISTORY — PX: COLONOSCOPY WITH PROPOFOL: SHX5780

## 2023-06-26 LAB — BASIC METABOLIC PANEL
Anion gap: 8 (ref 5–15)
BUN: 17 mg/dL (ref 8–23)
CO2: 21 mmol/L — ABNORMAL LOW (ref 22–32)
Calcium: 9.3 mg/dL (ref 8.9–10.3)
Chloride: 109 mmol/L (ref 98–111)
Creatinine, Ser: 1.39 mg/dL — ABNORMAL HIGH (ref 0.44–1.00)
GFR, Estimated: 37 mL/min — ABNORMAL LOW (ref >60)
Glucose, Bld: 87 mg/dL (ref 70–99)
Potassium: 3.6 mmol/L (ref 3.5–5.1)
Sodium: 138 mmol/L (ref 135–145)

## 2023-06-26 LAB — CBC
HCT: 28.3 % — ABNORMAL LOW (ref 36.0–46.0)
Hemoglobin: 9.7 g/dL — ABNORMAL LOW (ref 12.0–15.0)
MCH: 35.7 pg — ABNORMAL HIGH (ref 26.0–34.0)
MCHC: 34.3 g/dL (ref 30.0–36.0)
MCV: 104 fL — ABNORMAL HIGH (ref 80.0–100.0)
Platelets: 105 10*3/uL — ABNORMAL LOW (ref 150–400)
RBC: 2.72 MIL/uL — ABNORMAL LOW (ref 3.87–5.11)
RDW: 14.2 % (ref 11.5–15.5)
WBC: 14.2 10*3/uL — ABNORMAL HIGH (ref 4.0–10.5)
nRBC: 0.4 % — ABNORMAL HIGH (ref 0.0–0.2)

## 2023-06-26 SURGERY — ESOPHAGOGASTRODUODENOSCOPY (EGD) WITH PROPOFOL
Anesthesia: General

## 2023-06-26 MED ORDER — LIDOCAINE HCL (PF) 2 % IJ SOLN
INTRAMUSCULAR | Status: AC
  Filled 2023-06-26: qty 5

## 2023-06-26 MED ORDER — CLONIDINE HCL 0.1 MG/24HR TD PTWK
0.1000 mg | MEDICATED_PATCH | TRANSDERMAL | 0 refills | Status: DC
  Filled 2023-06-26: qty 4, 28d supply, fill #0

## 2023-06-26 MED ORDER — SODIUM CHLORIDE 0.9 % IV SOLN: INTRAVENOUS | Status: DC

## 2023-06-26 MED ORDER — PROPOFOL 1000 MG/100ML IV EMUL
INTRAVENOUS | Status: AC
  Filled 2023-06-26: qty 100

## 2023-06-26 MED ORDER — PROPOFOL 500 MG/50ML IV EMUL
INTRAVENOUS | Status: DC | PRN
  Administered 2023-06-26: 150 ug/kg/min via INTRAVENOUS

## 2023-06-26 MED ORDER — ONDANSETRON HCL 4 MG/2ML IJ SOLN
INTRAMUSCULAR | Status: DC | PRN
  Administered 2023-06-26: 4 mg via INTRAVENOUS

## 2023-06-26 MED ORDER — APIXABAN 2.5 MG PO TABS: 2.5000 mg | ORAL_TABLET | Freq: Two times a day (BID) | ORAL | Status: AC

## 2023-06-26 MED ORDER — PANTOPRAZOLE SODIUM 40 MG PO TBEC
40.0000 mg | DELAYED_RELEASE_TABLET | Freq: Two times a day (BID) | ORAL | 0 refills | Status: DC
  Filled 2023-06-26: qty 30, 15d supply, fill #0

## 2023-06-26 MED ORDER — PROPOFOL 10 MG/ML IV BOLUS
INTRAVENOUS | Status: DC | PRN
  Administered 2023-06-26: 80 mg via INTRAVENOUS

## 2023-06-26 MED ORDER — LIDOCAINE HCL (CARDIAC) PF 100 MG/5ML IV SOSY
PREFILLED_SYRINGE | INTRAVENOUS | Status: DC | PRN
  Administered 2023-06-26: 40 mg via INTRAVENOUS

## 2023-06-26 MED ORDER — AMOXICILLIN-POT CLAVULANATE 500-125 MG PO TABS
1.0000 | ORAL_TABLET | Freq: Two times a day (BID) | ORAL | 0 refills | Status: AC
  Filled 2023-06-26: qty 28, 14d supply, fill #0

## 2023-06-26 MED ORDER — DEXMEDETOMIDINE HCL IN NACL 80 MCG/20ML IV SOLN
INTRAVENOUS | Status: DC | PRN
  Administered 2023-06-26: 8 ug via INTRAVENOUS

## 2023-06-26 NOTE — Progress Notes (Signed)
 Attempted to administer capsule but pt refused. Pt alert, verbal with clear understanding of capsule study. Pt stable with no complaints. PACU nurse at bedside. MD notified

## 2023-06-26 NOTE — Progress Notes (Signed)
 Date of Admission:  06/22/2023     ID: Tiffany Martinez is a 89 y.o. female  Principal Problem:   Acute gastroenteritis Active Problems:   Chronic diastolic heart failure (HCC)   CLL (chronic lymphocytic leukemia) (HCC)   Paroxysmal atrial fibrillation (HCC)   Bacteremia   CKD stage 3b, GFR 30-44 ml/min (HCC)   Essential hypertension   Cholelithiasis   Erosive gastritis   Colon polyps    Subjective: Pt underwent colonosocpy and upper endoscopy Other than polyps no lesions She is not interested in video capsule test  Medications:   amLODipine   10 mg Oral Daily   cloNIDine  HCl  0.1 mg Oral Q0600   loratadine   10 mg Oral Daily   losartan   100 mg Oral Daily   metoprolol  succinate  12.5 mg Oral Daily   multivitamin with minerals   Oral Daily    Objective: Vital signs in last 24 hours: Patient Vitals for the past 24 hrs:  BP Temp Temp src Pulse Resp SpO2 Height Weight  06/26/23 1103 (!) 195/57 -- -- (!) 58 15 -- -- --  06/26/23 1053 -- -- -- -- -- 100 % -- --  06/26/23 1051 (!) 174/59 (!) 97.5 F (36.4 C) Oral (!) 58 15 100 % -- --  06/26/23 1013 (!) 182/47 (!) 97.4 F (36.3 C) Oral (!) 51 16 95 % -- --  06/26/23 0952 (!) 142/66 -- -- -- 18 99 % -- --  06/26/23 0942 -- -- -- -- 18 99 % -- --  06/26/23 0932 -- -- -- -- 18 -- -- --  06/26/23 0922 (!) 181/52 (!) 97 F (36.1 C) Temporal (!) 56 18 99 % -- --  06/26/23 0921 (!) 181/52 -- -- (!) 52 18 98 % -- --  06/26/23 0747 (!) 171/58 98.3 F (36.8 C) Oral 61 18 99 % 5' 4 (1.626 m) 61.2 kg  06/26/23 0354 (!) 169/58 97.8 F (36.6 C) Oral (!) 59 16 97 % -- --  06/25/23 2003 (!) 188/58 97.9 F (36.6 C) Oral 60 12 98 % -- --  06/25/23 1609 (!) 175/50 98.1 F (36.7 C) Oral (!) 56 16 100 % -- --     PHYSICAL EXAM:  General: Alert, cooperative, no distress, appears stated age.  Head: Normocephalic, without obvious abnormality, atraumatic. Eyes: Conjunctivae clear, anicteric sclerae. Pupils are equal ENT Nares normal.  No drainage or sinus tenderness. Lips, mucosa, and tongue normal. No Thrush Neck: Supple, symmetrical, no adenopathy, thyroid : non tender no carotid bruit and no JVD. Back: No CVA tenderness. Lungs: Clear to auscultation bilaterally. No Wheezing or Rhonchi. No rales. Heart: Regular rate and rhythm, no murmur, rub or gallop. Abdomen: Soft, non-tender,not distended. Bowel sounds normal. No masses Extremities: atraumatic, no cyanosis. No edema. No clubbing Skin: No rashes or lesions. Or bruising Lymph: Cervical, supraclavicular normal. Neurologic: Grossly non-focal  Lab Results    Latest Ref Rng & Units 06/26/2023    4:52 AM 06/25/2023    5:03 AM 06/23/2023    5:54 AM  CBC  WBC 4.0 - 10.5 K/uL 14.2  16.0  16.6   Hemoglobin 12.0 - 15.0 g/dL 9.7  9.5  8.7   Hematocrit 36.0 - 46.0 % 28.3  27.2  23.8   Platelets 150 - 400 K/uL 105  113  113        Latest Ref Rng & Units 06/26/2023    4:52 AM 06/25/2023    5:03 AM 06/23/2023    5:54  AM  CMP  Glucose 70 - 99 mg/dL 87  86  96   BUN 8 - 23 mg/dL 17  20  23    Creatinine 0.44 - 1.00 mg/dL 8.60  8.52  8.59   Sodium 135 - 145 mmol/L 138  139  140   Potassium 3.5 - 5.1 mmol/L 3.6  3.6  3.7   Chloride 98 - 111 mmol/L 109  108  111   CO2 22 - 32 mmol/L 21  23  22    Calcium  8.9 - 10.3 mg/dL 9.3  9.2  8.9       Microbiology: BC- bacillus 1/4 Studies/Results: CT abd /pelvis- gall stones    Assessment/Plan: Clostridium ramosum bacteremia j.  She is currently on IV unasyn  The bacteria in the blood from this admission is bacillus a skin contaminant and no clostridium So the augmentin  she was taking was working ( so on discharge we could do augmentin  for 2 weeks ) TEE was negative CT abdomen and pelvis was negative She has a aortic graft stent and there is no evidence of infection there by PET scan  last admission was normal  Because of diarrhea and possible GI bleed due to the color of stool ( not seen) and alternating constipation/diarrhea she  underwent endoscopy and colonoscopy  multiple polyps resected. No ulcerating lesions or growth  PT is not interested in any further testing She is being discharged      Anemia   H/o Mesenteric ischemia    CLL being observed   A-fib  Discussed the management with the patient in detail and with care team After she competes PO antibiotic , I will ask her PCP at [facility] to do a blood culture after 2 weeks of competing antibiotic Follow with me in [facility]

## 2023-06-26 NOTE — Anesthesia Postprocedure Evaluation (Signed)
 Anesthesia Post Note  Patient: Beonca Gibb Crocker  Procedure(s) Performed: ESOPHAGOGASTRODUODENOSCOPY (EGD) WITH PROPOFOL  COLONOSCOPY WITH PROPOFOL  GIVENS CAPSULE STUDY BIOPSY HOT HEMOSTASIS (ARGON PLASMA COAGULATION/BICAP)  Patient location during evaluation: Endoscopy Anesthesia Type: General Level of consciousness: awake and alert Pain management: pain level controlled Vital Signs Assessment: post-procedure vital signs reviewed and stable Respiratory status: spontaneous breathing, nonlabored ventilation, respiratory function stable and patient connected to nasal cannula oxygen Cardiovascular status: blood pressure returned to baseline and stable Postop Assessment: no apparent nausea or vomiting Anesthetic complications: no  No notable events documented.   Last Vitals:  Vitals:   06/26/23 1053 06/26/23 1103  BP:  (!) 195/57  Pulse:  (!) 58  Resp:  15  Temp:    SpO2: 100%     Last Pain:  Vitals:   06/26/23 1051  TempSrc: Oral  PainSc:                  Debby Mines

## 2023-06-26 NOTE — Interval H&P Note (Signed)
 History and Physical Interval Note: Preprocedure H&P from 06/26/23  was reviewed and there was no interval change after seeing and examining the patient.  Written consent was obtained from the patient after discussion of risks, benefits, and alternatives. Patient has consented to proceed with Esophagogastroduodenoscopy, Colonoscopy, and Givens Capsule  with possible intervention.  Esophagogastroduodenoscopy and Colonoscopy with possible biopsy, control of bleeding, polypectomy, and interventions as necessary has been discussed with the patient/patient representative. Informed consent was obtained from the patient/patient representative after explaining the indication, nature, and risks of the procedure including but not limited to death, bleeding, perforation, missed neoplasm/lesions, cardiorespiratory compromise, and reaction to medications. Opportunity for questions was given and appropriate answers were provided. Patient/patient representative has verbalized understanding is amenable to undergoing the procedure.  Informed consent was obtained from the patient/patient representative after explaining the indication, nature, risks of the procedure, and alternatives of Givens capsule including but not limited to the rare risk of perforation or Given's capsule becoming lodged in the GI tract requiring surgical removal.  Opportunity for questions was given and appropriate answers were provided. Patient/patient representative has verbalized understanding is amenable to undergoing the procedure.     06/26/2023 8:00 AM  Tiffany Martinez  has presented today for surgery, with the diagnosis of clostridum bacteremia, hematochezia.  The various methods of treatment have been discussed with the patient and family. After consideration of risks, benefits and other options for treatment, the patient has consented to  Procedure(s): ESOPHAGOGASTRODUODENOSCOPY (EGD) WITH PROPOFOL  (N/A) COLONOSCOPY WITH PROPOFOL   (N/A) GIVENS CAPSULE STUDY (N/A) as a surgical intervention.  The patient's history has been reviewed, patient examined, no change in status, stable for surgery.  I have reviewed the patient's chart and labs.  Questions were answered to the patient's satisfaction.     Elspeth Ozell Jungling

## 2023-06-26 NOTE — Care Plan (Signed)
 Revisited patient. Much more awake and interactive at this time. Pt is declining capsule study and would like to go home.  OK to resume previous diet.  GI to sign off. Available as needed. Please do not hesitate to call regarding questions or concerns.  Elspeth EMERSON Jungling, DO [facility]astroenter[facility]

## 2023-06-26 NOTE — TOC Initial Note (Addendum)
 Transition of Care [facility]) - Initial/Assessment Note    Patient Details  Name: Tiffany Martinez MRN: [id number] Date of Birth: [birth year]  [facility] CM/SW Contact:    Browning Southwood E Denym Christenberry, LCSW Phone Number: 06/26/2023, 10:01 AM  Clinical Narrative:                 Patient is from [facility]. Spoke with Alfonso at [facility] - they are requesting PT evals and feel patient needs to go to their SNF for STR when discharged. CSW asked MD to order PT evals.  12:48- Updated by MD that patient refuses SNF and wants to return to her [facility] apartment today. Notified Alfonso with [facility] who states patient has this right and she will notify their team. They can follow for Outpatient PT/OT at ILF - just needs [facility] order stating this - asked MD.   3:40- Updated by RN that patient is requesting a cab voucher. Asked Alfonso at [facility] if anyone from the facility can come get patient, she is checking.  3:52- Alfonso with [facility] was able to arrange for [facility] to come pick patient up. They will call 2C when they arrive to [facility] - RN notified.  Expected Discharge Plan: Skilled Nursing Facility Barriers to Discharge: Continued Medical Work up   Patient Goals and CMS Choice            Expected Discharge Plan and Services         Expected Discharge Date: 06/23/23                                    Prior Living Arrangements/Services                       Activities of Daily Living   ADL Screening (condition at time of admission) Independently performs ADLs?: No Does the patient have a NEW difficulty with bathing/dressing/toileting/self-feeding that is expected to last >3 days?: No Does the patient have a NEW difficulty with getting in/out of bed, walking, or climbing stairs that is expected to last >3 days?: No Does the patient have a NEW difficulty with communication that is expected to last >3 days?: No Is the patient deaf  or have difficulty hearing?: No Does the patient have difficulty seeing, even when wearing glasses/contacts?: No Does the patient have difficulty concentrating, remembering, or making decisions?: No  Permission Sought/Granted                  Emotional Assessment              Admission diagnosis:  Acute gastroenteritis [K52.9] Gastroenteritis [K52.9] Bacteremia due to Clostridium species [R78.81, B96.89] Bacteremia [R78.81] Patient Active Problem List   Diagnosis Date Noted   Essential hypertension 06/23/2023   Cholelithiasis 06/23/2023   Acute gastroenteritis 06/22/2023   Syncope 06/15/2023   Endocarditis 06/15/2023   Bacteremia 06/11/2023   Bacteremia due to Clostridium species 06/11/2023   CKD stage 3b, GFR 30-44 ml/min (HCC) 06/11/2023   Paroxysmal atrial fibrillation (HCC) 06/10/2022   DOE (dyspnea on exertion) 12/31/2021   Palpitations 12/31/2021   Gastroenteritis 06/15/2021   Skin neoplasm 04/24/2021   Closed nondisplaced fracture of lateral malleolus of right fibula 11/26/2019   Status post hip hemiarthroplasty 10/12/2019   Closed displaced midcervical fracture of right femur (HCC) 10/09/2019   Secondary hyperparathyroidism of renal origin (  HCC) 06/30/2018   Insomnia 02/02/2018   COPD (chronic obstructive pulmonary disease) (HCC) 12/26/2017   Chronic renal disease, stage IV (HCC) 12/26/2017   Aortic atherosclerosis (HCC) 09/14/2017   CLL (chronic lymphocytic leukemia) (HCC) 09/14/2017   Chronic diastolic heart failure (HCC) 08/25/2017   Mesenteric ischemia (HCC) 09/16/2016   Advanced directives, counseling/discussion 01/15/2014   AAA (abdominal aortic aneurysm)    Routine general medical examination at a health care facility 07/07/2012   History of melanoma 12/24/2011   Peripheral arterial disease (HCC) 12/21/2010   History of squamous cell carcinoma of skin 09/28/2010   Chronic anemia 04/02/2010   Venous (peripheral) insufficiency 04/02/2010    Hyperlipidemia 06/23/2009   Episodic mood disorder (HCC) 06/23/2009   Essential hypertension, benign 06/23/2009   PCP:  Abdul Fine, MD Pharmacy:   [facility] - [address] [place] - [address] [place] [address] Phone: [phone] Fax: [phone]  [facility] [place], [place] - [address] [place] [address] Phone: [phone] Fax: [phone]  [facility] [address] [place] [address] Phone: [phone] Fax: [phone]     Social Drivers of Health (SDOH) Social History: SDOH Screenings   Food Insecurity: No Food Insecurity (06/23/2023)  Housing: Low Risk  (06/23/2023)  Transportation Needs: No Transportation Needs (06/23/2023)  Utilities: Not At Risk (06/23/2023)  Depression (PHQ2-9): Low Risk  (04/08/2023)  Social Connections: Moderately Isolated (06/23/2023)  Tobacco Use: High Risk (06/26/2023)   SDOH Interventions:     Readmission Risk Interventions     No data to display

## 2023-06-26 NOTE — Op Note (Signed)
 [facility] Gastroenterology Patient Name: Tiffany Martinez Procedure Date: 06/26/2023 7:34 AM MRN: [id number] Account #: [id number] Date of Birth: 1933/12/03 Admit Type: Inpatient Age: 89 Room: [facility] ENDO ROOM 4 Gender: Female Note Status: Finalized Instrument Name: Peds Colonoscope [id number] Procedure:             Colonoscopy Indications:           Hematochezia Providers:             Elspeth Ozell Jungling DO, DO Medicines:             Monitored Anesthesia Care Complications:         No immediate complications. Estimated blood loss:                         Minimal. Procedure:             Pre-Anesthesia Assessment:                        - Prior to the procedure, a History and Physical was                         performed, and patient medications and allergies were                         reviewed. The patient is competent. The risks and                         benefits of the procedure and the sedation options and                         risks were discussed with the patient. All questions                         were answered and informed consent was obtained.                         Patient identification and proposed procedure were                         verified by the physician, the nurse, the anesthetist                         and the technician in the endoscopy suite. Mental                         Status Examination: alert and oriented. Airway                         Examination: normal oropharyngeal airway and neck                         mobility. Respiratory Examination: clear to                         auscultation. CV Examination: regular rate and rhythm.                         Prophylactic Antibiotics: The patient does not require  prophylactic antibiotics. Prior Anticoagulants: The                         patient has taken Eliquis  (apixaban ), last dose was 4                         days prior to procedure. ASA Grade  Assessment: III - A                         patient with severe systemic disease. After reviewing                         the risks and benefits, the patient was deemed in                         satisfactory condition to undergo the procedure. The                         anesthesia plan was to use monitored anesthesia care                         (MAC). Immediately prior to administration of                         medications, the patient was re-assessed for adequacy                         to receive sedatives. The heart rate, respiratory                         rate, oxygen saturations, blood pressure, adequacy of                         pulmonary ventilation, and response to care were                         monitored throughout the procedure. The physical                         status of the patient was re-assessed after the                         procedure.                        After obtaining informed consent, the colonoscope was                         passed under direct vision. Throughout the procedure,                         the patient's blood pressure, pulse, and oxygen                         saturations were monitored continuously. The                         Colonoscope was introduced through the anus and  advanced to the the terminal ileum, with                         identification of the appendiceal orifice and IC                         valve. The colonoscopy was performed without                         difficulty. The patient tolerated the procedure well.                         The quality of the bowel preparation was evaluated                         using the BBPS [facility] Bowel Preparation Scale) with                         scores of: Right Colon = 2 (minor amount of residual                         staining, small fragments of stool and/or opaque                         liquid, but mucosa seen well), Transverse Colon = 2                          (minor amount of residual staining, small fragments of                         stool and/or opaque liquid, but mucosa seen well) and                         Left Colon = 2 (minor amount of residual staining,                         small fragments of stool and/or opaque liquid, but                         mucosa seen well). The total BBPS score equals 6. Fair                         Prep. The terminal ileum, ileocecal valve, appendiceal                         orifice, and rectum were photographed. Findings:      The perianal and digital rectal examinations were normal. Pertinent       negatives include normal sphincter tone.      The terminal ileum appeared normal. Estimated blood loss: none.      Retroflexion in the right colon was performed.      Multiple small-mouthed diverticula were found in the sigmoid colon.       Estimated blood loss: none.      Non-bleeding internal hemorrhoids were found during retroflexion. The       hemorrhoids were Grade I (internal hemorrhoids that do not prolapse).       Estimated blood loss:  none.      There was moderate spasm in the entire colon. Estimated blood loss: none.      A 10 to 12 mm polyp was found in the rectum. The polyp was pedunculated.       The polyp was removed with a hot snare. Resection and retrieval were       complete. Estimated blood loss was minimal.      Two sessile polyps were found in the rectum and ascending colon. The       polyps were 3 to 4 mm in size. These polyps were removed with a cold       snare. Resection and retrieval were complete. Estimated blood loss was       minimal. Estimated blood loss was minimal.      Six sessile polyps were found in the entire colon. The polyps were 1 to       2 mm in size. These polyps were removed with a cold biopsy forceps.       Resection and retrieval were complete. Estimated blood loss was minimal.      Liquid, adherent to wall stool was found in the entire colon,        interfering with visualization. Lavage of the area was performed using a       moderate amount, resulting in clearance with good visualization.      No large mass appreciated. Due to stool adherent to walls, cannot rule       out all small lesions.      The exam was otherwise without abnormality on direct and retroflexion       views. Impression:            - The examined portion of the ileum was normal.                        - Diverticulosis in the sigmoid colon.                        - Non-bleeding internal hemorrhoids.                        - Moderate colonic spasm.                        - One 10 to 12 mm polyp in the rectum, removed with a                         hot snare. Resected and retrieved.                        - Two 3 to 4 mm polyps in the rectum and in the                         ascending colon, removed with a cold snare. Resected                         and retrieved.                        - Six 1 to 2 mm polyps in the entire colon, removed  with a cold biopsy forceps. Resected and retrieved.                        - Stool in the entire examined colon.                        - The examination was otherwise normal on direct and                         retroflexion views. Recommendation:        - Patient has a contact number available for                         emergencies. The signs and symptoms of potential                         delayed complications were discussed with the patient.                         Return to normal activities tomorrow. Written                         discharge instructions were provided to the patient.                        - Return patient to hospital ward for ongoing care.                        - NPO.                        - Perform video capsule endoscopy. Can have clear                         liquids 2 hours after and solids 4 hours after capsule                         is ingested.                        -  Further recommendations pending video capsule                         endoscopy.                        - Continue present medications.                        - Continue once daily by mouth protonix  40 mg                        - No aspirin , ibuprofen, naproxen , or other                         non-steroidal anti-inflammatory drugs for 5 days after                         polyp removal.                        -  Resume Eliquis  (apixaban ) at prior dose in 2 days.                         Refer to managing physician for further adjustment of                         therapy.                        - Await pathology results.                        - The findings and recommendations were discussed with                         the patient.                        - The findings and recommendations were discussed with                         the referring physician. Procedure Code(s):     --- Professional ---                        [phone], Colonoscopy, flexible; with removal of                         tumor(s), polyp(s), or other lesion(s) by snare                         technique                        45380, 59, Colonoscopy, flexible; with biopsy, single                         or multiple Diagnosis Code(s):     --- Professional ---                        K64.0, First degree hemorrhoids                        K58.9, Irritable bowel syndrome without diarrhea                        D12.8, Benign neoplasm of rectum                        D12.2, Benign neoplasm of ascending colon                        K92.1, Melena (includes Hematochezia)                        K57.30, Diverticulosis of large intestine without                         perforation or abscess without bleeding CPT copyright 2022 American Medical Association. All rights reserved. The codes documented in this report are preliminary and upon coder review may  be revised to meet current compliance requirements. Attending  Participation:  I personally performed the entire procedure. Elspeth Jungling, DO Elspeth Ozell Jungling DO, DO 06/26/2023 9:18:37 AM This report has been signed electronically. Number of Addenda: 0 Note Initiated On: 06/26/2023 7:34 AM Scope Withdrawal Time: 0 hours 19 minutes 4 seconds  Total Procedure Duration: 0 hours 24 minutes 54 seconds  Estimated Blood Loss:  Estimated blood loss was minimal.      [facility]

## 2023-06-26 NOTE — Care Plan (Signed)
 Brief GI Care Plan  See op reports for details regarding procedures Video capsule to be administered Continue once a day protonix  40 mg po Hold eliquis  for 2 days post procedure Hold nsaids for 5 days  No obvious mass on bidirectional endoscopy  NPO for 2 hours post video capsule administration. Then clear liquids. Solids can resume 4 hours after capsule administration.  Further recommendations pending VCE and pathology.  Elspeth EMERSON Jungling, DO [facility] Gastroenterology

## 2023-06-26 NOTE — Anesthesia Procedure Notes (Signed)
 Date/Time: 06/26/2023 8:34 AM  Performed by: Tod Handing, CRNAPre-anesthesia Checklist: Patient identified, Emergency Drugs available, Suction available and Patient being monitored Patient Re-evaluated:Patient Re-evaluated prior to induction Oxygen Delivery Method: Nasal cannula Induction Type: IV induction Dental Injury: Teeth and Oropharynx as per pre-operative assessment  Comments: Nasal cannula with etCO2 monitoring

## 2023-06-26 NOTE — Evaluation (Signed)
 Physical Therapy Evaluation Patient Details Name: Tiffany Martinez MRN: [id number] DOB: 01-Jan-1934 Today's Date: 06/26/2023  History of Present Illness  Pt is an 89 y.o. female with medical history significant for anxiety, CHF, CLL, hypertension, dyslipidemia, migraine, PAD, paroxysmal atrial fibrillation, who presented to the emergency room with acute onset of recurrent nausea, vomiting with diarrhea.  MD assessment includes: acute gastroenteritis, cholelithiasis, and clostridium ramosum bacteremia.   Clinical Impression  Pt found in supine with BP taken at 167/61 with MD stating ok to work with pt with systolic BP <180.  Pt Ind with bed mobility tasks with very good speed and control including scooting up towards the [facility].  Pt was Mod Ind with transfers and gait with a RW with good stability throughout and with no adverse symptoms.  Pt self-limited amb distance secondary to lunch arriving and pt eager to eat.  Pt reported feeling at/close to baseline functionally and declined further PT services going forward.  Will complete PT orders at this time but will reassess pt pending a change in status upon receipt of new PT orders.         If plan is discharge home, recommend the following: Assist for transportation   Can travel by private vehicle        Equipment Recommendations None recommended by PT  Recommendations for Other Services       Functional Status Assessment Patient has not had a recent decline in their functional status     Precautions / Restrictions Precautions Precautions: None Restrictions Weight Bearing Restrictions Per Provider Order: No      Mobility  Bed Mobility Overal bed mobility: Independent             General bed mobility comments: Good speed and control with all bed mobility tasks    Transfers Overall transfer level: Modified independent Equipment used: Rolling walker (2 wheels)               General transfer comment: Good eccentric and  concentric control and stability    Ambulation/Gait Ambulation/Gait assistance: Modified independent (Device/Increase time) Gait Distance (Feet): 40 Feet Assistive device: Rolling walker (2 wheels) Gait Pattern/deviations: Step-through pattern, Decreased step length - right, Decreased step length - left Gait velocity: decreased     General Gait Details: Slow cadence but steady without LOB with distance limited by patient secondary to lunch arriving with pt eager to eat  Stairs            Wheelchair Mobility     Tilt Bed    Modified Rankin (Stroke Patients Only)       Balance Overall balance assessment: No apparent balance deficits (not formally assessed)                                           Pertinent Vitals/Pain Pain Assessment Pain Assessment: No/denies pain    Home Living Family/patient expects to be discharged to:: Private residence Living Arrangements: Alone Available Help at Discharge: Personal care attendant Type of Home: House Home Access: Level entry       Home Layout: One level Home Equipment: Rollator (4 wheels) Additional Comments: Pt lives at [facility] with PCA that assists with errands and housework    Prior Function Prior Level of Function : Independent/Modified Independent             Mobility Comments: Mod ind amb with  a rollator limited community distances, 1 fall in the last 6 months secondary to LOB ADLs Comments: Ind with ADLs     Extremity/Trunk Assessment   Upper Extremity Assessment Upper Extremity Assessment: Overall WFL for tasks assessed    Lower Extremity Assessment Lower Extremity Assessment: Overall WFL for tasks assessed       Communication   Communication Communication: No apparent difficulties Cueing Techniques: Verbal cues  Cognition Arousal: Alert Behavior During Therapy: WFL for tasks assessed/performed Overall Cognitive Status: Within Functional Limits for tasks assessed                                           General Comments      Exercises     Assessment/Plan    PT Assessment Patient does not need any further PT services  PT Problem List         PT Treatment Interventions      PT Goals (Current goals can be found in the Care Plan section)  Acute Rehab PT Goals PT Goal Formulation: All assessment and education complete, DC therapy    Frequency       Co-evaluation               AM-PAC PT 6 Clicks Mobility  Outcome Measure Help needed turning from your back to your side while in a flat bed without using bedrails?: None Help needed moving from lying on your back to sitting on the side of a flat bed without using bedrails?: None Help needed moving to and from a bed to a chair (including a wheelchair)?: None Help needed standing up from a chair using your arms (e.g., wheelchair or bedside chair)?: None Help needed to walk in hospital room?: None Help needed climbing 3-5 steps with a railing? : None 6 Click Score: 24    End of Session Equipment Utilized During Treatment: Gait belt Activity Tolerance: Patient tolerated treatment well Patient left: in bed;with call bell/phone within reach Nurse Communication: Mobility status PT Visit Diagnosis: Difficulty in walking, not elsewhere classified (R26.2)    Time: 1310-1335 PT Time Calculation (min) (ACUTE ONLY): 25 min   Charges:   PT Evaluation $PT Eval Low Complexity: 1 Low   PT General Charges $$ ACUTE PT VISIT: 1 Visit    D. Glendia Bertin PT, DPT 06/26/23, 2:56 PM

## 2023-06-26 NOTE — Plan of Care (Signed)

## 2023-06-26 NOTE — Progress Notes (Signed)
 Approximately 1100-- Pt returned to floor by PACU RN. Upon arrival, pt appeared very drowsy and required sternal rub to respond. [employer], [employer], and PACU, RN in room. VS obtained and stable. Pt able to open eyes occasionally and responded to voice and pain, but had minimal verbal responses. This RN previously received report from LaQuitta, [employer] and was told that pt was too drowsy to perform capsule study at this time, and that she would return to the floor later this afternoon.    MD Onita armin Mana made aware of pt's lethargy and difficulty to arouse which required sternal rub. This RN remained in room with pt for approximately 20 mins until mental status improved. After 20 mins, pt was easily arouseable and talking more with staff. VS frequently obtained during this time. Pt remains on telemetry monitoring.   06/26/23 1103  Assess: MEWS Score  Temp 98.1 F (36.7 C)  BP (!) 195/57 (MD made aware)  MAP (mmHg) 96  Pulse Rate (!) 58  Resp 15  Level of Consciousness Responds to Pain (MD made aware)  SpO2 100 %  O2 Device Nasal Cannula  Patient Activity (if Appropriate) In bed  O2 Flow Rate (L/min) 2 L/min  Assess: MEWS Score  MEWS Temp 0  MEWS Systolic 0  MEWS Pulse 0  MEWS RR 0  MEWS LOC 2  MEWS Score 2  MEWS Score Color Yellow  Assess: if the MEWS score is Yellow or Red  Were vital signs accurate and taken at a resting state? Yes  Does the patient meet 2 or more of the SIRS criteria? No  MEWS guidelines implemented  Yes, yellow  Treat  MEWS Interventions Considered administering scheduled or prn medications/treatments as ordered  Take Vital Signs  Increase Vital Sign Frequency  Yellow: Q2hr x1, continue Q4hrs until patient remains green for 12hrs  Escalate  MEWS: Escalate Yellow: Discuss with charge nurse and consider notifying provider and/or RRT  Notify: Charge Nurse/RN  Name of Charge Nurse/RN Notified Misty, RN  Provider Notification  Provider Name/Title Mana, RN  Date  Provider Notified 06/26/23  Time Provider Notified 1105  Method of Notification Page  Notification Reason Other (Comment) (Yellow MEWS)  Provider response At bedside  Date of Provider Response 06/26/23  Time of Provider Response 1110  Assess: SIRS CRITERIA  SIRS Temperature  0  SIRS Respirations  0  SIRS Pulse 0  SIRS WBC 1  SIRS Score Sum  1

## 2023-06-26 NOTE — Care Plan (Signed)
 Brief GI Update Note  Pt was seen in her room. She is still fairly sleepy. She does arouse to sternal rub with purposeful movement and then opens her eyes to her name. After a few minutes, she opened her eyes to shoulder touch and voice.  Her abdomen is soft to the touch and she denies any pain. She is no longer having an nausea/retching.  She is hypertensive, but in review of her vital signs, this has been consistent with the rest of her hospital course.  She appears to be slowly waking up from her anesthesia.   Her procedure course themselves did not require any additional maneuvers aside from biopsy and polyp resection.  If she is able to wake up fully and swallow, we can perform capsule study today. Otherwise we will go ahead and cancel this.  This was discussed with her nursing staff.  Elspeth EMERSON Jungling, DO [facility]

## 2023-06-26 NOTE — Transfer of Care (Signed)
 Immediate Anesthesia Transfer of Care Note  Patient: Tiffany Martinez  Procedure(s) Performed: Procedure(s): ESOPHAGOGASTRODUODENOSCOPY (EGD) WITH PROPOFOL  (N/A) COLONOSCOPY WITH PROPOFOL  (N/A) GIVENS CAPSULE STUDY (N/A) BIOPSY HOT HEMOSTASIS (ARGON PLASMA COAGULATION/BICAP)  Patient Location: PACU and Endoscopy Unit  Anesthesia Type:General  Level of Consciousness: sedated  Airway & Oxygen Therapy: Patient Spontanous Breathing and Patient connected to nasal cannula oxygen  Post-op Assessment: Report given to RN and Post -op Vital signs reviewed and stable  Post vital signs: Reviewed and stable  Last Vitals:  Vitals:   06/26/23 0921 06/26/23 0922  BP: (!) 181/52   Pulse: (!) 52   Resp: 18 18  Temp:  (!) 36.1 C  SpO2: 98%     Complications: No apparent anesthesia complications

## 2023-06-26 NOTE — Progress Notes (Signed)
 Approximately 1645--Pt discharged to home. At time of discharge, pt A&O x 4 and VSS. All PIVs removed with sites WDL. AVS discharge instructions provided to pt with teach-back technique utilized. All pt belongings taken with pt--pt verified. Pt transported to home via [facility] security.

## 2023-06-26 NOTE — Progress Notes (Signed)
 Progress Note: Per Endo, pt has been recovered, just still needs capsule study done, but pt remains too sleepy to swallow pill.  Report given to floor per LaQuitta, RN (endo). And she will return to the floor for capsule study later this afternoon. Transported pt through PACU for VS: 179/48, HR 51, sats 96% on RA.  Pt remains drowsy but responsive to pain and opens eyes. Returned to room 223.

## 2023-06-26 NOTE — Anesthesia Preprocedure Evaluation (Signed)
 Anesthesia Evaluation  Patient identified by MRN, date of birth, ID band Patient awake    Reviewed: Allergy & Precautions, H&P , NPO status , Patient's Chart, lab work & pertinent test results, reviewed documented beta blocker date and time   History of Anesthesia Complications Negative for: history of anesthetic complications  Airway Mallampati: II  TM Distance: >3 FB Neck ROM: full    Dental  (+) Dental Advidsory Given   Pulmonary shortness of breath and with exertion, neg sleep apnea, COPD, neg recent URI, Current Smoker and Patient abstained from smoking.   Pulmonary exam normal breath sounds clear to auscultation       Cardiovascular Exercise Tolerance: Good hypertension, (-) angina + Peripheral Vascular Disease and +CHF  (-) Past MI and (-) Cardiac Stents Normal cardiovascular exam(-) dysrhythmias (-) Valvular Problems/Murmurs Rhythm:regular Rate:Normal     Neuro/Psych  PSYCHIATRIC DISORDERS Anxiety     negative neurological ROS     GI/Hepatic negative GI ROS, Neg liver ROS,,,  Endo/Other  negative endocrine ROS    Renal/GU      Musculoskeletal   Abdominal   Peds  Hematology negative hematology ROS (+)   Anesthesia Other Findings Past Medical History: 2012: AAA (abdominal aortic aneurysm) (HCC)     Comment:  3.9cm, rec rpt 1 yr No date: Anemia No date: Anxiety No date: CHF (congestive heart failure) (HCC) No date: Chronic venous insufficiency 2014: CLL (chronic lymphocytic leukemia) (HCC)     Comment:  Stage 0--by flow cytometry No date: Hx of colonoscopy No date: Hyperlipidemia No date: Hypertension No date: Melanoma (HCC)     Comment:  Right Arm No date: Migraine headache     Comment:  3-4X/yr 2012: PAD (peripheral artery disease) (HCC)     Comment:  severe R common iliac stenosis, ABI 0.73, mid L common               iliac stenosis ABI 1.1 No date: PAD (peripheral artery disease) (HCC) No date:  Wears dentures     Comment:  full upper and lower   Reproductive/Obstetrics negative OB ROS                             Anesthesia Physical Anesthesia Plan  ASA: 3  Anesthesia Plan: General   Post-op Pain Management: Minimal or no pain anticipated   Induction: Intravenous  PONV Risk Score and Plan: 3 and Propofol  infusion, TIVA and Ondansetron   Airway Management Planned: Nasal Cannula  Additional Equipment: None  Intra-op Plan:   Post-operative Plan:   Informed Consent: I have reviewed the patients History and Physical, chart, labs and discussed the procedure including the risks, benefits and alternatives for the proposed anesthesia with the patient or authorized representative who has indicated his/her understanding and acceptance.     Dental advisory given  Plan Discussed with: CRNA and Surgeon  Anesthesia Plan Comments: (Discussed risks of anesthesia with patient, including possibility of difficulty with spontaneous ventilation under anesthesia necessitating airway intervention, PONV, and rare risks such as cardiac or respiratory or neurological events, and allergic reactions. Discussed the role of CRNA in patient's perioperative care. Patient understands.)       Anesthesia Quick Evaluation

## 2023-06-26 NOTE — Discharge Summary (Addendum)
 Physician Discharge Summary   Patient: Tiffany Martinez MRN: [id number] DOB: Nov 28, 1933  Admit date:     06/22/2023  Discharge date: 06/26/23  Discharge Physician: Murvin Mana   PCP: Abdul Fine, MD   Recommendations at discharge:   Follow-up with PCP in 1 week. Follow-up with GI in 2 to 4 weeks. PT and OT in [place].  Discharge Diagnoses: Principal Problem:   Acute gastroenteritis Active Problems:   Cholelithiasis   Chronic diastolic heart failure (HCC)   CLL (chronic lymphocytic leukemia) (HCC)   Paroxysmal atrial fibrillation (HCC)   Bacteremia   CKD stage 3b, GFR 30-44 ml/min (HCC)   Essential hypertension   Erosive gastritis   Colon polyps  Resolved Problems:   * No resolved hospital problems. *  Hospital Course: Tiffany Martinez is a 89 y.o. female with medical history significant for anxiety, CHF, CLL, hypertension, dyslipidemia, migraine, PAD, paroxysmal atrial fibrillation, who presented to the emergency room with acute onset of recurrent nausea, vomiting with diarrhea over the last day with associated abdominal cramps. was recently admitted for Clostridium bacteremia and continues antibiotic therapy with p.o. Augmentin . Discharged 1/30.  Repeat blood culture positive in all bottles.  Seen by ID, Unasyn  started.  EGD showed gastritis, colonoscopy removed several polyps.  No malignancy identified. Per ID, patient be treated with 2 weeks of Augmentin .  Assessment and Plan: Acute gastroenteritis Clostridium ramosum bacteremia-  Erosive gastritis. Patient has been evaluated by ID, negative for cancer with EGD/colonoscopy.  Continued Unasyn . Per ID, patient will be treated with Augmentin  for 2 weeks. Patient is also placed on PPI twice a day for 1 month and follow-up with GI.     Chronic kidney disease stage IIIb. Renal function still stable.     Cholelithiasis- not symptomatic. No signs of inflammation, obstruction   Essential hypertension Restarted  losartan  in addition to beta-blocker and amlodipine . Blood pressure still running high, added clonidine . Follow-up with PCP as outpatient.   Paroxysmal atrial fibrillation (HCC) Continue beta-blocker, restart Eliquis  on 2/11.   CLL (chronic lymphocytic leukemia) (HCC) Anemia and thrombocytopenia secondary to CLL. Follow-up with PCP as outpatient.       Consultants: GI Procedures performed: EGD and colonoscopy. Disposition: Home Diet recommendation:  Discharge Diet Orders (From admission, onward)     Start     Ordered   06/26/23 0000  Diet - low sodium heart healthy        06/26/23 1259           Cardiac diet DISCHARGE MEDICATION: Allergies as of 06/26/2023       Reactions   Triamterene  Hives        Medication List     STOP taking these medications    metoprolol  succinate 25 MG 24 hr tablet Commonly known as: TOPROL -XL       TAKE these medications    acetaminophen  325 MG tablet Commonly known as: TYLENOL  Take 2 tablets (650 mg total) by mouth every 6 (six) hours as needed for mild pain (pain score 1-3) (or Fever >/= 101).   amLODipine  5 MG tablet Commonly known as: NORVASC  Take 1 tablet (5 mg total) by mouth daily.   amoxicillin -clavulanate 500-125 MG tablet Commonly known as: Augmentin  Take 1 tablet by mouth 2 (two) times daily for 14 days.   apixaban  2.5 MG Tabs tablet Commonly known as: Eliquis  Take 1 tablet (2.5 mg total) by mouth 2 (two) times daily. Start taking on: June 28, 2023 What changed: These instructions start on June 28, 2023. If you are unsure what to do until then, ask your doctor or other care provider.   cloNIDine  0.1 mg/24hr patch Commonly known as: CATAPRES  - Dosed in mg/24 hr Place 1 patch (0.1 mg total) onto the skin every 7 (seven) days.   loratadine  10 MG tablet Commonly known as: CLARITIN  Take 10 mg by mouth daily.   losartan  100 MG tablet Commonly known as: COZAAR  Take 1 tablet (100 mg total) by mouth  daily.   MULTIVITAMIN ADULT PO Take 1 tablet by mouth daily.   pantoprazole  40 MG tablet Commonly known as: Protonix  Take 1 tablet (40 mg total) by mouth 2 (two) times daily.        Follow-up Information     Abdul Fine, MD. Schedule an appointment as soon as possible for a visit in 1 week(s).   Specialty: Family Medicine Contact information: [address] [place] [address] [phone]         Onita Elspeth Sharper, DO Follow up in 1 month(s).   Specialty: Gastroenterology Contact information: [address] [facility] [place] [address] [phone]                Discharge Exam: Tiffany Martinez   06/22/23 1904 06/26/23 0747  Weight: 62.1 kg 61.2 kg   General exam: Appears calm and comfortable  Respiratory system: Clear to auscultation. Respiratory effort normal. Cardiovascular system: S1 & S2 heard, RRR. No JVD, murmurs, rubs, gallops or clicks. No pedal edema. Gastrointestinal system: Abdomen is nondistended, soft and nontender. No organomegaly or masses felt. Normal bowel sounds heard. Central nervous system: Alert and oriented. No focal neurological deficits. Extremities: Symmetric 5 x 5 power. Skin: No rashes, lesions or ulcers Psychiatry: Judgement and insight appear normal. Mood & affect appropriate.    Condition at discharge: good  The results of significant diagnostics from this hospitalization (including imaging, microbiology, ancillary and laboratory) are listed below for reference.   Imaging Studies: US  ABDOMEN LIMITED RUQ (LIVER/GB) Result Date: 06/22/2023 CLINICAL DATA:  Cholecystitis.  Abdominal pain EXAM: ULTRASOUND ABDOMEN LIMITED RIGHT UPPER QUADRANT COMPARISON:  CT 06/22/2023 FINDINGS: Gallbladder: Multiple small stones layering in the gallbladder. No gallbladder wall thickening or edema. Murphy's sign is negative. Common bile duct: Diameter: 4 mm, normal Liver: No focal lesion identified. Within normal limits in  parenchymal echogenicity. Portal vein is patent on color Doppler imaging with normal direction of blood flow towards the liver. Other: Right renal atrophy. Increased echotexture consistent with medical renal disease. IMPRESSION: Cholelithiasis. No additional changes to suggest acute cholecystitis. Electronically Signed   By: Elsie Gravely M.D.   On: 06/22/2023 22:30   CT ABDOMEN PELVIS W CONTRAST Result Date: 06/22/2023 CLINICAL DATA:  Acute nonlocalized abdominal pain. Nausea, vomiting, and diarrhea. Dark stools. EXAM: CT ABDOMEN AND PELVIS WITH CONTRAST TECHNIQUE: Multidetector CT imaging of the abdomen and pelvis was performed using the standard protocol following bolus administration of intravenous contrast. RADIATION DOSE REDUCTION: This exam was performed according to the departmental dose-optimization program which includes automated exposure control, adjustment of the mA and/or kV according to patient size and/or use of iterative reconstruction technique. CONTRAST:  75mL OMNIPAQUE  IOHEXOL  300 MG/ML  SOLN COMPARISON:  PET-CT 06/15/2023. CT chest abdomen and pelvis 06/13/2023. FINDINGS: Lower chest: Linear scarring in the lung bases. Hepatobiliary: Layering stones or sludge in the gallbladder. No gallbladder wall thickening. Bile ducts are not dilated. No focal liver lesions. Pancreas: Unremarkable. No pancreatic ductal dilatation or surrounding inflammatory changes. Spleen: Normal in size without  focal abnormality. Adrenals/Urinary Tract: No adrenal gland nodules. Prominent right renal atrophy. No hydronephrosis or hydroureter. Bladder is normal. Bilateral renal cysts. Largest on the left measures 1.6 cm diameter. No imaging follow-up is indicated. Stomach/Bowel: Stomach, small bowel, and colon are not abnormally distended. Stool fills the colon. Colonic diverticulosis without evidence of acute diverticulitis. No wall thickening or inflammatory changes. Appendix is normal. Vascular/Lymphatic: Aortic  calcification. 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 Stent graft is patent. No significant lymphadenopathy. Reproductive: Large calcification posterior to the uterus likely representing calcified fibroid. No abnormal adnexal masses. Other: No free air or free fluid. Abdominal wall musculature appears intact. Musculoskeletal: Postoperative right hip hemiarthroplasty. Mild degenerative changes in the spine. IMPRESSION: 1. No acute process demonstrated in the abdomen or pelvis. No evidence of bowel obstruction or inflammation. 2. Chronic atrophy of the right kidney. 3. Layering stones or sludge in the gallbladder without evidence of acute cholecystitis. 4. Aortic atherosclerosis. Postoperative changes of aortoiliac stent graft. Electronically Signed   By: Elsie Gravely M.D.   On: 06/22/2023 20:12   NM PET Image Initial (PI) Whole Body (F-18 FDG) Addendum Date: 06/16/2023 ADDENDUM REPORT: 06/16/2023 13:35 ADDENDUM: Voice recognition error: Correction - Incidental CT findings section: Aortic bi-iliac stent graft. NO abnormal metabolic activity. Findings conveyed to Dr Riva 06/16/2023  at13:00. Electronically Signed   By: Jackquline Boxer M.D.   On: 06/16/2023 13:35   Result Date: 06/16/2023 CLINICAL DATA:  Initial treatment strategy for bacteremia on clear source. EXAM: NUCLEAR MEDICINE PET WHOLE BODY TECHNIQUE: 6.8 mCi F-18 FDG was injected intravenously. Full-ring PET imaging was performed from the head to foot after the radiotracer. CT data was obtained and used for attenuation correction and anatomic localization. Fasting blood glucose: 90 mg/dl COMPARISON:  CT 98/72/7974 FINDINGS: HEAD/NECK: No hypermetabolic activity in the scalp. No hypermetabolic cervical lymph nodes. Incidental CT findings: none CHEST: No hypermetabolic mediastinal or hilar nodes. No suspicious pulmonary nodules on the CT scan. Incidental CT findings: Coronary artery calcification and aortic  atherosclerotic calcification. No pneumonia. ABDOMEN/PELVIS: No abnormal hypermetabolic activity within the liver, pancreas, adrenal glands, or spleen. No hypermetabolic lymph nodes in the abdomen or pelvis. Incidental CT findings: Aortic bi-iliac stent graft. Abnormal metabolic activity. Large calcified leiomyoma posterior the uterus. SKELETON: No focal hypermetabolic activity to suggest skeletal metastasis. Incidental CT findings: none EXTREMITIES: No abnormal hypermetabolic activity in the lower extremities. Incidental CT findings: none IMPRESSION: 1. No source of infection identified by FDG PET-CT imaging. 2. No source of infection identified on the CT portion exam. Electronically Signed: By: Jackquline Boxer M.D. On: 06/15/2023 20:15   ECHO TEE Result Date: 06/15/2023    TRANSESOPHOGEAL ECHO REPORT   Patient Name:   Tiffany Martinez Date of Exam: 06/15/2023 Medical Rec #:  [id number]       Height:       61.0 in Accession #:    [id number]      Weight:       136.0 lb Date of Birth:  06-20-1933      BSA:          1.603 m Patient Age:    89 years        BP:           168/54 mmHg Patient Gender: F               HR:           65 bpm. Exam Location:  [facility] Procedure: Transesophageal Echo, Cardiac Doppler,  Color Doppler and Saline            Contrast Bubble Study Indications:     Bacteremia R78.81  History:         Patient has prior history of Echocardiogram examinations, most                  recent 06/12/2023. CHF; Risk Factors:Dyslipidemia and                  Hypertension.  Sonographer:     Christopher Furnace Referring Phys:  [id number] EVALENE JINNY LUNGER Diagnosing Phys: Evalene Lunger MD PROCEDURE: After discussion of the risks and benefits of a TEE, an informed consent was obtained from the patient. TEE procedure time was 30 minutes. The transesophogeal probe was passed without difficulty through the esophogus of the patient. Imaged were obtained with the patient in a left lateral decubitus position. Local oropharyngeal  anesthetic was provided with Cetacaine  and viscous lidocaine . Sedation performed by different physician. Image quality was excellent. The patient's vital signs; including heart rate, blood pressure, and oxygen saturation; remained stable throughout the procedure. The patient developed no complications during the procedure.  IMPRESSIONS  1. Left ventricular ejection fraction, by estimation, is 55 to 60%. The left ventricle has normal function. The left ventricle has no regional wall motion abnormalities.  2. Right ventricular systolic function is normal. The right ventricular size is normal.  3. No left atrial/left atrial appendage thrombus was detected.  4. The mitral valve is normal in structure. Mild to moderate mitral valve regurgitation. No evidence of mitral stenosis.  5. The aortic valve is normal in structure. Aortic valve regurgitation is mild. No aortic stenosis is present.  6. The inferior vena cava is normal in size with greater than 50% respiratory variability, suggesting right atrial pressure of 3 mmHg.  7. Agitated saline contrast bubble study was negative, with no evidence of any interatrial shunt.  8. No valve vegetation noted Conclusion(s)/Recommendation(s): Normal biventricular function without evidence of hemodynamically significant valvular heart disease. FINDINGS  Left Ventricle: Left ventricular ejection fraction, by estimation, is 55 to 60%. The left ventricle has normal function. The left ventricle has no regional wall motion abnormalities. The left ventricular internal cavity size was normal in size. There is  no left ventricular hypertrophy. Right Ventricle: The right ventricular size is normal. No increase in right ventricular wall thickness. Right ventricular systolic function is normal. Left Atrium: Left atrial size was normal in size. No left atrial/left atrial appendage thrombus was detected. Right Atrium: Right atrial size was normal in size. Pericardium: There is no evidence of  pericardial effusion. Mitral Valve: The mitral valve is normal in structure. Mild to moderate mitral valve regurgitation. No evidence of mitral valve stenosis. There is no evidence of mitral valve vegetation. Tricuspid Valve: The tricuspid valve is normal in structure. Tricuspid valve regurgitation is not demonstrated. No evidence of tricuspid stenosis. There is no evidence of tricuspid valve vegetation. Aortic Valve: The aortic valve is normal in structure. Aortic valve regurgitation is mild. No aortic stenosis is present. There is no evidence of aortic valve vegetation. Pulmonic Valve: The pulmonic valve was normal in structure. Pulmonic valve regurgitation is not visualized. No evidence of pulmonic stenosis. There is no evidence of pulmonic valve vegetation. Aorta: The aortic root is normal in size and structure. There is minimal (Grade I) atheroma plaque involving the aortic arch and descending aorta. Venous: The inferior vena cava is normal in size with greater than 50% respiratory  variability, suggesting right atrial pressure of 3 mmHg. IAS/Shunts: No atrial level shunt detected by color flow Doppler. Agitated saline contrast was given intravenously to evaluate for intracardiac shunting. Agitated saline contrast bubble study was negative, with no evidence of any interatrial shunt. There  is no evidence of a patent foramen ovale. There is no evidence of an atrial septal defect. Timothy Gollan MD Electronically signed by Evalene Lunger MD Signature Date/Time: 06/15/2023/9:27:03 PM    Final    CT Angio Chest/Abd/Pel for Dissection W and/or W/WO Result Date: 06/13/2023 CLINICAL DATA:  Aortic aneurysm suspected.  Aortic graft infection. EXAM: CT ANGIOGRAPHY CHEST, ABDOMEN AND PELVIS TECHNIQUE: Non-contrast CT of the chest was initially obtained. Multidetector CT imaging through the chest, abdomen and pelvis was performed using the standard protocol during bolus administration of intravenous contrast. Multiplanar  reconstructed images and MIPs were obtained and reviewed to evaluate the vascular anatomy. RADIATION DOSE REDUCTION: This exam was performed according to the departmental dose-optimization program which includes automated exposure control, adjustment of the mA and/or kV according to patient size and/or use of iterative reconstruction technique. CONTRAST:  75mL OMNIPAQUE  IOHEXOL  350 MG/ML SOLN COMPARISON:  CT of the abdomen pelvis dated 01/10/2022. FINDINGS: Evaluation is limited due to streak artifact caused by right hip arthroplasty. CTA CHEST FINDINGS Cardiovascular: There is no cardiomegaly or pericardial effusion. Three-vessel coronary vascular calcification and calcification of the mitral annulus. Advanced atherosclerotic calcification of the thoracic aorta. No aneurysmal dilatation or dissection. The origins of the great vessels of the aortic arch appear patent. No pulmonary artery embolus identified. Mediastinum/Nodes: No hilar or mediastinal adenopathy. The esophagus is grossly unremarkable. No mediastinal fluid collection. Lungs/Pleura: Bibasilar linear atelectasis/scarring. No focal consolidation, pleural effusion, or pneumothorax. The central airways are patent. Musculoskeletal: No acute osseous pathology. Review of the MIP images confirms the above findings. CTA ABDOMEN AND PELVIS FINDINGS VASCULAR Aorta: Advanced aortoiliac atherosclerotic disease. Status post prior aorto bi iliac endovascular stent graft repair of infrarenal abdominal aortic aneurysm. The stent graft appears patent. No contrast noted outside of the confines of the stent graft. No periaortic fluid collection. No significant interval change in the size of the excluded aneurysm sac. 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 The celiac artery remains patent. SMA: Advanced atherosclerotic calcification and high-grade stenosis of the origin of the SMA. The SMA appears patent.  Renals: There is atherosclerotic calcification of the origins of the renal arteries. The left renal artery is widely patent. There is narrowing of the right renal artery with diminished flow likely secondary to high-grade stenosis at the origin. This is similar to prior CT. IMA: The IMA is not visualized and appears occluded. Inflow: Advanced atherosclerotic calcification of the iliac arteries. No aneurysmal dilatation or dissection. The external iliac arteries appear patent. There is diminished flow in the internal iliac arteries. Veins: No obvious venous abnormality within the limitations of this arterial phase study. Review of the MIP images confirms the above findings. NON-VASCULAR No intra-abdominal free air or free fluid. Hepatobiliary: The liver is unremarkable. No biliary dilatation. Layering small stones in the gallbladder. No pericholecystic fluid or evidence of acute cholecystitis by CT. Pancreas: Unremarkable. No pancreatic ductal dilatation or surrounding inflammatory changes. Spleen: Normal in size without focal abnormality. Adrenals/Urinary Tract: The adrenal glands unremarkable. Moderate to severe right renal parenchyma atrophy. Multiple left renal cysts and smaller hypodense lesions which are too small to characterize. There is no hydronephrosis. The visualized ureters and urinary bladder appear unremarkable.  Stomach/Bowel: There is sigmoid diverticulosis. No bowel obstruction or active inflammation. The appendix is normal. Lymphatic: No adenopathy. Reproductive: Calcified uterine fibroid. A 4.3 x 3.0 cm ovoid lesion in the right hemipelvis is not characterized but likely an exophytic and partially calcified fibroid. Ultrasound may provide better evaluation of the pelvic structures. This is however similar to prior CT. Other: None Musculoskeletal: Total right hip arthroplasty. Osteopenia with degenerative changes of the spine. No acute osseous pathology. Review of the MIP images confirms the above  findings. IMPRESSION: 1. No acute intrathoracic, abdominal, or pelvic pathology. 2. Status post prior aorto bi iliac endovascular stent graft repair of infrarenal abdominal aortic aneurysm. The stent graft appears patent. No periaortic fluid collection. No significant interval change in the size of the excluded aneurysm sac. 3. Sigmoid diverticulosis. No bowel obstruction. Normal appendix. 4. Cholelithiasis. 5. Moderate to severe right renal parenchyma atrophy. 6.  Aortic Atherosclerosis (ICD10-I70.0). Electronically Signed   By: Vanetta Chou M.D.   On: 06/13/2023 16:36   ECHOCARDIOGRAM COMPLETE Result Date: 06/12/2023    ECHOCARDIOGRAM REPORT   Patient Name:   Tiffany Martinez Date of Exam: 06/12/2023 Medical Rec #:  [id number]       Height:       61.0 in Accession #:    [id number]      Weight:       136.0 lb Date of Birth:  [birth year]      BSA:          1.603 m Patient Age:    89 years        BP:           170/57 mmHg Patient Gender: F               HR:           63 bpm. Exam Location:  [facility] Procedure: 2D Echo, Color Doppler and Cardiac Doppler Indications:     Bacteremia R78.81  History:         Patient has prior history of Echocardiogram examinations. CHF,                  Arrythmias:Atrial Fibrillation; Risk Factors:Hypertension.  Sonographer:     Bari Roar Referring Phys:  [id number] AMY N COX Diagnosing Phys: Sabina Custovic IMPRESSIONS  1. Left ventricular ejection fraction, by estimation, is 50 to 55%. Left ventricular ejection fraction by 2D MOD biplane is 53.8 %. The left ventricle has low normal function. The left ventricle has no regional wall motion abnormalities. There is mild left ventricular hypertrophy. Left ventricular diastolic parameters are consistent with Grade II diastolic dysfunction (pseudonormalization).  2. Right ventricular systolic function is normal. The right ventricular size is normal.  3. Left atrial size was moderately dilated.  4. The mitral valve is normal in structure. Mild  mitral valve regurgitation. No evidence of mitral stenosis.  5. The aortic valve is normal in structure. Aortic valve regurgitation is moderate. No aortic stenosis is present. Aortic regurgitation PHT measures 453 msec.  6. The inferior vena cava is normal in size with greater than 50% respiratory variability, suggesting right atrial pressure of 3 mmHg. FINDINGS  Left Ventricle: Left ventricular ejection fraction, by estimation, is 50 to 55%. Left ventricular ejection fraction by 2D MOD biplane is 53.8 %. The left ventricle has low normal function. The left ventricle has no regional wall motion abnormalities. The left ventricular internal cavity size was normal in size. There is mild left ventricular hypertrophy. Left ventricular diastolic parameters are  consistent with Grade II diastolic dysfunction (pseudonormalization). Right Ventricle: The right ventricular size is normal. No increase in right ventricular wall thickness. Right ventricular systolic function is normal. Left Atrium: Left atrial size was moderately dilated. Right Atrium: Right atrial size was normal in size. Pericardium: There is no evidence of pericardial effusion. Mitral Valve: The mitral valve is normal in structure. Mild mitral valve regurgitation. No evidence of mitral valve stenosis. MV peak gradient, 8.5 mmHg. The mean mitral valve gradient is 4.0 mmHg. Tricuspid Valve: The tricuspid valve is normal in structure. Tricuspid valve regurgitation is mild. Aortic Valve: The aortic valve is normal in structure. Aortic valve regurgitation is moderate. Aortic regurgitation PHT measures 453 msec. No aortic stenosis is present. Pulmonic Valve: The pulmonic valve was normal in structure. Pulmonic valve regurgitation is not visualized. Aorta: The aortic root is normal in size and structure. Venous: The inferior vena cava is normal in size with greater than 50% respiratory variability, suggesting right atrial pressure of 3 mmHg. IAS/Shunts: No atrial  level shunt detected by color flow Doppler.  LEFT VENTRICLE PLAX 2D                        Biplane EF (MOD) LVIDd:         4.90 cm         LV Biplane EF:   Left LVIDs:         3.60 cm                          ventricular LV PW:         1.00 cm                          ejection LV IVS:        1.20 cm                          fraction by LVOT diam:     1.70 cm                          2D MOD LV SV:         62                               biplane is LV SV Index:   39                               53.8 %. LVOT Area:     2.27 cm                                Diastology                                LV e' medial:    5.18 cm/s LV Volumes (MOD)               LV E/e' medial:  27.8 LV vol d, MOD    84.6 ml       LV e' lateral:   6.90 cm/s A2C:  LV E/e' lateral: 20.9 LV vol d, MOD    83.8 ml A4C: LV vol s, MOD    36.7 ml A2C: LV vol s, MOD    39.0 ml A4C: LV SV MOD A2C:   47.9 ml LV SV MOD A4C:   83.8 ml LV SV MOD BP:    46.1 ml RIGHT VENTRICLE RV Basal diam:  3.60 cm RV Mid diam:    2.70 cm RV S prime:     11.60 cm/s TAPSE (M-mode): 2.6 cm LEFT ATRIUM             Index        RIGHT ATRIUM           Index LA diam:        3.80 cm 2.37 cm/m   RA Area:     18.80 cm LA Vol (A2C):   77.6 ml 48.40 ml/m  RA Volume:   50.70 ml  31.62 ml/m LA Vol (A4C):   66.2 ml 41.29 ml/m LA Biplane Vol: 72.2 ml 45.03 ml/m  AORTIC VALVE             PULMONIC VALVE LVOT Vmax:   123.00 cm/s PV Vmax:          1.02 m/s LVOT Vmean:  78.600 cm/s PV Peak grad:     4.2 mmHg LVOT VTI:    0.274 m     PR End Diast Vel: 5.48 msec AI PHT:      453 msec    RVOT Peak grad:   2 mmHg  AORTA Ao Root diam: 2.60 cm Ao Asc diam:  3.10 cm MITRAL VALVE                TRICUSPID VALVE MV Area (PHT): 3.65 cm     TR Peak grad:   34.6 mmHg MV Area VTI:   1.46 cm     TR Vmax:        294.00 cm/s MV Peak grad:  8.5 mmHg MV Mean grad:  4.0 mmHg     SHUNTS MV Vmax:       1.46 m/s     Systemic VTI:  0.27 m MV Vmean:      90.3 cm/s    Systemic Diam:  1.70 cm MV Decel Time: 208 msec MV E velocity: 144.00 cm/s MV A velocity: 127.00 cm/s MV E/A ratio:  1.13 MV A Prime:    7.0 cm/s Designer, [employer]imedia signed by Annalee Casa Signature Date/Time: 06/12/2023/12:34:53 PM    Final    DG Chest 2 View Result Date: 06/11/2023 CLINICAL DATA:  Leukocytosis and bacteremia. EXAM: CHEST - 2 VIEW COMPARISON:  None Available. FINDINGS: Stable mild cardiomegaly. Aortic atherosclerotic calcification incidentally noted. Both lungs are clear. The visualized skeletal structures are unremarkable. IMPRESSION: Mild cardiomegaly.  No active lung disease. Electronically Signed   By: Norleen DELENA Kil M.D.   On: 06/11/2023 15:56   DG Chest 2 View Result Date: 06/10/2023 CLINICAL DATA:  Loss of consciousness and bradycardia EXAM: CHEST - 2 VIEW COMPARISON:  Chest radiograph dated 01/10/2022 FINDINGS: Normal lung volumes. No focal consolidations. No pleural effusion or pneumothorax. The heart size and mediastinal contours are within normal limits. No acute osseous abnormality. IMPRESSION: No active cardiopulmonary disease. Electronically Signed   By: Limin  Xu M.D.   On: 06/10/2023 16:15    Microbiology: Results for orders placed or performed during the hospital encounter of 06/22/23  Culture, blood (routine x 2)     Status: Abnormal  Collection Time: 06/22/23 10:42 PM   Specimen: BLOOD  Result Value Ref Range Status   Specimen Description   Final    BLOOD BLOOD LEFT HAND Performed at [facility] [place], [address], [place], [place] [address]    Special Requests   Final    BOTTLES DRAWN AEROBIC AND ANAEROBIC Blood Culture adequate volume Performed at [facility], [address] [place], [place] [address]    Culture  Setup Time   Final    GRAM POSITIVE RODS AEROBIC BOTTLE ONLY CRITICAL RESULT CALLED TO, READ BACK BY AND VERIFIED WITH: TREY, G. [id number] 06/23/2023 LRL GRAM STAIN REVIEWED-AGREE WITH RESULT DRT    Culture (A)  Final     BACILLUS SPECIES Standardized susceptibility testing for this organism is not available. Performed at [facility], [address] [place], [place] [address]    Report Status 06/24/2023 FINAL  Final  Culture, blood (routine x 2)     Status: None (Preliminary result)   Collection Time: 06/22/23 10:42 PM   Specimen: BLOOD  Result Value Ref Range Status   Specimen Description BLOOD BLOOD RIGHT HAND  Final   Special Requests   Final    BOTTLES DRAWN AEROBIC AND ANAEROBIC Blood Culture adequate volume   Culture   Final    NO GROWTH 4 DAYS Performed at [facility], [address] [place], [place] [address]    Report Status PENDING  Incomplete    Labs: CBC: Recent Labs  Lab 06/22/23 1906 06/23/23 0554 06/25/23 0503 06/26/23 0452  WBC 29.3* 16.6* 16.0* 14.2*  NEUTROABS 6.0  --   --   --   HGB 10.3* 8.7* 9.5* 9.7*  HCT 28.8* 23.8* 27.2* 28.3*  MCV 112.1* 110.7* 101.9* 104.0*  PLT 146* 113* 113* 105*   Basic Metabolic Panel: Recent Labs  Lab 06/22/23 1906 06/23/23 0554 06/25/23 0503 06/26/23 0452  NA 141 140 139 138  K 4.5 3.7 3.6 3.6  CL 110 111 108 109  CO2 22 22 23  21*  GLUCOSE 139* 96 86 87  BUN 27* 23 20 17   CREATININE 1.63* 1.40* 1.47* 1.39*  CALCIUM  9.5 8.9 9.2 9.3   Liver Function Tests: Recent Labs  Lab 06/22/23 1906  AST 24  ALT 17  ALKPHOS 56  BILITOT 0.6  PROT 6.0*  ALBUMIN 3.6   CBG: No results for input(s): GLUCAP in the last 168 hours.  Discharge time spent: greater than 30 minutes.  Signed: Murvin Mana, MD [facility] 06/26/2023

## 2023-06-26 NOTE — Op Note (Signed)
 [facility] Patient Name: Tiffany Martinez Procedure Date: 06/26/2023 7:34 AM MRN: [id number] Account #: [id number] Date of Birth: Jul 04, 1933 Admit Type: Outpatient Age: 89 Room: [facility] ENDO ROOM 4 Gender: Female Note Status: Finalized Instrument Name: Upper Endoscope [id number] Procedure:             Upper GI endoscopy Indications:           Acute post hemorrhagic anemia Providers:             Elspeth Ozell Jungling DO, DO Medicines:             Monitored Anesthesia Care Complications:         No immediate complications. Estimated blood loss:                         Minimal. Procedure:             Pre-Anesthesia Assessment:                        - Prior to the procedure, a History and Physical was                         performed, and patient medications and allergies were                         reviewed. The patient is competent. The risks and                         benefits of the procedure and the sedation options and                         risks were discussed with the patient. All questions                         were answered and informed consent was obtained.                         Patient identification and proposed procedure were                         verified by the physician, the nurse, the anesthetist                         and the technician in the endoscopy suite. Mental                         Status Examination: alert and oriented. Airway                         Examination: normal oropharyngeal airway and neck                         mobility. Respiratory Examination: clear to                         auscultation. CV Examination: regular rate and rhythm.                         Prophylactic Antibiotics: The patient does not require  prophylactic antibiotics. Prior Anticoagulants: The                         patient has taken Eliquis  (apixaban ), last dose was 4                         days prior to  procedure. ASA Grade Assessment: III - A                         patient with severe systemic disease. After reviewing                         the risks and benefits, the patient was deemed in                         satisfactory condition to undergo the procedure. The                         anesthesia plan was to use monitored anesthesia care                         (MAC). Immediately prior to administration of                         medications, the patient was re-assessed for adequacy                         to receive sedatives. The heart rate, respiratory                         rate, oxygen saturations, blood pressure, adequacy of                         pulmonary ventilation, and response to care were                         monitored throughout the procedure. The physical                         status of the patient was re-assessed after the                         procedure.                        After obtaining informed consent, the endoscope was                         passed under direct vision. Throughout the procedure,                         the patient's blood pressure, pulse, and oxygen                         saturations were monitored continuously. The Endoscope                         was introduced through the mouth, and advanced to the  third part of duodenum. The upper GI endoscopy was                         accomplished without difficulty. The patient tolerated                         the procedure well. Findings:      Localized moderate inflammation characterized by erosions, erythema and       granularity was found in the duodenal bulb. Biopsies were taken with a       cold forceps for histology. Estimated blood loss was minimal.      The exam of the duodenum was otherwise normal.      Multiple localized small erosions with no bleeding and no stigmata of       recent bleeding were found on the greater curvature of the stomach and        in the gastric antrum. Biopsies were taken with a cold forceps for       histology. Estimated blood loss was minimal.      The exam of the stomach was otherwise normal.      Esophagogastric landmarks were identified: the gastroesophageal junction       was found at 39 cm from the incisors.      The Z-line was variable. <1cm Estimated blood loss: none.      The exam of the esophagus was otherwise normal. Impression:            - Duodenitis. Biopsied.                        - Erosive gastropathy with no bleeding and no stigmata                         of recent bleeding. Biopsied.                        - Esophagogastric landmarks identified.                        - Z-line variable. Recommendation:        - Patient has a contact number available for                         emergencies. The signs and symptoms of potential                         delayed complications were discussed with the patient.                         Return to normal activities tomorrow. Written                         discharge instructions were provided to the patient.                        - Discharge patient to home.                        - Resume previous diet.                        -  Continue present medications.                        - Await pathology results.                        - proceed with colonoscopy. see report for further                         recommendations.                        - The findings and recommendations were discussed with                         the patient. Procedure Code(s):     --- Professional ---                        [phone], Esophagogastroduodenoscopy, flexible,                         transoral; with biopsy, single or multiple Diagnosis Code(s):     --- Professional ---                        K29.80, Duodenitis without bleeding                        K31.89, Other diseases of stomach and duodenum                        K22.89, Other specified disease of esophagus                         D62, Acute posthemorrhagic anemia CPT copyright 2022 [employer]. All rights reserved. The codes documented in this report are preliminary and upon coder review may  be revised to meet current compliance requirements. Attending Participation:      I personally performed the entire procedure. Elspeth Jungling, DO Elspeth Ozell Jungling DO, DO 06/26/2023 8:41:02 AM This report has been signed electronically. Number of Addenda: 0 Note Initiated On: 06/26/2023 7:34 AM Estimated Blood Loss:  Estimated blood loss was minimal.      [facility]

## 2023-06-27 ENCOUNTER — Telehealth: Payer: Self-pay

## 2023-06-27 ENCOUNTER — Other Ambulatory Visit: Payer: Self-pay

## 2023-06-27 LAB — CULTURE, BLOOD (ROUTINE X 2)
Culture: NO GROWTH
Special Requests: ADEQUATE

## 2023-06-27 NOTE — Transitions of Care (Post Inpatient/ED Visit) (Signed)
   06/27/2023  Name: SAKARI HAVRILLA MRN: [id number] DOB: April 09, 1934  Today's TOC FU Call Status: Today's TOC FU Call Status:: Unsuccessful Call (1st Attempt) Unsuccessful Call (1st Attempt) Date: 06/27/23  Attempted to reach the patient regarding the most recent Inpatient/ED visit.Patient was called in an Outreach attempt to offer VBCI  30-day TOC program.Pt is eligible for program due to potential risk for readmission and/or high utilization. Unfortunately, I was not able to speak with the patient in regards to recent hospital discharge   Follow Up Plan: Additional outreach attempts will be made to reach the patient to complete the Transitions of Care (Post Inpatient/ED visit) call.   Irineo Manns RN, BSN, CCM [place]  [facility] Manager Population Health Direct Dial: [phone]  Fax: [phone]

## 2023-06-28 LAB — SURGICAL PATHOLOGY

## 2023-07-01 ENCOUNTER — Encounter: Payer: Self-pay | Admitting: Student

## 2023-07-01 ENCOUNTER — Ambulatory Visit (INDEPENDENT_AMBULATORY_CARE_PROVIDER_SITE_OTHER): Payer: Medicare Other | Admitting: Student

## 2023-07-01 VITALS — BP 162/78 | HR 76 | Temp 97.6°F | Ht 64.0 in | Wt 136.0 lb

## 2023-07-01 DIAGNOSIS — J449 Chronic obstructive pulmonary disease, unspecified: Secondary | ICD-10-CM | POA: Diagnosis not present

## 2023-07-01 DIAGNOSIS — K219 Gastro-esophageal reflux disease without esophagitis: Secondary | ICD-10-CM

## 2023-07-01 DIAGNOSIS — I5032 Chronic diastolic (congestive) heart failure: Secondary | ICD-10-CM

## 2023-07-01 DIAGNOSIS — I159 Secondary hypertension, unspecified: Secondary | ICD-10-CM | POA: Diagnosis not present

## 2023-07-01 DIAGNOSIS — R7881 Bacteremia: Secondary | ICD-10-CM

## 2023-07-01 DIAGNOSIS — F03B0 Unspecified dementia, moderate, without behavioral disturbance, psychotic disturbance, mood disturbance, and anxiety: Secondary | ICD-10-CM | POA: Diagnosis not present

## 2023-07-01 DIAGNOSIS — I480 Paroxysmal atrial fibrillation: Secondary | ICD-10-CM

## 2023-07-01 DIAGNOSIS — B9689 Other specified bacterial agents as the cause of diseases classified elsewhere: Secondary | ICD-10-CM

## 2023-07-01 DIAGNOSIS — C9110 Chronic lymphocytic leukemia of B-cell type not having achieved remission: Secondary | ICD-10-CM | POA: Diagnosis not present

## 2023-07-01 LAB — MISC LABCORP TEST (SEND OUT): Labcorp test code: 9985

## 2023-07-01 NOTE — Progress Notes (Signed)
Location:  [facility] Place of Service:  [facility] Provider: Sydnee Cabal  Code Status: DNR Goals of Care:     06/26/2023    7:49 AM  Advanced Directives  Does Patient Have a Medical Advance Directive? No     Chief Complaint  Patient presents with   Transitions Of Care    TOC. Discharged 06/26/2023    HPI: Patient is a 89 y.o. female seen today for hospital follow-up s/p admission from  Discussed the use of AI scribe software for clinical note transcription with the patient, who gave verbal consent to proceed.  History of Present Illness   Tiffany Martinez is an 89 year old female with chronic lymphocytic leukemia who presents with a history of clostridium bacteremia and recent hospitalizations.  She has a recent history of clostridium bacteremia, initially presenting with vomiting and loss of consciousness at a hairdresser, leading to an emergency room visit. Blood tests revealed a blood infection, resulting in multiple hospitalizations aimed at identifying the infection source. Extensive testing, including a colonoscopy and an attempted endoscopy, was performed, though the endoscopy was not completed due to anesthesia complications. During her second hospitalization from February 5th to 9th, she was diagnosed with clostridium bacteremia and discharged with a prescription for Augmentin.  She has a longstanding history of chronic lymphocytic leukemia for approximately "fifty years," per the patient with consistently elevated white blood cell counts. Following her recent infection, her leukemia was noted to have increased, prompting further evaluation by a cancer specialist. She is scheduled for follow-up appointments to monitor her condition and discuss lab results.  She experienced black stools and diarrhea during her initial hospitalization, raising concerns about potential colon cancer. However, a colonoscopy ruled out cancer. The clostridium bacteremia and gastrointestinal symptoms  were significant concerns during her hospital stay.  She reports confusion regarding her medication schedule but has recently started a new pill pack regimen, which includes Augmentin, amlodipine, Eliquis, loratadine, losartan, Protonix, and a multivitamin. Eliquis was temporarily stopped for five days during her hospital stay for the colonoscopy.  She has been experiencing elevated blood pressure readings, often around 180/85, which she attributes to her hospital environment. She is on losartan for blood pressure management.  She feels mentally clear and is actively managing her health and appointments. She has a history of nursing her husband through dementia, which has made her particularly sensitive to discussions about cognitive decline.      She recently started with clonidine patch which she will apply every 7 days.   Past Medical History:  Diagnosis Date   AAA (abdominal aortic aneurysm) (HCC) 2012   3.9cm 2012; 2.8cm 12/2021   Anemia    Anxiety    CHF (congestive heart failure) (HCC)    LVEF 60-65% in 2023   Chronic venous insufficiency    CLL (chronic lymphocytic leukemia) (HCC) 2014   Stage 0--by flow cytometry   Hx of colonoscopy    Hyperlipidemia    Hypertension    Melanoma (HCC)    Right Arm   Migraine headache    3-4X/yr   PAD (peripheral artery disease) (HCC) 2012   severe R common iliac stenosis, ABI 0.73, mid L common iliac stenosis ABI 1.1   PAD (peripheral artery disease) (HCC)    Paroxysmal atrial fibrillation (HCC) 12/2021   Wears dentures    full upper and lower    Past Surgical History:  Procedure Laterality Date   ABDOMINAL AORTIC ANEURYSM REPAIR  02/16/12   [facility]--Dr  Farber   CARDIOVASCULAR STRESS TEST  2008   negative   CATARACT EXTRACTION W/PHACO Left 07/16/2019   Procedure: CATARACT EXTRACTION PHACO AND INTRAOCULAR LENS PLACEMENT (IOC) LEFT 8.26  00:46.6;  Surgeon: Nevada Crane, MD;  Location: [facility];  Service: Ophthalmology;   Laterality: Left;   DOBUTAMINE STRESS ECHO  11/11   negative   HIP ARTHROPLASTY Right 10/10/2019   Procedure: ARTHROPLASTY BIPOLAR HIP (HEMIARTHROPLASTY);  Surgeon: Christena Flake, MD;  Location: [facility];  Service: Orthopedics;  Laterality: Right;   KNEE ARTHROSCOPY  1998   right   MELANOMA EXCISION  11/09/10   wide excision right upper arm   SQUAMOUS CELL CARCINOMA EXCISION  02/2016   TEE WITHOUT CARDIOVERSION N/A 06/15/2023   Procedure: TRANSESOPHAGEAL ECHOCARDIOGRAM (TEE);  Surgeon: Antonieta Iba, MD;  Location: [facility];  Service: Cardiovascular;  Laterality: N/A;   TONSILLECTOMY AND ADENOIDECTOMY  1960    Allergies  Allergen Reactions   Triamterene Hives    Outpatient Encounter Medications as of 07/01/2023  Medication Sig   acetaminophen (TYLENOL) 325 MG tablet Take 2 tablets (650 mg total) by mouth every 6 (six) hours as needed for mild pain (pain score 1-3) (or Fever >/= 101).   amLODipine (NORVASC) 5 MG tablet Take 1 tablet (5 mg total) by mouth daily.   amoxicillin-clavulanate (AUGMENTIN) 500-125 MG tablet Take 1 tablet by mouth 2 (two) times daily for 14 days.   apixaban (ELIQUIS) 2.5 MG TABS tablet Take 1 tablet (2.5 mg total) by mouth 2 (two) times daily.   cloNIDine (CATAPRES - DOSED IN MG/24 HR) 0.1 mg/24hr patch Place 1 patch (0.1 mg total) onto the skin every 7 (seven) days.   loratadine (CLARITIN) 10 MG tablet Take 10 mg by mouth daily.   losartan (COZAAR) 100 MG tablet Take 1 tablet (100 mg total) by mouth daily.   Multiple Vitamin (MULTIVITAMIN ADULT PO) Take 1 tablet by mouth daily.   pantoprazole (PROTONIX) 40 MG tablet Take 1 tablet (40 mg total) by mouth 2 (two) times daily.   No facility-administered encounter medications on file as of 07/01/2023.    Review of Systems:  Review of Systems  Health Maintenance  Topic Date Due   COVID-19 Vaccine (5 - 2024-25 season) 07/20/2024 (Originally 01/16/2023)   Zoster Vaccines- Shingrix (1 of 2) 09/28/2024  (Originally 03/04/1953)   Medicare Annual Wellness (AWV)  01/13/2024   DTaP/Tdap/Td (3 - Tdap) 01/16/2024   Pneumonia Vaccine 46+ Years old  Completed   INFLUENZA VACCINE  Completed   DEXA SCAN  Completed   HPV VACCINES  Aged Out    Physical Exam: Vitals:   07/01/23 1543 07/01/23 1547  BP: (!) 164/82 (!) 162/78  Pulse: 76   Temp: 97.6 F (36.4 C)   SpO2: 96%   Weight: 136 lb (61.7 kg)   Height: 5\' 4"  (1.626 m)    Body mass index is 23.34 kg/m. Physical Exam Constitutional:      Appearance: Normal appearance.  HENT:     Head: Normocephalic and atraumatic.  Cardiovascular:     Rate and Rhythm: Normal rate and regular rhythm.     Pulses: Normal pulses.     Heart sounds: Normal heart sounds.  Pulmonary:     Effort: Pulmonary effort is normal.  Musculoskeletal:        General: No swelling or tenderness.  Skin:    General: Skin is warm and dry.  Neurological:     Mental Status: She is alert and oriented  to person, place, and time.     Gait: Gait normal.  Psychiatric:        Mood and Affect: Mood normal.    Physical Exam          Labs reviewed: Basic Metabolic Panel: Recent Labs    06/13/23 0500 06/14/23 0351 06/15/23 0332 06/16/23 0427 06/22/23 1906 06/23/23 0554 06/25/23 0503 06/26/23 0452  NA 140 138 139 140   < > 140 139 138  K 3.4* 3.5 3.6 3.4*   < > 3.7 3.6 3.6  CL 112* 108 106 111   < > 111 108 109  CO2 19* 23 22 22    < > 22 23 21*  GLUCOSE 99 104* 94 92   < > 96 86 87  BUN 24* 20 28* 27*   < > 23 20 17   CREATININE 1.39* 1.34* 1.65* 1.42*   < > 1.40* 1.47* 1.39*  CALCIUM 9.5 9.3 9.3 9.2   < > 8.9 9.2 9.3  MG 1.9 2.1 2.1 2.1  --   --   --   --   PHOS 2.7  --   --   --   --   --   --   --    < > = values in this interval not displayed.   Liver Function Tests: Recent Labs    06/10/23 1427 06/11/23 1500 06/22/23 1906  AST 29 27 24   ALT 15 15 17   ALKPHOS 61 68 56  BILITOT 1.0 1.0 0.6  PROT 6.4* 6.7 6.0*  ALBUMIN 3.6 4.0 3.6   Recent  Labs    06/22/23 1906  LIPASE 43   No results for input(s): "AMMONIA" in the last 8760 hours. CBC: Recent Labs    12/27/22 0814 06/10/23 1418 06/11/23 1500 06/12/23 0507 06/22/23 1906 06/23/23 0554 06/25/23 0503 06/26/23 0452  WBC 24.5*   < > 27.0*   < > 29.3* 16.6* 16.0* 14.2*  NEUTROABS 3,455  --  4.2  --  6.0  --   --   --   HGB 11.2*   < > 11.4*   < > 10.3* 8.7* 9.5* 9.7*  HCT 34.2*   < > 34.1*   < > 28.8* 23.8* 27.2* 28.3*  MCV 105.6*   < > 105.2*   < > 112.1* 110.7* 101.9* 104.0*  PLT 77*   < > 110*   < > 146* 113* 113* 105*   < > = values in this interval not displayed.   Lipid Panel: No results for input(s): "CHOL", "HDL", "LDLCALC", "TRIG", "CHOLHDL", "LDLDIRECT" in the last 8760 hours. Lab Results  Component Value Date   HGBA1C 5.4 08/02/2022   Results   LABS Blood culture: Clostridium ramosum (06/10/2023)  RADIOLOGY PET scan: Normal  DIAGNOSTIC Colonoscopy: No cancer (06/22/2023) TEE: Normal      Procedures since last visit: US ABDOMEN LIMITED RUQ (LIVER/GB) Result Date: 06/22/2023 CLINICAL DATA:  Cholecystitis.  Abdominal pain EXAM: ULTRASOUND ABDOMEN LIMITED RIGHT UPPER QUADRANT COMPARISON:  CT 06/22/2023 FINDINGS: Gallbladder: Multiple small stones layering in the gallbladder. No gallbladder wall thickening or edema. Murphy's sign is negative. Common bile duct: Diameter: 4 mm, normal Liver: No focal lesion identified. Within normal limits in parenchymal echogenicity. Portal vein is patent on color Doppler imaging with normal direction of blood flow towards the liver. Other: Right renal atrophy. Increased echotexture consistent with medical renal disease. IMPRESSION: Cholelithiasis. No additional changes to suggest acute cholecystitis. Electronically Signed   By: Marisa Cyphers.D.  On: 06/22/2023 22:30   CT ABDOMEN PELVIS W CONTRAST Result Date: 06/22/2023 CLINICAL DATA:  Acute nonlocalized abdominal pain. Nausea, vomiting, and diarrhea. Dark stools.  EXAM: CT ABDOMEN AND PELVIS WITH CONTRAST TECHNIQUE: Multidetector CT imaging of the abdomen and pelvis was performed using the standard protocol following bolus administration of intravenous contrast. RADIATION DOSE REDUCTION: This exam was performed according to the departmental dose-optimization program which includes automated exposure control, adjustment of the mA and/or kV according to patient size and/or use of iterative reconstruction technique. CONTRAST:  75mL OMNIPAQUE IOHEXOL 300 MG/ML  SOLN COMPARISON:  PET-CT 06/15/2023. CT chest abdomen and pelvis 06/13/2023. FINDINGS: Lower chest: Linear scarring in the lung bases. Hepatobiliary: Layering stones or sludge in the gallbladder. No gallbladder wall thickening. Bile ducts are not dilated. No focal liver lesions. Pancreas: Unremarkable. No pancreatic ductal dilatation or surrounding inflammatory changes. Spleen: Normal in size without focal abnormality. Adrenals/Urinary Tract: No adrenal gland nodules. Prominent right renal atrophy. No hydronephrosis or hydroureter. Bladder is normal. Bilateral renal cysts. Largest on the left measures 1.6 cm diameter. No imaging follow-up is indicated. Stomach/Bowel: Stomach, small bowel, and colon are not abnormally distended. Stool fills the colon. Colonic diverticulosis without evidence of acute diverticulitis. No wall thickening or inflammatory changes. Appendix is normal. Vascular/Lymphatic: Aortic calcification. 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 Stent graft is patent. No significant lymphadenopathy. Reproductive: Large calcification posterior to the uterus likely representing calcified fibroid. No abnormal adnexal masses. Other: No free air or free fluid. Abdominal wall musculature appears intact. Musculoskeletal: Postoperative right hip hemiarthroplasty. Mild degenerative changes in the spine. IMPRESSION: 1. No acute process demonstrated in the abdomen or pelvis. No evidence of bowel  obstruction or inflammation. 2. Chronic atrophy of the right kidney. 3. Layering stones or sludge in the gallbladder without evidence of acute cholecystitis. 4. Aortic atherosclerosis. Postoperative changes of aortoiliac stent graft. Electronically Signed   By: Burman Nieves M.D.   On: 06/22/2023 20:12    Assessment/Plan Moderate dementia without behavioral disturbance, psychotic disturbance, mood disturbance, or anxiety, unspecified dementia type (HCC), Chronic - Plan: Do not attempt resuscitation (DNR)  Assessment and Plan    Clostridium Ramosum Bacteremia Clostridium ramosum bacteremia identified in blood cultures on January 24th and 25th. Treated with Augmentin (amoxicillin/clavulanic acid) for two weeks. Follow-up blood culture scheduled for February 24th to ensure infection clearance. Discussed risks of untreated bacteremia, including potential heart valve and aortic graft infections, and benefits of completing antibiotic course. - Continue Augmentin as prescribed - Repeat blood culture on February 24th - Follow up with infectious disease specialist on March 20th  Chronic Lymphocytic Leukemia (CLL) Long-standing CLL for approximately 50 years, likely exacerbated by recent hospitalizations and infections. Patient expressed fatigue from numerous tests and appointments but agreed to follow up with oncologist to discuss lab results and potential treatment options. - Follow up with oncologist on February 19th  Atrial Fibrillation On Eliquis for atrial fibrillation, which was temporarily stopped for colonoscopy but has since been resumed. Discussed risks of atrial fibrillation, including stroke, and benefits of anticoagulation therapy. - Continue Eliquis as prescribed  Hypertension Hypertension with recent readings around 180/85 mmHg. On losartan 100 mg for blood pressure management. Discussed risks of uncontrolled hypertension, including stroke and heart attack, and importance of regular  monitoring. - Continue losartan 100 mg daily - Monitor blood pressure regularly  Gastroesophageal Reflux Disease (GERD) On Protonix for GERD management and gastrointestinal protection. Discussed benefits of Protonix in managing GERD symptoms and protecting gastrointestinal tract. -  Continue Protonix as prescribed  General Health Maintenance Patient is well-oriented and managing health independently. Informed about importance of regular follow-ups to monitor recovery post-hospitalization. - Follow up in one month to assess recovery post-hospitalization - Monitor cognitive function and independence  Follow-up - Follow up with primary care on February 26th - Follow up with cardiologist on March 12th - Schedule follow-up appointment with primary care in one month.       Labs/tests ordered:  * No order type specified * Next appt:  07/13/2023

## 2023-07-02 DIAGNOSIS — F03B0 Unspecified dementia, moderate, without behavioral disturbance, psychotic disturbance, mood disturbance, and anxiety: Secondary | ICD-10-CM | POA: Insufficient documentation

## 2023-07-04 LAB — CULTURE, BLOOD (ROUTINE X 2): Special Requests: ADEQUATE

## 2023-07-05 NOTE — Consult Note (Signed)
ED Culture Review:   Pt's bcx resulted in CLOSTRIDIUM RAMOSUM. Pt's PCP is aware and pt is on abx. Plan for repeat bcx 2/24.    Paschal Dopp, PharmD, BCPS

## 2023-07-06 ENCOUNTER — Inpatient Hospital Stay: Payer: Medicare Other | Admitting: Internal Medicine

## 2023-07-08 ENCOUNTER — Inpatient Hospital Stay: Payer: Medicare Other | Admitting: Internal Medicine

## 2023-07-11 DIAGNOSIS — R7881 Bacteremia: Secondary | ICD-10-CM | POA: Diagnosis not present

## 2023-07-11 DIAGNOSIS — I10 Essential (primary) hypertension: Secondary | ICD-10-CM | POA: Diagnosis not present

## 2023-07-11 DIAGNOSIS — B9689 Other specified bacterial agents as the cause of diseases classified elsewhere: Secondary | ICD-10-CM | POA: Diagnosis not present

## 2023-07-12 ENCOUNTER — Other Ambulatory Visit: Payer: Self-pay

## 2023-07-12 ENCOUNTER — Emergency Department: Payer: Medicare Other

## 2023-07-12 ENCOUNTER — Encounter: Payer: Self-pay | Admitting: Internal Medicine

## 2023-07-12 ENCOUNTER — Inpatient Hospital Stay: Payer: Medicare Other | Attending: Internal Medicine | Admitting: Internal Medicine

## 2023-07-12 ENCOUNTER — Emergency Department
Admission: EM | Admit: 2023-07-12 | Discharge: 2023-07-13 | Disposition: A | Discharge status: Home / Self Care | Payer: Medicare Other | Attending: Emergency Medicine | Admitting: Emergency Medicine

## 2023-07-12 DIAGNOSIS — K8010 Calculus of gallbladder with chronic cholecystitis without obstruction: Secondary | ICD-10-CM | POA: Diagnosis not present

## 2023-07-12 DIAGNOSIS — N261 Atrophy of kidney (terminal): Secondary | ICD-10-CM | POA: Insufficient documentation

## 2023-07-12 DIAGNOSIS — R1110 Vomiting, unspecified: Secondary | ICD-10-CM | POA: Insufficient documentation

## 2023-07-12 DIAGNOSIS — C9110 Chronic lymphocytic leukemia of B-cell type not having achieved remission: Secondary | ICD-10-CM | POA: Insufficient documentation

## 2023-07-12 DIAGNOSIS — D259 Leiomyoma of uterus, unspecified: Secondary | ICD-10-CM | POA: Insufficient documentation

## 2023-07-12 DIAGNOSIS — Z7901 Long term (current) use of anticoagulants: Secondary | ICD-10-CM | POA: Insufficient documentation

## 2023-07-12 DIAGNOSIS — R638 Other symptoms and signs concerning food and fluid intake: Secondary | ICD-10-CM | POA: Insufficient documentation

## 2023-07-12 DIAGNOSIS — N281 Cyst of kidney, acquired: Secondary | ICD-10-CM | POA: Insufficient documentation

## 2023-07-12 DIAGNOSIS — M8589 Other specified disorders of bone density and structure, multiple sites: Secondary | ICD-10-CM | POA: Diagnosis not present

## 2023-07-12 DIAGNOSIS — I4891 Unspecified atrial fibrillation: Secondary | ICD-10-CM | POA: Insufficient documentation

## 2023-07-12 DIAGNOSIS — K5730 Diverticulosis of large intestine without perforation or abscess without bleeding: Secondary | ICD-10-CM | POA: Diagnosis not present

## 2023-07-12 DIAGNOSIS — E860 Dehydration: Secondary | ICD-10-CM | POA: Diagnosis not present

## 2023-07-12 DIAGNOSIS — J449 Chronic obstructive pulmonary disease, unspecified: Secondary | ICD-10-CM | POA: Insufficient documentation

## 2023-07-12 DIAGNOSIS — Z993 Dependence on wheelchair: Secondary | ICD-10-CM | POA: Diagnosis not present

## 2023-07-12 DIAGNOSIS — R109 Unspecified abdominal pain: Secondary | ICD-10-CM | POA: Insufficient documentation

## 2023-07-12 DIAGNOSIS — K838 Other specified diseases of biliary tract: Secondary | ICD-10-CM | POA: Diagnosis not present

## 2023-07-12 DIAGNOSIS — R110 Nausea: Secondary | ICD-10-CM

## 2023-07-12 DIAGNOSIS — Z79899 Other long term (current) drug therapy: Secondary | ICD-10-CM | POA: Insufficient documentation

## 2023-07-12 DIAGNOSIS — K8020 Calculus of gallbladder without cholecystitis without obstruction: Secondary | ICD-10-CM | POA: Diagnosis not present

## 2023-07-12 DIAGNOSIS — R112 Nausea with vomiting, unspecified: Secondary | ICD-10-CM | POA: Diagnosis not present

## 2023-07-12 DIAGNOSIS — Z96641 Presence of right artificial hip joint: Secondary | ICD-10-CM | POA: Diagnosis not present

## 2023-07-12 DIAGNOSIS — I517 Cardiomegaly: Secondary | ICD-10-CM | POA: Diagnosis not present

## 2023-07-12 DIAGNOSIS — R1032 Left lower quadrant pain: Secondary | ICD-10-CM | POA: Diagnosis not present

## 2023-07-12 DIAGNOSIS — I2510 Atherosclerotic heart disease of native coronary artery without angina pectoris: Secondary | ICD-10-CM | POA: Diagnosis not present

## 2023-07-12 LAB — CBC
HCT: 29.5 % — ABNORMAL LOW (ref 36.0–46.0)
Hemoglobin: 10 g/dL — ABNORMAL LOW (ref 12.0–15.0)
MCH: 37.2 pg — ABNORMAL HIGH (ref 26.0–34.0)
MCHC: 33.9 g/dL (ref 30.0–36.0)
MCV: 109.7 fL — ABNORMAL HIGH (ref 80.0–100.0)
Platelets: 145 10*3/uL — ABNORMAL LOW (ref 150–400)
RBC: 2.69 MIL/uL — ABNORMAL LOW (ref 3.87–5.11)
RDW: 14.8 % (ref 11.5–15.5)
WBC: 21 10*3/uL — ABNORMAL HIGH (ref 4.0–10.5)
nRBC: 0.1 % (ref 0.0–0.2)

## 2023-07-12 LAB — COMPREHENSIVE METABOLIC PANEL
ALT: 15 U/L (ref 0–44)
AST: 22 U/L (ref 15–41)
Albumin: 3.7 g/dL (ref 3.5–5.0)
Alkaline Phosphatase: 64 U/L (ref 38–126)
Anion gap: 10 (ref 5–15)
BUN: 32 mg/dL — ABNORMAL HIGH (ref 8–23)
CO2: 21 mmol/L — ABNORMAL LOW (ref 22–32)
Calcium: 9.9 mg/dL (ref 8.9–10.3)
Chloride: 106 mmol/L (ref 98–111)
Creatinine, Ser: 1.58 mg/dL — ABNORMAL HIGH (ref 0.44–1.00)
GFR, Estimated: 31 mL/min — ABNORMAL LOW (ref >60)
Glucose, Bld: 130 mg/dL — ABNORMAL HIGH (ref 70–99)
Potassium: 4 mmol/L (ref 3.5–5.1)
Sodium: 137 mmol/L (ref 135–145)
Total Bilirubin: 0.6 mg/dL (ref 0.0–1.2)
Total Protein: 6.4 g/dL — ABNORMAL LOW (ref 6.5–8.1)

## 2023-07-12 LAB — CBC WITH DIFFERENTIAL/PLATELET
Abs Immature Granulocytes: 0.08 10*3/uL — ABNORMAL HIGH (ref 0.00–0.07)
Absolute Lymphocytes: 12250 {cells}/uL — ABNORMAL HIGH (ref 850–3900)
Absolute Monocytes: 1855 {cells}/uL — ABNORMAL HIGH (ref 200–950)
Basophils Absolute: 53 {cells}/uL (ref 0–200)
Basophils Absolute: 0.1 10*3/uL (ref 0.0–0.1)
Basophils Relative: 0.3 %
Basophils Relative: 0 %
Eosinophils Absolute: 140 {cells}/uL (ref 15–500)
Eosinophils Absolute: 0 10*3/uL (ref 0.0–0.5)
Eosinophils Relative: 0.8 %
Eosinophils Relative: 0 %
HCT: 31.2 % — ABNORMAL LOW (ref 35.0–45.0)
HCT: 30.1 % — ABNORMAL LOW (ref 36.0–46.0)
Hemoglobin: 10.3 g/dL — ABNORMAL LOW (ref 11.7–15.5)
Hemoglobin: 10 g/dL — ABNORMAL LOW (ref 12.0–15.0)
Immature Granulocytes: 0 %
Lymphocytes Relative: 66 %
Lymphs Abs: 14.1 10*3/uL — ABNORMAL HIGH (ref 0.7–4.0)
MCH: 35.6 pg — ABNORMAL HIGH (ref 27.0–33.0)
MCH: 36.4 pg — ABNORMAL HIGH (ref 26.0–34.0)
MCHC: 33 g/dL (ref 32.0–36.0)
MCHC: 33.2 g/dL (ref 30.0–36.0)
MCV: 108 fL — ABNORMAL HIGH (ref 80.0–100.0)
MCV: 109.5 fL — ABNORMAL HIGH (ref 80.0–100.0)
MPV: 11.5 fL (ref 7.5–12.5)
Monocytes Absolute: 0.3 10*3/uL (ref 0.1–1.0)
Monocytes Relative: 10.6 %
Monocytes Relative: 1 %
Neutro Abs: 3203 {cells}/uL (ref 1500–7800)
Neutro Abs: 7 10*3/uL (ref 1.7–7.7)
Neutrophils Relative %: 18.3 %
Neutrophils Relative %: 33 %
Platelets: 119 10*3/uL — ABNORMAL LOW (ref 140–400)
Platelets: 145 10*3/uL — ABNORMAL LOW (ref 150–400)
RBC: 2.89 10*6/uL — ABNORMAL LOW (ref 3.80–5.10)
RBC: 2.75 MIL/uL — ABNORMAL LOW (ref 3.87–5.11)
RDW: 13.6 % (ref 11.0–15.0)
RDW: 14.8 % (ref 11.5–15.5)
Smear Review: NORMAL
Total Lymphocyte: 70 %
WBC: 17.5 10*3/uL — ABNORMAL HIGH (ref 3.8–10.8)
WBC: 21.5 10*3/uL — ABNORMAL HIGH (ref 4.0–10.5)
nRBC: 0 % (ref 0.0–0.2)

## 2023-07-12 LAB — COMPLETE METABOLIC PANEL WITH GFR
AG Ratio: 1.8 (calc) (ref 1.0–2.5)
ALT: 11 U/L (ref 6–29)
AST: 18 U/L (ref 10–35)
Albumin: 3.6 g/dL (ref 3.6–5.1)
Alkaline phosphatase (APISO): 67 U/L (ref 37–153)
BUN/Creatinine Ratio: 14 (calc) (ref 6–22)
BUN: 22 mg/dL (ref 7–25)
CO2: 27 mmol/L (ref 20–32)
Calcium: 9.8 mg/dL (ref 8.6–10.4)
Chloride: 108 mmol/L (ref 98–110)
Creat: 1.6 mg/dL — ABNORMAL HIGH (ref 0.60–0.95)
Globulin: 2 g/dL (ref 1.9–3.7)
Glucose, Bld: 95 mg/dL (ref 65–99)
Potassium: 3.8 mmol/L (ref 3.5–5.3)
Sodium: 139 mmol/L (ref 135–146)
Total Bilirubin: 0.5 mg/dL (ref 0.2–1.2)
Total Protein: 5.6 g/dL — ABNORMAL LOW (ref 6.1–8.1)
eGFR: 31 mL/min/{1.73_m2} — ABNORMAL LOW (ref >=60)

## 2023-07-12 LAB — LIPID PANEL
Cholesterol: 172 mg/dL (ref <200)
HDL: 43 mg/dL — ABNORMAL LOW (ref >=50)
LDL Cholesterol (Calc): 107 mg/dL — ABNORMAL HIGH
Non-HDL Cholesterol (Calc): 129 mg/dL (ref <130)
Total CHOL/HDL Ratio: 4 (calc) (ref <5.0)
Triglycerides: 120 mg/dL (ref <150)

## 2023-07-12 LAB — URINALYSIS, W/ REFLEX TO CULTURE (INFECTION SUSPECTED)
Bacteria, UA: NONE SEEN
Bilirubin Urine: NEGATIVE
Glucose, UA: NEGATIVE mg/dL
Hgb urine dipstick: NEGATIVE
Ketones, ur: NEGATIVE mg/dL
Leukocytes,Ua: NEGATIVE
Nitrite: NEGATIVE
Protein, ur: >=300 mg/dL — AB
Specific Gravity, Urine: 1.012 (ref 1.005–1.030)
pH: 7 (ref 5.0–8.0)

## 2023-07-12 LAB — LIPASE, BLOOD: Lipase: 42 U/L (ref 11–51)

## 2023-07-12 LAB — PROTIME-INR
INR: 1 (ref 0.8–1.2)
Prothrombin Time: 13.4 s (ref 11.4–15.2)

## 2023-07-12 LAB — RESP PANEL BY RT-PCR (RSV, FLU A&B, COVID)  RVPGX2
Influenza A by PCR: NEGATIVE
Influenza B by PCR: NEGATIVE
Resp Syncytial Virus by PCR: NEGATIVE
SARS Coronavirus 2 by RT PCR: NEGATIVE

## 2023-07-12 LAB — LACTIC ACID, PLASMA
Lactic Acid, Venous: 1.1 mmol/L (ref 0.5–1.9)
Lactic Acid, Venous: 1 mmol/L (ref 0.5–1.9)

## 2023-07-12 LAB — APTT: aPTT: 37 s — ABNORMAL HIGH (ref 24–36)

## 2023-07-12 MED ORDER — METRONIDAZOLE 500 MG/100ML IV SOLN
500.0000 mg | Freq: Once | INTRAVENOUS | Status: AC
  Administered 2023-07-12: 500 mg via INTRAVENOUS
  Filled 2023-07-12: qty 100

## 2023-07-12 MED ORDER — ONDANSETRON 8 MG PO TBDP: 8.0000 mg | ORAL_TABLET | Freq: Three times a day (TID) | ORAL | 0 refills | Status: DC | PRN

## 2023-07-12 MED ORDER — SODIUM CHLORIDE 0.9 % IV BOLUS (SEPSIS)
1000.0000 mL | Freq: Once | INTRAVENOUS | Status: AC
  Administered 2023-07-12: 1000 mL via INTRAVENOUS

## 2023-07-12 MED ORDER — SODIUM CHLORIDE 0.9 % IV SOLN
2.0000 g | Freq: Once | INTRAVENOUS | Status: AC
  Administered 2023-07-12: 2 g via INTRAVENOUS
  Filled 2023-07-12: qty 20

## 2023-07-12 MED ORDER — SODIUM CHLORIDE 0.9 % IV BOLUS: 1000.0000 mL | Freq: Once | INTRAVENOUS | Status: DC

## 2023-07-12 MED ORDER — LACTATED RINGERS IV SOLN
INTRAVENOUS | Status: DC
Start: 2023-07-12 — End: 2023-07-13

## 2023-07-12 NOTE — ED Provider Notes (Signed)
 [facility] Provider Note   Event Date/Time   First MD Initiated Contact with Patient 07/12/23 1620     (approximate) History  Emesis  HPI Tiffany Martinez is a 89 y.o. female with a stated past medical history of CLL who presents complaining of worsening nausea and low energy after starting a low-calorie diet.  Patient states that she has also been having intermittent nausea after eating for the past few weeks.  Patient states that she has bilateral lower quadrant abdominal pain after eating that feels like bloating however she denies any diarrhea.  Patient has had 1 episode of vomiting overnight ROS: Patient currently denies any vision changes, tinnitus, difficulty speaking, facial droop, sore throat, chest pain, shortness of breath, diarrhea, dysuria, or weakness/numbness/paresthesias in any extremity   Physical Exam  Triage Vital Signs: ED Triage Vitals  Encounter Vitals Group     BP 07/12/23 1526 (!) 186/53     Systolic BP Percentile --      Diastolic BP Percentile --      Pulse Rate 07/12/23 1526 (!) 53     Resp 07/12/23 1526 20     Temp 07/12/23 1526 97.7 F (36.5 C)     Temp Source 07/12/23 1526 Oral     SpO2 07/12/23 1526 100 %     Weight 07/12/23 1523 135 lb (61.2 kg)     Height 07/12/23 1523 5\' 4"  (1.626 m)     Head Circumference --      Peak Flow --      Pain Score 07/12/23 1523 9     Pain Loc --      Pain Education --      Exclude from Growth Chart --    Most recent vital signs: Vitals:   07/12/23 1908 07/12/23 1930  BP: (!) 182/73 (!) 178/62  Pulse: (!) 57 60  Resp: 18 19  Temp:    SpO2: 97% 100%   General: Awake, oriented x4. CV:  Good peripheral perfusion.  Resp:  Normal effort.  Abd:  No distention.  Other:  Elderly well-developed, well-nourished Caucasian female resting comfortably in no acute distress ED Results / Procedures / Treatments  Labs (all labs ordered are listed, but only abnormal results are displayed) Labs  Reviewed  COMPREHENSIVE METABOLIC PANEL - Abnormal; Notable for the following components:      Result Value   CO2 21 (*)    Glucose, Bld 130 (*)    BUN 32 (*)    Creatinine, Ser 1.58 (*)    Total Protein 6.4 (*)    GFR, Estimated 31 (*)    All other components within normal limits  CBC - Abnormal; Notable for the following components:   WBC 21.0 (*)    RBC 2.69 (*)    Hemoglobin 10.0 (*)    HCT 29.5 (*)    MCV 109.7 (*)    MCH 37.2 (*)    Platelets 145 (*)    All other components within normal limits  APTT - Abnormal; Notable for the following components:   aPTT 37 (*)    All other components within normal limits  CBC WITH DIFFERENTIAL/PLATELET - Abnormal; Notable for the following components:   WBC 21.5 (*)    RBC 2.75 (*)    Hemoglobin 10.0 (*)    HCT 30.1 (*)    MCV 109.5 (*)    MCH 36.4 (*)    Platelets 145 (*)    Lymphs Abs 14.1 (*)  Abs Immature Granulocytes 0.08 (*)    All other components within normal limits  RESP PANEL BY RT-PCR (RSV, FLU A&B, COVID)  [id number]  CULTURE, BLOOD (ROUTINE X 2)  CULTURE, BLOOD (ROUTINE X 2)  LIPASE, BLOOD  LACTIC ACID, PLASMA  LACTIC ACID, PLASMA  PROTIME-INR  URINALYSIS, W/ REFLEX TO CULTURE (INFECTION SUSPECTED)   EKG ED ECG REPORT I, Merwyn Katos, the attending physician, personally viewed and interpreted this ECG. Date: 07/12/2023 EKG Time: 1641 Rate: 52 Rhythm: Bradycardic sinus rhythm QRS Axis: normal Intervals: normal ST/T Wave abnormalities: normal Narrative Interpretation: Bradycardic sinus rhythm.  No evidence of acute ischemia RADIOLOGY ED MD interpretation: CT of the abdomen and pelvis without IV contrast independently interpreted and shows no acute abnormalities  One-view portable chest x-ray interpreted by me shows no evidence of acute abnormalities including no pneumonia, pneumothorax, or widened mediastinum -Agree with radiology assessment Official radiology report(s): CT ABDOMEN PELVIS WO  CONTRAST Result Date: 07/12/2023 CLINICAL DATA:  Left lower quadrant abdominal pain, nausea, vomiting and diarrhea. EXAM: CT ABDOMEN AND PELVIS WITHOUT CONTRAST TECHNIQUE: Multidetector CT imaging of the abdomen and pelvis was performed following the standard protocol without IV contrast. RADIATION DOSE REDUCTION: This exam was performed according to the departmental dose-optimization program which includes automated exposure control, adjustment of the mA and/or kV according to patient size and/or use of iterative reconstruction technique. COMPARISON:  06/22/2023 FINDINGS: Lower chest: Borderline enlarged heart. Mild right basilar and minimal left basilar linear atelectasis or scarring. Hepatobiliary: Multiple small gallstones in the dependent portion of the gallbladder, measuring up to 3 mm in maximum diameter each. There is also sludge in the gallbladder. No gallbladder wall thickening or pericholecystic fluid. Pancreas: Unremarkable. No pancreatic ductal dilatation or surrounding inflammatory changes. Spleen: Normal in size without focal abnormality. Adrenals/Urinary Tract: Unremarkable adrenal glands. Stable moderate to marked right renal atrophy. Previously demonstrated small bilateral simple appearing renal cysts not needing imaging follow-up. Unremarkable ureters and urinary bladder. Stomach/Bowel: Mild colonic diverticulosis without evidence of diverticulitis. Stable prominent stool in the sigmoid colon. Interval small appendicoliths without evidence of appendicitis. Unremarkable stomach and small bowel. Vascular/Lymphatic: Atheromatous arterial calcifications without aneurysm. No enlarged lymph nodes. Stable aorto bi-iliac stent graft. Reproductive: No significant change in partially calcified uterine fibroids. No adnexal mass Other: No abdominal wall hernia or abnormality. No abdominopelvic ascites. Musculoskeletal: Stable right hip prosthesis. Lumbar spine degenerative changes. IMPRESSION: 1. No acute  abnormality. 2. Mild colonic diverticulosis without evidence of diverticulitis. 3. Stable prominent stool in the sigmoid colon. 4. Cholelithiasis and gallbladder sludge without evidence of cholecystitis. 5. Stable moderate to marked right renal atrophy. 6. Stable uterine fibroids. Electronically Signed   By: Beckie Salts M.D.   On: 07/12/2023 18:55   DG Chest Port 1 View Result Date: 07/12/2023 CLINICAL DATA:  Possible sepsis. EXAM: PORTABLE CHEST 1 VIEW COMPARISON:  06/11/2023 FINDINGS: Stable normal sized heart and tortuous and calcified thoracic aorta. The lungs remain mildly hyperexpanded and clear with normal vascularity. Diffuse osteopenia. IMPRESSION: 1. No acute abnormality. 2. COPD. 3. Diffuse osteopenia. Electronically Signed   By: Beckie Salts M.D.   On: 07/12/2023 17:48   PROCEDURES: Critical Care performed: No Procedures MEDICATIONS ORDERED IN ED: Medications  lactated ringers infusion ( Intravenous New Bag/Given 07/12/23 1652)  sodium chloride 0.9 % bolus 1,000 mL (0 mLs Intravenous Stopped 07/12/23 1723)    And  sodium chloride 0.9 % bolus 1,000 mL (0 mLs Intravenous Stopped 07/12/23 1818)  cefTRIAXone (ROCEPHIN) 2 g in sodium chloride 0.9 %  100 mL IVPB (0 g Intravenous Stopped 07/12/23 1723)  metroNIDAZOLE (FLAGYL) IVPB 500 mg (0 mg Intravenous Stopped 07/12/23 1818)   IMPRESSION / MDM / ASSESSMENT AND PLAN / ED COURSE  I reviewed the triage vital signs and the nursing notes.                             The patient is on the cardiac monitor to evaluate for evidence of arrhythmia and/or significant heart rate changes. Patient's presentation is most consistent with acute presentation with potential threat to life or bodily function. Patient presents for acute nausea/vomiting The cause of the patient's symptoms is not clear, but the patient is overall well appearing and is suspected to have a transient course of illness.  Given History and Exam there does not appear to be an  emergent cause of the symptoms such as small bowel obstruction, coronary syndrome, bowel ischemia, DKA, pancreatitis, appendicitis, other acute abdomen or other emergent problem.  Reassessment: After treatment, the patient is feeling much better, tolerating PO fluids, and shows no signs of dehydration.  Patient encouraged to follow-up with her hematology/oncology physician Disposition: Discharge home with prompt primary care physician follow up in the next 48 hours. Strict return precautions discussed. FINAL CLINICAL IMPRESSION(S) / ED DIAGNOSES   Final diagnoses:  CLL (chronic lymphocytic leukemia) (HCC)  Nausea  Dehydration  Decreased oral intake   Rx / DC Orders   ED Discharge Orders          Ordered    ondansetron (ZOFRAN-ODT) 8 MG disintegrating tablet  Every 8 hours PRN        07/12/23 2023           Note:  This document was prepared using Dragon voice recognition software and may include unintentional dictation errors.   Merwyn Katos, MD 07/12/23 [phone]

## 2023-07-12 NOTE — Progress Notes (Signed)
 Pt arrived to clinic via Transportation from [facility], vomiting and nauseated. Cool washcloth applied. Emesis bag in hand. Pt brought to exam room.

## 2023-07-12 NOTE — ED Notes (Addendum)
 Pt to ct scan   iv infiltrated and removed.

## 2023-07-12 NOTE — ED Notes (Signed)
 Called [facility]/ Transport will arrive shortly   [facility] arrived to pickup pt/ pt doesn't meet transport criteria for [facility] therefore transport was denied

## 2023-07-12 NOTE — ED Notes (Signed)
 Per Dr. Vicente Males infuse back of LR at bolus rate and once finished she can be discharged

## 2023-07-12 NOTE — Progress Notes (Signed)
 [facility] CONSULT NOTE  Patient Care Team: Earnestine Mealing, MD as PCP - General (Family Medicine) Mariah Milling Tollie Pizza, MD as PCP - Cardiology (Cardiology) Tonny Bollman, MD (Cardiology) Earna Coder, MD as Consulting Physician (Oncology)  CHIEF COMPLAINTS/PURPOSE OF CONSULTATION: CLL  HISTORY OF PRESENTING ILLNESS: Patient ambulating- assistance.  Alone.  Tiffany Martinez 89 y.o.  female pleasant patient with a medical history significant for anxiety, CHF, CLL, hypertension, dyslipidemia, migraine, PAD, paroxysmal atrial fibrillation-was recently admitted to the hospital for syncope and incidentally noted to have Clostridium infection in the blood.  Patient status post antibiotics status post extensive evaluation of the GI/ID looking for sources of infection.  Patient had a PET scan in the hospital and also EGD/colonoscopy.  Patient treated with antibiotics at discharge.   Patient complains of worsening abdominal pain.  Cramping positive for nausea.  States it got worse after eating.  Review of Systems  Constitutional:  Positive for malaise/fatigue and weight loss. Negative for chills, diaphoresis and fever.  HENT:  Negative for nosebleeds and sore throat.   Eyes:  Negative for double vision.  Respiratory:  Negative for cough, hemoptysis, sputum production, shortness of breath and wheezing.   Cardiovascular:  Negative for chest pain, palpitations, orthopnea and leg swelling.  Gastrointestinal:  Positive for abdominal pain and nausea. Negative for blood in stool, constipation, diarrhea, heartburn, melena and vomiting.  Genitourinary:  Negative for dysuria, frequency and urgency.  Musculoskeletal:  Negative for back pain and joint pain.  Skin: Negative.  Negative for itching and rash.  Neurological:  Negative for dizziness, tingling, focal weakness, weakness and headaches.  Endo/Heme/Allergies:  Does not bruise/bleed easily.  Psychiatric/Behavioral:  Negative  for depression. The patient is not nervous/anxious and does not have insomnia.     MEDICAL HISTORY:  Past Medical History:  Diagnosis Date   AAA (abdominal aortic aneurysm) (HCC) 2012   3.9cm 2012; 2.8cm 12/2021   Anemia    Anxiety    CHF (congestive heart failure) (HCC)    LVEF 60-65% in 2023   Chronic venous insufficiency    CLL (chronic lymphocytic leukemia) (HCC) 2014   Stage 0--by flow cytometry   Hx of colonoscopy    Hyperlipidemia    Hypertension    Melanoma (HCC)    Right Arm   Migraine headache    3-4X/yr   PAD (peripheral artery disease) (HCC) 2012   severe R common iliac stenosis, ABI 0.73, mid L common iliac stenosis ABI 1.1   PAD (peripheral artery disease) (HCC)    Paroxysmal atrial fibrillation (HCC) 12/2021   Wears dentures    full upper and lower    SURGICAL HISTORY: Past Surgical History:  Procedure Laterality Date   ABDOMINAL AORTIC ANEURYSM REPAIR  02/16/12   [facility]--Dr Pattricia Boss   CARDIOVASCULAR STRESS TEST  2008   negative   CATARACT EXTRACTION W/PHACO Left 07/16/2019   Procedure: CATARACT EXTRACTION PHACO AND INTRAOCULAR LENS PLACEMENT (IOC) LEFT 8.26  00:46.6;  Surgeon: Nevada Crane, MD;  Location: [facility];  Service: Ophthalmology;  Laterality: Left;   DOBUTAMINE STRESS ECHO  11/11   negative   HIP ARTHROPLASTY Right 10/10/2019   Procedure: ARTHROPLASTY BIPOLAR HIP (HEMIARTHROPLASTY);  Surgeon: Christena Flake, MD;  Location: [facility];  Service: Orthopedics;  Laterality: Right;   KNEE ARTHROSCOPY  1998   right   MELANOMA EXCISION  11/09/10   wide excision right upper arm   SQUAMOUS CELL CARCINOMA EXCISION  02/2016   TEE WITHOUT CARDIOVERSION N/A  06/15/2023   Procedure: TRANSESOPHAGEAL ECHOCARDIOGRAM (TEE);  Surgeon: Antonieta Iba, MD;  Location: [facility];  Service: Cardiovascular;  Laterality: N/A;   TONSILLECTOMY AND ADENOIDECTOMY  1960    SOCIAL HISTORY: Social History   Socioeconomic History   Marital status: Widowed     Spouse name: Not on file   Number of children: 4   Years of education: Not on file   Highest education level: Not on file  Occupational History   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,   Tobacco Use   Smoking status: Every Day    Current packs/day: 0.00    Types: Cigarettes    Last attempt to quit: 03/17/2021    Years since quitting: 2.3    Passive exposure: Never   Smokeless tobacco: Never   Tobacco comments:    occassionally  Vaping Use   Vaping status: Never Used  Substance and Sexual Activity   Alcohol use: Yes    Alcohol/week: 7.0 standard drinks of alcohol    Types: 7 Glasses of wine per week    Comment: regular wine with dinner   Drug use: No   Sexual activity: Never  Other Topics Concern   Not on file  Social History Narrative   Widowed 2/11 -2nd for him , 1st for her. 4 step sons. 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         Has living will and health care POA--changing her POA at this point   Requests DNR   Discussed MOST form---but would probably accept IV fluids/antibiotics, etc   Requests no feeding tube         Social Drivers of Health   Financial Resource Strain: Not on file  Food Insecurity: No Food Insecurity (06/23/2023)   Hunger Vital Sign    Worried About Running Out of Food in the Last Year: Never true    Ran Out of Food in the Last Year: Never true  Transportation Needs: No Transportation Needs (06/23/2023)   PRAPARE - Administrator, Civil Service (Medical): No    Lack of Transportation (Non-Medical): No  Physical Activity: Not on file  Stress: Not on file  Social Connections: Moderately Isolated (06/23/2023)   Social Connection and Isolation Panel [NHANES]    Frequency of Communication with Friends and Family: More than three times a week    Frequency of Social Gatherings with Friends and Family: Twice a week    Attends Religious Services: 1 to 4 times per year    Active Member of [employer] or Organizations: No     Attends Banker Meetings: Never    Marital Status: Widowed  Intimate Partner Violence: Not At Risk (06/23/2023)   Humiliation, Afraid, Rape, and Kick questionnaire    Fear of Current or Ex-Partner: No    Emotionally Abused: No    Physically Abused: No    Sexually Abused: No    FAMILY HISTORY: Family History  Problem Relation Age of Onset   Alzheimer's disease Mother    Pancreatic cancer Father    Parkinson's disease Brother    Coronary artery disease Neg Hx    Diabetes Neg Hx    Cancer Neg Hx        breast or colon    ALLERGIES:  is allergic to triamterene.  MEDICATIONS:  Current Outpatient Medications  Medication Sig Dispense Refill   acetaminophen (TYLENOL) 325 MG tablet Take 2 tablets (650 mg total) by mouth every 6 (six)  hours as needed for mild pain (pain score 1-3) (or Fever >/= 101). 30 tablet 0   amLODipine (NORVASC) 5 MG tablet Take 1 tablet (5 mg total) by mouth daily. 90 tablet 3   apixaban (ELIQUIS) 2.5 MG TABS tablet Take 1 tablet (2.5 mg total) by mouth 2 (two) times daily.     cloNIDine (CATAPRES - DOSED IN MG/24 HR) 0.1 mg/24hr patch Place 1 patch (0.1 mg total) onto the skin every 7 (seven) days. 4 patch 0   loratadine (CLARITIN) 10 MG tablet Take 10 mg by mouth daily.     losartan (COZAAR) 100 MG tablet Take 1 tablet (100 mg total) by mouth daily. 90 tablet 3   Multiple Vitamin (MULTIVITAMIN ADULT PO) Take 1 tablet by mouth daily.     pantoprazole (PROTONIX) 40 MG tablet Take 1 tablet (40 mg total) by mouth 2 (two) times daily. 30 tablet 0   No current facility-administered medications for this visit.    PHYSICAL EXAMINATION:   Vitals:   07/12/23 1350 07/12/23 1423  BP: 118/65 (!) 185/51  Pulse: (!) 57 (!) 51  Temp: 97.8 F (36.6 C)   SpO2: 95% 100%   There were no vitals filed for this visit.  Physical Exam Vitals and nursing note reviewed.  HENT:     Head: Normocephalic and atraumatic.     Mouth/Throat:     Pharynx:  Oropharynx is clear.  Eyes:     Extraocular Movements: Extraocular movements intact.     Pupils: Pupils are equal, round, and reactive to light.  Cardiovascular:     Rate and Rhythm: Normal rate and regular rhythm.  Pulmonary:     Comments: Decreased breath sounds bilaterally.  Abdominal:     Palpations: Abdomen is soft.  Musculoskeletal:        General: Normal range of motion.     Cervical back: Normal range of motion.  Skin:    General: Skin is warm.  Neurological:     General: No focal deficit present.     Mental Status: She is alert and oriented to person, place, and time.  Psychiatric:        Behavior: Behavior normal.        Judgment: Judgment normal.     LABORATORY DATA:  I have reviewed the data as listed Lab Results  Component Value Date   WBC 17.5 (H) 07/11/2023   HGB 10.3 (L) 07/11/2023   HCT 31.2 (L) 07/11/2023   MCV 108.0 (H) 07/11/2023   PLT 119 (L) 07/11/2023   Recent Labs    06/10/23 1427 06/11/23 1500 06/12/23 0507 06/22/23 1906 06/23/23 0554 06/25/23 0503 06/26/23 0452 07/11/23 0802  NA  --  138   < > 141 140 139 138 139  K  --  3.8   < > 4.5 3.7 3.6 3.6 3.8  CL  --  108   < > 110 111 108 109 108  CO2  --  23   < > 22 22 23  21* 27  GLUCOSE  --  114*   < > 139* 96 86 87 95  BUN  --  29*   < > 27* 23 20 17 22   CREATININE  --  1.49*   < > 1.63* 1.40* 1.47* 1.39* 1.60*  CALCIUM  --  9.8   < > 9.5 8.9 9.2 9.3 9.8  GFRNONAA  --  34*   < > 30* 36* 34* 37*  --   PROT 6.4* 6.7  --  6.0*  --   --   --  5.6*  ALBUMIN 3.6 4.0  --  3.6  --   --   --   --   AST 29 27  --  24  --   --   --  18  ALT 15 15  --  17  --   --   --  11  ALKPHOS 61 68  --  56  --   --   --   --   BILITOT 1.0 1.0  --  0.6  --   --   --  0.5  BILIDIR 0.2  --   --   --   --   --   --   --   IBILI 0.8  --   --   --   --   --   --   --    < > = values in this interval not displayed.    RADIOGRAPHIC STUDIES: I have personally reviewed the radiological images as listed and agreed  with the findings in the report. US ABDOMEN LIMITED RUQ (LIVER/GB) Result Date: 06/22/2023 CLINICAL DATA:  Cholecystitis.  Abdominal pain EXAM: ULTRASOUND ABDOMEN LIMITED RIGHT UPPER QUADRANT COMPARISON:  CT 06/22/2023 FINDINGS: Gallbladder: Multiple small stones layering in the gallbladder. No gallbladder wall thickening or edema. Murphy's sign is negative. Common bile duct: Diameter: 4 mm, normal Liver: No focal lesion identified. Within normal limits in parenchymal echogenicity. Portal vein is patent on color Doppler imaging with normal direction of blood flow towards the liver. Other: Right renal atrophy. Increased echotexture consistent with medical renal disease. IMPRESSION: Cholelithiasis. No additional changes to suggest acute cholecystitis. Electronically Signed   By: Burman Nieves M.D.   On: 06/22/2023 22:30   CT ABDOMEN PELVIS W CONTRAST Result Date: 06/22/2023 CLINICAL DATA:  Acute nonlocalized abdominal pain. Nausea, vomiting, and diarrhea. Dark stools. EXAM: CT ABDOMEN AND PELVIS WITH CONTRAST TECHNIQUE: Multidetector CT imaging of the abdomen and pelvis was performed using the standard protocol following bolus administration of intravenous contrast. RADIATION DOSE REDUCTION: This exam was performed according to the departmental dose-optimization program which includes automated exposure control, adjustment of the mA and/or kV according to patient size and/or use of iterative reconstruction technique. CONTRAST:  75mL OMNIPAQUE IOHEXOL 300 MG/ML  SOLN COMPARISON:  PET-CT 06/15/2023. CT chest abdomen and pelvis 06/13/2023. FINDINGS: Lower chest: Linear scarring in the lung bases. Hepatobiliary: Layering stones or sludge in the gallbladder. No gallbladder wall thickening. Bile ducts are not dilated. No focal liver lesions. Pancreas: Unremarkable. No pancreatic ductal dilatation or surrounding inflammatory changes. Spleen: Normal in size without focal abnormality. Adrenals/Urinary Tract: No adrenal  gland nodules. Prominent right renal atrophy. No hydronephrosis or hydroureter. Bladder is normal. Bilateral renal cysts. Largest on the left measures 1.6 cm diameter. No imaging follow-up is indicated. Stomach/Bowel: Stomach, small bowel, and colon are not abnormally distended. Stool fills the colon. Colonic diverticulosis without evidence of acute diverticulitis. No wall thickening or inflammatory changes. Appendix is normal. Vascular/Lymphatic: Aortic calcification. 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 Stent graft is patent. No significant lymphadenopathy. Reproductive: Large calcification posterior to the uterus likely representing calcified fibroid. No abnormal adnexal masses. Other: No free air or free fluid. Abdominal wall musculature appears intact. Musculoskeletal: Postoperative right hip hemiarthroplasty. Mild degenerative changes in the spine. IMPRESSION: 1. No acute process demonstrated in the abdomen or pelvis. No evidence of bowel obstruction or inflammation. 2. Chronic atrophy of the right kidney. 3. Layering  stones or sludge in the gallbladder without evidence of acute cholecystitis. 4. Aortic atherosclerosis. Postoperative changes of aortoiliac stent graft. Electronically Signed   By: Burman Nieves M.D.   On: 06/22/2023 20:12   NM PET Image Initial (PI) Whole Body (F-18 FDG) Addendum Date: 06/16/2023 ADDENDUM REPORT: 06/16/2023 13:35 ADDENDUM: Voice recognition error: Correction - Incidental CT findings section: Aortic bi-iliac stent graft. NO abnormal metabolic activity. Findings conveyed to Dr Macario Golds 06/16/2023  at13:00. Electronically Signed   By: Genevive Bi M.D.   On: 06/16/2023 13:35   Result Date: 06/16/2023 CLINICAL DATA:  Initial treatment strategy for bacteremia on clear source. EXAM: NUCLEAR MEDICINE PET WHOLE BODY TECHNIQUE: 6.8 mCi F-18 FDG was injected intravenously. Full-ring PET imaging was performed from the head to foot after the  radiotracer. CT data was obtained and used for attenuation correction and anatomic localization. Fasting blood glucose: 90 mg/dl COMPARISON:  CT 04/54/0981 FINDINGS: HEAD/NECK: No hypermetabolic activity in the scalp. No hypermetabolic cervical lymph nodes. Incidental CT findings: none CHEST: No hypermetabolic mediastinal or hilar nodes. No suspicious pulmonary nodules on the CT scan. Incidental CT findings: Coronary artery calcification and aortic atherosclerotic calcification. No pneumonia. ABDOMEN/PELVIS: No abnormal hypermetabolic activity within the liver, pancreas, adrenal glands, or spleen. No hypermetabolic lymph nodes in the abdomen or pelvis. Incidental CT findings: Aortic bi-iliac stent graft. Abnormal metabolic activity. Large calcified leiomyoma posterior the uterus. SKELETON: No focal hypermetabolic activity to suggest skeletal metastasis. Incidental CT findings: none EXTREMITIES: No abnormal hypermetabolic activity in the lower extremities. Incidental CT findings: none IMPRESSION: 1. No source of infection identified by FDG PET-CT imaging. 2. No source of infection identified on the CT portion exam. Electronically Signed: By: Genevive Bi M.D. On: 06/15/2023 20:15   ECHO TEE Result Date: 06/15/2023    TRANSESOPHOGEAL ECHO REPORT   Patient Name:   Tiffany Martinez Date of Exam: 06/15/2023 Medical Rec #:  [id number]       Height:       61.0 in Accession #:    [id number]      Weight:       136.0 lb Date of Birth:  23-May-1933      BSA:          1.603 m Patient Age:    89 years        BP:           168/54 mmHg Patient Gender: F               HR:           65 bpm. Exam Location:  [facility] Procedure: Transesophageal Echo, Cardiac Doppler, Color Doppler and Saline            Contrast Bubble Study Indications:     Bacteremia R78.81  History:         Patient has prior history of Echocardiogram examinations, most                  recent 06/12/2023. CHF; Risk Factors:Dyslipidemia and                   Hypertension.  Sonographer:     Cristela Blue Referring Phys:  [id number] Antonieta Iba Diagnosing Phys: Julien Nordmann MD PROCEDURE: After discussion of the risks and benefits of a TEE, an informed consent was obtained from the patient. TEE procedure time was 30 minutes. The transesophogeal probe was passed without difficulty through the esophogus of the patient. Imaged were obtained with the patient in  a left lateral decubitus position. Local oropharyngeal anesthetic was provided with Cetacaine and viscous lidocaine. Sedation performed by different physician. Image quality was excellent. The patient's vital signs; including heart rate, blood pressure, and oxygen saturation; remained stable throughout the procedure. The patient developed no complications during the procedure.  IMPRESSIONS  1. Left ventricular ejection fraction, by estimation, is 55 to 60%. The left ventricle has normal function. The left ventricle has no regional wall motion abnormalities.  2. Right ventricular systolic function is normal. The right ventricular size is normal.  3. No left atrial/left atrial appendage thrombus was detected.  4. The mitral valve is normal in structure. Mild to moderate mitral valve regurgitation. No evidence of mitral stenosis.  5. The aortic valve is normal in structure. Aortic valve regurgitation is mild. No aortic stenosis is present.  6. The inferior vena cava is normal in size with greater than 50% respiratory variability, suggesting right atrial pressure of 3 mmHg.  7. Agitated saline contrast bubble study was negative, with no evidence of any interatrial shunt.  8. No valve vegetation noted Conclusion(s)/Recommendation(s): Normal biventricular function without evidence of hemodynamically significant valvular heart disease. FINDINGS  Left Ventricle: Left ventricular ejection fraction, by estimation, is 55 to 60%. The left ventricle has normal function. The left ventricle has no regional wall motion abnormalities. The  left ventricular internal cavity size was normal in size. There is  no left ventricular hypertrophy. Right Ventricle: The right ventricular size is normal. No increase in right ventricular wall thickness. Right ventricular systolic function is normal. Left Atrium: Left atrial size was normal in size. No left atrial/left atrial appendage thrombus was detected. Right Atrium: Right atrial size was normal in size. Pericardium: There is no evidence of pericardial effusion. Mitral Valve: The mitral valve is normal in structure. Mild to moderate mitral valve regurgitation. No evidence of mitral valve stenosis. There is no evidence of mitral valve vegetation. Tricuspid Valve: The tricuspid valve is normal in structure. Tricuspid valve regurgitation is not demonstrated. No evidence of tricuspid stenosis. There is no evidence of tricuspid valve vegetation. Aortic Valve: The aortic valve is normal in structure. Aortic valve regurgitation is mild. No aortic stenosis is present. There is no evidence of aortic valve vegetation. Pulmonic Valve: The pulmonic valve was normal in structure. Pulmonic valve regurgitation is not visualized. No evidence of pulmonic stenosis. There is no evidence of pulmonic valve vegetation. Aorta: The aortic root is normal in size and structure. There is minimal (Grade I) atheroma plaque involving the aortic arch and descending aorta. Venous: The inferior vena cava is normal in size with greater than 50% respiratory variability, suggesting right atrial pressure of 3 mmHg. IAS/Shunts: No atrial level shunt detected by color flow Doppler. Agitated saline contrast was given intravenously to evaluate for intracardiac shunting. Agitated saline contrast bubble study was negative, with no evidence of any interatrial shunt. There  is no evidence of a patent foramen ovale. There is no evidence of an atrial septal defect. Julien Nordmann MD Electronically signed by Julien Nordmann MD Signature Date/Time:  06/15/2023/9:27:03 PM    Final    CT Angio Chest/Abd/Pel for Dissection W and/or W/WO Result Date: 06/13/2023 CLINICAL DATA:  Aortic aneurysm suspected.  Aortic graft infection. EXAM: CT ANGIOGRAPHY CHEST, ABDOMEN AND PELVIS TECHNIQUE: Non-contrast CT of the chest was initially obtained. Multidetector CT imaging through the chest, abdomen and pelvis was performed using the standard protocol during bolus administration of intravenous contrast. Multiplanar reconstructed images and MIPs were obtained and reviewed  to evaluate the vascular anatomy. RADIATION DOSE REDUCTION: This exam was performed according to the departmental dose-optimization program which includes automated exposure control, adjustment of the mA and/or kV according to patient size and/or use of iterative reconstruction technique. CONTRAST:  75mL OMNIPAQUE IOHEXOL 350 MG/ML SOLN COMPARISON:  CT of the abdomen pelvis dated 01/10/2022. FINDINGS: Evaluation is limited due to streak artifact caused by right hip arthroplasty. CTA CHEST FINDINGS Cardiovascular: There is no cardiomegaly or pericardial effusion. Three-vessel coronary vascular calcification and calcification of the mitral annulus. Advanced atherosclerotic calcification of the thoracic aorta. No aneurysmal dilatation or dissection. The origins of the great vessels of the aortic arch appear patent. No pulmonary artery embolus identified. Mediastinum/Nodes: No hilar or mediastinal adenopathy. The esophagus is grossly unremarkable. No mediastinal fluid collection. Lungs/Pleura: Bibasilar linear atelectasis/scarring. No focal consolidation, pleural effusion, or pneumothorax. The central airways are patent. Musculoskeletal: No acute osseous pathology. Review of the MIP images confirms the above findings. CTA ABDOMEN AND PELVIS FINDINGS VASCULAR Aorta: Advanced aortoiliac atherosclerotic disease. Status post prior aorto bi iliac endovascular stent graft repair of infrarenal abdominal aortic  aneurysm. The stent graft appears patent. No contrast noted outside of the confines of the stent graft. No periaortic fluid collection. No significant interval change in the size of the excluded aneurysm sac. 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 The celiac artery remains patent. SMA: Advanced atherosclerotic calcification and high-grade stenosis of the origin of the SMA. The SMA appears patent. Renals: There is atherosclerotic calcification of the origins of the renal arteries. The left renal artery is widely patent. There is narrowing of the right renal artery with diminished flow likely secondary to high-grade stenosis at the origin. This is similar to prior CT. IMA: The IMA is not visualized and appears occluded. Inflow: Advanced atherosclerotic calcification of the iliac arteries. No aneurysmal dilatation or dissection. The external iliac arteries appear patent. There is diminished flow in the internal iliac arteries. Veins: No obvious venous abnormality within the limitations of this arterial phase study. Review of the MIP images confirms the above findings. NON-VASCULAR No intra-abdominal free air or free fluid. Hepatobiliary: The liver is unremarkable. No biliary dilatation. Layering small stones in the gallbladder. No pericholecystic fluid or evidence of acute cholecystitis by CT. Pancreas: Unremarkable. No pancreatic ductal dilatation or surrounding inflammatory changes. Spleen: Normal in size without focal abnormality. Adrenals/Urinary Tract: The adrenal glands unremarkable. Moderate to severe right renal parenchyma atrophy. Multiple left renal cysts and smaller hypodense lesions which are too small to characterize. There is no hydronephrosis. The visualized ureters and urinary bladder appear unremarkable. Stomach/Bowel: There is sigmoid diverticulosis. No bowel obstruction or active inflammation. The appendix is normal. Lymphatic: No  adenopathy. Reproductive: Calcified uterine fibroid. A 4.3 x 3.0 cm ovoid lesion in the right hemipelvis is not characterized but likely an exophytic and partially calcified fibroid. Ultrasound may provide better evaluation of the pelvic structures. This is however similar to prior CT. Other: None Musculoskeletal: Total right hip arthroplasty. Osteopenia with degenerative changes of the spine. No acute osseous pathology. Review of the MIP images confirms the above findings. IMPRESSION: 1. No acute intrathoracic, abdominal, or pelvic pathology. 2. Status post prior aorto bi iliac endovascular stent graft repair of infrarenal abdominal aortic aneurysm. The stent graft appears patent. No periaortic fluid collection. No significant interval change in the size of the excluded aneurysm sac. 3. Sigmoid diverticulosis. No bowel obstruction. Normal appendix. 4. Cholelithiasis. 5. Moderate to severe right renal  parenchyma atrophy. 6.  Aortic Atherosclerosis (ICD10-I70.0). Electronically Signed   By: Elgie Collard M.D.   On: 06/13/2023 16:36     CLL (chronic lymphocytic leukemia) (HCC) # CLL-89 yo with Hx of chronic lymphocytosis JAn 2025- admitted to hospital for syncope- work positive for bacteremia-   # Chronic lymphocytosis-DDx: likely CLL / low grade leukemia. 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  As per history of chronic leukemia-mild anemia mild leukocytosis and thrombocytopenia-recommend monitoring for now especially context of ongoing acute issues.   #Abdominal pain with nausea- on Protonix [Hx bacteremia- extensive work up including- PET scan; s/p ID evaluation negative for obvious source. S/p EGD-duodenitis- /colonoscopy- polyps-otherwise no obvious etiology noted. Possible mesenteric ischemia. Discussed with Dr. Gerri Lins further evaluation  emergency room.  Patient reluctantly agreed.   # Hx of A.fib- on Eliquis  # DISPOSITION: # recommend ER  evaluation re; Abdominal pain- Dr.B   Above plan of care was discussed with patient/family in detail.  My contact information was given to the patient/family.     Earna Coder, MD 07/12/2023 2:40 PM

## 2023-07-12 NOTE — Progress Notes (Signed)
 Pt taken to ED via wheelchair. Pt has her rollator with her also. Pt feeling dizzy. Having [place] vomiting and abdominal pain. Unrelated to her CLL.

## 2023-07-12 NOTE — Sepsis Progress Note (Signed)
 Elink monitoring for the code sepsis protocol.

## 2023-07-12 NOTE — ED Triage Notes (Signed)
 First Nurse Note: Patient to ED from the cancer center for N/V/D with abd pain. States she is from [facility].

## 2023-07-12 NOTE — ED Notes (Signed)
 Due to pt being ambulatory and from independent living this RN had discharged pt to lobby to await transportation. Charge RN advised RN to return pt to room to await transportation due to pt being high fall risk and technically being from a facility. Due to pt being from facility, there was a miscommunication that EMS would provide transportation - not appropriate due to pt ambulating independently. EMS transportation request was cancelled. Spoke at length with pt to update on transportation status and return pt to room. Pt stating [facility] should provide transportation and she should not have to be waiting. Advised pt that nursing staff have no control over [facility]' policies but pt continues to be argumentative. Attempted to redirect pt multiple times. Pt will not return to room at this time. [facility] security called to provide transportation home for pt. Security states will send someone whenever they can - no eta given. Charge RN made aware.

## 2023-07-12 NOTE — Assessment & Plan Note (Addendum)
#   CLL-89 yo with Hx of chronic lymphocytosis JAn 2025- admitted to hospital for syncope- work positive for bacteremia-   # Chronic lymphocytosis-DDx: likely CLL / low grade leukemia. 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  As per history of chronic leukemia-mild anemia mild leukocytosis and thrombocytopenia-recommend monitoring for now especially context of ongoing acute issues.   #Abdominal pain with nausea- on Protonix [Hx bacteremia- extensive work up including- PET scan; s/p ID evaluation negative for obvious source. S/p EGD-duodenitis- /colonoscopy- polyps-otherwise no obvious etiology noted. Possible mesenteric ischemia. Discussed with Dr. Gerri Lins further evaluation emergency room.  Patient reluctantly agreed.   # Hx of A.fib- on Eliquis  # DISPOSITION: # recommend ER  evaluation re; Abdominal pain- Dr.B

## 2023-07-12 NOTE — Progress Notes (Signed)
 CODE SEPSIS - PHARMACY COMMUNICATION  **Broad Spectrum Antibiotics should be administered within 1 hour of Sepsis diagnosis**  Time Code Sepsis Called/Page Received: 1622  Antibiotics Ordered: ceftriaxone, metronidazole  Time of 1st antibiotic administration: 1648  Additional action taken by pharmacy: N/A  If necessary, Name of Provider/Nurse Contacted: N/A    Merryl Hacker ,PharmD Clinical Pharmacist  07/12/2023  4:23 PM

## 2023-07-12 NOTE — ED Triage Notes (Signed)
 Patient states lower abdominal pain, N/V/D since last Tuesday.

## 2023-07-12 NOTE — Progress Notes (Signed)
 Pt came to office today vomiting.  Pain 7/10.

## 2023-07-13 ENCOUNTER — Ambulatory Visit: Payer: Medicare Other | Admitting: Student

## 2023-07-13 ENCOUNTER — Telehealth: Payer: Self-pay | Admitting: *Deleted

## 2023-07-13 ENCOUNTER — Encounter: Payer: Self-pay | Admitting: Student

## 2023-07-13 VITALS — BP 136/66 | HR 69 | Temp 98.2°F | Ht 64.0 in | Wt 139.0 lb

## 2023-07-13 DIAGNOSIS — R062 Wheezing: Secondary | ICD-10-CM

## 2023-07-13 DIAGNOSIS — K5900 Constipation, unspecified: Secondary | ICD-10-CM | POA: Diagnosis not present

## 2023-07-13 DIAGNOSIS — C9110 Chronic lymphocytic leukemia of B-cell type not having achieved remission: Secondary | ICD-10-CM

## 2023-07-13 DIAGNOSIS — I5032 Chronic diastolic (congestive) heart failure: Secondary | ICD-10-CM | POA: Diagnosis not present

## 2023-07-13 DIAGNOSIS — Z09 Encounter for follow-up examination after completed treatment for conditions other than malignant neoplasm: Secondary | ICD-10-CM

## 2023-07-13 DIAGNOSIS — F03B0 Unspecified dementia, moderate, without behavioral disturbance, psychotic disturbance, mood disturbance, and anxiety: Secondary | ICD-10-CM

## 2023-07-13 NOTE — Transitions of Care (Post Inpatient/ED Visit) (Signed)
 07/13/2023  Name: Tiffany Martinez MRN: [id number] DOB: February 12, 1934  Today's TOC FU Call Status: Today's TOC FU Call Status:: Successful TOC FU Call Completed TOC FU Call Complete Date: 07/13/23 Patient's Name and Date of Birth confirmed.  Transition Care Management Follow-up Telephone Call Date of Discharge: 07/13/23 Discharge Facility: [facility]) Type of Discharge: Emergency Department Primary Inpatient Discharge Diagnosis:: Nausea Reason for ED Visit: Other: How have you been since you were released from the hospital?: Same Any questions or concerns?: No  Items Reviewed: Did you receive and understand the discharge instructions provided?: Yes Medications obtained,verified, and reconciled?: Yes (Medications Reviewed) Any new allergies since your discharge?: No Dietary orders reviewed?: Yes Type of Diet Ordered:: Bland Diet Do you have support at home?: Yes People in Home: facility resident Name of Support/Comfort Primary Source: [place].  Medications Reviewed Today: Medications Reviewed Today     Reviewed by Shane Badeaux A, CMA (Certified Medical Assistant) on 07/13/23 at 1014  Med List Status: <None>   Medication Order Taking? Sig Documenting Provider Last Dose Status Informant  acetaminophen (TYLENOL) 325 MG tablet [id number] Yes Take 2 tablets (650 mg total) by mouth every 6 (six) hours as needed for mild pain (pain score 1-3) (or Fever >/= 101). Leeroy Bock, MD Taking Active   amLODipine (NORVASC) 5 MG tablet [id number] Yes Take 1 tablet (5 mg total) by mouth daily. Earnestine Mealing, MD Taking Active Self, Pharmacy Records  apixaban [facility]) 2.5 MG TABS tablet [id number] Yes Take 1 tablet (2.5 mg total) by mouth 2 (two) times daily. Marrion Coy, MD Taking Active   cloNIDine (CATAPRES - DOSED IN MG/24 HR) 0.1 mg/24hr patch [id number] Yes Place 1 patch (0.1 mg total) onto the skin every 7 (seven) days. Marrion Coy, MD Taking Active    loratadine (CLARITIN) 10 MG tablet [id number] Yes Take 10 mg by mouth daily. [provider] Taking Active Self, Pharmacy Records  losartan (COZAAR) 100 MG tablet [id number] Yes Take 1 tablet (100 mg total) by mouth daily. Earnestine Mealing, MD Taking Active Self, Pharmacy Records  Multiple Vitamin (MULTIVITAMIN ADULT PO) [id number] Yes Take 1 tablet by mouth daily. [provider] Taking Active Self, Pharmacy Records  ondansetron (ZOFRAN-ODT) 8 MG disintegrating tablet [id number] Yes Take 1 tablet (8 mg total) by mouth every 8 (eight) hours as needed for nausea or vomiting. Merwyn Katos, MD Taking Active   pantoprazole (PROTONIX) 40 MG tablet [id number] Yes Take 1 tablet (40 mg total) by mouth 2 (two) times daily. Marrion Coy, MD Taking Active   Med List Note Sharia Reeve, CPhT 10/09/19 1447): Resident of [facility] and Equipment/Supplies: Were Home Health Services Ordered?: No Any new equipment or medical supplies ordered?: No  Functional Questionnaire: Do you need assistance with bathing/showering or dressing?: No Do you need assistance with meal preparation?: No Do you need assistance with eating?: No Do you have difficulty maintaining continence: No Do you need assistance with getting out of bed/getting out of a chair/moving?: No Do you have difficulty managing or taking your medications?: No  Follow up appointments reviewed: PCP Follow-up appointment confirmed?: Yes Date of PCP follow-up appointment?: 07/13/23 Follow-up Provider: Dr. Benetta Spar [facility] Follow-up appointment confirmed?: NA Do you need transportation to your follow-up appointment?: Yes Transportation Need Intervention Addressed By:: Transportation Arranged Do you understand care options if your condition(s) worsen?: Yes-patient verbalized understanding  SDOH Interventions Today  Flowsheet Row Most Recent Value  SDOH Interventions   Food  Insecurity Interventions Intervention Not Indicated  Housing Interventions Intervention Not Indicated  Transportation Interventions Intervention Not Indicated  Utilities Interventions Intervention Not Indicated       SIGNATURE Synetta Fail Chade Pitner, CMA

## 2023-07-13 NOTE — Progress Notes (Unsigned)
 Location:  [facility] Place of Service:  [facility] Provider: Sydnee Cabal  Code Status: DNR Goals of Care:     07/12/2023    3:24 PM  Advanced Directives  Does Patient Have a Medical Advance Directive? No  Does patient want to make changes to medical advance directive? No - Patient declined  Would patient like information on creating a medical advance directive? No - Patient declined     Chief Complaint  Patient presents with   Transitions Of Care    TOC. ER Follow up    HPI: Patient is a 89 y.o. female seen today for hospital follow-up s/p admission from  Discussed the use of AI scribe software for clinical note transcription with the patient, who gave verbal consent to proceed.  History of Present Illness   Tiffany Martinez is an 89 year old female who presents for ED follow up.   She was seen yesterday in the emergency department for abdominal pain and vomiting. Hematology appointment was cut short due to her symptoms, and she was sent to the hospital. She has been experiencing abdominal pain that began last week, initially manageable, but worsened on Monday, leading to vomiting. The pain's severity prompted her to seek emergency care. Since the emergency department visit, she has not experienced further vomiting but has had a reduced appetite.  She has a history of constipation, with infrequent and 'firmly formed' bowel movements. A CT scan in the emergency department showed stool in the sigmoid colon but no diverticulitis.  She has a history of elevated white blood cell count and previous concerns about leukemia, though recent tests have not confirmed cancer. Her hemoglobin levels have been stable, and she is awaiting final results from recent blood cultures to confirm the clearance of a previous infection.  No fever. The pain was severe enough to induce vomiting. No further vomiting since the emergency department visit.        Past Medical History:  Diagnosis Date    AAA (abdominal aortic aneurysm) (HCC) 2012   3.9cm 2012; 2.8cm 12/2021   Anemia    Anxiety    CHF (congestive heart failure) (HCC)    LVEF 60-65% in 2023   Chronic venous insufficiency    CLL (chronic lymphocytic leukemia) (HCC) 2014   Stage 0--by flow cytometry   Hx of colonoscopy    Hyperlipidemia    Hypertension    Melanoma (HCC)    Right Arm   Migraine headache    3-4X/yr   PAD (peripheral artery disease) (HCC) 2012   severe R common iliac stenosis, ABI 0.73, mid L common iliac stenosis ABI 1.1   PAD (peripheral artery disease) (HCC)    Paroxysmal atrial fibrillation (HCC) 12/2021   Wears dentures    full upper and lower    Past Surgical History:  Procedure Laterality Date   ABDOMINAL AORTIC ANEURYSM REPAIR  02/16/12   [facility]--Dr Pattricia Boss   BIOPSY  06/26/2023   Procedure: BIOPSY;  Surgeon: Jaynie Collins, DO;  Location: [facility] ENDOSCOPY;  Service: Gastroenterology;;   CARDIOVASCULAR STRESS TEST  2008   negative   CATARACT EXTRACTION W/PHACO Left 07/16/2019   Procedure: CATARACT EXTRACTION PHACO AND INTRAOCULAR LENS PLACEMENT (IOC) LEFT 8.26  00:46.6;  Surgeon: Nevada Crane, MD;  Location: [facility];  Service: Ophthalmology;  Laterality: Left;   COLONOSCOPY WITH PROPOFOL N/A 06/26/2023   Procedure: COLONOSCOPY WITH PROPOFOL;  Surgeon: Jaynie Collins, DO;  Location: [facility] ENDOSCOPY;  Service: Gastroenterology;  Laterality: N/A;   DOBUTAMINE STRESS ECHO  11/11   negative   ESOPHAGOGASTRODUODENOSCOPY (EGD) WITH PROPOFOL N/A 06/26/2023   Procedure: ESOPHAGOGASTRODUODENOSCOPY (EGD) WITH PROPOFOL;  Surgeon: Jaynie Collins, DO;  Location: [facility] ENDOSCOPY;  Service: Gastroenterology;  Laterality: N/A;   HIP ARTHROPLASTY Right 10/10/2019   Procedure: ARTHROPLASTY BIPOLAR HIP (HEMIARTHROPLASTY);  Surgeon: Christena Flake, MD;  Location: [facility] ORS;  Service: Orthopedics;  Laterality: Right;   HOT HEMOSTASIS  06/26/2023   Procedure: HOT HEMOSTASIS (ARGON PLASMA  COAGULATION/BICAP);  Surgeon: Jaynie Collins, DO;  Location: [facility] ENDOSCOPY;  Service: Gastroenterology;;   KNEE ARTHROSCOPY  1998   right   MELANOMA EXCISION  11/09/10   wide excision right upper arm   SQUAMOUS CELL CARCINOMA EXCISION  02/2016   TEE WITHOUT CARDIOVERSION N/A 06/15/2023   Procedure: TRANSESOPHAGEAL ECHOCARDIOGRAM (TEE);  Surgeon: Antonieta Iba, MD;  Location: [facility] ORS;  Service: Cardiovascular;  Laterality: N/A;   TONSILLECTOMY AND ADENOIDECTOMY  1960    Allergies  Allergen Reactions   Triamterene Hives    Outpatient Encounter Medications as of 07/13/2023  Medication Sig   acetaminophen (TYLENOL) 325 MG tablet Take 2 tablets (650 mg total) by mouth every 6 (six) hours as needed for mild pain (pain score 1-3) (or Fever >/= 101).   amLODipine (NORVASC) 5 MG tablet Take 1 tablet (5 mg total) by mouth daily.   apixaban (ELIQUIS) 2.5 MG TABS tablet Take 1 tablet (2.5 mg total) by mouth 2 (two) times daily.   cloNIDine (CATAPRES - DOSED IN MG/24 HR) 0.1 mg/24hr patch Place 1 patch (0.1 mg total) onto the skin every 7 (seven) days.   loratadine (CLARITIN) 10 MG tablet Take 10 mg by mouth daily.   losartan (COZAAR) 100 MG tablet Take 1 tablet (100 mg total) by mouth daily.   Multiple Vitamin (MULTIVITAMIN ADULT PO) Take 1 tablet by mouth daily.   ondansetron (ZOFRAN-ODT) 8 MG disintegrating tablet Take 1 tablet (8 mg total) by mouth every 8 (eight) hours as needed for nausea or vomiting.   pantoprazole (PROTONIX) 40 MG tablet Take 1 tablet (40 mg total) by mouth 2 (two) times daily.   No facility-administered encounter medications on file as of 07/13/2023.    Review of Systems:  Review of Systems  Health Maintenance  Topic Date Due   COVID-19 Vaccine (5 - 2024-25 season) 07/20/2024 (Originally 01/16/2023)   Zoster Vaccines- Shingrix (1 of 2) 09/28/2024 (Originally 03/04/1953)   Medicare Annual Wellness (AWV)  01/13/2024   DTaP/Tdap/Td (3 - Tdap) 01/16/2024    Pneumonia Vaccine 61+ Years old  Completed   INFLUENZA VACCINE  Completed   DEXA SCAN  Completed   HPV VACCINES  Aged Out    Physical Exam: Vitals:   07/13/23 1415  BP: 136/66  Pulse: 69  Temp: 98.2 F (36.8 C)  SpO2: 91%  Weight: 139 lb (63 kg)  Height: 5\' 4"  (1.626 m)   Body mass index is 23.86 kg/m. Physical Exam Physical Exam   General: Alert, well-appearing CV: RRR Lungs: Bilateral expiratory wheezing      Labs reviewed: Basic Metabolic Panel: Recent Labs    06/13/23 0500 06/14/23 0351 06/15/23 0332 06/16/23 0427 06/22/23 1906 06/26/23 0452 07/11/23 0802 07/12/23 1525  NA 140 138 139 140   < > 138 139 137  K 3.4* 3.5 3.6 3.4*   < > 3.6 3.8 4.0  CL 112* 108 106 111   < > 109 108 106  CO2 19* 23 22 22    < >  21* 27 21*  GLUCOSE 99 104* 94 92   < > 87 95 130*  BUN 24* 20 28* 27*   < > 17 22 32*  CREATININE 1.39* 1.34* 1.65* 1.42*   < > 1.39* 1.60* 1.58*  CALCIUM 9.5 9.3 9.3 9.2   < > 9.3 9.8 9.9  MG 1.9 2.1 2.1 2.1  --   --   --   --   PHOS 2.7  --   --   --   --   --   --   --    < > = values in this interval not displayed.   Liver Function Tests: Recent Labs    06/11/23 1500 06/22/23 1906 07/11/23 0802 07/12/23 1525  AST 27 24 18 22   ALT 15 17 11 15   ALKPHOS 68 56  --  64  BILITOT 1.0 0.6 0.5 0.6  PROT 6.7 6.0* 5.6* 6.4*  ALBUMIN 4.0 3.6  --  3.7   Recent Labs    06/22/23 1906 07/12/23 1525  LIPASE 43 42   No results for input(s): "AMMONIA" in the last 8760 hours. CBC: Recent Labs    06/22/23 1906 06/23/23 0554 06/26/23 0452 07/11/23 0802 07/12/23 1525  WBC 29.3*   < > 14.2* 17.5* 21.5*  21.0*  NEUTROABS 6.0  --   --  3,203 7.0  HGB 10.3*   < > 9.7* 10.3* 10.0*  10.0*  HCT 28.8*   < > 28.3* 31.2* 30.1*  29.5*  MCV 112.1*   < > 104.0* 108.0* 109.5*  109.7*  PLT 146*   < > 105* 119* 145*  145*   < > = values in this interval not displayed.   Lipid Panel: Recent Labs    07/11/23 0802  CHOL 172  HDL 43*  LDLCALC 107*   TRIG 120  CHOLHDL 4.0   Lab Results  Component Value Date   HGBA1C 5.4 08/02/2022   Results   LABS Blood culture: No growth for 12 hours (07/12/2023) WBC: 21 (07/12/2023) Hb: 10 (07/12/2023)  RADIOLOGY CT scan: No diverticulitis, stool in sigmoid colon (07/12/2023)      Procedures since last visit: CT ABDOMEN PELVIS WO CONTRAST Result Date: 07/12/2023 CLINICAL DATA:  Left lower quadrant abdominal pain, nausea, vomiting and diarrhea. EXAM: CT ABDOMEN AND PELVIS WITHOUT CONTRAST TECHNIQUE: Multidetector CT imaging of the abdomen and pelvis was performed following the standard protocol without IV contrast. RADIATION DOSE REDUCTION: This exam was performed according to the departmental dose-optimization program which includes automated exposure control, adjustment of the mA and/or kV according to patient size and/or use of iterative reconstruction technique. COMPARISON:  06/22/2023 FINDINGS: Lower chest: Borderline enlarged heart. Mild right basilar and minimal left basilar linear atelectasis or scarring. Hepatobiliary: Multiple small gallstones in the dependent portion of the gallbladder, measuring up to 3 mm in maximum diameter each. There is also sludge in the gallbladder. No gallbladder wall thickening or pericholecystic fluid. Pancreas: Unremarkable. No pancreatic ductal dilatation or surrounding inflammatory changes. Spleen: Normal in size without focal abnormality. Adrenals/Urinary Tract: Unremarkable adrenal glands. Stable moderate to marked right renal atrophy. Previously demonstrated small bilateral simple appearing renal cysts not needing imaging follow-up. Unremarkable ureters and urinary bladder. Stomach/Bowel: Mild colonic diverticulosis without evidence of diverticulitis. Stable prominent stool in the sigmoid colon. Interval small appendicoliths without evidence of appendicitis. Unremarkable stomach and small bowel. Vascular/Lymphatic: Atheromatous arterial calcifications without  aneurysm. No enlarged lymph nodes. Stable aorto bi-iliac stent graft. Reproductive: No significant change in partially calcified  uterine fibroids. No adnexal mass Other: No abdominal wall hernia or abnormality. No abdominopelvic ascites. Musculoskeletal: Stable right hip prosthesis. Lumbar spine degenerative changes. IMPRESSION: 1. No acute abnormality. 2. Mild colonic diverticulosis without evidence of diverticulitis. 3. Stable prominent stool in the sigmoid colon. 4. Cholelithiasis and gallbladder sludge without evidence of cholecystitis. 5. Stable moderate to marked right renal atrophy. 6. Stable uterine fibroids. Electronically Signed   By: Beckie Salts M.D.   On: 07/12/2023 18:55   DG Chest Port 1 View Result Date: 07/12/2023 CLINICAL DATA:  Possible sepsis. EXAM: PORTABLE CHEST 1 VIEW COMPARISON:  06/11/2023 FINDINGS: Stable normal sized heart and tortuous and calcified thoracic aorta. The lungs remain mildly hyperexpanded and clear with normal vascularity. Diffuse osteopenia. IMPRESSION: 1. No acute abnormality. 2. COPD. 3. Diffuse osteopenia. Electronically Signed   By: Beckie Salts M.D.   On: 07/12/2023 17:48    Assessment/Plan There are no diagnoses linked to this encounter. Assessment and Plan    Abdominal Pain and Constipation Presented with severe abdominal pain and vomiting, leading to an ED visit. Diagnosed with constipation likely due to low fluid intake and restrictive diet. CT scan showed stool in the sigmoid colon, no diverticulitis. Treated with IV fluids. No further vomiting since ED visit. Risks include recurrent constipation and potential bowel obstruction. Benefits of increased dietary intake include improved bowel regularity and nutritional status. - Increase fluid intake - Increase dietary intake of proteins, fats, and calories - Consider MiraLAX for constipation if needed - Ensure regular bowel movements  CLL  White blood cell counts elevated to 21, possibly related to  recent vomiting and potential infection. Hemoglobin well-managed. Concerns about leukemia, but previous tests have not confirmed malignancy. Awaiting final blood culture results to rule out infection. Risks include potential undiagnosed infection or hematologic malignancy. Benefits of monitoring include early detection and management of underlying conditions. - Monitor white blood cell count - Await final blood culture results  General Health Maintenance Advised to maintain a balanced diet with adequate fiber and stay active to prevent constipation and promote regular bowel movements. - Maintain a balanced diet with adequate fiber - Stay active   Wheezing  Tobacco Dependence Patient has longstanding tobacco use a typically has deep breathing exercises to help get deep breaths. We have discussed initiating therapy with inhalers on multiple occasions. Declines initiation of therapy today. ED precautions provided. Will continue conversation regarding PFTs, pulmonology, and medication options.   Follow-up - Follow up with infectious disease specialist on February 20th - Await final blood culture results by Friday.       abs/tests ordered:  * No order type specified * Next appt:  08/10/2023

## 2023-07-13 NOTE — Patient Instructions (Signed)
 VISIT SUMMARY:  Tiffany Martinez, an 89 year old female, visited [place] today due to severe abdominal pain and vomiting that began last week. She has a history of constipation and elevated white blood cell count. A recent CT scan showed stool in the sigmoid colon but no diverticulitis. She has not vomited since her emergency department visit but has a reduced appetite.  YOUR PLAN:  -ABDOMINAL PAIN AND CONSTIPATION: You have been diagnosed with constipation, likely due to low fluid intake and a restrictive diet. This condition can cause abdominal pain and vomiting. To manage this, you should increase your fluid intake and dietary intake of proteins, fats, and calories. You may also consider using MiraLAX if needed to help with constipation and ensure regular bowel movements.  -LEUKOCYTOSIS: Your white blood cell count is elevated, which could be related to recent vomiting or a potential infection. Leukocytosis means having a higher than normal white blood cell count. We will monitor your white blood cell count and await the final blood culture results to rule out any infection.  -GENERAL HEALTH MAINTENANCE: To maintain your overall health, it is important to have a balanced diet with adequate fiber and stay active. This will help prevent constipation and promote regular bowel movements.  INSTRUCTIONS:  Please follow up with the infectious disease specialist on February 20th. We are also awaiting the final blood culture results, which should be available by Friday.

## 2023-07-15 ENCOUNTER — Encounter: Payer: Self-pay | Admitting: Student

## 2023-07-17 LAB — CULTURE, BLOOD (ROUTINE X 2)
Culture: NO GROWTH
Culture: NO GROWTH

## 2023-07-17 LAB — CULTURE, BLOOD (SINGLE)
MICRO NUMBER:: 16119697
Result:: NO GROWTH
SPECIMEN QUALITY:: ADEQUATE

## 2023-07-22 ENCOUNTER — Ambulatory Visit
Admission: RE | Admit: 2023-07-22 | Discharge: 2023-07-22 | Disposition: A | Discharge status: Home / Self Care | Payer: Medicare Other | Source: Ambulatory Visit | Attending: Nurse Practitioner | Admitting: Nurse Practitioner

## 2023-07-22 DIAGNOSIS — Z780 Asymptomatic menopausal state: Secondary | ICD-10-CM | POA: Diagnosis not present

## 2023-07-22 DIAGNOSIS — E2839 Other primary ovarian failure: Secondary | ICD-10-CM | POA: Insufficient documentation

## 2023-07-22 DIAGNOSIS — M810 Age-related osteoporosis without current pathological fracture: Secondary | ICD-10-CM | POA: Diagnosis not present

## 2023-07-27 ENCOUNTER — Ambulatory Visit: Payer: Medicare Other | Attending: Student | Admitting: Student

## 2023-07-27 ENCOUNTER — Encounter: Payer: Self-pay | Admitting: Student

## 2023-07-27 VITALS — BP 136/66 | HR 81 | Ht 65.0 in | Wt 134.6 lb

## 2023-07-27 DIAGNOSIS — I480 Paroxysmal atrial fibrillation: Secondary | ICD-10-CM

## 2023-07-27 DIAGNOSIS — I5032 Chronic diastolic (congestive) heart failure: Secondary | ICD-10-CM | POA: Diagnosis not present

## 2023-07-27 DIAGNOSIS — I7143 Infrarenal abdominal aortic aneurysm, without rupture: Secondary | ICD-10-CM | POA: Diagnosis not present

## 2023-07-27 DIAGNOSIS — I739 Peripheral vascular disease, unspecified: Secondary | ICD-10-CM | POA: Diagnosis not present

## 2023-07-27 DIAGNOSIS — K559 Vascular disorder of intestine, unspecified: Secondary | ICD-10-CM | POA: Diagnosis not present

## 2023-07-27 DIAGNOSIS — I10 Essential (primary) hypertension: Secondary | ICD-10-CM | POA: Diagnosis not present

## 2023-07-27 DIAGNOSIS — Z8679 Personal history of other diseases of the circulatory system: Secondary | ICD-10-CM | POA: Diagnosis not present

## 2023-07-27 DIAGNOSIS — Z9889 Other specified postprocedural states: Secondary | ICD-10-CM

## 2023-07-27 MED ORDER — CLONIDINE HCL 0.1 MG/24HR TD PTWK: 0.1000 mg | MEDICATED_PATCH | TRANSDERMAL | 11 refills | Status: DC

## 2023-07-27 MED ORDER — METOPROLOL SUCCINATE ER 25 MG PO TB24: 12.5000 mg | ORAL_TABLET | Freq: Every day | ORAL | 3 refills | Status: AC

## 2023-07-27 MED ORDER — CLONIDINE 0.1 MG/24HR TD PTWK: 0.1000 mg | MEDICATED_PATCH | TRANSDERMAL | 11 refills | Status: DC

## 2023-07-27 MED ORDER — METOPROLOL SUCCINATE ER 25 MG PO TB24: 12.5000 mg | ORAL_TABLET | Freq: Every day | ORAL | 3 refills | Status: DC

## 2023-07-27 NOTE — Patient Instructions (Signed)
 Medication Instructions:  Your physician recommends the following medication changes.  START TAKING: Toprol-XL 12.5 mg nightly  *If you need a refill on your cardiac medications before your next appointment, please call your pharmacy*   Lab Work: None ordered at this time    Follow-Up: At [facility], you and your health needs are our priority.  As part of our continuing mission to provide you with exceptional heart care, we have created designated Provider Care Teams.  These Care Teams include your primary Cardiologist (physician) and Advanced Practice Providers (APPs -  Physician Assistants and Nurse Practitioners) who all work together to provide you with the care you need, when you need it.  We recommend signing up for the patient portal called "MyChart".  Sign up information is provided on this After Visit Summary.  MyChart is used to connect with patients for Virtual Visits (Telemedicine).  Patients are able to view lab/test results, encounter notes, upcoming appointments, etc.  Non-urgent messages can be sent to your provider as well.   To learn more about what you can do with MyChart, go to [email or web address].    Your next appointment:   3 month(s)  Provider:   You may see Julien Nordmann, MD or one of the following Advanced Practice Providers on your designated Care Team:   Nicolasa Ducking, NP Eula Listen, PA-C Cadence Fransico Michael, PA-C Charlsie Quest, NP Carlos Levering, NP

## 2023-07-27 NOTE — Progress Notes (Signed)
 Cardiology Clinic Note   Date: 07/27/2023 ID: Tiffany Martinez, DOB 10/10/1933, MRN [id number]  Primary Cardiologist:  Julien Nordmann, MD  Chief Complaint   Tiffany Martinez is a 89 y.o. female who presents to the clinic today for hospital follow up.   Patient Profile   Tiffany Martinez is followed by Dr. Mariah Milling for the history outlined below.      Past medical history significant for: Chronic diastolic heart failure. Echo 06/12/2023: EF 50 to 55%.  LVH.  Grade II DD.  Normal RV size/function.  Moderate LAE. Concern for endocarditis. TEE 06/15/2023: No LA/LAA thrombus.  Mild to moderate MR/AI.  No evidence of interatrial shunt.  No valve vegetations noted. PAF. 14-day ZIO 01/06/2022: HR 41 to 154 bpm, average 66 bpm.  A-fib occurred <1% 55 to 101 bpm, average 70 bpm, longest 1 hour 47 minutes.  13 SVT runs fastest lasting 5 beats max rate 154 bpm longest 15 beats average 80 bpm.  Frequent PVCs 5% burden. Mesenteric ischemia. CTA abdomen pelvis 01/10/2022: > 90% stenosis origin of the celiac axis.  Mild stenosis proximal SMA.  9% stenosis origin of the left renal artery.  95% stenosis origin of right renal artery with interval moderate to marked right renal atrophy.  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 PAD. LEA ultrasound 12/21/2010: Severe right common iliac artery stenosis.  Mild left common iliac artery stenosis.  No evidence of significant infrainguinal arterial occlusive disease.  Right ABI in the mild range.  Left ABI is normal.  Incidental finding of saccular AAA 3.9 x 3.9 cm. CTA abdomen pelvis 01/10/2022: Extensive plaque formation causing severe stenosis of the origins of both common femoral arteries as well as the right femoral artery and right profunda femoris artery. AAA. AAA ultrasound 11/08/2011: Aneurysmal dilatation of the distal abdominal aorta with no significant change when compared to previous exam August 2012. S/p endovascular repair October 2013  performed at [facility]. CTA chest abdomen pelvis 06/13/2023: Status post aortobiiliac endovascular stent graft repair of infrarenal AAA.  Stent graft appears patent.  No periaortic fluid collection.  No significant interval change in the size of the excluded aneurysm sac. Hypertension. Hyperlipidemia. COPD. Dementia. CLL. CKD stage IIIb.  In summary, patient was initially seen by Dr. Excell Seltzer in August 2012 for PAD.  Lower extremity arterial ultrasound revealed severe right common iliac stenosis and no significant disease on the left.  There was an incidental finding of AAA.  Echo in May 2019 demonstrated EF 60 to 65%, no RWMA, Grade II DD mild AI, mild LAE, mildly increased PA pressure mmHg.  She established care with Dr. Mariah Milling in March 2021.  Patient was seen for an office visit in August 2023 after leaving ED AMA.  She reported she had waited 6 hours prior to any care and decided to leave after she had an episode of syncope at home.  At the time of her visit she reported worsening fatigue and shortness of breath over a few months exacerbating balance issues.  14-day ZIO revealed A-fib and runs of SVT as detailed above and she was started on Eliquis and Toprol.  Patient presented to the ED at the end of August 2023 with complaints of vomiting after eating and abdominal pain.  She underwent CTA abdomen pelvis as detailed above.  Images were reviewed by vascular surgery and given absence of ischemic findings she was referred back to her [facility] vascular surgeon.  She was discharged back to [facility].  Patient  was last seen in the office by Dr. Mariah Milling on 06/21/2022 for routine follow-up.  She continued to complain of abdominal pain.  Unfortunately she is not a good surgical candidate for open procedures and not amenable to PCI.  She is able to tolerate soft mechanical foods.  She was euvolemic at the time of her visit.  BP was elevated and it was recommended she monitor it at home.  Patient presented to the ED  on 06/10/2023 for syncopal event.  Patient reported she was getting her hair done when she passed out.  EMS found her heart rate in the 40s.  She was discharged home.  She returned to the ED the following day to follow-up on positive blood cultures.  Initial labs on 06/10/2023: WBC 29.3, hemoglobin 11.5, sodium 139, potassium 4.8, creatinine 1.55, BUN 36, BNP 260.6, negative respiratory panel, troponin negative x 2.  She underwent TEE which demonstrated no valve vegetations as detailed above.  She was discharged on 06/16/2023.  Patient presented to the ED on 06/22/2023 with complaints of abdominal pain, nausea, vomiting, diarrhea and dark stool.  Initial labs: WBC 29.3, hemoglobin 10.3, sodium 141, potassium 4.5, creatinine 1.63, BUN 27.  She was admitted for observation.  CT abdomen pelvis demonstrated layering stones or sludge in the gallbladder without evidence of acute cholecystitis.  Abdominal ultrasound demonstrated cholelithiasis without acute cholecystitis.  She underwent colonoscopy/EGD which demonstrated gastritis and several polyps were removed.  Patient declined GI capsule study.  She was discharged on 06/26/2023.  Patient presented to the ED from the cancer center on 07/12/2023 for nausea, vomiting, diarrhea with abdominal pain.  She was treated with antiemetic and discharged home.     History of Present Illness    Today, patient reports overall she is doing well since hospital discharge. Patient denies shortness of breath, dyspnea on exertion, lower extremity edema, orthopnea or PND. No chest pain, pressure, or tightness. Her biggest complaint today is increased palpitations since her hospital discharge at the beginning of February. She typically feels palpitations at night and describes them as heart pounding. Discussed results of heart monitor that she wore in August 2023 and why she was taking Toprol to suppress the palpitations. She is not sure why Toprol was ever stopped. It appears to have  dropped her medication list after her hospital admission at the beginning of February correlating with her symptoms. Discussed recent TEE and echo in detail. All questions answered.     ROS: All other systems reviewed and are otherwise negative except as noted in History of Present Illness.  EKGs/Labs Reviewed    EKG Interpretation Date/Time:  Wednesday July 27 2023 11:19:02 EDT Ventricular Rate:  81 PR Interval:  194 QRS Duration:  88 QT Interval:  398 QTC Calculation: 462 R Axis:   -24  Text Interpretation: Normal sinus rhythm Left ventricular hypertrophy with repolarization abnormality ( R in aVL , Cornell product ) Anteroseptal infarct , age undetermined When compared with ECG of 12-Jul-2023 16:41, PREVIOUS ECG IS PRESENT Confirmed by Carlos Levering [phone]) on 07/27/2023 11:25:44 AM   07/12/2023: ALT 15; AST 22; BUN 32; Creatinine, Ser 1.58; Potassium 4.0; Sodium 137   07/12/2023: Hemoglobin 10.0; Hemoglobin 10.0; WBC 21.0; WBC 21.5   No results found for requested labs within last 365 days.   06/10/2023: B Natriuretic Peptide 260.6   Physical Exam    VS:  BP 136/66 (BP Location: Left Arm, Patient Position: Sitting, Cuff Size: Normal)   Pulse 81   Ht 5\' 5"  (  1.651 m)   Wt 134 lb 9.6 oz (61.1 kg)   LMP  (LMP Unknown)   SpO2 98%   BMI 22.40 kg/m  , BMI Body mass index is 22.4 kg/m.  GEN: Well nourished, well developed, in no acute distress. Neck: No JVD or carotid bruits. Cardiac:  RRR. No murmurs. No rubs or gallops.   Respiratory:  Respirations regular and unlabored. Clear to auscultation without rales, wheezing or rhonchi. GI: Soft, nontender, nondistended. Extremities: Radials/DP/PT 2+ and equal bilaterally. No clubbing or cyanosis. No edema.  Skin: Warm and dry, no rash. Neuro: Strength intact.  Assessment & Plan   Chronic diastolic heart failure Echo January 2025 with EF 50 to 55%, LVH, Grade II DD.  Patient denies lower extremity edema, dyspnea, orthopnea  or PND.  Euvolemic and well compensated on exam. -Continue losartan.  PAF 14-day ZIO August 2023 demonstrated A-fib <1% with longest run 1 hour 47 minutes.  Denies spontaneous bleeding concerns.  Patient reports increased palpitations since discharge from hospital at the end of February. It looks like this is when Toprol fell off her medication list. She describes palpitations as a pounding sensation. RRR on exam today.  -Restart Toprol 12.5 mg daily.  -Continue Eliquis. Appropriate Eliquis dose.  Mesenteric ischemia/PAD/AAA S/p endovascular repair of infrarenal AAA October 2013 performed at [facility].  Patient is followed by Dr. Pattricia Boss. -Continue management with Dr. Pattricia Boss.  Hypertension BP today 150/66 on intake and 136/66 on my recheck. She denies headaches or dizziness.  -Continue amlodipine, clonidine, losartan.  Disposition: Restart Toprol 12.5 mg daily. Return in 3 months or sooner as needed.          Signed, Etta Grandchild. Eithel Ryall, DNP, NP-C

## 2023-08-02 DIAGNOSIS — D649 Anemia, unspecified: Secondary | ICD-10-CM | POA: Diagnosis not present

## 2023-08-02 DIAGNOSIS — K5730 Diverticulosis of large intestine without perforation or abscess without bleeding: Secondary | ICD-10-CM | POA: Diagnosis not present

## 2023-08-02 DIAGNOSIS — C9110 Chronic lymphocytic leukemia of B-cell type not having achieved remission: Secondary | ICD-10-CM | POA: Diagnosis not present

## 2023-08-02 DIAGNOSIS — K8020 Calculus of gallbladder without cholecystitis without obstruction: Secondary | ICD-10-CM | POA: Diagnosis not present

## 2023-08-02 DIAGNOSIS — K5900 Constipation, unspecified: Secondary | ICD-10-CM | POA: Diagnosis not present

## 2023-08-02 DIAGNOSIS — Z7901 Long term (current) use of anticoagulants: Secondary | ICD-10-CM | POA: Diagnosis not present

## 2023-08-02 DIAGNOSIS — R109 Unspecified abdominal pain: Secondary | ICD-10-CM | POA: Diagnosis not present

## 2023-08-02 DIAGNOSIS — G8929 Other chronic pain: Secondary | ICD-10-CM | POA: Diagnosis not present

## 2023-08-02 DIAGNOSIS — D126 Benign neoplasm of colon, unspecified: Secondary | ICD-10-CM | POA: Diagnosis not present

## 2023-08-02 DIAGNOSIS — K551 Chronic vascular disorders of intestine: Secondary | ICD-10-CM | POA: Diagnosis not present

## 2023-08-02 DIAGNOSIS — K2970 Gastritis, unspecified, without bleeding: Secondary | ICD-10-CM | POA: Diagnosis not present

## 2023-08-02 DIAGNOSIS — I739 Peripheral vascular disease, unspecified: Secondary | ICD-10-CM | POA: Diagnosis not present

## 2023-08-04 ENCOUNTER — Ambulatory Visit: Payer: Medicare Other | Attending: Infectious Diseases | Admitting: Infectious Diseases

## 2023-08-04 ENCOUNTER — Encounter: Payer: Self-pay | Admitting: Infectious Diseases

## 2023-08-04 VITALS — BP 174/85 | HR 63 | Temp 98.2°F | Ht 64.0 in | Wt 135.0 lb

## 2023-08-04 DIAGNOSIS — K55019 Acute (reversible) ischemia of small intestine, extent unspecified: Secondary | ICD-10-CM | POA: Insufficient documentation

## 2023-08-04 DIAGNOSIS — Z79899 Other long term (current) drug therapy: Secondary | ICD-10-CM | POA: Insufficient documentation

## 2023-08-04 DIAGNOSIS — F17210 Nicotine dependence, cigarettes, uncomplicated: Secondary | ICD-10-CM | POA: Diagnosis not present

## 2023-08-04 DIAGNOSIS — K5900 Constipation, unspecified: Secondary | ICD-10-CM | POA: Diagnosis not present

## 2023-08-04 DIAGNOSIS — Z7901 Long term (current) use of anticoagulants: Secondary | ICD-10-CM | POA: Insufficient documentation

## 2023-08-04 DIAGNOSIS — Z8719 Personal history of other diseases of the digestive system: Secondary | ICD-10-CM | POA: Diagnosis not present

## 2023-08-04 DIAGNOSIS — I509 Heart failure, unspecified: Secondary | ICD-10-CM | POA: Insufficient documentation

## 2023-08-04 DIAGNOSIS — N189 Chronic kidney disease, unspecified: Secondary | ICD-10-CM | POA: Insufficient documentation

## 2023-08-04 DIAGNOSIS — I739 Peripheral vascular disease, unspecified: Secondary | ICD-10-CM | POA: Insufficient documentation

## 2023-08-04 DIAGNOSIS — I130 Hypertensive heart and chronic kidney disease with heart failure and stage 1 through stage 4 chronic kidney disease, or unspecified chronic kidney disease: Secondary | ICD-10-CM | POA: Diagnosis not present

## 2023-08-04 DIAGNOSIS — C9110 Chronic lymphocytic leukemia of B-cell type not having achieved remission: Secondary | ICD-10-CM | POA: Diagnosis not present

## 2023-08-04 DIAGNOSIS — R7881 Bacteremia: Secondary | ICD-10-CM | POA: Insufficient documentation

## 2023-08-04 DIAGNOSIS — I7143 Infrarenal abdominal aortic aneurysm, without rupture: Secondary | ICD-10-CM | POA: Insufficient documentation

## 2023-08-04 DIAGNOSIS — I480 Paroxysmal atrial fibrillation: Secondary | ICD-10-CM | POA: Insufficient documentation

## 2023-08-04 DIAGNOSIS — Z66 Do not resuscitate: Secondary | ICD-10-CM | POA: Insufficient documentation

## 2023-08-04 DIAGNOSIS — B9689 Other specified bacterial agents as the cause of diseases classified elsewhere: Secondary | ICD-10-CM | POA: Insufficient documentation

## 2023-08-04 DIAGNOSIS — Z95820 Peripheral vascular angioplasty status with implants and grafts: Secondary | ICD-10-CM | POA: Diagnosis not present

## 2023-08-04 DIAGNOSIS — K551 Chronic vascular disorders of intestine: Secondary | ICD-10-CM | POA: Insufficient documentation

## 2023-08-04 NOTE — Progress Notes (Signed)
 NAME: Tiffany Martinez  DOB: May 07, 1934  MRN: [id number]  Date/Time: 08/04/2023 11:00 AM   Subjective:   ?Pt agreed use of AI scribe  Alyssa Rotondo is a 89 y.o. with a history of CLL, hypertension, atrial fibrillation on Eliquis, CKD, chronic mesenteric ischemia, PAD, Patient has infrarenal abdominal aortic aneurysm and has an EVAR in October 2013 iliac stent graft extending from below the renal artery posterior to common iliac bifurcation the excluded infrarenal aneurysm sac measured in 2017 3.4 into 3.3 cm   presents for follow-up after hospitalization for Clostridium ramosum bacteremia.  She was admitted on June 10, 2023, with symptoms of abdominal pain, nausea, and vomiting. Blood cultures taken on two consecutive days were positive for Clostridium ramosum. Despite extensive testing, including CT scans, a PET scan, colonoscopy, and a transesophageal echocardiogram, no definitive source was identified. She completed a course of Augmentin, finishing around July 07, 2023, and subsequent blood cultures were negative for bacteria.  During her hospitalization, she was evaluated for abdominal pain, which was attributed to mesenteric ischemia. A CT scan revealed gallstones and diverticulosis without inflammation, and a small amount of stool in the sigmoid colon. She experiences ongoing abdominal pain, described as sharp and intermittent, starting in the epigastric area and radiating downward. The pain persists most of the day and is managed with hydrocodone. She maintains a light diet, primarily consisting of baked chicken and liquids, to manage her symptoms.  She has a history of chronic lymphocytic leukemia, which has been stable for years without the need for treatment. She also has a history of an aortic bi-iliac stent and graft, which was evaluated during her recent hospitalization and found to be stable.  Her current medications include acetaminophen, Fosamax, amlodipine, Eliquis,  atorvastatin, clonidine patch, hydralazine, losartan, pantoprazole, and furosemide. She feels weak and tired, attributing this to her restricted diet and high blood pressure. She experiences episodes of dizziness and breathlessness, which she associates with elevated blood pressure. No current abdominal pain, but she describes ongoing sharp, intermittent abdominal pain.  Past Medical History:  Diagnosis Date   AAA (abdominal aortic aneurysm) (HCC) 2012   3.9cm 2012; 2.8cm 12/2021   Anemia    Anxiety    CHF (congestive heart failure) (HCC)    LVEF 60-65% in 2023   Chronic venous insufficiency    CLL (chronic lymphocytic leukemia) (HCC) 2014   Stage 0--by flow cytometry   Hx of colonoscopy    Hyperlipidemia    Hypertension    Melanoma (HCC)    Right Arm   Migraine headache    3-4X/yr   PAD (peripheral artery disease) (HCC) 2012   severe R common iliac stenosis, ABI 0.73, mid L common iliac stenosis ABI 1.1   PAD (peripheral artery disease) (HCC)    Paroxysmal atrial fibrillation (HCC) 12/2021   Wears dentures    full upper and lower    Past Surgical History:  Procedure Laterality Date   ABDOMINAL AORTIC ANEURYSM REPAIR  02/16/12   [facility]--Dr Pattricia Boss   BIOPSY  06/26/2023   Procedure: BIOPSY;  Surgeon: Jaynie Collins, DO;  Location: [facility] ENDOSCOPY;  Service: Gastroenterology;;   CARDIOVASCULAR STRESS TEST  2008   negative   CATARACT EXTRACTION W/PHACO Left 07/16/2019   Procedure: CATARACT EXTRACTION PHACO AND INTRAOCULAR LENS PLACEMENT (IOC) LEFT 8.26  00:46.6;  Surgeon: Nevada Crane, MD;  Location: [facility];  Service: Ophthalmology;  Laterality: Left;   COLONOSCOPY WITH PROPOFOL N/A 06/26/2023   Procedure: COLONOSCOPY WITH PROPOFOL;  Surgeon:  Jaynie Collins, DO;  Location: [facility] END[facility]COPY;  Service: Gastroenterology;  Laterality: N/A;   DOBUTAMINE STRESS ECHO  11/11   negative   ESOPHAGOGASTRODUODENOSCOPY (EGD) WITH PROPOFOL N/A 06/26/2023   Procedure:  ESOPHAGOGASTRODUODENOSCOPY (EGD) WITH PROPOFOL;  Surgeon: Jaynie Collins, DO;  Location: [facility] ENDOSCOPY;  Service: Gastroenterology;  Laterality: N/A;   HIP ARTHROPLASTY Right 10/10/2019   Procedure: ARTHROPLASTY BIPOLAR HIP (HEMIARTHROPLASTY);  Surgeon: Christena Flake, MD;  Location: [facility] ORS;  Service: Orthopedics;  Laterality: Right;   HOT HEMOSTASIS  06/26/2023   Procedure: HOT HEMOSTASIS (ARGON PLASMA COAGULATION/BICAP);  Surgeon: Jaynie Collins, DO;  Location: [facility] ENDOSCOPY;  Service: Gastroenterology;;   KNEE ARTHROSCOPY  1998   right   MELANOMA EXCISION  11/09/10   wide excision right upper arm   SQUAMOUS CELL CARCINOMA EXCISION  02/2016   TEE WITHOUT CARDIOVERSION N/A 06/15/2023   Procedure: TRANSESOPHAGEAL ECHOCARDIOGRAM (TEE);  Surgeon: Antonieta Iba, MD;  Location: [facility] ORS;  Service: Cardiovascular;  Laterality: N/A;   TONSILLECTOMY AND ADENOIDECTOMY  1960    Social History   Socioeconomic History   Marital status: Widowed    Spouse name: Not on file   Number of children: 4   Years of education: Not on file   Highest education level: Not on file  Occupational History   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,   Tobacco Use   Smoking status: Every Day    Current packs/day: 0.00    Types: Cigarettes    Last attempt to quit: 03/17/2021    Years since quitting: 2.3    Passive exposure: Never   Smokeless tobacco: Never   Tobacco comments:    occassionally  Vaping Use   Vaping status: Never Used  Substance and Sexual Activity   Alcohol use: Yes    Alcohol/week: 7.0 standard drinks of alcohol    Types: 7 Glasses of wine per week    Comment: regular wine with dinner   Drug use: No   Sexual activity: Never  Other Topics Concern   Not on file  Social History Narrative   Widowed 2/11 -2nd for him , 1st for her. 4 step sons. 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         Has living will and health care POA--changing her POA at this  point   Requests DNR   Discussed MOST form---but would probably accept IV fluids/antibiotics, etc   Requests no feeding tube         Social Drivers of Health   Financial Resource Strain: Low Risk  (08/02/2023)   Received from [facility]   Overall Financial Resource Strain (CARDIA)    Difficulty of Paying Living Expenses: Not hard at all  Food Insecurity: No Food Insecurity (08/02/2023)   Received from [facility]   Hunger Vital Sign    Worried About Running Out of Food in the Last Year: Never true    Ran Out of Food in the Last Year: Never true  Transportation Needs: No Transportation Needs (08/02/2023)   Received from [facility] - Transportation    In the past 12 months, has lack of transportation kept you from medical appointments or from getting medications?: No    Lack of Transportation (Non-Medical): No  Physical Activity: Not on file  Stress: Not on file  Social Connections: Moderately Isolated (06/23/2023)   Social Connection and Isolation Panel [NHANES]    Frequency of  Communication with Friends and Family: More than three times a week    Frequency of Social Gatherings with Friends and Family: Twice a week    Attends Religious Services: 1 to 4 times per year    Active Member of [employer] or Organizations: No    Attends Banker Meetings: Never    Marital Status: Widowed  Intimate Partner Violence: Not At Risk (07/13/2023)   Humiliation, Afraid, Rape, and Kick questionnaire    Fear of Current or Ex-Partner: No    Emotionally Abused: No    Physically Abused: No    Sexually Abused: No    Family History  Problem Relation Age of Onset   Alzheimer's disease Mother    Pancreatic cancer Father    Parkinson's disease Brother    Coronary artery disease Neg Hx    Diabetes Neg Hx    Cancer Neg Hx        breast or colon   Allergies  Allergen Reactions   Triamterene Hives   I? Current Outpatient  Medications  Medication Sig Dispense Refill   amLODipine (NORVASC) 5 MG tablet Take 1 tablet (5 mg total) by mouth daily. 90 tablet 3   apixaban (ELIQUIS) 2.5 MG TABS tablet Take 1 tablet (2.5 mg total) by mouth 2 (two) times daily.     cloNIDine (CATAPRES - DOSED IN MG/24 HR) 0.1 mg/24hr patch Place 1 patch (0.1 mg total) onto the skin every 7 (seven) days. 4 patch 11   cloNIDine (CATAPRES - DOSED IN MG/24 HR) 0.1 mg/24hr patch Place 1 patch (0.1 mg total) onto the skin once a week. 4 patch 11   loratadine (CLARITIN) 10 MG tablet Take 10 mg by mouth daily.     losartan (COZAAR) 100 MG tablet Take 1 tablet (100 mg total) by mouth daily. 90 tablet 3   metoprolol succinate (TOPROL XL) 25 MG 24 hr tablet Take 0.5 tablets (12.5 mg total) by mouth daily. 45 tablet 3   Multiple Vitamin (MULTIVITAMIN ADULT PO) Take 1 tablet by mouth daily.     ondansetron (ZOFRAN-ODT) 8 MG disintegrating tablet Take 1 tablet (8 mg total) by mouth every 8 (eight) hours as needed for nausea or vomiting. 20 tablet 0   pantoprazole (PROTONIX) 40 MG tablet Take 1 tablet (40 mg total) by mouth 2 (two) times daily. 30 tablet 0   No current facility-administered medications for this visit.     Abtx:  Anti-infectives (From admission, onward)    None       REVIEW OF SYSTEMS:  Const: negative fever, negative chills, negative weight loss Eyes: negative diplopia or visual changes, negative eye pain ENT: negative coryza, negative sore throat Resp: negative cough, hemoptysis, dyspnea Cards: negative for chest pain, palpitations, lower extremity edema GU: negative for frequency, dysuria and hematuria GI: as above Skin: negative for rash and pruritus Heme: negative for easy bruising and gum/nose bleeding MS: ambulates with a walker Neurolo:negative for headaches, dizziness, vertigo, memory problems  Psych: negative for feelings of anxiety, depression  Endocrine: negative for thyroid, diabetes Allergy/Immunology- as  above Objective:  VITALS:  BP (!) 174/85   Pulse 63   Temp 98.2 F (36.8 C) (Temporal)   Ht 5\' 4"  (1.626 m)   Wt 135 lb (61.2 kg)   LMP  (LMP Unknown)   SpO2 98%   BMI 23.17 kg/m   PHYSICAL EXAM:  General: Alert, cooperative, no distress, appears stated age.  Head: Normocephalic, without obvious abnormality, atraumatic. Eyes: Conjunctivae clear, anicteric  sclerae. Pupils are equal ENT Nares normal. No drainage or sinus tenderness. Lips, mucosa, and tongue normal. No Thrush Neck: Supple, symmetrical, no adenopathy, thyroid: non tender no carotid bruit and no JVD. Back: No CVA tenderness. Lungs: Clear to auscultation bilaterally. No Wheezing or Rhonchi. No rales. Heart: Regular rate and rhythm, no murmur, rub or gallop. Abdomen: Soft, non-tender,not distended. Bowel sounds normal. No masses Extremities: atraumatic, no cyanosis. No edema. No clubbing Skin: No rashes or lesions. Or bruising Lymph: Cervical, supraclavicular normal. Neurologic: Grossly non-focal Pertinent Labs Lab Results CBC    Component Value Date/Time   WBC 21.0 (H) 07/12/2023 1525   WBC 21.5 (H) 07/12/2023 1525   RBC 2.69 (L) 07/12/2023 1525   RBC 2.75 (L) 07/12/2023 1525   HGB 10.0 (L) 07/12/2023 1525   HGB 10.0 (L) 07/12/2023 1525   HGB 11.6 (L) 08/21/2014 1141   HCT 29.5 (L) 07/12/2023 1525   HCT 30.1 (L) 07/12/2023 1525   HCT 34.1 (L) 08/21/2014 1141   PLT 145 (L) 07/12/2023 1525   PLT 145 (L) 07/12/2023 1525   PLT 183 08/21/2014 1141   MCV 109.7 (H) 07/12/2023 1525   MCV 109.5 (H) 07/12/2023 1525   MCV 107 (H) 08/21/2014 1141   MCH 37.2 (H) 07/12/2023 1525   MCH 36.4 (H) 07/12/2023 1525   MCHC 33.9 07/12/2023 1525   MCHC 33.2 07/12/2023 1525   RDW 14.8 07/12/2023 1525   RDW 14.8 07/12/2023 1525   RDW 15.3 (H) 08/21/2014 1141   LYMPHSABS 14.1 (H) 07/12/2023 1525   LYMPHSABS 10.4 (H) 06/20/2012 1403   MONOABS 0.3 07/12/2023 1525   MONOABS 0.5 06/20/2012 1403   EOSABS 0.0 07/12/2023 1525    EOSABS 0.2 06/20/2012 1403   BASOSABS 0.1 07/12/2023 1525   BASOSABS 0.1 06/20/2012 1403       Latest Ref Rng & Units 07/12/2023    3:25 PM 07/11/2023    8:02 AM 06/26/2023    4:52 AM  CMP  Glucose 70 - 99 mg/dL 956  95  87   BUN 8 - 23 mg/dL 32  22  17   Creatinine 0.44 - 1.00 mg/dL 2.13  0.86  5.78   Sodium 135 - 145 mmol/L 137  139  138   Potassium 3.5 - 5.1 mmol/L 4.0  3.8  3.6   Chloride 98 - 111 mmol/L 106  108  109   CO2 22 - 32 mmol/L 21  27  21    Calcium 8.9 - 10.3 mg/dL 9.9  9.8  9.3   Total Protein 6.5 - 8.1 g/dL 6.4  5.6    Total Bilirubin 0.0 - 1.2 mg/dL 0.6  0.5    Alkaline Phos 38 - 126 U/L 64     AST 15 - 41 U/L 22  18    ALT 0 - 44 U/L 15  11      ? Impression/Recommendation ?Clostridium ramosum bacteremia   Clostridium ramosum bacteremia was confirmed by two separate blood cultures on consecutive days. Extensive investigations, including CT scans, PET scan, colonoscopy, and TEE, ruled out potential sources such as colon cancer or aortic graft infection. She completed a course of Augmentin, and subsequent blood cultures were negative, indicating successful treatment. No further blood cultures are needed unless symptoms recur.  Mesenteric ischemia   She experiences sharp, intermittent abdominal pain, nausea, and vomiting, with pain starting in the epigastric area and moving downward, suggesting involvement of the small intestine between the jejunum and colon. A history of mesenteric ischemia  is suspected to be the cause. Video capsule endoscopy was considered but not performed . Pain may be related to dietary habits and constipation. Follow up with gastroenterologist Dr. Timothy Lasso in five weeks. Perform a bowel cleanse as recommended by Dr. Timothy Lasso. Consider video capsule endoscopy if symptoms persist.  Constipation   Constipation may be related to dietary habits and mesenteric ischemia. She follows a light diet, which may contribute to constipation. Dr. Timothy Lasso recommended  a bowel cleanse and Miralax. She has infrequent bowel movements, which she does not find concerning. Perform a bowel cleanse as recommended by Dr. Timothy Lasso. Use Miralax as directed by Dr. Timothy Lasso.  Hypertension   She reports episodes of hypertension, feeling breathless, dizzy, and experiencing tinnitus. She is on multiple antihypertensive medications, including amlodipine, clonidine patch, hydralazine, and losartan. Symptoms suggest poorly controlled hypertension, possibly contributing to her weakness and fatigue. Monitor blood pressure regularly. Discuss blood pressure management with primary care physician.  Chronic lymphocytic leukemia   Chronic lymphocytic leukemia was identified through hematology consultation due to leukocytosis. The condition requires observation only, with no active treatment needed at this time. Continue observation without active treatment.  Adrenal insufficiency (rule out)   Adrenal insufficiency is considered as a potential cause of her abdominal pain, although hypertension and lack of symptoms such as hyponatremia make this less likely. Serum cortisol and ACTH level tests are suggested to rule out this condition. Order serum cortisol and ACTH level tests to rule out adrenal insufficiency.  Goals of Care   She desires a pain-free life and is tired of numerous tests and treatments. She values clear communication and understanding of her medical condition and treatment plan. At 89 years old, she feels weak and tired, wishing to limit further invasive testing unless necessary. Ensure clear communication regarding treatment plans and test results. Respect her wishes to limit further invasive testing unless necessary. ? ? ________________________________________________ Discussed with patient in detail Follow PRN Note:  This document was prepared using Dragon voice recognition software and may include unintentional dictation errors.

## 2023-08-04 NOTE — Patient Instructions (Signed)
 Today, we reviewed your recent hospitalization for Clostridium ramosum bacteremia and discussed your ongoing health concerns, including abdominal pain, constipation, hypertension, and chronic lymphocytic leukemia. We also addressed your goals of care and desire to limit further invasive testing unless absolutely necessary.  YOUR PLAN:  -CLOSTRIDIUM RAMOSUM BACTEREMIA: Clostridium ramosum bacteremia is a bacterial infection in the blood. You completed a course of Augmentin, and your blood cultures are now negative, indicating successful treatment. No further blood cultures are needed unless symptoms return.  -MESENTERIC ISCHEMIA: Mesenteric ischemia is a condition where there is reduced blood flow to the intestines, causing pain. Your abdominal pain may be related to this condition. Follow up with Dr. Timothy Lasso in five weeks and perform a bowel cleanse as recommended. If symptoms persist, consider a video capsule endoscopy.  -CONSTIPATION: Constipation is infrequent bowel movements. It may be related to your diet and mesenteric ischemia. Follow Dr. Ferd Hibbs recommendation for a bowel cleanse and use Miralax as directed.  -HYPERTENSION: Hypertension is high blood pressure. Your symptoms suggest it is not well controlled. Monitor your blood pressure regularly and discuss management with your primary care physician.  -CHRONIC LYMPHOCYTIC LEUKEMIA: Chronic lymphocytic leukemia is a type of cancer that affects the blood and bone marrow. It is currently stable and does not require treatment. Continue observation without active treatment.  -GOALS OF CARE: You wish to have a pain-free life and limit further invasive testing unless necessary. We will ensure clear communication regarding your treatment plans and respect your wishes.  INSTRUCTIONS:  Follow up with Dr. Timothy Lasso in five weeks for your abdominal pain. Perform a bowel cleanse as recommended by Dr. Timothy Lasso. Use Miralax as directed. Monitor your blood  pressure regularly and discuss management with your primary care physician. your PCP may consider serum cortisol and ACTH level tests to rule out adrenal insufficiency although les slikely. I will send a message to her

## 2023-08-05 ENCOUNTER — Ambulatory Visit: Payer: Medicare Other | Admitting: Student

## 2023-08-07 NOTE — Progress Notes (Signed)
 Patient's bone density consistent with osteoporosis. Will discuss treatment options at upcoming appointment.

## 2023-08-10 ENCOUNTER — Encounter: Payer: Self-pay | Admitting: Student

## 2023-08-10 ENCOUNTER — Ambulatory Visit (INDEPENDENT_AMBULATORY_CARE_PROVIDER_SITE_OTHER): Payer: Medicare Other | Admitting: Student

## 2023-08-10 VITALS — BP 178/68 | HR 76 | Temp 98.1°F | Ht 64.0 in | Wt 133.0 lb

## 2023-08-10 DIAGNOSIS — R0609 Other forms of dyspnea: Secondary | ICD-10-CM

## 2023-08-10 DIAGNOSIS — K5900 Constipation, unspecified: Secondary | ICD-10-CM

## 2023-08-10 DIAGNOSIS — J449 Chronic obstructive pulmonary disease, unspecified: Secondary | ICD-10-CM | POA: Diagnosis not present

## 2023-08-10 DIAGNOSIS — I10 Essential (primary) hypertension: Secondary | ICD-10-CM | POA: Diagnosis not present

## 2023-08-10 MED ORDER — CLONIDINE HCL 0.2 MG/24HR TD PTWK: 0.2000 mg | MEDICATED_PATCH | TRANSDERMAL | 11 refills | Status: DC

## 2023-08-10 MED ORDER — SPIRIVA RESPIMAT 2.5 MCG/ACT IN AERS: 2.0000 | INHALATION_SPRAY | Freq: Every day | RESPIRATORY_TRACT | 3 refills | Status: DC

## 2023-08-10 MED ORDER — BREZTRI AEROSPHERE 160-9-4.8 MCG/ACT IN AERO: 2.0000 | INHALATION_SPRAY | Freq: Two times a day (BID) | RESPIRATORY_TRACT | 11 refills | Status: DC

## 2023-08-10 MED ORDER — AMLODIPINE BESYLATE 5 MG PO TABS: 10.0000 mg | ORAL_TABLET | Freq: Every day | ORAL | 3 refills | Status: DC

## 2023-08-10 NOTE — Patient Instructions (Addendum)
 VISIT SUMMARY:  Today, we discussed your recent bowel movement irregularities, fatigue, and other health concerns. We reviewed your current medications and made some adjustments to better manage your symptoms and overall health.  YOUR PLAN:  -HYPERTENSION: Hypertension, or high blood pressure, can increase the risk of stroke and other health issues. We have increased your amlodipine to 10 mg daily and recommend taking all your blood pressure medications in the morning to help manage your blood pressure more effectively.  -HEART FAILURE: Heart failure occurs when the heart cannot pump blood as well as it should. Your heart function is currently normal, and you are not experiencing any symptoms of heart failure at this time.  -CHRONIC OBSTRUCTIVE PULMONARY DISEASE (COPD): COPD is a chronic lung disease that causes breathing difficulties and increased mucus production. We have started you on an inhaler to use two puffs daily, and you should swish and spit with water after using the inhaler to prevent any side effects.  -CONSTIPATION: Constipation is when you have infrequent or difficult bowel movements. To maintain regular bowel movements, continue using Miralax, but skip a day if you experience diarrhea.  INSTRUCTIONS:  We will inform the pharmacy of your medication changes for delivery. Please ensure you take all your medications as prescribed and follow up with [place] if you have any concerns or if your symptoms persist.

## 2023-08-10 NOTE — Progress Notes (Signed)
 [facility]: [facility] Provider: Sydnee Cabal  Code Status: DNR Goals of Care:     07/12/2023    3:24 PM  Advanced Directives  Does Patient Have a Medical Advance Directive? No  Does patient want to make changes to medical advance directive? No - Patient declined  Would patient like information on creating a medical advance directive? No - Patient declined     Chief Complaint  Patient presents with   Medical Management of Chronic Issues    Medical Management of Chronic Issues. 1 Month Follow up Complains of Fatigue and Cold, stated that it started a couple of hours ago.    HPI: Patient is a 89 y.o. female seen today for medical management of chronic diseases.   Discussed the use of AI scribe software for clinical note transcription with the patient, who gave verbal consent to proceed.  History of Present Illness   Tiffany Martinez is an 89 year old female with a history of mesenteric ischemia who presents with bowel movement irregularities and fatigue.  She has been experiencing bowel movement irregularities, having undergone a bowel clean-out procedure last Friday, which she found distressing. She was instructed to have two bowel movements daily for a week, but her low food intake has made this challenging. She had her first satisfactory bowel movement since the procedure this morning. She used a bottle of Miralax mixed with ginger ale for the clean-out and has another bottle but is not currently using it as she had a good bowel movement today. No pain or blockage during bowel movements.  She feels very tired and cold, even with the thermostat set at 73 degrees. She uses a blood pressure patch and takes clonidine, losartan, and metoprolol, but is unsure if she took all her medications last night. She is concerned about her high blood pressure contributing to her fatigue.  She experiences shortness of breath and wheezing, with significant phlegm production every  morning since a transesophageal echocardiogram at the end of January. She attributes the phlegm to this procedure. She does not currently use inhalers. She has a history of smoking, which she started at age 69 or 66, and acknowledges that smoking may contribute to her mucus production.  She recalls being diagnosed with acute heart failure in February, although the echocardiogram showed normal heart function. She was previously hospitalized for acute heart failure but is currently compensated and not experiencing symptoms related to heart failure.  She mentions a history of infectious disease in her blood, which required follow-ups with multiple doctors.         Past Medical History:  Diagnosis Date   AAA (abdominal aortic aneurysm) (HCC) 2012   3.9cm 2012; 2.8cm 12/2021   Anemia    Anxiety    CHF (congestive heart failure) (HCC)    LVEF 60-65% in 2023   Chronic venous insufficiency    CLL (chronic lymphocytic leukemia) (HCC) 2014   Stage 0--by flow cytometry   Hx of colonoscopy    Hyperlipidemia    Hypertension    Melanoma (HCC)    Right Arm   Migraine headache    3-4X/yr   PAD (peripheral artery disease) (HCC) 2012   severe R common iliac stenosis, ABI 0.73, mid L common iliac stenosis ABI 1.1   PAD (peripheral artery disease) (HCC)    Paroxysmal atrial fibrillation (HCC) 12/2021   Wears dentures    full upper and lower    Past Surgical History:  Procedure  Laterality Date   ABDOMINAL AORTIC ANEURYSM REPAIR  02/16/12   [facility]--Dr Pattricia Boss   BIOPSY  06/26/2023   Procedure: BIOPSY;  Surgeon: Jaynie Collins, DO;  Location: [facility] ENDOSCOPY;  Service: Gastroenterology;;   CARDIOVASCULAR STRESS TEST  2008   negative   CATARACT EXTRACTION W/PHACO Left 07/16/2019   Procedure: CATARACT EXTRACTION PHACO AND INTRAOCULAR LENS PLACEMENT (IOC) LEFT 8.26  00:46.6;  Surgeon: Nevada Crane, MD;  Location: [facility]GERY [facility];  Service: Ophthalmology;  Laterality: Left;   COLONOSCOPY WITH  PROPOFOL N/A 06/26/2023   Procedure: COLONOSCOPY WITH PROPOFOL;  Surgeon: Jaynie Collins, DO;  Location: [facility] ENDOSCOPY;  Service: Gastroenterology;  Laterality: N/A;   DOBUTAMINE STRESS ECHO  11/11   negative   ESOPHAGOGASTRODUODENOSCOPY (EGD) WITH PROPOFOL N/A 06/26/2023   Procedure: ESOPHAGOGASTRODUODENOSCOPY (EGD) WITH PROPOFOL;  Surgeon: Jaynie Collins, DO;  Location: [facility] ENDOSCOPY;  Service: Gastroenterology;  Laterality: N/A;   HIP ARTHROPLASTY Right 10/10/2019   Procedure: ARTHROPLASTY BIPOLAR HIP (HEMIARTHROPLASTY);  Surgeon: Christena Flake, MD;  Location: [facility] ORS;  Service: Orthopedics;  Laterality: Right;   HOT HEMOSTASIS  06/26/2023   Procedure: HOT HEMOSTASIS (ARGON PLASMA COAGULATION/BICAP);  Surgeon: Jaynie Collins, DO;  Location: [facility] ENDOSCOPY;  Service: Gastroenterology;;   KNEE ARTHROSCOPY  1998   right   MELANOMA EXCISION  11/09/10   wide excision right upper arm   SQUAMOUS CELL CARCINOMA EXCISION  02/2016   TEE WITHOUT CARDIOVERSION N/A 06/15/2023   Procedure: TRANSESOPHAGEAL ECHOCARDIOGRAM (TEE);  Surgeon: Antonieta Iba, MD;  Location: [facility] ORS;  Service: Cardiovascular;  Laterality: N/A;   TONSILLECTOMY AND ADENOIDECTOMY  1960    Allergies  Allergen Reactions   Triamterene Hives    Outpatient Encounter Medications as of 08/10/2023  Medication Sig   apixaban (ELIQUIS) 2.5 MG TABS tablet Take 1 tablet (2.5 mg total) by mouth 2 (two) times daily.   budeson-glycopyrrolate-formoterol (BREZTRI AEROSPHERE) 160-9-4.8 MCG/ACT AERO Inhale 2 puffs into the lungs 2 (two) times daily.   loratadine (CLARITIN) 10 MG tablet Take 10 mg by mouth daily.   losartan (COZAAR) 100 MG tablet Take 1 tablet (100 mg total) by mouth daily.   metoprolol succinate (TOPROL XL) 25 MG 24 hr tablet Take 0.5 tablets (12.5 mg total) by mouth daily.   Multiple Vitamin (MULTIVITAMIN ADULT PO) Take 1 tablet by mouth daily.   ondansetron (ZOFRAN-ODT) 8 MG disintegrating tablet Take 1  tablet (8 mg total) by mouth every 8 (eight) hours as needed for nausea or vomiting.   pantoprazole (PROTONIX) 40 MG tablet Take 1 tablet (40 mg total) by mouth 2 (two) times daily.   [DISCONTINUED] amLODipine (NORVASC) 5 MG tablet Take 1 tablet (5 mg total) by mouth daily.   [DISCONTINUED] cloNIDine (CATAPRES - DOSED IN MG/24 HR) 0.1 mg/24hr patch Place 1 patch (0.1 mg total) onto the skin every 7 (seven) days.   [DISCONTINUED] cloNIDine (CATAPRES - DOSED IN MG/24 HR) 0.1 mg/24hr patch Place 1 patch (0.1 mg total) onto the skin once a week.   [DISCONTINUED] Tiotropium Bromide Monohydrate (SPIRIVA RESPIMAT) 2.5 MCG/ACT AERS Inhale 2 puffs into the lungs daily.   amLODipine (NORVASC) 5 MG tablet Take 2 tablets (10 mg total) by mouth daily.   cloNIDine (CATAPRES - DOSED IN MG/24 HR) 0.2 mg/24hr patch Place 1 patch (0.2 mg total) onto the skin every 7 (seven) days.   No facility-administered encounter medications on file as of 08/10/2023.    Review of Systems:  Review of Systems  Health Maintenance  Topic  Date Due   COVID-19 Vaccine (5 - 2024-25 season) 07/20/2024 (Originally 01/16/2023)   Zoster Vaccines- Shingrix (1 of 2) 09/28/2024 (Originally 03/04/1953)   Medicare Annual Wellness (AWV)  01/13/2024   DTaP/Tdap/Td (3 - Tdap) 01/16/2024   Pneumonia Vaccine 53+ Years old  Completed   INFLUENZA VACCINE  Completed   DEXA SCAN  Completed   HPV VACCINES  Aged Out    Physical Exam: Vitals:   08/10/23 1338 08/10/23 1340  BP: (!) 178/68 (!) 178/68  Pulse: 76   Temp: 98.1 F (36.7 C)   SpO2: 96%   Weight: 133 lb (60.3 kg)   Height: 5\' 4"  (1.626 m)    Body mass index is 22.83 kg/m. Physical Exam Cardiovascular:     Rate and Rhythm: Normal rate.     Pulses: Normal pulses.  Pulmonary:     Breath sounds: Wheezing present.  Abdominal:     General: Abdomen is flat. Bowel sounds are normal.     Palpations: Abdomen is soft.  Skin:    General: Skin is warm and dry.  Neurological:      Mental Status: She is alert and oriented to person, place, and time.    Labs reviewed: Basic Metabolic Panel: Recent Labs    06/13/23 0500 06/14/23 0351 06/15/23 0332 06/16/23 0427 06/22/23 1906 06/26/23 0452 07/11/23 0802 07/12/23 1525  NA 140 138 139 140   < > 138 139 137  K 3.4* 3.5 3.6 3.4*   < > 3.6 3.8 4.0  CL 112* 108 106 111   < > 109 108 106  CO2 19* 23 22 22    < > 21* 27 21*  GLUCOSE 99 104* 94 92   < > 87 95 130*  BUN 24* 20 28* 27*   < > 17 22 32*  CREATININE 1.39* 1.34* 1.65* 1.42*   < > 1.39* 1.60* 1.58*  CALCIUM 9.5 9.3 9.3 9.2   < > 9.3 9.8 9.9  MG 1.9 2.1 2.1 2.1  --   --   --   --   PHOS 2.7  --   --   --   --   --   --   --    < > = values in this interval not displayed.   Liver Function Tests: Recent Labs    06/11/23 1500 06/22/23 1906 07/11/23 0802 07/12/23 1525  AST 27 24 18 22   ALT 15 17 11 15   ALKPHOS 68 56  --  64  BILITOT 1.0 0.6 0.5 0.6  PROT 6.7 6.0* 5.6* 6.4*  ALBUMIN 4.0 3.6  --  3.7   Recent Labs    06/22/23 1906 07/12/23 1525  LIPASE 43 42   No results for input(s): "AMMONIA" in the last 8760 hours. CBC: Recent Labs    06/22/23 1906 06/23/23 0554 06/26/23 0452 07/11/23 0802 07/12/23 1525  WBC 29.3*   < > 14.2* 17.5* 21.5*  21.0*  NEUTROABS 6.0  --   --  3,203 7.0  HGB 10.3*   < > 9.7* 10.3* 10.0*  10.0*  HCT 28.8*   < > 28.3* 31.2* 30.1*  29.5*  MCV 112.1*   < > 104.0* 108.0* 109.5*  109.7*  PLT 146*   < > 105* 119* 145*  145*   < > = values in this interval not displayed.   Lipid Panel: Recent Labs    07/11/23 0802  CHOL 172  HDL 43*  LDLCALC 107*  TRIG 120  CHOLHDL 4.0  Lab Results  Component Value Date   HGBA1C 5.4 08/02/2022    Procedures since last visit: DG Bone Density Result Date: 07/22/2023 EXAM: DUAL X-RAY ABSORPTIOMETRY (DXA) FOR BONE MINERAL DENSITY IMPRESSION: Your patient Tiffany Martinez completed a BMD test on 07/22/2023 using the [employer] iDXA DXA System (software version: 14.10)  manufactured by [employer]. The following summarizes the results of our evaluation. Technologist: [facility] PATIENT BIOGRAPHICAL: Name: Tiffany Martinez, Tiffany Martinez Patient ID: [id number] Birth Date: 1934-01-22 Height: 64.0 in. Gender: Female Exam Date: 07/22/2023 Weight: 135.4 lbs. Indications: Advanced Age, Caucasian, Postmenopausal, Tobacco User (Current Smoker) Fractures: Ankle Left, hip right, Pelvis, Ribs, Wrist Left Treatments: Mult. Vit with Vit D DENSITOMETRY RESULTS: Site          Region     Measured Date Measured Age WHO Classification Young Adult T-score BMD         %Change vs. Previous Significant Change (*) Left Femur Neck 9/22/[age] Osteoporosis -3.0 0.616 g/cm2 -18.0% Yes Left Femur Neck 09/08/2015 81.5 Osteopenia -2.1 0.751 g/cm2 - - Right Forearm Radius 33% 07[age]22/2023 89.3 Osteopenia -2.2 0.680 g/cm2 - - ASSESSMENT: The BMD measured at Femur Neck is 0.616 g/cm2 with a T-score of -3.0. This patient is considered osteoporotic according to World Health Organization [facility]) criteria. The scan quality is good. Right femur was excluded due to surgical hardware. Lumbar spine was not utilized due to artifact from overlying abdominal stent. World Science writer [facility]) criteria for post-menopausal, Caucasian Women: Normal:                   T-score at or above -1 SD Osteopenia/low bone mass: T-score between -1 and -2.5 SD Osteoporosis:             T-score at or below -2.5 SD RECOMMENDATIONS: 1. All patients should optimize calcium and vitamin D intake. 2. Consider FDA-approved medical therapies in postmenopausal women and men aged 44 years and older, based on the following: a. A hip or vertebral(clinical or morphometric) fracture b. T-score < -2.5 at the femoral neck or spine after appropriate evaluation to exclude secondary causes c. Low bone mass (T-score between -1.0 and -2.5 at the femoral neck or spine) and a 10-year probability of a hip fracture > 3% or a 10-year probability of a major  osteoporosis-related fracture > 20% based on the [place]-adapted WHO algorithm 3. Clinician judgment and/or patient preferences may indicate treatment for people with 10-year fracture probabilities above or below these levels FOLLOW-UP: People with diagnosed cases of osteoporosis or at high risk for fracture should have regular bone mineral density tests. For patients eligible for Medicare, routine testing is allowed once every 2 years. The testing frequency can be increased to one year for patients who have rapidly progressing disease, those who are receiving or discontinuing medical therapy to restore bone mass, or have additional risk factors. I have reviewed this report, and agree with the above findings. [facility] Electronically Signed   By: Baird Lyons M.D.   On: 07/22/2023 11:53   CT ABDOMEN PELVIS WO CONTRAST Result Date: 07/12/2023 CLINICAL DATA:  Left lower quadrant abdominal pain, nausea, vomiting and diarrhea. EXAM: CT ABDOMEN AND PELVIS WITHOUT CONTRAST TECHNIQUE: Multidetector CT imaging of the abdomen and pelvis was performed following the standard protocol without IV contrast. RADIATION DOSE REDUCTION: This exam was performed according to the departmental dose-optimization program which includes automated exposure control, adjustment of the mA and/or kV according to patient size and/or use of iterative reconstruction technique. COMPARISON:  06/22/2023 FINDINGS: Lower chest: Borderline enlarged heart. Mild right basilar and minimal left basilar linear atelectasis or scarring. Hepatobiliary: Multiple small gallstones in the dependent portion of the gallbladder, measuring up to 3 mm in maximum diameter each. There is also sludge in the gallbladder. No gallbladder wall thickening or pericholecystic fluid. Pancreas: Unremarkable. No pancreatic ductal dilatation or surrounding inflammatory changes. Spleen: Normal in size without focal abnormality. Adrenals/Urinary Tract: Unremarkable adrenal  glands. Stable moderate to marked right renal atrophy. Previously demonstrated small bilateral simple appearing renal cysts not needing imaging follow-up. Unremarkable ureters and urinary bladder. Stomach/Bowel: Mild colonic diverticulosis without evidence of diverticulitis. Stable prominent stool in the sigmoid colon. Interval small appendicoliths without evidence of appendicitis. Unremarkable stomach and small bowel. Vascular/Lymphatic: Atheromatous arterial calcifications without aneurysm. No enlarged lymph nodes. Stable aorto bi-iliac stent graft. Reproductive: No significant change in partially calcified uterine fibroids. No adnexal mass Other: No abdominal wall hernia or abnormality. No abdominopelvic ascites. Musculoskeletal: Stable right hip prosthesis. Lumbar spine degenerative changes. IMPRESSION: 1. No acute abnormality. 2. Mild colonic diverticulosis without evidence of diverticulitis. 3. Stable prominent stool in the sigmoid colon. 4. Cholelithiasis and gallbladder sludge without evidence of cholecystitis. 5. Stable moderate to marked right renal atrophy. 6. Stable uterine fibroids. Electronically Signed   By: Beckie Salts M.D.   On: 07/12/2023 18:55   DG Chest Port 1 View Result Date: 07/12/2023 CLINICAL DATA:  Possible sepsis. EXAM: PORTABLE CHEST 1 VIEW COMPARISON:  06/11/2023 FINDINGS: Stable normal sized heart and tortuous and calcified thoracic aorta. The lungs remain mildly hyperexpanded and clear with normal vascularity. Diffuse osteopenia. IMPRESSION: 1. No acute abnormality. 2. COPD. 3. Diffuse osteopenia. Electronically Signed   By: Beckie Salts M.D.   On: 07/12/2023 17:48   Results   Transesophageal echocardiogram: Left ventricular ejection fraction 55-60%, normal left and right ventricular function, no wall abnormalities, no thrombus in left atrium and appendage, mild mitral valve regurgitation, no stenosis, normal aortic valve with mild regurgitation, normal inferior vena cava size  with >50% respiratory variability, right atrial pressure 3 mmHg, negative bubble study, no valve vegetation (06/15/2023)      Assessment/Plan    Hypertension Blood pressure is elevated with associated fatigue and cold sensation. Current regimen includes a blood pressure patch, clonidine, losartan, and metoprolol. There is a risk of stroke due to hypertension. - Increase amlodipine to 10 mg daily - Administer all antihypertensive medications in the morning  Heart Failure Compensated heart failure with normal heart function and mild regurgitation on recent transesophageal echocardiogram. No symptoms of heart failure currently.  Chronic Obstructive Pulmonary Disease (COPD) Increased mucus production and wheezing suggestive of COPD, likely exacerbated by smoking history. Significant phlegm production attributed to COPD rather than prior transesophageal echocardiogram. - Initiate inhaler with two puffs daily - Advise to swish and spit with water post-inhaler use  Constipation Post-bowel clean-out, regular bowel movements are necessary despite low food intake. Not currently using Miralax after a successful bowel movement. - Continue Miralax to maintain regular bowel movements - Omit Miralax for a day if diarrhea occurs  Follow-up Medications are supplied by [facility] with Wednesday night deliveries. Medication changes will be communicated to the pharmacy to ensure updated prescriptions. - Inform pharmacy of medication changes for delivery      Collect labs CBC, CMP, BNP  Labs/tests ordered:  * No order type specified * Next appt:  09/14/2023

## 2023-08-11 DIAGNOSIS — I10 Essential (primary) hypertension: Secondary | ICD-10-CM | POA: Diagnosis not present

## 2023-08-11 DIAGNOSIS — R0609 Other forms of dyspnea: Secondary | ICD-10-CM | POA: Diagnosis not present

## 2023-08-11 DIAGNOSIS — J449 Chronic obstructive pulmonary disease, unspecified: Secondary | ICD-10-CM | POA: Diagnosis not present

## 2023-08-11 LAB — BRAIN NATRIURETIC PEPTIDE: Brain Natriuretic Peptide: 177 pg/mL — ABNORMAL HIGH

## 2023-08-12 LAB — CBC WITH DIFFERENTIAL/PLATELET
Absolute Lymphocytes: 15040 {cells}/uL — ABNORMAL HIGH (ref 850–3900)
Absolute Monocytes: 420 {cells}/uL (ref 200–950)
Basophils Absolute: 80 {cells}/uL (ref 0–200)
Basophils Relative: 0.4 %
Eosinophils Absolute: 160 {cells}/uL (ref 15–500)
Eosinophils Relative: 0.8 %
HCT: 32.8 % — ABNORMAL LOW (ref 35.0–45.0)
Hemoglobin: 10.8 g/dL — ABNORMAL LOW (ref 11.7–15.5)
MCH: 35.3 pg — ABNORMAL HIGH (ref 27.0–33.0)
MCHC: 32.9 g/dL (ref 32.0–36.0)
MCV: 107.2 fL — ABNORMAL HIGH (ref 80.0–100.0)
MPV: 12 fL (ref 7.5–12.5)
Monocytes Relative: 2.1 %
Neutro Abs: 4300 {cells}/uL (ref 1500–7800)
Neutrophils Relative %: 21.5 %
Platelets: 125 10*3/uL — ABNORMAL LOW (ref 140–400)
RBC: 3.06 10*6/uL — ABNORMAL LOW (ref 3.80–5.10)
RDW: 13.4 % (ref 11.0–15.0)
Total Lymphocyte: 75.2 %
WBC: 20 10*3/uL — ABNORMAL HIGH (ref 3.8–10.8)

## 2023-08-12 LAB — COMPLETE METABOLIC PANEL WITHOUT GFR
AG Ratio: 1.8 (calc) (ref 1.0–2.5)
ALT: 10 U/L (ref 6–29)
AST: 17 U/L (ref 10–35)
Albumin: 3.8 g/dL (ref 3.6–5.1)
Alkaline phosphatase (APISO): 77 U/L (ref 37–153)
BUN/Creatinine Ratio: 17 (calc) (ref 6–22)
BUN: 29 mg/dL — ABNORMAL HIGH (ref 7–25)
CO2: 26 mmol/L (ref 20–32)
Calcium: 9.5 mg/dL (ref 8.6–10.4)
Chloride: 107 mmol/L (ref 98–110)
Creat: 1.73 mg/dL — ABNORMAL HIGH (ref 0.60–0.95)
Globulin: 2.1 g/dL (ref 1.9–3.7)
Glucose, Bld: 101 mg/dL (ref 65–139)
Potassium: 4.2 mmol/L (ref 3.5–5.3)
Sodium: 140 mmol/L (ref 135–146)
Total Bilirubin: 0.5 mg/dL (ref 0.2–1.2)
Total Protein: 5.9 g/dL — ABNORMAL LOW (ref 6.1–8.1)

## 2023-09-14 ENCOUNTER — Ambulatory Visit: Admitting: Student

## 2023-09-14 ENCOUNTER — Encounter: Payer: Self-pay | Admitting: Student

## 2023-09-14 VITALS — BP 132/68 | HR 74 | Temp 98.0°F | Ht 64.0 in | Wt 131.0 lb

## 2023-09-14 DIAGNOSIS — R197 Diarrhea, unspecified: Secondary | ICD-10-CM

## 2023-09-14 DIAGNOSIS — K551 Chronic vascular disorders of intestine: Secondary | ICD-10-CM

## 2023-09-14 DIAGNOSIS — I10 Essential (primary) hypertension: Secondary | ICD-10-CM | POA: Diagnosis not present

## 2023-09-14 MED ORDER — CLONIDINE HCL 0.2 MG/24HR TD PTWK: 0.2000 mg | MEDICATED_PATCH | TRANSDERMAL | 11 refills | Status: DC

## 2023-09-14 NOTE — Progress Notes (Signed)
 [facility] Provider: Jann Melody  Code Status: DNR Goals of Care:     09/14/2023    3:13 PM  Advanced Directives  Does Patient Have a Medical Advance Directive? No;Yes  Type of Advance Directive Out of facility DNR (pink MOST or yellow form)  Does patient want to make changes to medical advance directive? No - Patient declined     Chief Complaint  Patient presents with   Medical Management of Chronic Issues    Medical Management of Chronic Issues. 1 Month follow up. Having Diarrhea 3-4 days. Thinks she took too much Miralax .     HPI: Patient is a 89 y.o. female seen today for medical management of chronic diseases.   Discussed the use of AI scribe software for clinical note transcription with the patient, who gave verbal consent to proceed.  History of Present Illness   Tiffany Martinez is an 89 year old female with mesenteric ischemia who presents with diarrhea.  She has been experiencing diarrhea, which she attributes to excessive use of Miralax  for constipation. There have been three to four episodes this week, with the stools being very loose and pale brown. She has stopped taking Miralax  to see if the diarrhea resolves.  She feels weak and fatigued, requiring frequent rest. Despite sleeping from 10 PM to 8:30 AM, she naps from 11 AM to 2 PM. She is not engaging in much activity.  She has a history of mesenteric ischemia and is cautious about her diet. She is unsure if her symptoms are related to her diet or Miralax . She eats meals from [employer], consumes a lot of salmon, and drinks ginger ale and coffee regularly.  She experiences a tingling sensation in her tongue when consuming Boost nutritional drinks, which she has stopped due to this reaction. She resumed it once due to not having breakfast, resulting in more pronounced tingling.  She is on multiple medications, including clonidine , eliquis , and pantoprazole  for acid reflux. She has  concerns about the cost of her medications and mentions having a trust that manages her bills, but there is confusion about payments.  No leg swelling is noted, and she reports that her legs look excellent. She uses Vaseline to manage dry skin on her ankles. She smokes but does not feel it affects her breathing significantly.         Past Medical History:  Diagnosis Date   AAA (abdominal aortic aneurysm) (HCC) 2012   3.9cm 2012; 2.8cm 12/2021   Anemia    Anxiety    CHF (congestive heart failure) (HCC)    LVEF 60-65% in 2023   Chronic venous insufficiency    CLL (chronic lymphocytic leukemia) (HCC) 2014   Stage 0--by flow cytometry   Hx of colonoscopy    Hyperlipidemia    Hypertension    Melanoma (HCC)    Right Arm   Migraine headache    3-4X/yr   PAD (peripheral artery disease) (HCC) 2012   severe R common iliac stenosis, ABI 0.73, mid L common iliac stenosis ABI 1.1   PAD (peripheral artery disease) (HCC)    Paroxysmal atrial fibrillation (HCC) 12/2021   Wears dentures    full upper and lower    Past Surgical History:  Procedure Laterality Date   ABDOMINAL AORTIC ANEURYSM REPAIR  02/16/12   [facility]--Dr Myles Arvin   BIOPSY  06/26/2023   Procedure: BIOPSY;  Surgeon: Quintin Buckle, DO;  Location: [facility] ENDOSCOPY;  Service: Gastroenterology;;  CARDIOVASCULAR STRESS TEST  2008   negative   CATARACT EXTRACTION W/PHACO Left 07/16/2019   Procedure: CATARACT EXTRACTION PHACO AND INTRAOCULAR LENS PLACEMENT (IOC) LEFT 8.26  00:46.6;  Surgeon: Rosa College, MD;  Location: [facility];  Service: Ophthalmology;  Laterality: Left;   COLONOSCOPY WITH PROPOFOL  N/A 06/26/2023   Procedure: COLONOSCOPY WITH PROPOFOL ;  Surgeon: Quintin Buckle, DO;  Location: [facility] ENDOSCOPY;  Service: Gastroenterology;  Laterality: N/A;   DOBUTAMINE  STRESS ECHO  11/11   negative   ESOPHAGOGASTRODUODENOSCOPY (EGD) WITH PROPOFOL  N/A 06/26/2023   Procedure: ESOPHAGOGASTRODUODENOSCOPY (EGD) WITH  PROPOFOL ;  Surgeon: Quintin Buckle, DO;  Location: [facility] END[facility]COPY;  Service: Gastroenterology;  Laterality: N/A;   HIP ARTHROPLASTY Right 10/10/2019   Procedure: ARTHROPLASTY BIPOLAR HIP (HEMIARTHROPLASTY);  Surgeon: Elner Hahn, MD;  Location: [facility] ORS;  Service: Orthopedics;  Laterality: Right;   HOT HEMOSTASIS  06/26/2023   Procedure: HOT HEMOSTASIS (ARGON PLASMA COAGULATION/BICAP);  Surgeon: Quintin Buckle, DO;  Location: [facility] ENDOSCOPY;  Service: Gastroenterology;;   KNEE ARTHROSCOPY  1998   right   MELANOMA EXCISION  11/09/10   wide excision right upper arm   SQUAMOUS CELL CARCINOMA EXCISION  02/2016   TEE WITHOUT CARDIOVERSION N/A 06/15/2023   Procedure: TRANSESOPHAGEAL ECHOCARDIOGRAM (TEE);  Surgeon: Devorah Fonder, MD;  Location: [facility] ORS;  Service: Cardiovascular;  Laterality: N/A;   TONSILLECTOMY AND ADENOIDECTOMY  1960    Allergies  Allergen Reactions   Triamterene  Hives    Outpatient Encounter Medications as of 09/14/2023  Medication Sig   amLODipine  (NORVASC ) 5 MG tablet Take 2 tablets (10 mg total) by mouth daily.   apixaban  (ELIQUIS ) 2.5 MG TABS tablet Take 1 tablet (2.5 mg total) by mouth 2 (two) times daily.   budeson-glycopyrrolate -formoterol (BREZTRI  AEROSPHERE) 160-9-4.8 MCG/ACT AERO Inhale 2 puffs into the lungs 2 (two) times daily.   cloNIDine  (CATAPRES  - DOSED IN MG/24 HR) 0.2 mg/24hr patch Place 1 patch (0.2 mg total) onto the skin every 7 (seven) days.   loratadine  (CLARITIN ) 10 MG tablet Take 10 mg by mouth daily.   losartan  (COZAAR ) 100 MG tablet Take 1 tablet (100 mg total) by mouth daily.   metoprolol  succinate (TOPROL  XL) 25 MG 24 hr tablet Take 0.5 tablets (12.5 mg total) by mouth daily.   Multiple Vitamin (MULTIVITAMIN ADULT PO) Take 1 tablet by mouth daily.   ondansetron  (ZOFRAN -ODT) 8 MG disintegrating tablet Take 1 tablet (8 mg total) by mouth every 8 (eight) hours as needed for nausea or vomiting.   pantoprazole  (PROTONIX ) 40 MG tablet  Take 1 tablet (40 mg total) by mouth 2 (two) times daily.   [DISCONTINUED] cloNIDine  (CATAPRES  - DOSED IN MG/24 HR) 0.2 mg/24hr patch Place 1 patch (0.2 mg total) onto the skin every 7 (seven) days.   No facility-administered encounter medications on file as of 09/14/2023.    Review of Systems:  Review of Systems  Health Maintenance  Topic Date Due   COVID-19 Vaccine (5 - 2024-25 season) 07/20/2024 (Originally 01/16/2023)   Zoster Vaccines- Shingrix  (1 of 2) 09/28/2024 (Originally 03/04/1953)   INFLUENZA VACCINE  12/16/2023   Medicare Annual Wellness (AWV)  01/13/2024   DTaP/Tdap/Td (3 - Tdap) 01/16/2024   Pneumonia Vaccine 54+ Years old  Completed   DEXA SCAN  Completed   HPV VACCINES  Aged Out   Meningococcal B Vaccine  Aged Out    Physical Exam: Vitals:   09/14/23 1510  BP: 132/68  Pulse: 74  Temp: 98 F (36.7 C)  SpO2: 96%  Weight: 131 lb (59.4 kg)  Height: 5\' 4"  (1.626 m)   Body mass index is 22.49 kg/m. Physical Exam Physical Exam   VITALS: BP- 132/68 CHEST: Lungs clear to auscultation, no wheezing. EXTREMITIES: No leg swelling, legs appear normal.      Labs reviewed: Basic Metabolic Panel: Recent Labs    06/13/23 0500 06/14/23 0351 06/15/23 0332 06/16/23 0427 06/22/23 1906 07/11/23 0802 07/12/23 1525 08/11/23 0925  NA 140 138 139 140   < > 139 137 140  K 3.4* 3.5 3.6 3.4*   < > 3.8 4.0 4.2  CL 112* 108 106 111   < > 108 106 107  CO2 19* 23 22 22    < > 27 21* 26  GLUCOSE 99 104* 94 92   < > 95 130* 101  BUN 24* 20 28* 27*   < > 22 32* 29*  CREATININE 1.39* 1.34* 1.65* 1.42*   < > 1.60* 1.58* 1.73*  CALCIUM  9.5 9.3 9.3 9.2   < > 9.8 9.9 9.5  MG 1.9 2.1 2.1 2.1  --   --   --   --   PHOS 2.7  --   --   --   --   --   --   --    < > = values in this interval not displayed.   Liver Function Tests: Recent Labs    06/11/23 1500 06/22/23 1906 07/11/23 0802 07/12/23 1525 08/11/23 0925  AST 27 24 18 22 17   ALT 15 17 11 15 10   ALKPHOS 68 56  --  64   --   BILITOT 1.0 0.6 0.5 0.6 0.5  PROT 6.7 6.0* 5.6* 6.4* 5.9*  ALBUMIN 4.0 3.6  --  3.7  --    Recent Labs    06/22/23 1906 07/12/23 1525  LIPASE 43 42   No results for input(s): "AMMONIA" in the last 8760 hours. CBC: Recent Labs    07/11/23 0802 07/12/23 1525 08/11/23 0925  WBC 17.5* 21.5*  21.0* 20.0*  NEUTROABS 3,203 7.0 4,300  HGB 10.3* 10.0*  10.0* 10.8*  HCT 31.2* 30.1*  29.5* 32.8*  MCV 108.0* 109.5*  109.7* 107.2*  PLT 119* 145*  145* 125*   Lipid Panel: Recent Labs    07/11/23 0802  CHOL 172  HDL 43*  LDLCALC 107*  TRIG 120  CHOLHDL 4.0   Lab Results  Component Value Date   HGBA1C 5.4 08/02/2022    Procedures since last visit: No results found. Results   DIAGNOSTIC Colonoscopy: Notes indicate to avoid Miralax  Echocardiogram: Completed      Assessment/Plan 1. Essential hypertension, benign - cloNIDine  (CATAPRES  - DOSED IN MG/24 HR) 0.2 mg/24hr patch; Place 1 patch (0.2 mg total) onto the skin every 7 (seven) days.  Dispense: 4 patch; Refill: 11    Mesenteric Ischemia Chronic mesenteric ischemia managed with dietary modifications. She is uncertain about dietary triggers for diarrhea, suspecting Miralax  as a potential cause. No acute exacerbation. She manages the condition with proper diet but is unsure of safe foods due to recent diarrhea. - Advise dietary management to avoid exacerbation of mesenteric ischemia. - Discuss potential dietary triggers for diarrhea. - Encourage consumption of safe foods like salmon and consider probiotics.  Diarrhea Acute diarrhea likely secondary to excessive Miralax  use for constipation. She reports 3-4 episodes of pale brown diarrhea this week. No alarming features such as black or green stools. She has stopped Miralax  and is concerned about dietary triggers. -  Advise discontinuation of Miralax  until diarrhea resolves. - Consider reintroducing Miralax  on a reduced schedule (e.g., Monday, Wednesday, Friday) once  diarrhea resolves. - Evaluate dietary intake to identify potential triggers.  Weakness Generalized weakness possibly related to diarrhea and reduced oral intake. She reports significant fatigue, requiring extended rest periods. No leg swelling, and blood pressure is well-controlled at 132/68 mmHg. - Encourage adequate fluid intake to prevent dehydration. - Monitor for improvement in weakness as diarrhea resolves.     I spent greater than 30 minutes for the care of this patient in face to face time, chart review, clinical documentation, patient education.    Labs/tests ordered:  * No order type specified * Next appt:  11/09/2023

## 2023-09-14 NOTE — Patient Instructions (Signed)
 VISIT SUMMARY:  During your visit, we discussed your recent episodes of diarrhea, which you believe may be due to excessive use of Miralax  for constipation. You also mentioned feeling weak and fatigued, and we reviewed your dietary habits and medication regimen.  YOUR PLAN:  -MESENTERIC ISCHEMIA: Mesenteric ischemia is a condition where there is reduced blood flow to your intestines. We discussed managing your diet to avoid foods that might trigger your symptoms. You should continue eating safe foods like salmon and consider adding probiotics to your diet.  -DIARRHEA: Your diarrhea is likely due to taking too much Miralax . You should stop taking Miralax  until your diarrhea resolves. Once it does, you can reintroduce Miralax  on a reduced schedule, such as Monday, Wednesday, and Friday. We will also look at your diet to identify any other potential triggers.  -WEAKNESS: Your weakness may be related to your diarrhea and not eating enough. Make sure to drink plenty of fluids to stay hydrated. We will monitor your weakness to see if it improves as your diarrhea gets better.  INSTRUCTIONS:  Please follow up if your symptoms do not improve or if you experience any new or worsening symptoms. Make sure to stay hydrated and monitor your diet closely. If you have any concerns about your medications or their costs, please let us  know.

## 2023-10-04 ENCOUNTER — Other Ambulatory Visit: Payer: Self-pay | Admitting: *Deleted

## 2023-10-04 DIAGNOSIS — I10 Essential (primary) hypertension: Secondary | ICD-10-CM

## 2023-10-04 MED ORDER — CLONIDINE HCL 0.2 MG/24HR TD PTWK: 0.2000 mg | MEDICATED_PATCH | TRANSDERMAL | 1 refills | Status: DC

## 2023-10-04 NOTE — Telephone Encounter (Signed)
 Patient called requesting refill. Rx wound not go through escribe.   Called [facility] and gave verbal order.

## 2023-10-11 ENCOUNTER — Telehealth: Admitting: Student

## 2023-10-11 DIAGNOSIS — I10 Essential (primary) hypertension: Secondary | ICD-10-CM

## 2023-10-11 MED ORDER — CLONIDINE HCL 0.2 MG/24HR TD PTWK: 0.2000 mg | MEDICATED_PATCH | TRANSDERMAL | 1 refills | Status: DC

## 2023-10-11 NOTE — Telephone Encounter (Signed)
 Tried sending Escribe but will not go through. Called in Rx to [place] [facility].

## 2023-10-11 NOTE — Telephone Encounter (Signed)
 Copied from CRM [id number]. Topic: Clinical - Medication Refill >> Oct 11, 2023 11:21 AM Tiffany Martinez H wrote: Medication: cloNIDine  (CATAPRES  - DOSED IN MG/24 HR) 0.2 mg/24hr patch  Has the patient contacted their pharmacy? Yes (Agent: If no, request that the patient contact the pharmacy for the refill. If patient does not wish to contact the pharmacy document the reason why and proceed with request.) (Agent: If yes, when and what did the pharmacy advise?)  This is the patient's preferred pharmacy:  [facility] - [place], [place] - [address] [facility] [address] [place] [address] Phone: [phone] Fax: [phone] Hours: Not open 24 hours  Is this the correct pharmacy for this prescription? Yes If no, delete pharmacy and type the correct one.   Has the prescription been filled recently? Yes  Is the patient out of the medication? Yes  Has the patient been seen for an appointment in the last year OR does the patient have an upcoming appointment? Yes  Can we respond through MyChart? Yes  Agent: Please be advised that Rx refills may take up to 3 business days. We ask that you follow-up with your pharmacy.

## 2023-10-13 ENCOUNTER — Telehealth: Payer: Self-pay | Admitting: *Deleted

## 2023-10-13 NOTE — Telephone Encounter (Signed)
 She should have the higher dose.

## 2023-10-13 NOTE — Telephone Encounter (Signed)
 Niel Barrs with [facility] [phone] called wanting clarification on patient's Clonidine  patch. She stated that they have Clonidine  0.1mg /patch that they have been refilling but I called in 0.2mg /patch and she wanted to clarify that it was an increase.   I reviewed [facility] note from 4/30 and it does state Clonidine  0.2mg /patch.   Please Advise.

## 2023-10-14 NOTE — Telephone Encounter (Signed)
 Tiffany Martinez with [facility] Notified and agreed.

## 2023-10-25 NOTE — Progress Notes (Deleted)
 Cardiology Clinic Note   Date: 10/25/2023 ID: Novia Lansberry Mosley, DOB 1933/10/16, MRN [id number]  Primary Cardiologist:  Belva Boyden, MD  Chief Complaint   Naela Nodal Rossin is a 89 y.o. female who presents to the clinic today for ***  Patient Profile   Elis Rawlinson Rinks is followed by *** for the history outlined below.       Past medical history significant for: Chronic diastolic heart failure. Echo 06/12/2023: EF 50 to 55%.  LVH.  Grade II DD.  Normal RV size/function.  Moderate LAE. Concern for endocarditis. TEE 06/15/2023: No LA/LAA thrombus.  Mild to moderate MR/AI.  No evidence of interatrial shunt.  No valve vegetations noted. PAF. 14-day ZIO 01/06/2022: HR 41 to 154 bpm, average 66 bpm.  A-fib occurred <1% 55 to 101 bpm, average 70 bpm, longest 1 hour 47 minutes.  13 SVT runs fastest lasting 5 beats max rate 154 bpm longest 15 beats average 80 bpm.  Frequent PVCs 5% burden. Mesenteric ischemia. CTA abdomen pelvis 01/10/2022: > 90% stenosis origin of the celiac axis.  Mild stenosis proximal SMA.  9% stenosis origin of the left renal artery.  95% stenosis origin of right renal artery with interval moderate to marked right renal atrophy.  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 PAD. LEA ultrasound 12/21/2010: Severe right common iliac artery stenosis.  Mild left common iliac artery stenosis.  No evidence of significant infrainguinal arterial occlusive disease.  Right ABI in the mild range.  Left ABI is normal.  Incidental finding of saccular AAA 3.9 x 3.9 cm. CTA abdomen pelvis 01/10/2022: Extensive plaque formation causing severe stenosis of the origins of both common femoral arteries as well as the right femoral artery and right profunda femoris artery. AAA. AAA ultrasound 11/08/2011: Aneurysmal dilatation of the distal abdominal aorta with no significant change when compared to previous exam August 2012. S/p endovascular repair October 2013 performed at [facility]. CTA chest abdomen pelvis 06/13/2023: Status post aortobiiliac endovascular stent graft repair of infrarenal AAA.  Stent graft appears patent.  No periaortic fluid collection.  No significant interval change in the size of the excluded aneurysm sac. Hypertension. Hyperlipidemia. COPD. Dementia. CLL. CKD stage IIIb.  In summary, patient was initially seen by Dr. Arlester Ladd in August 2012 for PAD.  Lower extremity arterial ultrasound revealed severe right common iliac stenosis and no significant disease on the left.  There was an incidental finding of AAA.  Echo in May 2019 demonstrated EF 60 to 65%, no RWMA, Grade II DD mild AI, mild LAE, mildly increased PA pressure mmHg.  She established care with Dr. Gollan in March 2021.  Patient was seen for an office visit in August 2023 after leaving ED AMA.  She reported she had waited 6 hours prior to any care and decided to leave after she had an episode of syncope at home.  At the time of her visit she reported worsening fatigue and shortness of breath over a few months exacerbating balance issues.  14-day ZIO revealed A-fib and runs of SVT as detailed above and she was started on Eliquis  and Toprol .  Patient presented to the ED at the end of August 2023 with complaints of vomiting after eating and abdominal pain.  She underwent CTA abdomen pelvis as detailed above.  Images were reviewed by vascular surgery and given absence of ischemic findings she was referred back to her [facility] vascular surgeon.  She was discharged back to [facility].  Patient was seen in  the office on 06/21/2022 for routine follow-up.  She continued to complain of abdominal pain.  Unfortunately she is not a good surgical candidate for open procedures and not amenable to PCI.  She is able to tolerate soft mechanical foods.  She was euvolemic at the time of her visit.  BP was elevated and it was recommended she monitor it at home.  Patient presented to the ED on 06/10/2023 for syncopal event.   Patient reported she was getting her hair done when she passed out.  EMS found her heart rate in the 40s.  She was discharged home.  She returned to the ED the following day to follow-up on positive blood cultures.  Initial labs on 06/10/2023: WBC 29.3, hemoglobin 11.5, sodium 139, potassium 4.8, creatinine 1.55, BUN 36, BNP 260.6, negative respiratory panel, troponin negative x 2.  She underwent TEE which demonstrated no valve vegetations as detailed above.  She was discharged on 06/16/2023.  Patient presented to the ED on 06/22/2023 with complaints of abdominal pain, nausea, vomiting, diarrhea and dark stool.  Initial labs: WBC 29.3, hemoglobin 10.3, sodium 141, potassium 4.5, creatinine 1.63, BUN 27.  She was admitted for observation.  CT abdomen pelvis demonstrated layering stones or sludge in the gallbladder without evidence of acute cholecystitis.  Abdominal ultrasound demonstrated cholelithiasis without acute cholecystitis.  She underwent colonoscopy/EGD which demonstrated gastritis and several polyps were removed.  Patient declined GI capsule study.  She was discharged on 06/26/2023.  Patient presented to the ED from the cancer center on 07/12/2023 for nausea, vomiting, diarrhea with abdominal pain.  She was treated with antiemetic and discharged home.    Patient was last seen in the office by me on 07/27/2023 for hospital follow-up.  She reported increased palpitations since hospital discharge.  It appeared Toprol  had fallen off her medication list and correlated with her increase in symptoms.  She was restarted on Toprol .     History of Present Illness    Today, patient ***  Chronic diastolic heart failure Echo January 2025 with EF 50 to 55%, LVH, Grade II DD.  Patient denies lower extremity edema, dyspnea, orthopnea or PND.  Euvolemic and well compensated on exam.*** -Continue losartan .   PAF 14-day ZIO August 2023 demonstrated A-fib <1% with longest run 1 hour 47 minutes.  Denies spontaneous  bleeding concerns.  Patient*** - Continue Toprol  and Eliquis . Appropriate Eliquis  dose.   Mesenteric ischemia/PAD/AAA S/p endovascular repair of infrarenal AAA October 2013 performed at [facility].  Patient is followed by Dr. Myles Arvin. -Continue management with Dr. Myles Arvin.***   Hypertension BP today*** She denies headaches or dizziness.  -Continue amlodipine , clonidine , losartan .  ROS: All other systems reviewed and are otherwise negative except as noted in History of Present Illness.  EKGs/Labs Reviewed        08/11/2023: ALT 10; AST 17; BUN 29; Creat 1.73; Potassium 4.2; Sodium 140   08/11/2023: Hemoglobin 10.8; WBC 20.0   No results found for requested labs within last 365 days.   08/11/2023: Brain Natriuretic Peptide 177  ***  Risk Assessment/Calculations    {Does this patient have ATRIAL FIBRILLATION?:[phone]} No BP recorded.  {Refresh Note OR Click here to enter BP  :1}***        Physical Exam    VS:  LMP  (LMP Unknown)  , BMI There is no height or weight on file to calculate BMI.  GEN: Well nourished, well developed, in no acute distress. Neck: No JVD or carotid bruits. Cardiac: *** RRR. ***  No murmur. No rubs or gallops.   Respiratory:  Respirations regular and unlabored. Clear to auscultation without rales, wheezing or rhonchi. GI: Soft, nontender, nondistended. Extremities: Radials/DP/PT 2+ and equal bilaterally. No clubbing or cyanosis. No edema ***  Skin: Warm and dry, no rash. Neuro: Strength intact.  Assessment & Plan   ***  Disposition: ***     {Are you ordering a CV Procedure (e.g. stress test, cath, DCCV, TEE, etc)?   Press F2        :[id number]}   Signed, Lonell Rives. Esperanza Madrazo, DNP, NP-C

## 2023-10-27 ENCOUNTER — Ambulatory Visit: Admitting: Student

## 2023-11-03 NOTE — Progress Notes (Deleted)
 Cardiology Clinic Note   Date: 11/03/2023 ID: Tiffany Martinez, DOB 07-30-1933, MRN [id number]  Primary Cardiologist:  Belva Boyden, MD  Chief Complaint   Tiffany Martinez is a 89 y.o. female who presents to the clinic today for ***  Patient Profile   Tiffany Martinez is followed by *** for the history outlined below.       Past medical history significant for: Chronic diastolic heart failure. Echo 06/12/2023: EF 50 to 55%.  LVH.  Grade II DD.  Normal RV size/function.  Moderate LAE. Concern for endocarditis. TEE 06/15/2023: No LA/LAA thrombus.  Mild to moderate MR/AI.  No evidence of interatrial shunt.  No valve vegetations noted. PAF. 14-day ZIO 01/06/2022: HR 41 to 154 bpm, average 66 bpm.  A-fib occurred <1% 55 to 101 bpm, average 70 bpm, longest 1 hour 47 minutes.  13 SVT runs fastest lasting 5 beats max rate 154 bpm longest 15 beats average 80 bpm.  Frequent PVCs 5% burden. Mesenteric ischemia. CTA abdomen pelvis 01/10/2022: > 90% stenosis origin of the celiac axis.  Mild stenosis proximal SMA.  9% stenosis origin of the left renal artery.  95% stenosis origin of right renal artery with interval moderate to marked right renal atrophy.  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 PAD. LEA ultrasound 12/21/2010: Severe right common iliac artery stenosis.  Mild left common iliac artery stenosis.  No evidence of significant infrainguinal arterial occlusive disease.  Right ABI in the mild range.  Left ABI is normal.  Incidental finding of saccular AAA 3.9 x 3.9 cm. CTA abdomen pelvis 01/10/2022: Extensive plaque formation causing severe stenosis of the origins of both common femoral arteries as well as the right femoral artery and right profunda femoris artery. AAA. AAA ultrasound 11/08/2011: Aneurysmal dilatation of the distal abdominal aorta with no significant change when compared to previous exam August 2012. S/p endovascular repair October 2013 performed at [facility]. CTA chest abdomen pelvis 06/13/2023: Status post aortobiiliac endovascular stent graft repair of infrarenal AAA.  Stent graft appears patent.  No periaortic fluid collection.  No significant interval change in the size of the excluded aneurysm sac. Hypertension. Hyperlipidemia. COPD. Dementia. CLL. CKD stage IIIb.  In summary, patient was initially seen by Dr. Arlester Ladd in August 2012 for PAD.  Lower extremity arterial ultrasound revealed severe right common iliac stenosis and no significant disease on the left.  There was an incidental finding of AAA.  Echo in May 2019 demonstrated EF 60 to 65%, no RWMA, Grade II DD mild AI, mild LAE, mildly increased PA pressure mmHg.  She established care with Dr. Gollan in March 2021.  Patient was seen for an office visit in August 2023 after leaving ED AMA.  She reported she had waited 6 hours prior to any care and decided to leave after she had an episode of syncope at home.  At the time of her visit she reported worsening fatigue and shortness of breath over a few months exacerbating balance issues.  14-day ZIO revealed A-fib and runs of SVT as detailed above and she was started on Eliquis  and Toprol .  Patient presented to the ED at the end of August 2023 with complaints of vomiting after eating and abdominal pain.  She underwent CTA abdomen pelvis as detailed above.  Images were reviewed by vascular surgery and given absence of ischemic findings she was referred back to her [facility] vascular surgeon.  She was discharged back to [facility].  Patient was seen in  the office on 06/21/2022 for routine follow-up.  She continued to complain of abdominal pain.  Unfortunately she is not a good surgical candidate for open procedures and not amenable to PCI.  She is able to tolerate soft mechanical foods.  She was euvolemic at the time of her visit.  BP was elevated and it was recommended she monitor it at home.  Patient presented to the ED on 06/10/2023 for syncopal event.   Patient reported she was getting her hair done when she passed out.  EMS found her heart rate in the 40s.  She was discharged home.  She returned to the ED the following day to follow-up on positive blood cultures.  Initial labs on 06/10/2023: WBC 29.3, hemoglobin 11.5, sodium 139, potassium 4.8, creatinine 1.55, BUN 36, BNP 260.6, negative respiratory panel, troponin negative x 2.  She underwent TEE which demonstrated no valve vegetations as detailed above.  She was discharged on 06/16/2023.  Patient presented to the ED on 06/22/2023 with complaints of abdominal pain, nausea, vomiting, diarrhea and dark stool.  Initial labs: WBC 29.3, hemoglobin 10.3, sodium 141, potassium 4.5, creatinine 1.63, BUN 27.  She was admitted for observation.  CT abdomen pelvis demonstrated layering stones or sludge in the gallbladder without evidence of acute cholecystitis.  Abdominal ultrasound demonstrated cholelithiasis without acute cholecystitis.  She underwent colonoscopy/EGD which demonstrated gastritis and several polyps were removed.  Patient declined GI capsule study.  She was discharged on 06/26/2023.  Patient presented to the ED from the cancer center on 07/12/2023 for nausea, vomiting, diarrhea with abdominal pain.  She was treated with antiemetic and discharged home.    Patient was last seen in the office by me on 07/27/2023 for hospital follow-up.  She reported increased palpitations since hospital discharge.  It appeared Toprol  had fallen off her medication list and correlated with her increase in symptoms.  She was restarted on Toprol .     History of Present Illness    Today, patient ***  Chronic diastolic heart failure Echo January 2025 with EF 50 to 55%, LVH, Grade II DD.  Patient denies lower extremity edema, dyspnea, orthopnea or PND.  Euvolemic and well compensated on exam.*** -Continue losartan .   PAF 14-day ZIO August 2023 demonstrated A-fib <1% with longest run 1 hour 47 minutes.  Denies spontaneous  bleeding concerns.  Patient*** - Continue Toprol  and Eliquis . Appropriate Eliquis  dose.   Mesenteric ischemia/PAD/AAA S/p endovascular repair of infrarenal AAA October 2013 performed at [facility].  Patient is followed by Dr. Myles Arvin. -Continue management with Dr. Myles Arvin.***   Hypertension BP today*** She denies headaches or dizziness.  -Continue amlodipine , clonidine , losartan .  ROS: All other systems reviewed and are otherwise negative except as noted in History of Present Illness.  EKGs/Labs Reviewed        08/11/2023: ALT 10; AST 17; BUN 29; Creat 1.73; Potassium 4.2; Sodium 140   08/11/2023: Hemoglobin 10.8; WBC 20.0   No results found for requested labs within last 365 days.   08/11/2023: Brain Natriuretic Peptide 177  ***  Risk Assessment/Calculations    {Does this patient have ATRIAL FIBRILLATION?:[id number]} No BP recorded.  {Refresh Note OR Click here to enter BP  :1}***        Physical Exam    VS:  LMP  (LMP Unknown)  , BMI There is no height or weight on file to calculate BMI.  GEN: Well nourished, well developed, in no acute distress. Neck: No JVD or carotid bruits. Cardiac: *** RRR. ***  No murmur. No rubs or gallops.   Respiratory:  Respirations regular and unlabored. Clear to auscultation without rales, wheezing or rhonchi. GI: Soft, nontender, nondistended. Extremities: Radials/DP/PT 2+ and equal bilaterally. No clubbing or cyanosis. No edema ***  Skin: Warm and dry, no rash. Neuro: Strength intact.  Assessment & Plan   ***  Disposition: ***     {Are you ordering a CV Procedure (e.g. stress test, cath, DCCV, TEE, etc)?   Press F2        :[id number]}   Signed, Lonell Rives. Daryan Cagley, DNP, NP-C

## 2023-11-09 ENCOUNTER — Encounter: Admitting: Student

## 2023-11-10 ENCOUNTER — Ambulatory Visit: Admitting: Student

## 2023-12-27 ENCOUNTER — Telehealth: Payer: Self-pay | Admitting: *Deleted

## 2023-12-27 DIAGNOSIS — I10 Essential (primary) hypertension: Secondary | ICD-10-CM

## 2023-12-27 DIAGNOSIS — R110 Nausea: Secondary | ICD-10-CM

## 2023-12-27 NOTE — Telephone Encounter (Signed)
 Patient is being seen by Harlene on Thursday and she has ordered CBC and CMP to be done Thursday Morning at patient's home for no appetite.   Printed orders and placed in [facility] [employer].

## 2023-12-29 ENCOUNTER — Encounter: Payer: Self-pay | Admitting: Nurse Practitioner

## 2023-12-29 ENCOUNTER — Ambulatory Visit: Payer: Self-pay | Admitting: Nurse Practitioner

## 2023-12-29 ENCOUNTER — Ambulatory Visit: Admitting: Nurse Practitioner

## 2023-12-29 VITALS — Temp 97.4°F | Ht 64.0 in | Wt 118.4 lb

## 2023-12-29 DIAGNOSIS — K551 Chronic vascular disorders of intestine: Secondary | ICD-10-CM

## 2023-12-29 DIAGNOSIS — I10 Essential (primary) hypertension: Secondary | ICD-10-CM

## 2023-12-29 DIAGNOSIS — C9110 Chronic lymphocytic leukemia of B-cell type not having achieved remission: Secondary | ICD-10-CM | POA: Diagnosis not present

## 2023-12-29 DIAGNOSIS — E440 Moderate protein-calorie malnutrition: Secondary | ICD-10-CM | POA: Diagnosis not present

## 2023-12-29 DIAGNOSIS — J449 Chronic obstructive pulmonary disease, unspecified: Secondary | ICD-10-CM | POA: Diagnosis not present

## 2023-12-29 DIAGNOSIS — R110 Nausea: Secondary | ICD-10-CM | POA: Diagnosis not present

## 2023-12-29 NOTE — Progress Notes (Signed)
 Careteam: Patient Care Team: Abdul Fine, MD as PCP - General (Family Medicine) Perla Evalene PARAS, MD as PCP - Cardiology (Cardiology) Wonda Sharper, MD (Cardiology) Rennie Cindy SAUNDERS, MD as Consulting Physician (Oncology) PLACE OF SERVICE:  [facility]   Advanced Directive information Does Patient Have a Medical Advance Directive?: Yes, Type of Advance Directive: Healthcare Power of Attorney, Does patient want to make changes to medical advance directive?: No - Patient declined  Allergies  Allergen Reactions   Triamterene  Hives    Chief Complaint  Patient presents with   No Appetite    No Appetite. Weakness in the Morning. Loss of Weight. Has been out of Clonidine  Patch for a couple of weeks.      HPI: Patient is a 89 y.o. female seen in today tl clinic  Discussed the use of AI scribe software for clinical note transcription with the patient, who gave verbal consent to proceed.  History of Present Illness Tiffany Martinez is an 89 year old female with mesenteric ischemia and chronic lymphocytic leukemia who presents with dizziness and poor nutritional intake.   She experiences dizziness and lightheadedness, particularly in the mornings, which sometimes necessitates returning to bed. She also has shortness of breath, a new onset of cough and congestion, and feels cold, especially in her feet. No ankle swelling is noted. She is concerned about her blood pressure, feeling that it might be related to her dizziness.  She called EMS earlier this week due to feeling like she may pass out and they recommended that she go to the hospital but she declined due to weight times.   Her history of mesenteric ischemia affects her ability to eat. She eats only once a day, typically microwaving a meal for dinner, and consumes Boost for breakfast and ice cream for lunch. She has lost a significant amount of weight and struggles to find pants that fit. She feels unwell after  eating and has a history of vomiting and diarrhea, which she manages by returning to bed frequently. She drinks ginger ale but no water.  She has chronic lymphocytic leukemia, mild anemia, mild leukocytosis, and thrombocytopenia. 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 She is overdue for follow up with oncologist/hematologist.   She reports pain in her elbows and coccyx, which she attributes to weight loss and lack of muscle tone. The pain is particularly noticeable when getting up or sitting in certain positions. She has had a history of falls ad reports experienced multiple falls in her home in the past, resulting in four to five fractures, but has not fallen in the past week or month. She uses a walker named Charlie for assistance.  She has a history of COPD and continues to smoke.  She lives independently but receives assistance from [employer], who helps with grocery shopping on Tuesdays. She has difficulty cooking due to feeling lightheaded and weak, and relies on microwavable meals.      Review of Systems:  Review of Systems  Constitutional:  Positive for malaise/fatigue and weight loss. Negative for chills and fever.  HENT:  Negative for tinnitus.   Respiratory:  Positive for sputum production and shortness of breath. Negative for cough and hemoptysis.   Cardiovascular:  Negative for chest pain, palpitations and leg swelling.  Gastrointestinal:  Negative for abdominal pain, constipation, diarrhea and heartburn.  Genitourinary:  Negative for dysuria, frequency and urgency.  Musculoskeletal:  Positive for joint pain.  Negative for back pain, falls and myalgias.  Skin: Negative.   Neurological:  Positive for dizziness and weakness. Negative for headaches.  Psychiatric/Behavioral:  Positive for memory loss. Negative for depression. The patient does not have insomnia.     Past Medical History:  Diagnosis Date    AAA (abdominal aortic aneurysm) (HCC) 2012   3.9cm 2012; 2.8cm 12/2021   Anemia    Anxiety    CHF (congestive heart failure) (HCC)    LVEF 60-65% in 2023   Chronic venous insufficiency    CLL (chronic lymphocytic leukemia) (HCC) 2014   Stage 0--by flow cytometry   Hx of colonoscopy    Hyperlipidemia    Hypertension    Melanoma (HCC)    Right Arm   Migraine headache    3-4X/yr   PAD (peripheral artery disease) (HCC) 2012   severe R common iliac stenosis, ABI 0.73, mid L common iliac stenosis ABI 1.1   PAD (peripheral artery disease) (HCC)    Paroxysmal atrial fibrillation (HCC) 12/2021   Wears dentures    full upper and lower   Past Surgical History:  Procedure Laterality Date   ABDOMINAL AORTIC ANEURYSM REPAIR  02/16/12   [facility]--Dr Missy   BIOPSY  06/26/2023   Procedure: BIOPSY;  Surgeon: Onita Elspeth Sharper, DO;  Location: [facility] ENDOSCOPY;  Service: Gastroenterology;;   CARDIOVASCULAR STRESS TEST  2008   negative   CATARACT EXTRACTION W/PHACO Left 07/16/2019   Procedure: CATARACT EXTRACTION PHACO AND INTRAOCULAR LENS PLACEMENT (IOC) LEFT 8.26  00:46.6;  Surgeon: Myrna Adine Anes, MD;  Location: [facility];  Service: Ophthalmology;  Laterality: Left;   COLONOSCOPY WITH PROPOFOL  N/A 06/26/2023   Procedure: COLONOSCOPY WITH PROPOFOL ;  Surgeon: Onita Elspeth Sharper, DO;  Location: [facility] ENDOSCOPY;  Service: Gastroenterology;  Laterality: N/A;   DOBUTAMINE  STRESS ECHO  11/11   negative   ESOPHAGOGASTRODUODENOSCOPY (EGD) WITH PROPOFOL  N/A 06/26/2023   Procedure: ESOPHAGOGASTRODUODENOSCOPY (EGD) WITH PROPOFOL ;  Surgeon: Onita Elspeth Sharper, DO;  Location: [facility] ENDOSCOPY;  Service: Gastroenterology;  Laterality: N/A;   HIP ARTHROPLASTY Right 10/10/2019   Procedure: ARTHROPLASTY BIPOLAR HIP (HEMIARTHROPLASTY);  Surgeon: Edie Norleen PARAS, MD;  Location: [facility] ORS;  Service: Orthopedics;  Laterality: Right;   HOT HEMOSTASIS  06/26/2023   Procedure: HOT HEMOSTASIS (ARGON PLASMA  COAGULATION/BICAP);  Surgeon: Onita Elspeth Sharper, DO;  Location: [facility] ENDOSCOPY;  Service: Gastroenterology;;   KNEE ARTHROSCOPY  1998   right   MELANOMA EXCISION  11/09/10   wide excision right upper arm   SQUAMOUS CELL CARCINOMA EXCISION  02/2016   TEE WITHOUT CARDIOVERSION N/A 06/15/2023   Procedure: TRANSESOPHAGEAL ECHOCARDIOGRAM (TEE);  Surgeon: Perla Evalene PARAS, MD;  Location: [facility] ORS;  Service: Cardiovascular;  Laterality: N/A;   TONSILLECTOMY AND ADENOIDECTOMY  1960   Social History:   reports that she has been smoking cigarettes. She has never been exposed to tobacco smoke. She has never used smokeless tobacco. She reports current alcohol use of about 7.0 standard drinks of alcohol per week. She reports that she does not use drugs.  Family History  Problem Relation Age of Onset   Alzheimer's disease Mother    Pancreatic cancer Father    Parkinson's disease Brother    Coronary artery disease Neg Hx    Diabetes Neg Hx    Cancer Neg Hx        breast or colon    Medications: Patient's Medications  New Prescriptions   No medications on file  Previous Medications   AMLODIPINE  (NORVASC ) 5 MG  TABLET    Take 2 tablets (10 mg total) by mouth daily.   APIXABAN  (ELIQUIS ) 2.5 MG TABS TABLET    Take 1 tablet (2.5 mg total) by mouth 2 (two) times daily.   BUDESON-GLYCOPYRROLATE -FORMOTEROL (BREZTRI  AEROSPHERE) 160-9-4.8 MCG/ACT AERO    Inhale 2 puffs into the lungs 2 (two) times daily.   CLONIDINE  (CATAPRES  - DOSED IN MG/24 HR) 0.2 MG/24HR PATCH    Place 1 patch (0.2 mg total) onto the skin every 7 (seven) days.   LORATADINE  (CLARITIN ) 10 MG TABLET    Take 10 mg by mouth daily.   LOSARTAN  (COZAAR ) 100 MG TABLET    Take 1 tablet (100 mg total) by mouth daily.   METOPROLOL  SUCCINATE (TOPROL  XL) 25 MG 24 HR TABLET    Take 0.5 tablets (12.5 mg total) by mouth daily.   MULTIPLE VITAMIN (MULTIVITAMIN ADULT PO)    Take 1 tablet by mouth daily.   ONDANSETRON  (ZOFRAN -ODT) 8 MG DISINTEGRATING  TABLET    Take 1 tablet (8 mg total) by mouth every 8 (eight) hours as needed for nausea or vomiting.   PANTOPRAZOLE  (PROTONIX ) 40 MG TABLET    Take 1 tablet (40 mg total) by mouth 2 (two) times daily.  Modified Medications   No medications on file  Discontinued Medications   No medications on file    Physical Exam:  Vitals:   12/29/23 0954  BP: 128/72  Pulse: 61  Temp: (!) 97.4 F (36.3 C)  SpO2: 92%  Weight: 118 lb 6.4 oz (53.7 kg)  Height: 5' 4 (1.626 m)   Body mass index is 20.32 kg/m. Wt Readings from Last 3 Encounters:  12/29/23 118 lb 6.4 oz (53.7 kg)  09/14/23 131 lb (59.4 kg)  08/10/23 133 lb (60.3 kg)    Physical Exam Constitutional:      General: She is not in acute distress.    Appearance: She is well-developed. She is not diaphoretic.  HENT:     Head: Normocephalic and atraumatic.     Mouth/Throat:     Pharynx: No oropharyngeal exudate.  Eyes:     Conjunctiva/sclera: Conjunctivae normal.     Pupils: Pupils are equal, round, and reactive to light.  Cardiovascular:     Rate and Rhythm: Normal rate and regular rhythm.     Heart sounds: Normal heart sounds.  Pulmonary:     Effort: Pulmonary effort is normal.     Comments: diminished throughout  Abdominal:     General: Bowel sounds are normal.     Palpations: Abdomen is soft.  Musculoskeletal:     Cervical back: Normal range of motion and neck supple.     Right lower leg: No edema.     Left lower leg: No edema.  Skin:    General: Skin is warm and dry.  Neurological:     Mental Status: She is alert.     Motor: Weakness present.     Gait: Gait abnormal.     Comments: A&o x 3 but confusion noted  Psychiatric:        Mood and Affect: Mood normal.     Labs reviewed: Basic Metabolic Panel: Recent Labs    06/13/23 0500 06/14/23 0351 06/15/23 0332 06/16/23 0427 06/22/23 1906 07/11/23 0802 07/12/23 1525 08/11/23 0925  NA 140 138 139 140   < > 139 137 140  K 3.4* 3.5 3.6 3.4*   < > 3.8 4.0  4.2  CL 112* 108 106 111   < > 108  106 107  CO2 19* 23 22 22    < > 27 21* 26  GLUCOSE 99 104* 94 92   < > 95 130* 101  BUN 24* 20 28* 27*   < > 22 32* 29*  CREATININE 1.39* 1.34* 1.65* 1.42*   < > 1.60* 1.58* 1.73*  CALCIUM  9.5 9.3 9.3 9.2   < > 9.8 9.9 9.5  MG 1.9 2.1 2.1 2.1  --   --   --   --   PHOS 2.7  --   --   --   --   --   --   --    < > = values in this interval not displayed.   Liver Function Tests: Recent Labs    06/11/23 1500 06/22/23 1906 07/11/23 0802 07/12/23 1525 08/11/23 0925  AST 27 24 18 22 17   ALT 15 17 11 15 10   ALKPHOS 68 56  --  64  --   BILITOT 1.0 0.6 0.5 0.6 0.5  PROT 6.7 6.0* 5.6* 6.4* 5.9*  ALBUMIN 4.0 3.6  --  3.7  --    Recent Labs    06/22/23 1906 07/12/23 1525  LIPASE 43 42   No results for input(s): AMMONIA in the last 8760 hours. CBC: Recent Labs    07/11/23 0802 07/12/23 1525 08/11/23 0925  WBC 17.5* 21.5*  21.0* 20.0*  NEUTROABS 3,203 7.0 4,300  HGB 10.3* 10.0*  10.0* 10.8*  HCT 31.2* 30.1*  29.5* 32.8*  MCV 108.0* 109.5*  109.7* 107.2*  PLT 119* 145*  145* 125*   Lipid Panel: Recent Labs    07/11/23 0802  CHOL 172  HDL 43*  LDLCALC 107*  TRIG 120  CHOLHDL 4.0   TSH: No results for input(s): TSH in the last 8760 hours. A1C: Lab Results  Component Value Date   HGBA1C 5.4 08/02/2022     Assessment/Plan Assessment and Plan Assessment & Plan Malnutrition and decreased oral intake with unintentional weight loss Significant weight loss due to decreased oral intake, primarily microwavable meals and ice cream. Likely related to chronic mesenteric ischemia. Limited fluid intake, primarily ginger ale. Risk of falls due to weakness and dizziness from inadequate nutrition. - Encouraged increased oral intake and hydration. - Discussed potential admission to SNF for monitoring and nutritional support but she declines.  She also declines increase in home care and reports she will increase in oral intake and  hydration.  -[place] nurse aware and following.  No orthostatic hypotension  Cbc and cmp pending  Chronic lymphocytic leukemia  Chronic lymphocytic leukemia with anemia, leukocytosis, and thrombocytopenia. She missed follow-up with hematologist.  -encouraged her to make follow up with oncologist Dr Rennie  Chronic mesenteric ischemia with associated gastrointestinal symptoms Chronic mesenteric ischemia contributing to gastrointestinal symptoms and decreased oral intake. - she has coordinated with dietitian to find tolerable foods, encouraged to eat more throughout the day and to have home care assist.   Chronic obstructive pulmonary disease (COPD) with increased cough and shortness of breath COPD with increased cough and shortness of breath. Reports phlegm production. Smoking history noted. - Order chest x-ray to evaluate lung condition.  Hypertension -has not been using clonidine  patch Unsure about blood pressure regimen.  Stop clonidine  patch.    Next appt: 1 week with Dr Abdul Raisin K. Caro BODILY  [facility] [phone]

## 2023-12-29 NOTE — Patient Instructions (Addendum)
 STOP clonidine  patch  Increase oral intake-- need more water throughout the day To eat more during the day.   To go to outpatient imaging off [address]   To make follow up appt with Dr Lonn

## 2023-12-30 ENCOUNTER — Ambulatory Visit
Admission: RE | Admit: 2023-12-30 | Discharge: 2023-12-30 | Disposition: A | Discharge status: Home / Self Care | Source: Ambulatory Visit | Attending: Nurse Practitioner | Admitting: Nurse Practitioner

## 2023-12-30 ENCOUNTER — Ambulatory Visit
Admission: RE | Admit: 2023-12-30 | Discharge: 2023-12-30 | Disposition: A | Discharge status: Home / Self Care | Attending: Nurse Practitioner | Admitting: Nurse Practitioner

## 2023-12-30 DIAGNOSIS — J449 Chronic obstructive pulmonary disease, unspecified: Secondary | ICD-10-CM | POA: Diagnosis not present

## 2023-12-30 DIAGNOSIS — R0602 Shortness of breath: Secondary | ICD-10-CM | POA: Diagnosis not present

## 2023-12-30 DIAGNOSIS — R918 Other nonspecific abnormal finding of lung field: Secondary | ICD-10-CM | POA: Diagnosis not present

## 2023-12-30 DIAGNOSIS — R059 Cough, unspecified: Secondary | ICD-10-CM | POA: Diagnosis not present

## 2023-12-30 LAB — CBC WITH DIFFERENTIAL/PLATELET
Absolute Lymphocytes: 13563 {cells}/uL — ABNORMAL HIGH (ref 850–3900)
Absolute Monocytes: 344 {cells}/uL (ref 200–950)
Basophils Absolute: 33 {cells}/uL (ref 0–200)
Basophils Relative: 0.2 %
Eosinophils Absolute: 115 {cells}/uL (ref 15–500)
Eosinophils Relative: 0.7 %
HCT: 32 % — ABNORMAL LOW (ref 35.0–45.0)
Hemoglobin: 10.3 g/dL — ABNORMAL LOW (ref 11.7–15.5)
MCH: 34.9 pg — ABNORMAL HIGH (ref 27.0–33.0)
MCHC: 32.2 g/dL (ref 32.0–36.0)
MCV: 108.5 fL — ABNORMAL HIGH (ref 80.0–100.0)
MPV: 12 fL (ref 7.5–12.5)
Monocytes Relative: 2.1 %
Neutro Abs: 2345 {cells}/uL (ref 1500–7800)
Neutrophils Relative %: 14.3 %
Platelets: 98 Thousand/uL — ABNORMAL LOW (ref 140–400)
RBC: 2.95 Million/uL — ABNORMAL LOW (ref 3.80–5.10)
RDW: 14.1 % (ref 11.0–15.0)
Total Lymphocyte: 82.7 %
WBC: 16.4 Thousand/uL — ABNORMAL HIGH (ref 3.8–10.8)

## 2023-12-30 LAB — COMPREHENSIVE METABOLIC PANEL WITH GFR
AG Ratio: 2.1 (calc) (ref 1.0–2.5)
ALT: 11 U/L (ref 6–29)
AST: 20 U/L (ref 10–35)
Albumin: 3.8 g/dL (ref 3.6–5.1)
Alkaline phosphatase (APISO): 64 U/L (ref 37–153)
BUN/Creatinine Ratio: 12 (calc) (ref 6–22)
BUN: 20 mg/dL (ref 7–25)
CO2: 26 mmol/L (ref 20–32)
Calcium: 10 mg/dL (ref 8.6–10.4)
Chloride: 108 mmol/L (ref 98–110)
Creat: 1.64 mg/dL — ABNORMAL HIGH (ref 0.60–0.95)
Globulin: 1.8 g/dL — ABNORMAL LOW (ref 1.9–3.7)
Glucose, Bld: 88 mg/dL (ref 65–99)
Potassium: 4.1 mmol/L (ref 3.5–5.3)
Sodium: 139 mmol/L (ref 135–146)
Total Bilirubin: 0.5 mg/dL (ref 0.2–1.2)
Total Protein: 5.6 g/dL — ABNORMAL LOW (ref 6.1–8.1)
eGFR: 30 mL/min/1.73m2 — ABNORMAL LOW (ref >=60)

## 2024-01-03 ENCOUNTER — Ambulatory Visit: Payer: Self-pay | Admitting: Nurse Practitioner

## 2024-01-04 ENCOUNTER — Encounter: Payer: Self-pay | Admitting: Student

## 2024-01-04 ENCOUNTER — Ambulatory Visit: Admitting: Student

## 2024-01-04 VITALS — BP 130/62 | HR 66 | Temp 98.1°F | Ht 64.0 in | Wt 115.8 lb

## 2024-01-04 DIAGNOSIS — D638 Anemia in other chronic diseases classified elsewhere: Secondary | ICD-10-CM | POA: Diagnosis not present

## 2024-01-04 DIAGNOSIS — J449 Chronic obstructive pulmonary disease, unspecified: Secondary | ICD-10-CM

## 2024-01-04 DIAGNOSIS — E440 Moderate protein-calorie malnutrition: Secondary | ICD-10-CM | POA: Diagnosis not present

## 2024-01-04 DIAGNOSIS — F03B0 Unspecified dementia, moderate, without behavioral disturbance, psychotic disturbance, mood disturbance, and anxiety: Secondary | ICD-10-CM | POA: Diagnosis not present

## 2024-01-04 DIAGNOSIS — Z7189 Other specified counseling: Secondary | ICD-10-CM

## 2024-01-04 DIAGNOSIS — C9110 Chronic lymphocytic leukemia of B-cell type not having achieved remission: Secondary | ICD-10-CM | POA: Diagnosis not present

## 2024-01-04 DIAGNOSIS — K551 Chronic vascular disorders of intestine: Secondary | ICD-10-CM | POA: Diagnosis not present

## 2024-01-04 DIAGNOSIS — I480 Paroxysmal atrial fibrillation: Secondary | ICD-10-CM

## 2024-01-04 DIAGNOSIS — N1832 Chronic kidney disease, stage 3b: Secondary | ICD-10-CM

## 2024-01-04 DIAGNOSIS — I10 Essential (primary) hypertension: Secondary | ICD-10-CM

## 2024-01-04 MED ORDER — PANTOPRAZOLE SODIUM 40 MG PO TBEC: 40.0000 mg | DELAYED_RELEASE_TABLET | Freq: Every day | ORAL | 0 refills | Status: AC

## 2024-01-04 MED ORDER — AMLODIPINE BESYLATE 5 MG PO TABS
5.0000 mg | ORAL_TABLET | Freq: Every day | ORAL | 3 refills | Status: AC
Start: 2024-01-04 — End: ?

## 2024-01-04 MED ORDER — ONDANSETRON 8 MG PO TBDP: 8.0000 mg | ORAL_TABLET | Freq: Three times a day (TID) | ORAL | 0 refills | Status: AC | PRN

## 2024-01-04 NOTE — Patient Instructions (Signed)
 VISIT SUMMARY:  During your visit, we discussed your end-of-life care planning and reviewed your current health conditions, including mesenteric ischemia, chronic lymphocytic leukemia, chronic kidney disease, anemia, thrombocytopenia, malnutrition, dysphagia, constipation, hypertension, atrial fibrillation, gastroesophageal reflux disease, and allergic rhinitis. We also reviewed your medications and made some adjustments to better manage your symptoms and improve your quality of life.  YOUR PLAN:  -ADVANCE CARE PLANNING AND DNR STATUS: You have a Do Not Resuscitate (DNR) order and prefer to avoid CPR, intubation, and hospitalizations. You wish to receive hospice care at home if needed and to withhold life-prolonging measures and artificial nutrition or hydration. We will complete the MOST form and keep a copy on your refrigerator with your DNR paperwork. We will also ensure your healthcare and financial power of attorney information is updated in your medical chart and discuss hospice care options and criteria for eligibility.  -MESENTERIC ISCHEMIA: Mesenteric ischemia is a condition where there is reduced blood flow to your intestines, causing pain and digestive issues. You should continue managing your symptoms with small, blended meals. We have prescribed ondansetron  to help with nausea and vomiting as needed.  -CHRONIC LYMPHOCYTIC LEUKEMIA (CLL): Chronic lymphocytic leukemia is a type of cancer that affects your blood and bone marrow. Your white blood cell count has improved, and your hemoglobin levels are stable, though your platelets remain low. No changes to your current management are needed at this time.  -CHRONIC KIDNEY DISEASE STAGE 3B: Chronic kidney disease stage 3b means your kidneys are moderately damaged and not working as well as they should. Your kidney function is stable, but your protein levels indicate mild malnutrition. No changes to your current management are needed at this  time.  -ANEMIA IN CHRONIC DISEASE: Anemia in chronic disease is a condition where you have a lower than normal number of red blood cells due to your chronic illnesses. Your hemoglobin levels are stable, and no changes to your current management are needed at this time.  -THROMBOCYTOPENIA: Thrombocytopenia is a condition where you have a lower than normal number of platelets, which help your blood clot. Your platelet count is low but stable, and you have no acute bleeding issues. No changes to your current management are needed at this time.  -MALNUTRITION: Malnutrition means your body is not getting enough nutrients. Your albumin levels are low, indicating mild malnutrition. You should continue managing this with small, blended meals and nutritional supplements like Boost.  -DYSPHAGIA AND VOMITING: Dysphagia is difficulty swallowing, and it can lead to vomiting, especially if you eat certain foods or eat too quickly. You should continue managing this with small, blended meals. We have prescribed ondansetron  to help with nausea and vomiting as needed.  -CONSTIPATION: Constipation is when you have infrequent or difficult bowel movements. You should continue using Miralax  as needed to manage this.  -HYPERTENSION: Hypertension is high blood pressure. Your blood pressure is well-controlled with your current medications. We will reduce your amlodipine  to 5 mg daily and continue your other medications as prescribed.  -ATRIAL FIBRILLATION ON ANTICOAGULATION: Atrial fibrillation is an irregular heartbeat that increases your risk of stroke. You are taking Eliquis  to prevent strokes. We discussed the importance of continuing Eliquis , especially given your mesenteric ischemia and leukemia.  -GASTROESOPHAGEAL REFLUX DISEASE (GERD): GERD is a condition where stomach acid frequently flows back into the tube connecting your mouth and stomach, causing discomfort. You are currently managing this with Protonix . We will  reduce your Protonix  to once daily for two weeks, then  discontinue it.  -ALLERGIC RHINITIS: Allergic rhinitis is an allergic reaction that causes sneezing, congestion, and a runny nose. You should continue taking Claritin  to manage this.  INSTRUCTIONS:  Please complete the MOST form and keep a copy on your refrigerator with your DNR paperwork. Ensure your healthcare and financial power of attorney information is updated in your medical chart. We will discuss hospice care options and criteria for eligibility. Continue with your current dietary management and medications as adjusted. Follow up with us  if you experience any new or worsening symptoms.

## 2024-01-05 ENCOUNTER — Encounter: Payer: Self-pay | Admitting: Student

## 2024-01-05 ENCOUNTER — Telehealth: Payer: Self-pay

## 2024-01-05 NOTE — Progress Notes (Signed)
 Location:  [facility] POS: [facility] Provider: ABDUL  Code Status: DNR Goals of Care:     12/29/2023   10:00 AM  Advanced Directives  Does Patient Have a Medical Advance Directive? Yes  Type of Advance Directive Healthcare Power of Attorney  Does patient want to make changes to medical advance directive? No - Patient declined  Copy of Healthcare Power of Attorney in Chart? No - copy requested     Chief Complaint  Patient presents with   Medical Management of Chronic Issues    Medical Management of Chronic Issues. Follow up Weakness.     HPI: Patient is a 89 y.o. female seen today for medical management of chronic diseases.   Discussed the use of AI scribe software for clinical note transcription with the patient, who gave verbal consent to proceed.  History of Present Illness   Tiffany Martinez is an 89 year old female with mesenteric ischemia who presents for discussion of end-of-life care planning.  She has experienced a significant decline in her ability to perform daily activities, feeling extremely fatigued and unable to engage in any activities recently. She has no family in [place] and manages her affairs through a trust agent and an Pensions consultant. She is concerned about her condition of mesenteric ischemia and is seeking to make arrangements for her care in the event of severe illness.  Her history of mesenteric ischemia has been challenging, with periods of being able to eat well and other times when food does not sit well in her stomach. She recalls a past incident involving bacterial infections in her stomach, leading to a two-week hospital stay. During this time, she met multiple doctors and underwent various tests, which she found overwhelming. She respects a specific doctor, referred to as 'the fox terrier,' for her care during that time.  She manages her symptoms with dietary adjustments, primarily consuming ice cream and Boost, and is cautious with solids. She  occasionally eats small amounts of rotisserie chicken, which she finds manageable if eaten slowly. She uses a blender to make solids more digestible, which helps with her energy levels, although she still experiences fatigue and sleeps a lot. She uses Miralax  to manage her bowel movements but only takes it three times a week due to her limited food intake.  She has a history of chronic lymphocytic leukemia and chronic kidney disease stage 3B. Recent blood work shows stable kidney function, but her protein levels have worsened. Her white blood cell count has improved since March, but her hemoglobin remains stable at 10.3, indicating anemia. Her platelets are low at 98, which has been a consistent issue over the years.  She is currently on several medications, including losartan  and amlodipine  for blood pressure, Eliquis  for stroke prevention, and Zofran  for nausea. She wants to reduce her medication load, particularly wanting to cut down on Protonix  for acid reflux. She is cautious about stopping medications abruptly and is open to tapering them as needed.  No recent falls. She is careful with her movements. She experiences vomiting if she eats the wrong thing or eats too fast, but no recent diarrhea. Occasional phlegm in the morning but no current congestion.         Past Medical History:  Diagnosis Date   AAA (abdominal aortic aneurysm) (HCC) 2012   3.9cm 2012; 2.8cm 12/2021   Anemia    Anxiety    CHF (congestive heart failure) (HCC)    LVEF 60-65% in 2023  Chronic venous insufficiency    CLL (chronic lymphocytic leukemia) (HCC) 2014   Stage 0--by flow cytometry   Hx of colonoscopy    Hyperlipidemia    Hypertension    Melanoma (HCC)    Right Arm   Migraine headache    3-4X/yr   PAD (peripheral artery disease) (HCC) 2012   severe R common iliac stenosis, ABI 0.73, mid L common iliac stenosis ABI 1.1   PAD (peripheral artery disease) (HCC)    Paroxysmal atrial fibrillation (HCC)  12/2021   Wears dentures    full upper and lower    Past Surgical History:  Procedure Laterality Date   ABDOMINAL AORTIC ANEURYSM REPAIR  02/16/12   [facility]--Dr Missy   BIOPSY  06/26/2023   Procedure: BIOPSY;  Surgeon: Onita Elspeth Sharper, DO;  Location: [facility] ENDOSCOPY;  Service: Gastroenterology;;   CARDIOVASCULAR STRESS TEST  2008   negative   CATARACT EXTRACTION W/PHACO Left 07/16/2019   Procedure: CATARACT EXTRACTION PHACO AND INTRAOCULAR LENS PLACEMENT (IOC) LEFT 8.26  00:46.6;  Surgeon: Myrna Adine Anes, MD;  Location: [facility];  Service: Ophthalmology;  Laterality: Left;   COLONOSCOPY WITH PROPOFOL  N/A 06/26/2023   Procedure: COLONOSCOPY WITH PROPOFOL ;  Surgeon: Onita Elspeth Sharper, DO;  Location: [facility] ENDOSCOPY;  Service: Gastroenterology;  Laterality: N/A;   DOBUTAMINE  STRESS ECHO  11/11   negative   ESOPHAGOGASTRODUODENOSCOPY (EGD) WITH PROPOFOL  N/A 06/26/2023   Procedure: ESOPHAGOGASTRODUODENOSCOPY (EGD) WITH PROPOFOL ;  Surgeon: Onita Elspeth Sharper, DO;  Location: [facility] ENDOSCOPY;  Service: Gastroenterology;  Laterality: N/A;   HIP ARTHROPLASTY Right 10/10/2019   Procedure: ARTHROPLASTY BIPOLAR HIP (HEMIARTHROPLASTY);  Surgeon: Edie Norleen PARAS, MD;  Location: [facility] ORS;  Service: Orthopedics;  Laterality: Right;   HOT HEMOSTASIS  06/26/2023   Procedure: HOT HEMOSTASIS (ARGON PLASMA COAGULATION/BICAP);  Surgeon: Onita Elspeth Sharper, DO;  Location: [facility] ENDOSCOPY;  Service: Gastroenterology;;   KNEE ARTHROSCOPY  1998   right   MELANOMA EXCISION  11/09/10   wide excision right upper arm   SQUAMOUS CELL CARCINOMA EXCISION  02/2016   TEE WITHOUT CARDIOVERSION N/A 06/15/2023   Procedure: TRANSESOPHAGEAL ECHOCARDIOGRAM (TEE);  Surgeon: Perla Evalene PARAS, MD;  Location: [facility] ORS;  Service: Cardiovascular;  Laterality: N/A;   TONSILLECTOMY AND ADENOIDECTOMY  1960    Allergies  Allergen Reactions   Triamterene  Hives    Outpatient Encounter Medications as of 01/04/2024   Medication Sig   apixaban  (ELIQUIS ) 2.5 MG TABS tablet Take 1 tablet (2.5 mg total) by mouth 2 (two) times daily.   losartan  (COZAAR ) 100 MG tablet Take 1 tablet (100 mg total) by mouth daily.   metoprolol  succinate (TOPROL  XL) 25 MG 24 hr tablet Take 0.5 tablets (12.5 mg total) by mouth daily.   [DISCONTINUED] amLODipine  (NORVASC ) 5 MG tablet Take 2 tablets (10 mg total) by mouth daily.   [DISCONTINUED] Multiple Vitamin (MULTIVITAMIN ADULT PO) Take 1 tablet by mouth daily.   [DISCONTINUED] ondansetron  (ZOFRAN -ODT) 8 MG disintegrating tablet Take 1 tablet (8 mg total) by mouth every 8 (eight) hours as needed for nausea or vomiting.   [DISCONTINUED] pantoprazole  (PROTONIX ) 40 MG tablet Take 1 tablet (40 mg total) by mouth 2 (two) times daily.   amLODipine  (NORVASC ) 5 MG tablet Take 1 tablet (5 mg total) by mouth daily.   loratadine  (CLARITIN ) 10 MG tablet Take 10 mg by mouth daily.   ondansetron  (ZOFRAN -ODT) 8 MG disintegrating tablet Take 1 tablet (8 mg total) by mouth every 8 (eight) hours as needed for nausea or vomiting.  pantoprazole  (PROTONIX ) 40 MG tablet Take 1 tablet (40 mg total) by mouth daily for 14 days.   [DISCONTINUED] budeson-glycopyrrolate -formoterol (BREZTRI  AEROSPHERE) 160-9-4.8 MCG/ACT AERO Inhale 2 puffs into the lungs 2 (two) times daily. (Patient not taking: Reported on 01/04/2024)   No facility-administered encounter medications on file as of 01/04/2024.    Review of Systems:  Review of Systems  Health Maintenance  Topic Date Due   INFLUENZA VACCINE  12/16/2023   Medicare Annual Wellness (AWV)  01/13/2024   COVID-19 Vaccine (5 - 2024-25 season) 07/20/2024 (Originally 01/16/2023)   Zoster Vaccines- Shingrix  (1 of 2) 09/28/2024 (Originally 03/04/1953)   DTaP/Tdap/Td (3 - Tdap) 01/16/2024   Pneumococcal Vaccine: 50+ Years  Completed   DEXA SCAN  Completed   HPV VACCINES  Aged Out   Meningococcal B Vaccine  Aged Out    Physical Exam: Vitals:   01/04/24 1448  BP:  130/62  Pulse: 66  Temp: 98.1 F (36.7 C)  SpO2: 98%  Weight: 115 lb 12.8 oz (52.5 kg)  Height: 5' 4 (1.626 m)   Body mass index is 19.88 kg/m. Physical Exam Cardiovascular:     Rate and Rhythm: Normal rate.  Pulmonary:     Effort: Pulmonary effort is normal.     Breath sounds: Wheezing present.  Abdominal:     General: Abdomen is flat.     Palpations: Abdomen is soft.  Neurological:     Mental Status: She is alert and oriented to person, place, and time.     Gait: Gait abnormal.    Labs reviewed: Basic Metabolic Panel: Recent Labs    06/13/23 0500 06/14/23 0351 06/15/23 0332 06/16/23 0427 06/22/23 1906 07/12/23 1525 08/11/23 0925 12/29/23 0805  NA 140 138 139 140   < > 137 140 139  K 3.4* 3.5 3.6 3.4*   < > 4.0 4.2 4.1  CL 112* 108 106 111   < > 106 107 108  CO2 19* 23 22 22    < > 21* 26 26  GLUCOSE 99 104* 94 92   < > 130* 101 88  BUN 24* 20 28* 27*   < > 32* 29* 20  CREATININE 1.39* 1.34* 1.65* 1.42*   < > 1.58* 1.73* 1.64*  CALCIUM  9.5 9.3 9.3 9.2   < > 9.9 9.5 10.0  MG 1.9 2.1 2.1 2.1  --   --   --   --   PHOS 2.7  --   --   --   --   --   --   --    < > = values in this interval not displayed.   Liver Function Tests: Recent Labs    06/11/23 1500 06/22/23 1906 07/11/23 0802 07/12/23 1525 08/11/23 0925 12/29/23 0805  AST 27 24   < > 22 17 20   ALT 15 17   < > 15 10 11   ALKPHOS 68 56  --  64  --   --   BILITOT 1.0 0.6   < > 0.6 0.5 0.5  PROT 6.7 6.0*   < > 6.4* 5.9* 5.6*  ALBUMIN 4.0 3.6  --  3.7  --   --    < > = values in this interval not displayed.   Recent Labs    06/22/23 1906 07/12/23 1525  LIPASE 43 42   No results for input(s): AMMONIA in the last 8760 hours. CBC: Recent Labs    07/12/23 1525 08/11/23 0925 12/29/23 0805  WBC 21.5*  21.0* 20.0*  16.4*  NEUTROABS 7.0 4,300 2,345  HGB 10.0*  10.0* 10.8* 10.3*  HCT 30.1*  29.5* 32.8* 32.0*  MCV 109.5*  109.7* 107.2* 108.5*  PLT 145*  145* 125* 98*   Lipid  Panel: Recent Labs    07/11/23 0802  CHOL 172  HDL 43*  LDLCALC 107*  TRIG 120  CHOLHDL 4.0   Lab Results  Component Value Date   HGBA1C 5.4 08/02/2022    Procedures since last visit: DG Chest 2 View Result Date: 01/03/2024 CLINICAL DATA:  Cough.  Shortness of breath. EXAM: CHEST - 2 VIEW COMPARISON:  07/12/2023 and older exams.  CT, 06/13/2023. FINDINGS: Cardiac silhouette is normal in size and configuration. Cyst no mediastinal or hilar masses. No evidence of adenopathy. Lungs are hyperexpanded, but clear. No pleural effusion or pneumothorax. Skeletal structures are demineralized but intact. IMPRESSION: 1. No acute cardiopulmonary disease. 2. Hyperexpanded lungs consistent with COPD. Electronically Signed   By: Alm Parkins M.D.   On: 01/03/2024 12:52   Results   LABS WBC: 16.4 (12/29/2023) Hb: 10.3 (12/29/2023) PLT: 98 (12/29/2023) Albumin: 2.7 (12/29/2023)  RADIOLOGY Chest X-ray: Clear (12/28/2023)      Assessment/Plan   Advance care planning and DNR status She has a DNR order and prefers to avoid CPR, intubation, hospitalizations, and desires hospice care at home if needed. Wishes to withhold life-prolonging measures and artificial nutrition or hydration. Healthcare and financial power of attorney are in place. - Complete MOST form and keep a copy on the refrigerator with DNR paperwork - Ensure healthcare power of attorney and financial power of attorney information is updated in the medical chart - Discuss hospice care options and criteria for eligibility  Mesenteric ischemia Chronic condition with episodes of vomiting and dietary restrictions. Manages symptoms with small, blended meals. Experiences fatigue and low energy levels. - Continue dietary management with small, blended meals - Prescribe ondansetron  for nausea and vomiting as needed  Chronic lymphocytic leukemia (CLL) Chronic condition with improved but elevated white blood cell count, stable hemoglobin, and  low platelets.  Chronic kidney disease stage 3b Chronic condition with well-managed kidney function. Albumin levels indicate mild malnutrition but not at hospice criteria levels.  Anemia in chronic disease Chronic anemia with well-managed hemoglobin levels.  Thrombocytopenia Chronic low platelet count, well-managed without acute bleeding issues.  Malnutrition Mild malnutrition with low albumin levels. Dietary intake limited due to mesenteric ischemia and dysphagia. Manages with small, blended meals and nutritional supplements like Boost.  Dysphagia and vomiting Chronic issue related to mesenteric ischemia. Experiences vomiting episodes after eating certain foods or eating too quickly. Manages with small, blended meals. - Continue dietary management with small, blended meals - Prescribe ondansetron  for nausea and vomiting as needed  Constipation Managed with Miralax  as needed. Frequency reduced due to limited dietary intake. - Continue Miralax  as needed for constipation  Hypertension Blood pressure is well-controlled with current medication regimen. Recent reduction allows for medication adjustment. - Reduce amlodipine  to 5 mg daily - Continue losartan  and metoprolol  as prescribed  Atrial fibrillation on anticoagulation Managed with Eliquis  for stroke prevention. Discussed cost and importance of Eliquis , especially given mesenteric ischemia and leukemia. - Continue Eliquis  for anticoagulation and stroke prevention  Gastroesophageal reflux disease (GERD) Managed with Protonix . Desires to reduce medication use. - Reduce Protonix  to once daily for two weeks, then discontinue  Allergic rhinitis Managed with Claritin . - Continue Claritin  for allergic rhinitis     COPD Patient does not want to continue treatment for COPD with inhalers.  Will discontinue at this time.    Labs/tests ordered:  * No order type specified * Next appt:  01/05/2024  I spent greater than 30  minutes  for the care of this patient in face to face time, chart review, clinical documentation, patient education. I spent an additional 60 minutes discussing goals of care and advanced care planning.

## 2024-01-05 NOTE — Telephone Encounter (Signed)
 Copied from CRM #[id number]. Topic: Clinical - Prescription Issue >> Jan 05, 2024 10:10 AM Vivian Z wrote: Reason for CRM: Murvin calling from [facility] regarding pantoprazole  (PROTONIX ) 40 MG tablet. She stated that patient is currently on this medication for twice a day but new prescription is once a day for 14 days. Murvin would like callback to clarify prescription.   Callback: [phone]

## 2024-01-09 NOTE — Telephone Encounter (Signed)
 Patient is working on discontinuation of the medication. We are working on lowering the dose.

## 2024-01-11 DIAGNOSIS — F5000 Anorexia nervosa, unspecified: Secondary | ICD-10-CM | POA: Diagnosis not present

## 2024-01-11 DIAGNOSIS — I10 Essential (primary) hypertension: Secondary | ICD-10-CM | POA: Diagnosis not present

## 2024-01-11 DIAGNOSIS — E785 Hyperlipidemia, unspecified: Secondary | ICD-10-CM | POA: Diagnosis not present

## 2024-01-11 DIAGNOSIS — K551 Chronic vascular disorders of intestine: Secondary | ICD-10-CM | POA: Diagnosis not present

## 2024-01-11 DIAGNOSIS — I739 Peripheral vascular disease, unspecified: Secondary | ICD-10-CM | POA: Diagnosis not present

## 2024-01-11 DIAGNOSIS — I4891 Unspecified atrial fibrillation: Secondary | ICD-10-CM | POA: Diagnosis not present

## 2024-01-11 DIAGNOSIS — E440 Moderate protein-calorie malnutrition: Secondary | ICD-10-CM | POA: Diagnosis not present

## 2024-01-11 DIAGNOSIS — D649 Anemia, unspecified: Secondary | ICD-10-CM | POA: Diagnosis not present

## 2024-01-11 DIAGNOSIS — J302 Other seasonal allergic rhinitis: Secondary | ICD-10-CM | POA: Diagnosis not present

## 2024-01-11 DIAGNOSIS — J449 Chronic obstructive pulmonary disease, unspecified: Secondary | ICD-10-CM | POA: Diagnosis not present

## 2024-01-11 DIAGNOSIS — R634 Abnormal weight loss: Secondary | ICD-10-CM | POA: Diagnosis not present

## 2024-01-11 DIAGNOSIS — C9110 Chronic lymphocytic leukemia of B-cell type not having achieved remission: Secondary | ICD-10-CM | POA: Diagnosis not present

## 2024-01-11 NOTE — Telephone Encounter (Signed)
 Call returned to the pharmacy, spoke with Macario and informed her of Abdul Fine, MD response

## 2024-01-12 DIAGNOSIS — R634 Abnormal weight loss: Secondary | ICD-10-CM | POA: Diagnosis not present

## 2024-01-12 DIAGNOSIS — J449 Chronic obstructive pulmonary disease, unspecified: Secondary | ICD-10-CM | POA: Diagnosis not present

## 2024-01-12 DIAGNOSIS — K551 Chronic vascular disorders of intestine: Secondary | ICD-10-CM | POA: Diagnosis not present

## 2024-01-12 DIAGNOSIS — I10 Essential (primary) hypertension: Secondary | ICD-10-CM | POA: Diagnosis not present

## 2024-01-12 DIAGNOSIS — I4891 Unspecified atrial fibrillation: Secondary | ICD-10-CM | POA: Diagnosis not present

## 2024-01-12 DIAGNOSIS — E440 Moderate protein-calorie malnutrition: Secondary | ICD-10-CM | POA: Diagnosis not present

## 2024-01-16 DIAGNOSIS — I10 Essential (primary) hypertension: Secondary | ICD-10-CM | POA: Diagnosis not present

## 2024-01-16 DIAGNOSIS — D649 Anemia, unspecified: Secondary | ICD-10-CM | POA: Diagnosis not present

## 2024-01-16 DIAGNOSIS — E440 Moderate protein-calorie malnutrition: Secondary | ICD-10-CM | POA: Diagnosis not present

## 2024-01-16 DIAGNOSIS — K551 Chronic vascular disorders of intestine: Secondary | ICD-10-CM | POA: Diagnosis not present

## 2024-01-16 DIAGNOSIS — R634 Abnormal weight loss: Secondary | ICD-10-CM | POA: Diagnosis not present

## 2024-01-16 DIAGNOSIS — I4891 Unspecified atrial fibrillation: Secondary | ICD-10-CM | POA: Diagnosis not present

## 2024-01-16 DIAGNOSIS — I739 Peripheral vascular disease, unspecified: Secondary | ICD-10-CM | POA: Diagnosis not present

## 2024-01-16 DIAGNOSIS — J449 Chronic obstructive pulmonary disease, unspecified: Secondary | ICD-10-CM | POA: Diagnosis not present

## 2024-01-16 DIAGNOSIS — F5000 Anorexia nervosa, unspecified: Secondary | ICD-10-CM | POA: Diagnosis not present

## 2024-01-16 DIAGNOSIS — C9110 Chronic lymphocytic leukemia of B-cell type not having achieved remission: Secondary | ICD-10-CM | POA: Diagnosis not present

## 2024-01-16 DIAGNOSIS — J302 Other seasonal allergic rhinitis: Secondary | ICD-10-CM | POA: Diagnosis not present

## 2024-01-16 DIAGNOSIS — E785 Hyperlipidemia, unspecified: Secondary | ICD-10-CM | POA: Diagnosis not present

## 2024-01-17 DIAGNOSIS — K551 Chronic vascular disorders of intestine: Secondary | ICD-10-CM | POA: Diagnosis not present

## 2024-01-17 DIAGNOSIS — I4891 Unspecified atrial fibrillation: Secondary | ICD-10-CM | POA: Diagnosis not present

## 2024-01-17 DIAGNOSIS — R634 Abnormal weight loss: Secondary | ICD-10-CM | POA: Diagnosis not present

## 2024-01-17 DIAGNOSIS — J449 Chronic obstructive pulmonary disease, unspecified: Secondary | ICD-10-CM | POA: Diagnosis not present

## 2024-01-17 DIAGNOSIS — I10 Essential (primary) hypertension: Secondary | ICD-10-CM | POA: Diagnosis not present

## 2024-01-17 DIAGNOSIS — E440 Moderate protein-calorie malnutrition: Secondary | ICD-10-CM | POA: Diagnosis not present

## 2024-01-19 ENCOUNTER — Encounter: Payer: Medicare Other | Admitting: Nurse Practitioner

## 2024-01-19 DIAGNOSIS — I4891 Unspecified atrial fibrillation: Secondary | ICD-10-CM | POA: Diagnosis not present

## 2024-01-19 DIAGNOSIS — R634 Abnormal weight loss: Secondary | ICD-10-CM | POA: Diagnosis not present

## 2024-01-19 DIAGNOSIS — J449 Chronic obstructive pulmonary disease, unspecified: Secondary | ICD-10-CM | POA: Diagnosis not present

## 2024-01-19 DIAGNOSIS — K551 Chronic vascular disorders of intestine: Secondary | ICD-10-CM | POA: Diagnosis not present

## 2024-01-19 DIAGNOSIS — I10 Essential (primary) hypertension: Secondary | ICD-10-CM | POA: Diagnosis not present

## 2024-01-19 DIAGNOSIS — E440 Moderate protein-calorie malnutrition: Secondary | ICD-10-CM | POA: Diagnosis not present

## 2024-01-20 DIAGNOSIS — K551 Chronic vascular disorders of intestine: Secondary | ICD-10-CM | POA: Diagnosis not present

## 2024-01-20 DIAGNOSIS — E440 Moderate protein-calorie malnutrition: Secondary | ICD-10-CM | POA: Diagnosis not present

## 2024-01-20 DIAGNOSIS — J449 Chronic obstructive pulmonary disease, unspecified: Secondary | ICD-10-CM | POA: Diagnosis not present

## 2024-01-20 DIAGNOSIS — I4891 Unspecified atrial fibrillation: Secondary | ICD-10-CM | POA: Diagnosis not present

## 2024-01-20 DIAGNOSIS — R634 Abnormal weight loss: Secondary | ICD-10-CM | POA: Diagnosis not present

## 2024-01-20 DIAGNOSIS — I10 Essential (primary) hypertension: Secondary | ICD-10-CM | POA: Diagnosis not present

## 2024-01-23 DIAGNOSIS — E440 Moderate protein-calorie malnutrition: Secondary | ICD-10-CM | POA: Diagnosis not present

## 2024-01-23 DIAGNOSIS — J449 Chronic obstructive pulmonary disease, unspecified: Secondary | ICD-10-CM | POA: Diagnosis not present

## 2024-01-23 DIAGNOSIS — I4891 Unspecified atrial fibrillation: Secondary | ICD-10-CM | POA: Diagnosis not present

## 2024-01-23 DIAGNOSIS — K551 Chronic vascular disorders of intestine: Secondary | ICD-10-CM | POA: Diagnosis not present

## 2024-01-23 DIAGNOSIS — R634 Abnormal weight loss: Secondary | ICD-10-CM | POA: Diagnosis not present

## 2024-01-23 DIAGNOSIS — I10 Essential (primary) hypertension: Secondary | ICD-10-CM | POA: Diagnosis not present

## 2024-01-26 DIAGNOSIS — E440 Moderate protein-calorie malnutrition: Secondary | ICD-10-CM | POA: Diagnosis not present

## 2024-01-26 DIAGNOSIS — I10 Essential (primary) hypertension: Secondary | ICD-10-CM | POA: Diagnosis not present

## 2024-01-26 DIAGNOSIS — I4891 Unspecified atrial fibrillation: Secondary | ICD-10-CM | POA: Diagnosis not present

## 2024-01-26 DIAGNOSIS — J449 Chronic obstructive pulmonary disease, unspecified: Secondary | ICD-10-CM | POA: Diagnosis not present

## 2024-01-26 DIAGNOSIS — R634 Abnormal weight loss: Secondary | ICD-10-CM | POA: Diagnosis not present

## 2024-01-26 DIAGNOSIS — K551 Chronic vascular disorders of intestine: Secondary | ICD-10-CM | POA: Diagnosis not present

## 2024-01-30 DIAGNOSIS — K551 Chronic vascular disorders of intestine: Secondary | ICD-10-CM | POA: Diagnosis not present

## 2024-01-30 DIAGNOSIS — I4891 Unspecified atrial fibrillation: Secondary | ICD-10-CM | POA: Diagnosis not present

## 2024-01-30 DIAGNOSIS — E440 Moderate protein-calorie malnutrition: Secondary | ICD-10-CM | POA: Diagnosis not present

## 2024-01-30 DIAGNOSIS — I10 Essential (primary) hypertension: Secondary | ICD-10-CM | POA: Diagnosis not present

## 2024-01-30 DIAGNOSIS — J449 Chronic obstructive pulmonary disease, unspecified: Secondary | ICD-10-CM | POA: Diagnosis not present

## 2024-01-30 DIAGNOSIS — R634 Abnormal weight loss: Secondary | ICD-10-CM | POA: Diagnosis not present

## 2024-02-02 DIAGNOSIS — R634 Abnormal weight loss: Secondary | ICD-10-CM | POA: Diagnosis not present

## 2024-02-02 DIAGNOSIS — I4891 Unspecified atrial fibrillation: Secondary | ICD-10-CM | POA: Diagnosis not present

## 2024-02-02 DIAGNOSIS — J449 Chronic obstructive pulmonary disease, unspecified: Secondary | ICD-10-CM | POA: Diagnosis not present

## 2024-02-02 DIAGNOSIS — K551 Chronic vascular disorders of intestine: Secondary | ICD-10-CM | POA: Diagnosis not present

## 2024-02-02 DIAGNOSIS — I10 Essential (primary) hypertension: Secondary | ICD-10-CM | POA: Diagnosis not present

## 2024-02-02 DIAGNOSIS — E440 Moderate protein-calorie malnutrition: Secondary | ICD-10-CM | POA: Diagnosis not present

## 2024-02-03 ENCOUNTER — Encounter: Admitting: Student

## 2024-02-03 DIAGNOSIS — I10 Essential (primary) hypertension: Secondary | ICD-10-CM | POA: Diagnosis not present

## 2024-02-03 DIAGNOSIS — J449 Chronic obstructive pulmonary disease, unspecified: Secondary | ICD-10-CM | POA: Diagnosis not present

## 2024-02-03 DIAGNOSIS — R634 Abnormal weight loss: Secondary | ICD-10-CM | POA: Diagnosis not present

## 2024-02-03 DIAGNOSIS — E440 Moderate protein-calorie malnutrition: Secondary | ICD-10-CM | POA: Diagnosis not present

## 2024-02-03 DIAGNOSIS — K551 Chronic vascular disorders of intestine: Secondary | ICD-10-CM | POA: Diagnosis not present

## 2024-02-03 DIAGNOSIS — I4891 Unspecified atrial fibrillation: Secondary | ICD-10-CM | POA: Diagnosis not present

## 2024-02-06 DIAGNOSIS — R634 Abnormal weight loss: Secondary | ICD-10-CM | POA: Diagnosis not present

## 2024-02-06 DIAGNOSIS — I4891 Unspecified atrial fibrillation: Secondary | ICD-10-CM | POA: Diagnosis not present

## 2024-02-06 DIAGNOSIS — K551 Chronic vascular disorders of intestine: Secondary | ICD-10-CM | POA: Diagnosis not present

## 2024-02-06 DIAGNOSIS — I10 Essential (primary) hypertension: Secondary | ICD-10-CM | POA: Diagnosis not present

## 2024-02-06 DIAGNOSIS — J449 Chronic obstructive pulmonary disease, unspecified: Secondary | ICD-10-CM | POA: Diagnosis not present

## 2024-02-06 DIAGNOSIS — E440 Moderate protein-calorie malnutrition: Secondary | ICD-10-CM | POA: Diagnosis not present

## 2024-02-08 ENCOUNTER — Encounter: Admitting: Student

## 2024-02-08 DIAGNOSIS — I4891 Unspecified atrial fibrillation: Secondary | ICD-10-CM | POA: Diagnosis not present

## 2024-02-08 DIAGNOSIS — J449 Chronic obstructive pulmonary disease, unspecified: Secondary | ICD-10-CM | POA: Diagnosis not present

## 2024-02-08 DIAGNOSIS — K551 Chronic vascular disorders of intestine: Secondary | ICD-10-CM | POA: Diagnosis not present

## 2024-02-08 DIAGNOSIS — I10 Essential (primary) hypertension: Secondary | ICD-10-CM | POA: Diagnosis not present

## 2024-02-08 DIAGNOSIS — R634 Abnormal weight loss: Secondary | ICD-10-CM | POA: Diagnosis not present

## 2024-02-08 DIAGNOSIS — E440 Moderate protein-calorie malnutrition: Secondary | ICD-10-CM | POA: Diagnosis not present

## 2024-02-10 DIAGNOSIS — I4891 Unspecified atrial fibrillation: Secondary | ICD-10-CM | POA: Diagnosis not present

## 2024-02-10 DIAGNOSIS — K551 Chronic vascular disorders of intestine: Secondary | ICD-10-CM | POA: Diagnosis not present

## 2024-02-10 DIAGNOSIS — E440 Moderate protein-calorie malnutrition: Secondary | ICD-10-CM | POA: Diagnosis not present

## 2024-02-10 DIAGNOSIS — J449 Chronic obstructive pulmonary disease, unspecified: Secondary | ICD-10-CM | POA: Diagnosis not present

## 2024-02-10 DIAGNOSIS — I10 Essential (primary) hypertension: Secondary | ICD-10-CM | POA: Diagnosis not present

## 2024-02-10 DIAGNOSIS — R634 Abnormal weight loss: Secondary | ICD-10-CM | POA: Diagnosis not present

## 2024-02-13 DIAGNOSIS — R634 Abnormal weight loss: Secondary | ICD-10-CM | POA: Diagnosis not present

## 2024-02-13 DIAGNOSIS — I4891 Unspecified atrial fibrillation: Secondary | ICD-10-CM | POA: Diagnosis not present

## 2024-02-13 DIAGNOSIS — E440 Moderate protein-calorie malnutrition: Secondary | ICD-10-CM | POA: Diagnosis not present

## 2024-02-13 DIAGNOSIS — I10 Essential (primary) hypertension: Secondary | ICD-10-CM | POA: Diagnosis not present

## 2024-02-13 DIAGNOSIS — K551 Chronic vascular disorders of intestine: Secondary | ICD-10-CM | POA: Diagnosis not present

## 2024-02-13 DIAGNOSIS — J449 Chronic obstructive pulmonary disease, unspecified: Secondary | ICD-10-CM | POA: Diagnosis not present

## 2024-02-14 DIAGNOSIS — I10 Essential (primary) hypertension: Secondary | ICD-10-CM | POA: Diagnosis not present

## 2024-02-14 DIAGNOSIS — E440 Moderate protein-calorie malnutrition: Secondary | ICD-10-CM | POA: Diagnosis not present

## 2024-02-14 DIAGNOSIS — K551 Chronic vascular disorders of intestine: Secondary | ICD-10-CM | POA: Diagnosis not present

## 2024-02-14 DIAGNOSIS — R634 Abnormal weight loss: Secondary | ICD-10-CM | POA: Diagnosis not present

## 2024-02-14 DIAGNOSIS — I4891 Unspecified atrial fibrillation: Secondary | ICD-10-CM | POA: Diagnosis not present

## 2024-02-14 DIAGNOSIS — J449 Chronic obstructive pulmonary disease, unspecified: Secondary | ICD-10-CM | POA: Diagnosis not present

## 2024-02-15 ENCOUNTER — Encounter: Admitting: Student

## 2024-02-15 DIAGNOSIS — J449 Chronic obstructive pulmonary disease, unspecified: Secondary | ICD-10-CM | POA: Diagnosis not present

## 2024-02-15 DIAGNOSIS — I4891 Unspecified atrial fibrillation: Secondary | ICD-10-CM | POA: Diagnosis not present

## 2024-02-15 DIAGNOSIS — K551 Chronic vascular disorders of intestine: Secondary | ICD-10-CM | POA: Diagnosis not present

## 2024-02-15 DIAGNOSIS — D649 Anemia, unspecified: Secondary | ICD-10-CM | POA: Diagnosis not present

## 2024-02-15 DIAGNOSIS — J302 Other seasonal allergic rhinitis: Secondary | ICD-10-CM | POA: Diagnosis not present

## 2024-02-15 DIAGNOSIS — I10 Essential (primary) hypertension: Secondary | ICD-10-CM | POA: Diagnosis not present

## 2024-02-15 DIAGNOSIS — C9110 Chronic lymphocytic leukemia of B-cell type not having achieved remission: Secondary | ICD-10-CM | POA: Diagnosis not present

## 2024-02-15 DIAGNOSIS — R634 Abnormal weight loss: Secondary | ICD-10-CM | POA: Diagnosis not present

## 2024-02-15 DIAGNOSIS — E785 Hyperlipidemia, unspecified: Secondary | ICD-10-CM | POA: Diagnosis not present

## 2024-02-15 DIAGNOSIS — F5000 Anorexia nervosa, unspecified: Secondary | ICD-10-CM | POA: Diagnosis not present

## 2024-02-15 DIAGNOSIS — E440 Moderate protein-calorie malnutrition: Secondary | ICD-10-CM | POA: Diagnosis not present

## 2024-02-15 DIAGNOSIS — I739 Peripheral vascular disease, unspecified: Secondary | ICD-10-CM | POA: Diagnosis not present

## 2024-02-16 ENCOUNTER — Non-Acute Institutional Stay: Admitting: Nurse Practitioner

## 2024-02-16 ENCOUNTER — Encounter: Payer: Self-pay | Admitting: Nurse Practitioner

## 2024-02-16 VITALS — BP 138/62 | HR 61 | Temp 98.2°F | Ht 64.0 in | Wt 115.8 lb

## 2024-02-16 DIAGNOSIS — C9110 Chronic lymphocytic leukemia of B-cell type not having achieved remission: Secondary | ICD-10-CM | POA: Diagnosis not present

## 2024-02-16 DIAGNOSIS — K219 Gastro-esophageal reflux disease without esophagitis: Secondary | ICD-10-CM

## 2024-02-16 DIAGNOSIS — K551 Chronic vascular disorders of intestine: Secondary | ICD-10-CM | POA: Diagnosis not present

## 2024-02-16 DIAGNOSIS — E440 Moderate protein-calorie malnutrition: Secondary | ICD-10-CM

## 2024-02-16 DIAGNOSIS — I10 Essential (primary) hypertension: Secondary | ICD-10-CM | POA: Diagnosis not present

## 2024-02-16 DIAGNOSIS — I480 Paroxysmal atrial fibrillation: Secondary | ICD-10-CM | POA: Diagnosis not present

## 2024-02-16 NOTE — Progress Notes (Signed)
 Careteam: Patient Care Team: Abdul Fine, MD as PCP - General (Family Medicine) Perla Evalene PARAS, MD as PCP - Cardiology (Cardiology) Wonda Sharper, MD (Cardiology) Rennie Cindy SAUNDERS, MD as Consulting Physician (Oncology) PLACE OF SERVICE:  [facility]   Advanced Directive information    Allergies  Allergen Reactions   Triamterene  Hives    Chief Complaint  Patient presents with   Medical Management of Chronic Issues    Medical Management of Chronic Issues. 1 Month follow up. Hospice Concern. To discuss need for Flu, Tdap and Covid.      HPI: Patient is a 89 y.o. female seen in today for follow up.  Discussed the use of AI scribe software for clinical note transcription with the patient, who gave verbal consent to proceed.  History of Present Illness Tiffany Martinez is an 89 year old female with leukemia and mesenteric ischemia who presents for a one month follow-up visit.  She is currently under hospice care since August, receiving visits three times a week due to her long-standing leukemia, protein calorie malnutrition and weakness. The transition to hospice was unexpected and initially overwhelming, with multiple caregivers visiting her in one day. She is receiving home care but anticipates moving to [facility] for healthcare once the insurance waiting period completed.  She reports a history of mesenteric ischemia and describes difficulty with her diet, abdominal pain, and weight loss. She finds it challenging to consume foods due to stomach upset, even with nutritional supplements like Boost. She experienced a bout of diarrhea a week ago but has not had any since. She is not eating enough and struggles with maintaining her nutrition.  Her current medications include Protonix  (pantoprazole ) for indigestion, and her indigestion is well-controlled. She is taking Norvasc  (amlodipine ) 5 mg daily, losartan  100 mg daily, and metoprolol  12.5 mg daily for blood  pressure. She takes Eliquis  2.5 mg twice a day for a history of atrial fibrillation. She experiences frequent bruising and reports being careful to avoid falls.  She experiences weakness, particularly in the mornings, and has difficulty standing for long periods. She has not vomited in the past two weeks but continues to feel weak. She reports dry skin and bruising easily, which she attributes to her medication and condition.  Socially, she lives alone and has no family nearby. She feels isolated as her neighbors are wary of her health condition, and she has limited interaction with them. She has a trust that manages her financial affairs, which alleviates some stress. She expresses a sense of loss over her independence and the ability to engage with her community.   Review of Systems:  Review of Systems  Constitutional:  Positive for weight loss. Negative for chills and fever.  HENT:  Negative for tinnitus.   Respiratory:  Negative for cough, sputum production and shortness of breath.   Cardiovascular:  Negative for chest pain, palpitations and leg swelling.  Gastrointestinal:  Negative for abdominal pain, constipation, diarrhea and heartburn.  Genitourinary:  Negative for dysuria, frequency and urgency.  Musculoskeletal:  Negative for back pain, falls, joint pain and myalgias.  Skin: Negative.   Neurological:  Positive for weakness. Negative for dizziness and headaches.  Psychiatric/Behavioral:  Negative for depression and memory loss. The patient is nervous/anxious. The patient does not have insomnia.     Past Medical History:  Diagnosis Date   AAA (abdominal aortic aneurysm) 2012   3.9cm 2012; 2.8cm 12/2021   Anemia    Anxiety  CHF (congestive heart failure) (HCC)    LVEF 60-65% in 2023   Chronic venous insufficiency    CLL (chronic lymphocytic leukemia) (HCC) 2014   Stage 0--by flow cytometry   Hx of colonoscopy    Hyperlipidemia    Hypertension    Melanoma (HCC)    Right  Arm   Migraine headache    3-4X/yr   PAD (peripheral artery disease) 2012   severe R common iliac stenosis, ABI 0.73, mid L common iliac stenosis ABI 1.1   PAD (peripheral artery disease)    Paroxysmal atrial fibrillation (HCC) 12/2021   Wears dentures    full upper and lower   Past Surgical History:  Procedure Laterality Date   ABDOMINAL AORTIC ANEURYSM REPAIR  02/16/12   [facility]--Dr Missy   BIOPSY  06/26/2023   Procedure: BIOPSY;  Surgeon: Onita Elspeth Sharper, DO;  Location: [facility] ENDOSCOPY;  Service: Gastroenterology;;   CARDIOVASCULAR STRESS TEST  2008   negative   CATARACT EXTRACTION W/PHACO Left 07/16/2019   Procedure: CATARACT EXTRACTION PHACO AND INTRAOCULAR LENS PLACEMENT (IOC) LEFT 8.26  00:46.6;  Surgeon: Myrna Adine Anes, MD;  Location: [facility];  Service: Ophthalmology;  Laterality: Left;   COLONOSCOPY WITH PROPOFOL  N/A 06/26/2023   Procedure: COLONOSCOPY WITH PROPOFOL ;  Surgeon: Onita Elspeth Sharper, DO;  Location: [facility] ENDOSCOPY;  Service: Gastroenterology;  Laterality: N/A;   DOBUTAMINE  STRESS ECHO  11/11   negative   ESOPHAGOGASTRODUODENOSCOPY (EGD) WITH PROPOFOL  N/A 06/26/2023   Procedure: ESOPHAGOGASTRODUODENOSCOPY (EGD) WITH PROPOFOL ;  Surgeon: Onita Elspeth Sharper, DO;  Location: [facility] ENDOSCOPY;  Service: Gastroenterology;  Laterality: N/A;   HIP ARTHROPLASTY Right 10/10/2019   Procedure: ARTHROPLASTY BIPOLAR HIP (HEMIARTHROPLASTY);  Surgeon: Edie Norleen PARAS, MD;  Location: [facility] ORS;  Service: Orthopedics;  Laterality: Right;   HOT HEMOSTASIS  06/26/2023   Procedure: HOT HEMOSTASIS (ARGON PLASMA COAGULATION/BICAP);  Surgeon: Onita Elspeth Sharper, DO;  Location: [facility] ENDOSCOPY;  Service: Gastroenterology;;   KNEE ARTHROSCOPY  1998   right   MELANOMA EXCISION  11/09/10   wide excision right upper arm   SQUAMOUS CELL CARCINOMA EXCISION  02/2016   TEE WITHOUT CARDIOVERSION N/A 06/15/2023   Procedure: TRANSESOPHAGEAL ECHOCARDIOGRAM (TEE);  Surgeon: Perla Evalene PARAS, MD;  Location: [facility] ORS;  Service: Cardiovascular;  Laterality: N/A;   TONSILLECTOMY AND ADENOIDECTOMY  1960   Social History:   reports that she has been smoking cigarettes. She has never been exposed to tobacco smoke. She has never used smokeless tobacco. She reports current alcohol use of about 7.0 standard drinks of alcohol per week. She reports that she does not use drugs.  Family History  Problem Relation Age of Onset   Alzheimer's disease Mother    Pancreatic cancer Father    Parkinson's disease Brother    Coronary artery disease Neg Hx    Diabetes Neg Hx    Cancer Neg Hx        breast or colon    Medications: Patient's Medications  New Prescriptions   No medications on file  Previous Medications   AMLODIPINE  (NORVASC ) 5 MG TABLET    Take 1 tablet (5 mg total) by mouth daily.   APIXABAN  (ELIQUIS ) 2.5 MG TABS TABLET    Take 1 tablet (2.5 mg total) by mouth 2 (two) times daily.   LORATADINE  (CLARITIN ) 10 MG TABLET    Take 10 mg by mouth daily.   LOSARTAN  (COZAAR ) 100 MG TABLET    Take 1 tablet (100 mg total) by mouth daily.   METOPROLOL   SUCCINATE (TOPROL  XL) 25 MG 24 HR TABLET    Take 0.5 tablets (12.5 mg total) by mouth daily.   ONDANSETRON  (ZOFRAN -ODT) 8 MG DISINTEGRATING TABLET    Take 1 tablet (8 mg total) by mouth every 8 (eight) hours as needed for nausea or vomiting.   PANTOPRAZOLE  (PROTONIX ) 40 MG TABLET    Take 1 tablet (40 mg total) by mouth daily for 14 days.  Modified Medications   No medications on file  Discontinued Medications   No medications on file    Physical Exam:  Vitals:   02/16/24 0853  BP: 138/62  Pulse: 61  Temp: 98.2 F (36.8 C)  SpO2: 92%  Weight: 115 lb 12.8 oz (52.5 kg)  Height: 5' 4 (1.626 m)   Body mass index is 19.88 kg/m. Wt Readings from Last 3 Encounters:  02/16/24 115 lb 12.8 oz (52.5 kg)  01/04/24 115 lb 12.8 oz (52.5 kg)  12/29/23 118 lb 6.4 oz (53.7 kg)    Physical Exam Constitutional:      General: She is not in  acute distress.    Appearance: She is well-developed. She is not diaphoretic.  HENT:     Head: Normocephalic and atraumatic.     Mouth/Throat:     Pharynx: No oropharyngeal exudate.  Eyes:     Conjunctiva/sclera: Conjunctivae normal.     Pupils: Pupils are equal, round, and reactive to light.  Cardiovascular:     Rate and Rhythm: Normal rate and regular rhythm.     Heart sounds: Normal heart sounds.  Pulmonary:     Effort: Pulmonary effort is normal.     Breath sounds: Normal breath sounds.  Abdominal:     General: Bowel sounds are normal.     Palpations: Abdomen is soft.  Musculoskeletal:     Cervical back: Normal range of motion and neck supple.     Right lower leg: No edema.     Left lower leg: No edema.  Skin:    General: Skin is warm and dry.  Neurological:     Mental Status: She is alert.  Psychiatric:        Mood and Affect: Mood normal.     Labs reviewed: Basic Metabolic Panel: Recent Labs    06/13/23 0500 06/14/23 0351 06/15/23 0332 06/16/23 0427 06/22/23 1906 07/12/23 1525 08/11/23 0925 12/29/23 0805  NA 140 138 139 140   < > 137 140 139  K 3.4* 3.5 3.6 3.4*   < > 4.0 4.2 4.1  CL 112* 108 106 111   < > 106 107 108  CO2 19* 23 22 22    < > 21* 26 26  GLUCOSE 99 104* 94 92   < > 130* 101 88  BUN 24* 20 28* 27*   < > 32* 29* 20  CREATININE 1.39* 1.34* 1.65* 1.42*   < > 1.58* 1.73* 1.64*  CALCIUM  9.5 9.3 9.3 9.2   < > 9.9 9.5 10.0  MG 1.9 2.1 2.1 2.1  --   --   --   --   PHOS 2.7  --   --   --   --   --   --   --    < > = values in this interval not displayed.   Liver Function Tests: Recent Labs    06/11/23 1500 06/22/23 1906 07/11/23 0802 07/12/23 1525 08/11/23 0925 12/29/23 0805  AST 27 24   < > 22 17 20   ALT 15 17   < >  15 10 11   ALKPHOS 68 56  --  64  --   --   BILITOT 1.0 0.6   < > 0.6 0.5 0.5  PROT 6.7 6.0*   < > 6.4* 5.9* 5.6*  ALBUMIN 4.0 3.6  --  3.7  --   --    < > = values in this interval not displayed.   Recent Labs     06/22/23 1906 07/12/23 1525  LIPASE 43 42   No results for input(s): AMMONIA in the last 8760 hours. CBC: Recent Labs    07/12/23 1525 08/11/23 0925 12/29/23 0805  WBC 21.5*  21.0* 20.0* 16.4*  NEUTROABS 7.0 4,300 2,345  HGB 10.0*  10.0* 10.8* 10.3*  HCT 30.1*  29.5* 32.8* 32.0*  MCV 109.5*  109.7* 107.2* 108.5*  PLT 145*  145* 125* 98*   Lipid Panel: Recent Labs    07/11/23 0802  CHOL 172  HDL 43*  LDLCALC 107*  TRIG 120  CHOLHDL 4.0   TSH: No results for input(s): TSH in the last 8760 hours. A1C: Lab Results  Component Value Date   HGBA1C 5.4 08/02/2022     Assessment/Plan Assessment and Plan Assessment & Plan Leukemia, unspecified, not having achieved remission under hospice care Leukemia under hospice care since August. Considering transition to [facility] due to potential need - Coordinate with hospice and Child psychotherapist for potential transition to healthcare  Chronic mesenteric ischemia with protein-calorie malnutrition and weight loss Chronic mesenteric ischemia causing dietary restrictions, abdominal pain, and nutrient absorption issues leading to weight loss and malnutrition. Weight stable over last month.  Protein calorie malnutrition Weight stable in the last month.  Weakness and functional decline Significant morning weakness and difficulty standing. Hesitant to accept home assistance.  Atrial fibrillation on anticoagulation Atrial fibrillation rate managed with metoprolol  and Eliquis  for anticoagulation.   Hypertension Hypertension controlled with Norvasc , losartan , and metoprolol   Gastroesophageal reflux disease managed with pantoprazole  GERD symptoms controlled with pantoprazole . Dietary limitations due to chronic mesenteric ischemia.  Mild depression and anxiety Mild depression and anxiety present. Controlled with lifestyle modifications - discussed medication for anxiety or depression if symptoms worsen.   Odel Schmid K.  Caro BODILY  [facility] [phone]

## 2024-02-20 DIAGNOSIS — K551 Chronic vascular disorders of intestine: Secondary | ICD-10-CM | POA: Diagnosis not present

## 2024-02-20 DIAGNOSIS — I4891 Unspecified atrial fibrillation: Secondary | ICD-10-CM | POA: Diagnosis not present

## 2024-02-20 DIAGNOSIS — J449 Chronic obstructive pulmonary disease, unspecified: Secondary | ICD-10-CM | POA: Diagnosis not present

## 2024-02-20 DIAGNOSIS — E440 Moderate protein-calorie malnutrition: Secondary | ICD-10-CM | POA: Diagnosis not present

## 2024-02-20 DIAGNOSIS — R634 Abnormal weight loss: Secondary | ICD-10-CM | POA: Diagnosis not present

## 2024-02-20 DIAGNOSIS — I10 Essential (primary) hypertension: Secondary | ICD-10-CM | POA: Diagnosis not present

## 2024-02-24 DIAGNOSIS — K551 Chronic vascular disorders of intestine: Secondary | ICD-10-CM | POA: Diagnosis not present

## 2024-02-24 DIAGNOSIS — J449 Chronic obstructive pulmonary disease, unspecified: Secondary | ICD-10-CM | POA: Diagnosis not present

## 2024-02-24 DIAGNOSIS — E440 Moderate protein-calorie malnutrition: Secondary | ICD-10-CM | POA: Diagnosis not present

## 2024-02-24 DIAGNOSIS — I10 Essential (primary) hypertension: Secondary | ICD-10-CM | POA: Diagnosis not present

## 2024-02-24 DIAGNOSIS — I4891 Unspecified atrial fibrillation: Secondary | ICD-10-CM | POA: Diagnosis not present

## 2024-02-24 DIAGNOSIS — R634 Abnormal weight loss: Secondary | ICD-10-CM | POA: Diagnosis not present

## 2024-02-28 DIAGNOSIS — E440 Moderate protein-calorie malnutrition: Secondary | ICD-10-CM | POA: Diagnosis not present

## 2024-02-28 DIAGNOSIS — I10 Essential (primary) hypertension: Secondary | ICD-10-CM | POA: Diagnosis not present

## 2024-02-28 DIAGNOSIS — J449 Chronic obstructive pulmonary disease, unspecified: Secondary | ICD-10-CM | POA: Diagnosis not present

## 2024-02-28 DIAGNOSIS — K551 Chronic vascular disorders of intestine: Secondary | ICD-10-CM | POA: Diagnosis not present

## 2024-02-28 DIAGNOSIS — R634 Abnormal weight loss: Secondary | ICD-10-CM | POA: Diagnosis not present

## 2024-02-28 DIAGNOSIS — I4891 Unspecified atrial fibrillation: Secondary | ICD-10-CM | POA: Diagnosis not present

## 2024-02-29 DIAGNOSIS — I4891 Unspecified atrial fibrillation: Secondary | ICD-10-CM | POA: Diagnosis not present

## 2024-02-29 DIAGNOSIS — K551 Chronic vascular disorders of intestine: Secondary | ICD-10-CM | POA: Diagnosis not present

## 2024-02-29 DIAGNOSIS — J449 Chronic obstructive pulmonary disease, unspecified: Secondary | ICD-10-CM | POA: Diagnosis not present

## 2024-02-29 DIAGNOSIS — R634 Abnormal weight loss: Secondary | ICD-10-CM | POA: Diagnosis not present

## 2024-02-29 DIAGNOSIS — E440 Moderate protein-calorie malnutrition: Secondary | ICD-10-CM | POA: Diagnosis not present

## 2024-02-29 DIAGNOSIS — I10 Essential (primary) hypertension: Secondary | ICD-10-CM | POA: Diagnosis not present

## 2024-03-02 DIAGNOSIS — J449 Chronic obstructive pulmonary disease, unspecified: Secondary | ICD-10-CM | POA: Diagnosis not present

## 2024-03-02 DIAGNOSIS — K551 Chronic vascular disorders of intestine: Secondary | ICD-10-CM | POA: Diagnosis not present

## 2024-03-02 DIAGNOSIS — R634 Abnormal weight loss: Secondary | ICD-10-CM | POA: Diagnosis not present

## 2024-03-02 DIAGNOSIS — I4891 Unspecified atrial fibrillation: Secondary | ICD-10-CM | POA: Diagnosis not present

## 2024-03-02 DIAGNOSIS — I10 Essential (primary) hypertension: Secondary | ICD-10-CM | POA: Diagnosis not present

## 2024-03-02 DIAGNOSIS — E440 Moderate protein-calorie malnutrition: Secondary | ICD-10-CM | POA: Diagnosis not present

## 2024-03-03 DIAGNOSIS — I4891 Unspecified atrial fibrillation: Secondary | ICD-10-CM | POA: Diagnosis not present

## 2024-03-03 DIAGNOSIS — R634 Abnormal weight loss: Secondary | ICD-10-CM | POA: Diagnosis not present

## 2024-03-03 DIAGNOSIS — K551 Chronic vascular disorders of intestine: Secondary | ICD-10-CM | POA: Diagnosis not present

## 2024-03-03 DIAGNOSIS — J449 Chronic obstructive pulmonary disease, unspecified: Secondary | ICD-10-CM | POA: Diagnosis not present

## 2024-03-03 DIAGNOSIS — E440 Moderate protein-calorie malnutrition: Secondary | ICD-10-CM | POA: Diagnosis not present

## 2024-03-03 DIAGNOSIS — I10 Essential (primary) hypertension: Secondary | ICD-10-CM | POA: Diagnosis not present

## 2024-03-05 DIAGNOSIS — J449 Chronic obstructive pulmonary disease, unspecified: Secondary | ICD-10-CM | POA: Diagnosis not present

## 2024-03-05 DIAGNOSIS — I4891 Unspecified atrial fibrillation: Secondary | ICD-10-CM | POA: Diagnosis not present

## 2024-03-05 DIAGNOSIS — R634 Abnormal weight loss: Secondary | ICD-10-CM | POA: Diagnosis not present

## 2024-03-05 DIAGNOSIS — K551 Chronic vascular disorders of intestine: Secondary | ICD-10-CM | POA: Diagnosis not present

## 2024-03-05 DIAGNOSIS — E440 Moderate protein-calorie malnutrition: Secondary | ICD-10-CM | POA: Diagnosis not present

## 2024-03-05 DIAGNOSIS — I10 Essential (primary) hypertension: Secondary | ICD-10-CM | POA: Diagnosis not present

## 2024-03-07 DIAGNOSIS — R634 Abnormal weight loss: Secondary | ICD-10-CM | POA: Diagnosis not present

## 2024-03-07 DIAGNOSIS — J449 Chronic obstructive pulmonary disease, unspecified: Secondary | ICD-10-CM | POA: Diagnosis not present

## 2024-03-07 DIAGNOSIS — I10 Essential (primary) hypertension: Secondary | ICD-10-CM | POA: Diagnosis not present

## 2024-03-07 DIAGNOSIS — I4891 Unspecified atrial fibrillation: Secondary | ICD-10-CM | POA: Diagnosis not present

## 2024-03-07 DIAGNOSIS — E440 Moderate protein-calorie malnutrition: Secondary | ICD-10-CM | POA: Diagnosis not present

## 2024-03-07 DIAGNOSIS — K551 Chronic vascular disorders of intestine: Secondary | ICD-10-CM | POA: Diagnosis not present

## 2024-03-09 DIAGNOSIS — R634 Abnormal weight loss: Secondary | ICD-10-CM | POA: Diagnosis not present

## 2024-03-09 DIAGNOSIS — K551 Chronic vascular disorders of intestine: Secondary | ICD-10-CM | POA: Diagnosis not present

## 2024-03-09 DIAGNOSIS — J449 Chronic obstructive pulmonary disease, unspecified: Secondary | ICD-10-CM | POA: Diagnosis not present

## 2024-03-09 DIAGNOSIS — I4891 Unspecified atrial fibrillation: Secondary | ICD-10-CM | POA: Diagnosis not present

## 2024-03-09 DIAGNOSIS — E440 Moderate protein-calorie malnutrition: Secondary | ICD-10-CM | POA: Diagnosis not present

## 2024-03-09 DIAGNOSIS — I10 Essential (primary) hypertension: Secondary | ICD-10-CM | POA: Diagnosis not present

## 2024-03-11 DIAGNOSIS — K551 Chronic vascular disorders of intestine: Secondary | ICD-10-CM | POA: Diagnosis not present

## 2024-03-11 DIAGNOSIS — J449 Chronic obstructive pulmonary disease, unspecified: Secondary | ICD-10-CM | POA: Diagnosis not present

## 2024-03-11 DIAGNOSIS — I4891 Unspecified atrial fibrillation: Secondary | ICD-10-CM | POA: Diagnosis not present

## 2024-03-11 DIAGNOSIS — E440 Moderate protein-calorie malnutrition: Secondary | ICD-10-CM | POA: Diagnosis not present

## 2024-03-11 DIAGNOSIS — I10 Essential (primary) hypertension: Secondary | ICD-10-CM | POA: Diagnosis not present

## 2024-03-11 DIAGNOSIS — R634 Abnormal weight loss: Secondary | ICD-10-CM | POA: Diagnosis not present

## 2024-03-13 DIAGNOSIS — R634 Abnormal weight loss: Secondary | ICD-10-CM | POA: Diagnosis not present

## 2024-03-13 DIAGNOSIS — K551 Chronic vascular disorders of intestine: Secondary | ICD-10-CM | POA: Diagnosis not present

## 2024-03-13 DIAGNOSIS — I4891 Unspecified atrial fibrillation: Secondary | ICD-10-CM | POA: Diagnosis not present

## 2024-03-13 DIAGNOSIS — J449 Chronic obstructive pulmonary disease, unspecified: Secondary | ICD-10-CM | POA: Diagnosis not present

## 2024-03-13 DIAGNOSIS — E440 Moderate protein-calorie malnutrition: Secondary | ICD-10-CM | POA: Diagnosis not present

## 2024-03-13 DIAGNOSIS — I10 Essential (primary) hypertension: Secondary | ICD-10-CM | POA: Diagnosis not present

## 2024-03-14 DIAGNOSIS — I4891 Unspecified atrial fibrillation: Secondary | ICD-10-CM | POA: Diagnosis not present

## 2024-03-14 DIAGNOSIS — J449 Chronic obstructive pulmonary disease, unspecified: Secondary | ICD-10-CM | POA: Diagnosis not present

## 2024-03-14 DIAGNOSIS — E440 Moderate protein-calorie malnutrition: Secondary | ICD-10-CM | POA: Diagnosis not present

## 2024-03-14 DIAGNOSIS — K551 Chronic vascular disorders of intestine: Secondary | ICD-10-CM | POA: Diagnosis not present

## 2024-03-14 DIAGNOSIS — I10 Essential (primary) hypertension: Secondary | ICD-10-CM | POA: Diagnosis not present

## 2024-03-14 DIAGNOSIS — R634 Abnormal weight loss: Secondary | ICD-10-CM | POA: Diagnosis not present

## 2024-03-16 DIAGNOSIS — E440 Moderate protein-calorie malnutrition: Secondary | ICD-10-CM | POA: Diagnosis not present

## 2024-03-16 DIAGNOSIS — R634 Abnormal weight loss: Secondary | ICD-10-CM | POA: Diagnosis not present

## 2024-03-16 DIAGNOSIS — I10 Essential (primary) hypertension: Secondary | ICD-10-CM | POA: Diagnosis not present

## 2024-03-16 DIAGNOSIS — I4891 Unspecified atrial fibrillation: Secondary | ICD-10-CM | POA: Diagnosis not present

## 2024-03-16 DIAGNOSIS — J449 Chronic obstructive pulmonary disease, unspecified: Secondary | ICD-10-CM | POA: Diagnosis not present

## 2024-03-16 DIAGNOSIS — K551 Chronic vascular disorders of intestine: Secondary | ICD-10-CM | POA: Diagnosis not present

## 2024-03-17 DIAGNOSIS — J449 Chronic obstructive pulmonary disease, unspecified: Secondary | ICD-10-CM | POA: Diagnosis not present

## 2024-03-17 DIAGNOSIS — R634 Abnormal weight loss: Secondary | ICD-10-CM | POA: Diagnosis not present

## 2024-03-17 DIAGNOSIS — K551 Chronic vascular disorders of intestine: Secondary | ICD-10-CM | POA: Diagnosis not present

## 2024-03-17 DIAGNOSIS — I739 Peripheral vascular disease, unspecified: Secondary | ICD-10-CM | POA: Diagnosis not present

## 2024-03-17 DIAGNOSIS — I10 Essential (primary) hypertension: Secondary | ICD-10-CM | POA: Diagnosis not present

## 2024-03-17 DIAGNOSIS — F5000 Anorexia nervosa, unspecified: Secondary | ICD-10-CM | POA: Diagnosis not present

## 2024-03-17 DIAGNOSIS — J302 Other seasonal allergic rhinitis: Secondary | ICD-10-CM | POA: Diagnosis not present

## 2024-03-17 DIAGNOSIS — I4891 Unspecified atrial fibrillation: Secondary | ICD-10-CM | POA: Diagnosis not present

## 2024-03-17 DIAGNOSIS — C9110 Chronic lymphocytic leukemia of B-cell type not having achieved remission: Secondary | ICD-10-CM | POA: Diagnosis not present

## 2024-03-17 DIAGNOSIS — E785 Hyperlipidemia, unspecified: Secondary | ICD-10-CM | POA: Diagnosis not present

## 2024-03-17 DIAGNOSIS — E440 Moderate protein-calorie malnutrition: Secondary | ICD-10-CM | POA: Diagnosis not present

## 2024-03-17 DIAGNOSIS — D649 Anemia, unspecified: Secondary | ICD-10-CM | POA: Diagnosis not present

## 2024-03-19 DIAGNOSIS — R634 Abnormal weight loss: Secondary | ICD-10-CM | POA: Diagnosis not present

## 2024-03-19 DIAGNOSIS — E440 Moderate protein-calorie malnutrition: Secondary | ICD-10-CM | POA: Diagnosis not present

## 2024-03-19 DIAGNOSIS — K551 Chronic vascular disorders of intestine: Secondary | ICD-10-CM | POA: Diagnosis not present

## 2024-03-19 DIAGNOSIS — J449 Chronic obstructive pulmonary disease, unspecified: Secondary | ICD-10-CM | POA: Diagnosis not present

## 2024-03-19 DIAGNOSIS — I4891 Unspecified atrial fibrillation: Secondary | ICD-10-CM | POA: Diagnosis not present

## 2024-03-19 DIAGNOSIS — I10 Essential (primary) hypertension: Secondary | ICD-10-CM | POA: Diagnosis not present

## 2024-03-21 DIAGNOSIS — I4891 Unspecified atrial fibrillation: Secondary | ICD-10-CM | POA: Diagnosis not present

## 2024-03-21 DIAGNOSIS — J449 Chronic obstructive pulmonary disease, unspecified: Secondary | ICD-10-CM | POA: Diagnosis not present

## 2024-03-21 DIAGNOSIS — K551 Chronic vascular disorders of intestine: Secondary | ICD-10-CM | POA: Diagnosis not present

## 2024-03-21 DIAGNOSIS — I10 Essential (primary) hypertension: Secondary | ICD-10-CM | POA: Diagnosis not present

## 2024-03-21 DIAGNOSIS — R634 Abnormal weight loss: Secondary | ICD-10-CM | POA: Diagnosis not present

## 2024-03-21 DIAGNOSIS — E440 Moderate protein-calorie malnutrition: Secondary | ICD-10-CM | POA: Diagnosis not present

## 2024-03-23 DIAGNOSIS — I10 Essential (primary) hypertension: Secondary | ICD-10-CM | POA: Diagnosis not present

## 2024-03-23 DIAGNOSIS — E440 Moderate protein-calorie malnutrition: Secondary | ICD-10-CM | POA: Diagnosis not present

## 2024-03-23 DIAGNOSIS — R634 Abnormal weight loss: Secondary | ICD-10-CM | POA: Diagnosis not present

## 2024-03-23 DIAGNOSIS — K551 Chronic vascular disorders of intestine: Secondary | ICD-10-CM | POA: Diagnosis not present

## 2024-03-23 DIAGNOSIS — I4891 Unspecified atrial fibrillation: Secondary | ICD-10-CM | POA: Diagnosis not present

## 2024-03-23 DIAGNOSIS — J449 Chronic obstructive pulmonary disease, unspecified: Secondary | ICD-10-CM | POA: Diagnosis not present

## 2024-03-26 DIAGNOSIS — E440 Moderate protein-calorie malnutrition: Secondary | ICD-10-CM | POA: Diagnosis not present

## 2024-03-26 DIAGNOSIS — J449 Chronic obstructive pulmonary disease, unspecified: Secondary | ICD-10-CM | POA: Diagnosis not present

## 2024-03-26 DIAGNOSIS — I4891 Unspecified atrial fibrillation: Secondary | ICD-10-CM | POA: Diagnosis not present

## 2024-03-26 DIAGNOSIS — K551 Chronic vascular disorders of intestine: Secondary | ICD-10-CM | POA: Diagnosis not present

## 2024-03-26 DIAGNOSIS — I10 Essential (primary) hypertension: Secondary | ICD-10-CM | POA: Diagnosis not present

## 2024-03-26 DIAGNOSIS — R634 Abnormal weight loss: Secondary | ICD-10-CM | POA: Diagnosis not present

## 2024-03-28 DIAGNOSIS — K551 Chronic vascular disorders of intestine: Secondary | ICD-10-CM | POA: Diagnosis not present

## 2024-03-28 DIAGNOSIS — R634 Abnormal weight loss: Secondary | ICD-10-CM | POA: Diagnosis not present

## 2024-03-28 DIAGNOSIS — I4891 Unspecified atrial fibrillation: Secondary | ICD-10-CM | POA: Diagnosis not present

## 2024-03-28 DIAGNOSIS — I10 Essential (primary) hypertension: Secondary | ICD-10-CM | POA: Diagnosis not present

## 2024-03-28 DIAGNOSIS — J449 Chronic obstructive pulmonary disease, unspecified: Secondary | ICD-10-CM | POA: Diagnosis not present

## 2024-03-28 DIAGNOSIS — E440 Moderate protein-calorie malnutrition: Secondary | ICD-10-CM | POA: Diagnosis not present

## 2024-03-29 DIAGNOSIS — I4891 Unspecified atrial fibrillation: Secondary | ICD-10-CM | POA: Diagnosis not present

## 2024-03-29 DIAGNOSIS — E440 Moderate protein-calorie malnutrition: Secondary | ICD-10-CM | POA: Diagnosis not present

## 2024-03-29 DIAGNOSIS — R634 Abnormal weight loss: Secondary | ICD-10-CM | POA: Diagnosis not present

## 2024-03-29 DIAGNOSIS — I10 Essential (primary) hypertension: Secondary | ICD-10-CM | POA: Diagnosis not present

## 2024-03-29 DIAGNOSIS — J449 Chronic obstructive pulmonary disease, unspecified: Secondary | ICD-10-CM | POA: Diagnosis not present

## 2024-03-29 DIAGNOSIS — K551 Chronic vascular disorders of intestine: Secondary | ICD-10-CM | POA: Diagnosis not present

## 2024-03-30 DIAGNOSIS — I10 Essential (primary) hypertension: Secondary | ICD-10-CM | POA: Diagnosis not present

## 2024-03-30 DIAGNOSIS — K551 Chronic vascular disorders of intestine: Secondary | ICD-10-CM | POA: Diagnosis not present

## 2024-03-30 DIAGNOSIS — J449 Chronic obstructive pulmonary disease, unspecified: Secondary | ICD-10-CM | POA: Diagnosis not present

## 2024-03-30 DIAGNOSIS — E440 Moderate protein-calorie malnutrition: Secondary | ICD-10-CM | POA: Diagnosis not present

## 2024-03-30 DIAGNOSIS — R634 Abnormal weight loss: Secondary | ICD-10-CM | POA: Diagnosis not present

## 2024-03-30 DIAGNOSIS — I4891 Unspecified atrial fibrillation: Secondary | ICD-10-CM | POA: Diagnosis not present

## 2024-04-03 DIAGNOSIS — I4891 Unspecified atrial fibrillation: Secondary | ICD-10-CM | POA: Diagnosis not present

## 2024-04-03 DIAGNOSIS — R634 Abnormal weight loss: Secondary | ICD-10-CM | POA: Diagnosis not present

## 2024-04-03 DIAGNOSIS — E440 Moderate protein-calorie malnutrition: Secondary | ICD-10-CM | POA: Diagnosis not present

## 2024-04-03 DIAGNOSIS — J449 Chronic obstructive pulmonary disease, unspecified: Secondary | ICD-10-CM | POA: Diagnosis not present

## 2024-04-03 DIAGNOSIS — I10 Essential (primary) hypertension: Secondary | ICD-10-CM | POA: Diagnosis not present

## 2024-04-03 DIAGNOSIS — K551 Chronic vascular disorders of intestine: Secondary | ICD-10-CM | POA: Diagnosis not present

## 2024-04-04 DIAGNOSIS — R634 Abnormal weight loss: Secondary | ICD-10-CM | POA: Diagnosis not present

## 2024-04-04 DIAGNOSIS — J449 Chronic obstructive pulmonary disease, unspecified: Secondary | ICD-10-CM | POA: Diagnosis not present

## 2024-04-04 DIAGNOSIS — K551 Chronic vascular disorders of intestine: Secondary | ICD-10-CM | POA: Diagnosis not present

## 2024-04-04 DIAGNOSIS — E440 Moderate protein-calorie malnutrition: Secondary | ICD-10-CM | POA: Diagnosis not present

## 2024-04-04 DIAGNOSIS — I4891 Unspecified atrial fibrillation: Secondary | ICD-10-CM | POA: Diagnosis not present

## 2024-04-04 DIAGNOSIS — I10 Essential (primary) hypertension: Secondary | ICD-10-CM | POA: Diagnosis not present

## 2024-04-06 DIAGNOSIS — J449 Chronic obstructive pulmonary disease, unspecified: Secondary | ICD-10-CM | POA: Diagnosis not present

## 2024-04-06 DIAGNOSIS — E440 Moderate protein-calorie malnutrition: Secondary | ICD-10-CM | POA: Diagnosis not present

## 2024-04-06 DIAGNOSIS — I4891 Unspecified atrial fibrillation: Secondary | ICD-10-CM | POA: Diagnosis not present

## 2024-04-06 DIAGNOSIS — R634 Abnormal weight loss: Secondary | ICD-10-CM | POA: Diagnosis not present

## 2024-04-06 DIAGNOSIS — K551 Chronic vascular disorders of intestine: Secondary | ICD-10-CM | POA: Diagnosis not present

## 2024-04-06 DIAGNOSIS — I10 Essential (primary) hypertension: Secondary | ICD-10-CM | POA: Diagnosis not present

## 2024-04-07 DIAGNOSIS — I10 Essential (primary) hypertension: Secondary | ICD-10-CM | POA: Diagnosis not present

## 2024-04-07 DIAGNOSIS — K551 Chronic vascular disorders of intestine: Secondary | ICD-10-CM | POA: Diagnosis not present

## 2024-04-07 DIAGNOSIS — J449 Chronic obstructive pulmonary disease, unspecified: Secondary | ICD-10-CM | POA: Diagnosis not present

## 2024-04-07 DIAGNOSIS — R634 Abnormal weight loss: Secondary | ICD-10-CM | POA: Diagnosis not present

## 2024-04-07 DIAGNOSIS — E440 Moderate protein-calorie malnutrition: Secondary | ICD-10-CM | POA: Diagnosis not present

## 2024-04-07 DIAGNOSIS — I4891 Unspecified atrial fibrillation: Secondary | ICD-10-CM | POA: Diagnosis not present

## 2024-04-09 DIAGNOSIS — I10 Essential (primary) hypertension: Secondary | ICD-10-CM | POA: Diagnosis not present

## 2024-04-09 DIAGNOSIS — R634 Abnormal weight loss: Secondary | ICD-10-CM | POA: Diagnosis not present

## 2024-04-09 DIAGNOSIS — I4891 Unspecified atrial fibrillation: Secondary | ICD-10-CM | POA: Diagnosis not present

## 2024-04-09 DIAGNOSIS — K551 Chronic vascular disorders of intestine: Secondary | ICD-10-CM | POA: Diagnosis not present

## 2024-04-09 DIAGNOSIS — J449 Chronic obstructive pulmonary disease, unspecified: Secondary | ICD-10-CM | POA: Diagnosis not present

## 2024-04-09 DIAGNOSIS — E440 Moderate protein-calorie malnutrition: Secondary | ICD-10-CM | POA: Diagnosis not present

## 2024-04-11 DIAGNOSIS — J449 Chronic obstructive pulmonary disease, unspecified: Secondary | ICD-10-CM | POA: Diagnosis not present

## 2024-04-11 DIAGNOSIS — R634 Abnormal weight loss: Secondary | ICD-10-CM | POA: Diagnosis not present

## 2024-04-11 DIAGNOSIS — I10 Essential (primary) hypertension: Secondary | ICD-10-CM | POA: Diagnosis not present

## 2024-04-11 DIAGNOSIS — I4891 Unspecified atrial fibrillation: Secondary | ICD-10-CM | POA: Diagnosis not present

## 2024-04-11 DIAGNOSIS — K551 Chronic vascular disorders of intestine: Secondary | ICD-10-CM | POA: Diagnosis not present

## 2024-04-11 DIAGNOSIS — E440 Moderate protein-calorie malnutrition: Secondary | ICD-10-CM | POA: Diagnosis not present

## 2024-04-13 DIAGNOSIS — I10 Essential (primary) hypertension: Secondary | ICD-10-CM | POA: Diagnosis not present

## 2024-04-13 DIAGNOSIS — K551 Chronic vascular disorders of intestine: Secondary | ICD-10-CM | POA: Diagnosis not present

## 2024-04-13 DIAGNOSIS — I4891 Unspecified atrial fibrillation: Secondary | ICD-10-CM | POA: Diagnosis not present

## 2024-04-13 DIAGNOSIS — J449 Chronic obstructive pulmonary disease, unspecified: Secondary | ICD-10-CM | POA: Diagnosis not present

## 2024-04-13 DIAGNOSIS — E440 Moderate protein-calorie malnutrition: Secondary | ICD-10-CM | POA: Diagnosis not present

## 2024-04-13 DIAGNOSIS — R634 Abnormal weight loss: Secondary | ICD-10-CM | POA: Diagnosis not present

## 2024-04-15 DIAGNOSIS — I10 Essential (primary) hypertension: Secondary | ICD-10-CM | POA: Diagnosis not present

## 2024-04-15 DIAGNOSIS — R634 Abnormal weight loss: Secondary | ICD-10-CM | POA: Diagnosis not present

## 2024-04-15 DIAGNOSIS — I4891 Unspecified atrial fibrillation: Secondary | ICD-10-CM | POA: Diagnosis not present

## 2024-04-15 DIAGNOSIS — K551 Chronic vascular disorders of intestine: Secondary | ICD-10-CM | POA: Diagnosis not present

## 2024-04-15 DIAGNOSIS — J449 Chronic obstructive pulmonary disease, unspecified: Secondary | ICD-10-CM | POA: Diagnosis not present

## 2024-04-15 DIAGNOSIS — E440 Moderate protein-calorie malnutrition: Secondary | ICD-10-CM | POA: Diagnosis not present

## 2024-05-08 ENCOUNTER — Encounter: Admitting: Internal Medicine
